# Patient Record
Sex: Male | Born: 1950 | Race: White | Hispanic: No | Marital: Married | Smoking: Former smoker
Health system: Southern US, Community
[De-identification: ages and names within clinical notes are randomized; demographics above are authoritative.]

## PROBLEM LIST (undated history)

## (undated) DIAGNOSIS — S12090A Other displaced fracture of first cervical vertebra, initial encounter for closed fracture: Secondary | ICD-10-CM

## (undated) DIAGNOSIS — K567 Ileus, unspecified: Secondary | ICD-10-CM

## (undated) DIAGNOSIS — G250 Essential tremor: Secondary | ICD-10-CM

## (undated) DIAGNOSIS — Z96649 Presence of unspecified artificial hip joint: Secondary | ICD-10-CM

## (undated) HISTORY — PX: JOINT REPLACEMENT: SHX530

## (undated) HISTORY — PX: MOUTH SURGERY: SHX715

## (undated) HISTORY — PX: COLONOSCOPY W/ POLYPECTOMY: SHX1380

## (undated) HISTORY — PX: APPENDECTOMY: SHX54

---

## 2007-06-21 ENCOUNTER — Inpatient Hospital Stay: Payer: Self-pay | Admitting: Surgery

## 2010-12-17 ENCOUNTER — Ambulatory Visit: Payer: Self-pay | Admitting: Internal Medicine

## 2011-02-04 ENCOUNTER — Ambulatory Visit: Payer: Self-pay | Admitting: Gastroenterology

## 2014-01-11 ENCOUNTER — Other Ambulatory Visit: Payer: Self-pay | Admitting: Neurosurgery

## 2014-01-11 DIAGNOSIS — S32009A Unspecified fracture of unspecified lumbar vertebra, initial encounter for closed fracture: Secondary | ICD-10-CM

## 2014-01-14 ENCOUNTER — Ambulatory Visit
Admission: RE | Admit: 2014-01-14 | Discharge: 2014-01-14 | Disposition: A | Discharge status: Home / Self Care | Payer: 59 | Source: Ambulatory Visit | Attending: Neurosurgery | Admitting: Neurosurgery

## 2014-01-14 DIAGNOSIS — S32009A Unspecified fracture of unspecified lumbar vertebra, initial encounter for closed fracture: Secondary | ICD-10-CM

## 2017-02-13 ENCOUNTER — Encounter (INDEPENDENT_AMBULATORY_CARE_PROVIDER_SITE_OTHER): Payer: Medicare Other | Admitting: Ophthalmology

## 2017-02-13 DIAGNOSIS — H2513 Age-related nuclear cataract, bilateral: Secondary | ICD-10-CM | POA: Diagnosis not present

## 2017-02-13 DIAGNOSIS — H35341 Macular cyst, hole, or pseudohole, right eye: Secondary | ICD-10-CM | POA: Diagnosis not present

## 2017-02-13 DIAGNOSIS — H33303 Unspecified retinal break, bilateral: Secondary | ICD-10-CM | POA: Diagnosis not present

## 2017-02-13 DIAGNOSIS — H43813 Vitreous degeneration, bilateral: Secondary | ICD-10-CM

## 2017-02-13 DIAGNOSIS — H35411 Lattice degeneration of retina, right eye: Secondary | ICD-10-CM | POA: Diagnosis not present

## 2017-03-03 ENCOUNTER — Encounter (INDEPENDENT_AMBULATORY_CARE_PROVIDER_SITE_OTHER): Payer: Medicare Other | Admitting: Ophthalmology

## 2017-03-03 DIAGNOSIS — H33301 Unspecified retinal break, right eye: Secondary | ICD-10-CM

## 2017-03-03 NOTE — H&P (Addendum)
Nicolas GallusRichard C Solis is an 66 y.o. male.   Chief Complaint: Loss of vision right eye  HPI:  Loss of vision 6 weeks ago in right eye  Retinal break left eye  No past medical history on file.  No past surgical history on file.  No family history on file. Social History:  has no tobacco, alcohol, and drug history on file.  Allergies: Allergies not on file  No prescriptions prior to admission.    Review of systems otherwise negative  There were no vitals taken for this visit.  Physical exam: Mental status: oriented x3. Eyes: See eye exam associated with this date of surgery in media tab.  Scanned in by scanning center Ears, Nose, Throat: within normal limits Neck: Within Normal limits General: within normal limits Chest: Within normal limits Breast: deferred Heart: Within normal limits Abdomen: Within normal limits GU: deferred Extremities: within normal limits Skin: within normal limits  Assessment/Plan Diagnosis:  Macular hole right eye.  Retinal break left eye  [facility]  Laser for retinal break left eye.  Pars plana vitrectomy, laser, membrane peel, serum patch, gas injection right eye Nicolas GeorgeMATTHEWS, Nicolas Solis 03/03/2017, 4:57 PM

## 2017-03-17 ENCOUNTER — Encounter (HOSPITAL_COMMUNITY): Payer: Self-pay | Admitting: *Deleted

## 2017-03-17 MED ORDER — CEFAZOLIN SODIUM-DEXTROSE 2-4 GM/100ML-% IV SOLN
2.0000 g | INTRAVENOUS | Status: AC
  Administered 2017-03-18: 2 g via INTRAVENOUS
  Filled 2017-03-17 (×2): qty 100

## 2017-03-18 ENCOUNTER — Ambulatory Visit (HOSPITAL_COMMUNITY): Payer: Medicare Other | Admitting: Certified Registered"

## 2017-03-18 ENCOUNTER — Ambulatory Visit (HOSPITAL_COMMUNITY)
Admission: RE | Admit: 2017-03-18 | Discharge: 2017-03-19 | Disposition: A | Discharge status: Home / Self Care | Payer: Medicare Other | Source: Ambulatory Visit | Attending: Ophthalmology | Admitting: Ophthalmology

## 2017-03-18 ENCOUNTER — Encounter (HOSPITAL_COMMUNITY)
Admission: RE | Disposition: A | Discharge status: Home / Self Care | Payer: Self-pay | Source: Ambulatory Visit | Attending: Ophthalmology

## 2017-03-18 ENCOUNTER — Encounter (HOSPITAL_COMMUNITY): Payer: Self-pay | Admitting: *Deleted

## 2017-03-18 DIAGNOSIS — H35341 Macular cyst, hole, or pseudohole, right eye: Secondary | ICD-10-CM | POA: Diagnosis present

## 2017-03-18 DIAGNOSIS — H33303 Unspecified retinal break, bilateral: Secondary | ICD-10-CM | POA: Diagnosis not present

## 2017-03-18 HISTORY — PX: GAS/FLUID EXCHANGE: SHX5334

## 2017-03-18 HISTORY — PX: GAS INSERTION: SHX5336

## 2017-03-18 HISTORY — DX: Other displaced fracture of first cervical vertebra, initial encounter for closed fracture: S12.090A

## 2017-03-18 HISTORY — PX: 25 GAUGE PARS PLANA VITRECTOMY WITH 20 GAUGE MVR PORT: SHX6041

## 2017-03-18 HISTORY — PX: PHOTOCOAGULATION: SHX5303

## 2017-03-18 HISTORY — DX: Essential tremor: G25.0

## 2017-03-18 HISTORY — PX: MEMBRANE PEEL: SHX5967

## 2017-03-18 HISTORY — DX: Ileus, unspecified: K56.7

## 2017-03-18 HISTORY — PX: 25 GAUGE PARS PLANA VITRECTOMY WITH 20 GAUGE MVR PORT FOR MACULAR HOLE: SHX6096

## 2017-03-18 HISTORY — PX: SERUM PATCH: SHX6091

## 2017-03-18 LAB — CBC
HEMATOCRIT: 43.1 % (ref 39.0–52.0)
HEMOGLOBIN: 14.2 g/dL (ref 13.0–17.0)
MCH: 33.9 pg (ref 26.0–34.0)
MCHC: 32.9 g/dL (ref 30.0–36.0)
MCV: 102.9 fL — ABNORMAL HIGH (ref 78.0–100.0)
Platelets: 138 10*3/uL — ABNORMAL LOW (ref 150–400)
RBC: 4.19 MIL/uL — AB (ref 4.22–5.81)
RDW: 13.2 % (ref 11.5–15.5)
WBC: 4.6 10*3/uL (ref 4.0–10.5)

## 2017-03-18 LAB — BASIC METABOLIC PANEL
ANION GAP: 6 (ref 5–15)
BUN: 10 mg/dL (ref 6–20)
CHLORIDE: 105 mmol/L (ref 101–111)
CO2: 26 mmol/L (ref 22–32)
CREATININE: 0.68 mg/dL (ref 0.61–1.24)
Calcium: 9.2 mg/dL (ref 8.9–10.3)
GFR calc non Af Amer: >60 mL/min (ref >60)
Glucose, Bld: 114 mg/dL — ABNORMAL HIGH (ref 65–99)
POTASSIUM: 4.1 mmol/L (ref 3.5–5.1)
Sodium: 137 mmol/L (ref 135–145)

## 2017-03-18 LAB — AUTOLOGOUS SERUM PATCH PREP

## 2017-03-18 SURGERY — 25 GAUGE PARS PLANA VITRECTOMY WITH 20 GAUGE MVR PORT FOR MACULAR HOLE
Anesthesia: General | Site: Eye | Laterality: Right

## 2017-03-18 MED ORDER — MIDAZOLAM HCL 5 MG/5ML IJ SOLN
INTRAMUSCULAR | Status: DC | PRN
  Administered 2017-03-18: 2 mg via INTRAVENOUS

## 2017-03-18 MED ORDER — HYDROMORPHONE HCL 1 MG/ML IJ SOLN: 0.2500 mg | INTRAMUSCULAR | Status: DC | PRN

## 2017-03-18 MED ORDER — SUGAMMADEX SODIUM 200 MG/2ML IV SOLN
INTRAVENOUS | Status: AC
  Filled 2017-03-18: qty 2

## 2017-03-18 MED ORDER — MAGNESIUM HYDROXIDE 400 MG/5ML PO SUSP: 15.0000 mL | Freq: Four times a day (QID) | ORAL | Status: DC | PRN

## 2017-03-18 MED ORDER — BUPIVACAINE HCL (PF) 0.75 % IJ SOLN
INTRAMUSCULAR | Status: DC | PRN
  Administered 2017-03-18: 10 mL

## 2017-03-18 MED ORDER — ONDANSETRON HCL 4 MG/2ML IJ SOLN: 4.0000 mg | Freq: Four times a day (QID) | INTRAMUSCULAR | Status: DC | PRN

## 2017-03-18 MED ORDER — BACITRACIN-POLYMYXIN B 500-10000 UNIT/GM OP OINT
TOPICAL_OINTMENT | OPHTHALMIC | Status: AC
  Filled 2017-03-18: qty 3.5

## 2017-03-18 MED ORDER — 0.9 % SODIUM CHLORIDE (POUR BTL) OPTIME
TOPICAL | Status: DC | PRN
  Administered 2017-03-18: 200 mL

## 2017-03-18 MED ORDER — DEXAMETHASONE SODIUM PHOSPHATE 10 MG/ML IJ SOLN
INTRAMUSCULAR | Status: DC | PRN
  Administered 2017-03-18: 10 mg

## 2017-03-18 MED ORDER — TROPICAMIDE 1 % OP SOLN: 1.0000 [drp] | OPHTHALMIC | Status: DC | PRN

## 2017-03-18 MED ORDER — ONDANSETRON HCL 4 MG/2ML IJ SOLN
INTRAMUSCULAR | Status: AC
  Filled 2017-03-18: qty 2

## 2017-03-18 MED ORDER — ACETAZOLAMIDE SODIUM 500 MG IJ SOLR
500.0000 mg | Freq: Once | INTRAMUSCULAR | Status: AC
  Administered 2017-03-19: 500 mg via INTRAVENOUS
  Filled 2017-03-18: qty 500

## 2017-03-18 MED ORDER — MORPHINE SULFATE (PF) 4 MG/ML IV SOLN
1.0000 mg | INTRAVENOUS | Status: DC | PRN
Start: 2017-03-18 — End: 2017-03-19

## 2017-03-18 MED ORDER — TEMAZEPAM 15 MG PO CAPS
15.0000 mg | ORAL_CAPSULE | Freq: Every evening | ORAL | Status: DC | PRN
  Administered 2017-03-18: 15 mg via ORAL
  Filled 2017-03-18: qty 1

## 2017-03-18 MED ORDER — HEMOSTATIC AGENTS (NO CHARGE) OPTIME
TOPICAL | Status: DC | PRN
  Administered 2017-03-18: 1 via TOPICAL

## 2017-03-18 MED ORDER — BUPIVACAINE HCL (PF) 0.75 % IJ SOLN
INTRAMUSCULAR | Status: AC
  Filled 2017-03-18: qty 10

## 2017-03-18 MED ORDER — STERILE WATER FOR INJECTION IJ SOLN
INTRAMUSCULAR | Status: AC
  Filled 2017-03-18: qty 20

## 2017-03-18 MED ORDER — PHENYLEPHRINE HCL 2.5 % OP SOLN
1.0000 [drp] | OPHTHALMIC | Status: AC
  Administered 2017-03-18 (×3): 1 [drp] via OPHTHALMIC
  Filled 2017-03-18: qty 2

## 2017-03-18 MED ORDER — BSS IO SOLN
INTRAOCULAR | Status: AC
  Filled 2017-03-18: qty 15

## 2017-03-18 MED ORDER — EPHEDRINE SULFATE-NACL 50-0.9 MG/10ML-% IV SOSY
PREFILLED_SYRINGE | INTRAVENOUS | Status: DC | PRN
  Administered 2017-03-18 (×2): 10 mg via INTRAVENOUS

## 2017-03-18 MED ORDER — EPINEPHRINE PF 1 MG/ML IJ SOLN
INTRAMUSCULAR | Status: AC
  Filled 2017-03-18: qty 1

## 2017-03-18 MED ORDER — MIDAZOLAM HCL 2 MG/2ML IJ SOLN
INTRAMUSCULAR | Status: AC
  Filled 2017-03-18: qty 2

## 2017-03-18 MED ORDER — TRIAMCINOLONE ACETONIDE 40 MG/ML IJ SUSP
INTRAMUSCULAR | Status: AC
  Filled 2017-03-18: qty 5

## 2017-03-18 MED ORDER — SODIUM CHLORIDE 0.9 % IJ SOLN
INTRAMUSCULAR | Status: AC
  Filled 2017-03-18: qty 10

## 2017-03-18 MED ORDER — ATROPINE SULFATE 1 % OP SOLN
OPHTHALMIC | Status: DC | PRN
  Administered 2017-03-18: 1 [drp] via OPHTHALMIC

## 2017-03-18 MED ORDER — EPINEPHRINE PF 1 MG/ML IJ SOLN
INTRAOCULAR | Status: DC | PRN
  Administered 2017-03-18: 11:00:00

## 2017-03-18 MED ORDER — LIDOCAINE 2% (20 MG/ML) 5 ML SYRINGE
INTRAMUSCULAR | Status: DC | PRN
  Administered 2017-03-18: 80 mg via INTRAVENOUS

## 2017-03-18 MED ORDER — SODIUM CHLORIDE 0.45 % IV SOLN
INTRAVENOUS | Status: DC
  Administered 2017-03-18: 1 mL via INTRAVENOUS

## 2017-03-18 MED ORDER — ROCURONIUM BROMIDE 10 MG/ML (PF) SYRINGE
PREFILLED_SYRINGE | INTRAVENOUS | Status: DC | PRN
  Administered 2017-03-18: 40 mg via INTRAVENOUS

## 2017-03-18 MED ORDER — CEFTAZIDIME 1 G IJ SOLR
INTRAMUSCULAR | Status: AC
  Filled 2017-03-18: qty 1

## 2017-03-18 MED ORDER — TETRACAINE HCL 0.5 % OP SOLN
2.0000 [drp] | Freq: Once | OPHTHALMIC | Status: DC
  Filled 2017-03-18: qty 4

## 2017-03-18 MED ORDER — CYCLOPENTOLATE HCL 1 % OP SOLN
1.0000 [drp] | OPHTHALMIC | Status: AC
  Administered 2017-03-18 (×3): 1 [drp] via OPHTHALMIC
  Filled 2017-03-18: qty 2

## 2017-03-18 MED ORDER — ATROPINE SULFATE 1 % OP SOLN
OPHTHALMIC | Status: AC
  Filled 2017-03-18: qty 5

## 2017-03-18 MED ORDER — STERILE WATER FOR IRRIGATION IR SOLN
Status: DC | PRN
  Administered 2017-03-18: 200 mL

## 2017-03-18 MED ORDER — FENTANYL CITRATE (PF) 250 MCG/5ML IJ SOLN
INTRAMUSCULAR | Status: DC | PRN
  Administered 2017-03-18: 100 ug via INTRAVENOUS

## 2017-03-18 MED ORDER — PHENYLEPHRINE 40 MCG/ML (10ML) SYRINGE FOR IV PUSH (FOR BLOOD PRESSURE SUPPORT)
PREFILLED_SYRINGE | INTRAVENOUS | Status: DC | PRN
  Administered 2017-03-18: 80 ug via INTRAVENOUS
  Administered 2017-03-18: 120 ug via INTRAVENOUS
  Administered 2017-03-18 (×3): 80 ug via INTRAVENOUS
  Administered 2017-03-18: 120 ug via INTRAVENOUS
  Administered 2017-03-18: 80 ug via INTRAVENOUS

## 2017-03-18 MED ORDER — PREDNISOLONE ACETATE 1 % OP SUSP
1.0000 [drp] | Freq: Four times a day (QID) | OPHTHALMIC | Status: DC
  Filled 2017-03-18: qty 5

## 2017-03-18 MED ORDER — DEXAMETHASONE SODIUM PHOSPHATE 10 MG/ML IJ SOLN
INTRAMUSCULAR | Status: AC
  Filled 2017-03-18: qty 1

## 2017-03-18 MED ORDER — ROCURONIUM BROMIDE 10 MG/ML (PF) SYRINGE
PREFILLED_SYRINGE | INTRAVENOUS | Status: AC
  Filled 2017-03-18: qty 5

## 2017-03-18 MED ORDER — BRIMONIDINE TARTRATE 0.2 % OP SOLN
1.0000 [drp] | Freq: Two times a day (BID) | OPHTHALMIC | Status: DC
  Filled 2017-03-18: qty 5

## 2017-03-18 MED ORDER — PROPOFOL 10 MG/ML IV BOLUS
INTRAVENOUS | Status: AC
  Filled 2017-03-18: qty 20

## 2017-03-18 MED ORDER — PROPOFOL 10 MG/ML IV BOLUS
INTRAVENOUS | Status: DC | PRN
  Administered 2017-03-18: 150 mg via INTRAVENOUS

## 2017-03-18 MED ORDER — GATIFLOXACIN 0.5 % OP SOLN
1.0000 [drp] | OPHTHALMIC | Status: AC
  Administered 2017-03-18 (×3): 1 [drp] via OPHTHALMIC
  Filled 2017-03-18: qty 2.5

## 2017-03-18 MED ORDER — BSS PLUS IO SOLN
INTRAOCULAR | Status: AC
  Filled 2017-03-18: qty 500

## 2017-03-18 MED ORDER — FENTANYL CITRATE (PF) 250 MCG/5ML IJ SOLN
INTRAMUSCULAR | Status: AC
  Filled 2017-03-18: qty 5

## 2017-03-18 MED ORDER — POLYMYXIN B SULFATE 500000 UNITS IJ SOLR
INTRAMUSCULAR | Status: AC
  Filled 2017-03-18: qty 500000

## 2017-03-18 MED ORDER — LATANOPROST 0.005 % OP SOLN
1.0000 [drp] | Freq: Every day | OPHTHALMIC | Status: DC
  Administered 2017-03-18: 1 [drp] via OPHTHALMIC
  Filled 2017-03-18: qty 2.5

## 2017-03-18 MED ORDER — SODIUM HYALURONATE 10 MG/ML IO SOLN
INTRAOCULAR | Status: AC
  Filled 2017-03-18: qty 0.85

## 2017-03-18 MED ORDER — GATIFLOXACIN 0.5 % OP SOLN: 1.0000 [drp] | OPHTHALMIC | Status: DC | PRN

## 2017-03-18 MED ORDER — BSS PLUS IO SOLN: INTRAOCULAR | Status: DC | PRN

## 2017-03-18 MED ORDER — CYCLOPENTOLATE HCL 1 % OP SOLN: 1.0000 [drp] | OPHTHALMIC | Status: DC | PRN

## 2017-03-18 MED ORDER — IBUPROFEN 600 MG PO TABS: 600.0000 mg | ORAL_TABLET | Freq: Every day | ORAL | Status: DC | PRN

## 2017-03-18 MED ORDER — LIDOCAINE 2% (20 MG/ML) 5 ML SYRINGE
INTRAMUSCULAR | Status: AC
  Filled 2017-03-18: qty 5

## 2017-03-18 MED ORDER — SODIUM HYALURONATE 10 MG/ML IO SOLN
INTRAOCULAR | Status: DC | PRN
  Administered 2017-03-18: 0.85 mL via INTRAOCULAR

## 2017-03-18 MED ORDER — ONDANSETRON HCL 4 MG/2ML IJ SOLN
INTRAMUSCULAR | Status: DC | PRN
  Administered 2017-03-18: 4 mg via INTRAVENOUS

## 2017-03-18 MED ORDER — PHENYLEPHRINE 40 MCG/ML (10ML) SYRINGE FOR IV PUSH (FOR BLOOD PRESSURE SUPPORT)
PREFILLED_SYRINGE | INTRAVENOUS | Status: AC
Start: 2017-03-18 — End: ?
  Filled 2017-03-18: qty 10

## 2017-03-18 MED ORDER — BACITRACIN-POLYMYXIN B 500-10000 UNIT/GM OP OINT
1.0000 "application " | TOPICAL_OINTMENT | Freq: Three times a day (TID) | OPHTHALMIC | Status: DC
  Filled 2017-03-18: qty 3.5

## 2017-03-18 MED ORDER — DORZOLAMIDE HCL 2 % OP SOLN
1.0000 [drp] | Freq: Three times a day (TID) | OPHTHALMIC | Status: DC
  Administered 2017-03-19: 1 [drp] via OPHTHALMIC
  Filled 2017-03-18: qty 10

## 2017-03-18 MED ORDER — BACITRACIN-POLYMYXIN B 500-10000 UNIT/GM OP OINT
TOPICAL_OINTMENT | OPHTHALMIC | Status: DC | PRN
  Administered 2017-03-18: 1 via OPHTHALMIC

## 2017-03-18 MED ORDER — STERILE WATER FOR INJECTION IJ SOLN
INTRAMUSCULAR | Status: DC | PRN
  Administered 2017-03-18: 20 mL

## 2017-03-18 MED ORDER — HYDROCODONE-ACETAMINOPHEN 5-325 MG PO TABS: 1.0000 | ORAL_TABLET | ORAL | Status: DC | PRN

## 2017-03-18 MED ORDER — GATIFLOXACIN 0.5 % OP SOLN
1.0000 [drp] | Freq: Four times a day (QID) | OPHTHALMIC | Status: DC
  Filled 2017-03-18: qty 2.5

## 2017-03-18 MED ORDER — ACETAMINOPHEN 325 MG PO TABS: 325.0000 mg | ORAL_TABLET | ORAL | Status: DC | PRN

## 2017-03-18 MED ORDER — PRIMIDONE 50 MG PO TABS
50.0000 mg | ORAL_TABLET | Freq: Every evening | ORAL | Status: DC
  Administered 2017-03-18: 50 mg via ORAL
  Filled 2017-03-18: qty 1

## 2017-03-18 MED ORDER — LATANOPROST 0.005 % OP SOLN
1.0000 [drp] | Freq: Every day | OPHTHALMIC | Status: DC
  Administered 2017-03-19: 1 [drp] via OPHTHALMIC
  Filled 2017-03-18: qty 2.5

## 2017-03-18 MED ORDER — SODIUM CHLORIDE 0.9 % IV SOLN
INTRAVENOUS | Status: DC
  Administered 2017-03-18: 10:00:00 via INTRAVENOUS

## 2017-03-18 MED ORDER — TROPICAMIDE 1 % OP SOLN
1.0000 [drp] | OPHTHALMIC | Status: AC
  Administered 2017-03-18 – 2017-03-19 (×4): 1 [drp] via OPHTHALMIC
  Filled 2017-03-18: qty 15

## 2017-03-18 MED ORDER — PHENYLEPHRINE HCL 2.5 % OP SOLN: 1.0000 [drp] | OPHTHALMIC | Status: DC | PRN

## 2017-03-18 MED ORDER — SUGAMMADEX SODIUM 200 MG/2ML IV SOLN
INTRAVENOUS | Status: DC | PRN
  Administered 2017-03-18: 200 mg via INTRAVENOUS

## 2017-03-18 SURGICAL SUPPLY — 63 items
BIPOLAR FORCEP ×4 IMPLANT
BLADE EYE CATARACT 19 1.4 BEAV (BLADE) IMPLANT
BLADE MVR KNIFE 19G (BLADE) IMPLANT
BLADE MVR KNIFE 20G (BLADE) ×4 IMPLANT
CANNULA VLV SOFT TIP 25GA (OPHTHALMIC) ×4 IMPLANT
CORDS BIPOLAR (ELECTRODE) ×4 IMPLANT
COTTONBALL LRG STERILE PKG (GAUZE/BANDAGES/DRESSINGS) ×12 IMPLANT
COVER MAYO STAND STRL (DRAPES) IMPLANT
DRAPE INCISE 51X51 W/FILM STRL (DRAPES) IMPLANT
DRAPE OPHTHALMIC 77X100 STRL (CUSTOM PROCEDURE TRAY) ×4 IMPLANT
ERASER HMR WETFIELD 23G BP (MISCELLANEOUS) ×4 IMPLANT
FILTER BLUE MILLIPORE (MISCELLANEOUS) IMPLANT
FILTER STRAW FLUID ASPIR (MISCELLANEOUS) ×4 IMPLANT
FORCEPS GRIESHABER ILM 25G A (INSTRUMENTS) IMPLANT
GAS AUTO FILL CONSTEL (OPHTHALMIC) ×4
GAS AUTO FILL CONSTELLATION (OPHTHALMIC) ×2 IMPLANT
GAS OPHTHALMIC (MISCELLANEOUS) ×4 IMPLANT
GLOVE SS BIOGEL STRL SZ 6.5 (GLOVE) ×2 IMPLANT
GLOVE SS BIOGEL STRL SZ 7 (GLOVE) ×2 IMPLANT
GLOVE SUPERSENSE BIOGEL SZ 6.5 (GLOVE) ×2
GLOVE SUPERSENSE BIOGEL SZ 7 (GLOVE) ×2
GLOVE SURG 8.5 LATEX PF (GLOVE) ×4 IMPLANT
GLOVE SURG SS PI 6.0 STRL IVOR (GLOVE) ×8 IMPLANT
GOWN STRL REUS W/ TWL LRG LVL3 (GOWN DISPOSABLE) ×6 IMPLANT
GOWN STRL REUS W/TWL LRG LVL3 (GOWN DISPOSABLE) ×6
HANDLE PNEUMATIC FOR CONSTEL (OPHTHALMIC) IMPLANT
KIT BASIN OR (CUSTOM PROCEDURE TRAY) ×4 IMPLANT
KNIFE GRIESHABER SHARP 2.5MM (MISCELLANEOUS) IMPLANT
MICROPICK 25G (MISCELLANEOUS)
NEEDLE 18GX1X1/2 (RX/OR ONLY) (NEEDLE) ×4 IMPLANT
NEEDLE 25GX 5/8IN NON SAFETY (NEEDLE) ×4 IMPLANT
NEEDLE FILTER BLUNT 18X 1/2SAF (NEEDLE) ×2
NEEDLE FILTER BLUNT 18X1 1/2 (NEEDLE) ×2 IMPLANT
NEEDLE HYPO 30X.5 LL (NEEDLE) IMPLANT
NS IRRIG 1000ML POUR BTL (IV SOLUTION) ×4 IMPLANT
PACK FRAGMATOME (OPHTHALMIC) IMPLANT
PACK VITRECTOMY CUSTOM (CUSTOM PROCEDURE TRAY) ×4 IMPLANT
PAD ARMBOARD 7.5X6 YLW CONV (MISCELLANEOUS) ×8 IMPLANT
PAK PIK VITRECTOMY CVS 25GA (OPHTHALMIC) ×4 IMPLANT
PIC ILLUMINATED 25G (OPHTHALMIC) ×4
PICK MICROPICK 25G (MISCELLANEOUS) IMPLANT
PIK ILLUMINATED 25G (OPHTHALMIC) ×2 IMPLANT
PROBE LASER ILLUM FLEX CVD 25G (OPHTHALMIC) IMPLANT
REPL STRA BRUSH NEEDLE (NEEDLE) ×4 IMPLANT
RESERVOIR BACK FLUSH (MISCELLANEOUS) ×4 IMPLANT
ROLLS DENTAL (MISCELLANEOUS) ×8 IMPLANT
SCRAPER DIAMOND 25GA (OPHTHALMIC RELATED) ×4 IMPLANT
SCRAPER DIAMOND DUST MEMBRANE (MISCELLANEOUS) ×4 IMPLANT
SPONGE SURGIFOAM ABS GEL 12-7 (HEMOSTASIS) ×4 IMPLANT
STOPCOCK 4 WAY LG BORE MALE ST (IV SETS) IMPLANT
SUT CHROMIC 7 0 TG140 8 (SUTURE) IMPLANT
SUT ETHILON 9 0 TG140 8 (SUTURE) ×4 IMPLANT
SUT POLY NON ABSORB 10-0 8 STR (SUTURE) IMPLANT
SUT SILK 4 0 RB 1 (SUTURE) IMPLANT
SUT VICRYL 7 0 TG140 8 (SUTURE) ×4 IMPLANT
SYR 10ML LL (SYRINGE) ×4 IMPLANT
SYR 20CC LL (SYRINGE) IMPLANT
SYR 5ML LL (SYRINGE) IMPLANT
SYR BULB 3OZ (MISCELLANEOUS) ×4 IMPLANT
SYR TB 1ML LUER SLIP (SYRINGE) ×4 IMPLANT
TUBING HIGH PRESS EXTEN 6IN (TUBING) IMPLANT
WATER STERILE IRR 1000ML POUR (IV SOLUTION) ×4 IMPLANT
WIPE INSTRUMENT VISIWIPE 73X73 (MISCELLANEOUS) IMPLANT

## 2017-03-18 NOTE — Anesthesia Preprocedure Evaluation (Addendum)
Anesthesia Evaluation  Patient identified by MRN, date of birth, ID band Patient awake    Reviewed: Allergy & Precautions, NPO status , Patient's Chart, lab work & pertinent test results  Airway Mallampati: II  TM Distance: >3 FB     Dental   Pulmonary former smoker,    breath sounds clear to auscultation       Cardiovascular negative cardio ROS   Rhythm:Regular Rate:Normal     Neuro/Psych    GI/Hepatic negative GI ROS, Neg liver ROS,   Endo/Other  negative endocrine ROS  Renal/GU negative Renal ROS  negative genitourinary   Musculoskeletal   Abdominal   Peds  Hematology   Anesthesia Other Findings   Reproductive/Obstetrics                             Anesthesia Physical Anesthesia Plan  ASA: III  Anesthesia Plan: General   Post-op Pain Management:    Induction: Intravenous  PONV Risk Score and Plan: 2 and Ondansetron, Dexamethasone, Propofol infusion, Midazolam and Treatment may vary due to age or medical condition  Airway Management Planned: Simple Face Mask  Additional Equipment:   Intra-op Plan:   Post-operative Plan: Extubation in OR  Informed Consent: I have reviewed the patients History and Physical, chart, labs and discussed the procedure including the risks, benefits and alternatives for the proposed anesthesia with the patient or authorized representative who has indicated his/her understanding and acceptance.   Dental advisory given  Plan Discussed with: CRNA and Anesthesiologist  Anesthesia Plan Comments:         Anesthesia Quick Evaluation

## 2017-03-18 NOTE — Brief Op Note (Signed)
Brief Operative note   Preoperative diagnosis:  macular hole right eye retinal break left eye Postoperative diagnosis  Same  Procedures: Laser for retinal breaks OU.  Pars plana vitrectomy, membrane peel, laser, gas injection, serum patch  Right eye  Surgeon:  Sherrie George, MD...  Assistant:  Rosalie Doctor SA    Anesthesia: General  Specimen: none  Estimated blood loss:  1cc  Complications: none  Patient sent to PACU in good condition  Composed by Sherrie George MD  Dictation number: [id number]

## 2017-03-18 NOTE — H&P (Signed)
I examined the patient today and there is no change in the medical status 

## 2017-03-18 NOTE — Op Note (Signed)
NAME:  Nicolas Solis, Nicolas Solis                 ACCOUNT NO.:  MEDICAL RECORD NO.:  [id number]  LOCATION:                                 FACILITY:  PHYSICIAN:  Jarett Dralle D. Ashley Royalty, M.D.      DATE OF BIRTH:  DATE OF PROCEDURE:  03/18/2017 DATE OF DISCHARGE:                              OPERATIVE REPORT   ADMISSION DIAGNOSES:  Retinal break with subretinal fluid, left eye. Retinal break with subretinal fluid, right eye.  Macular hole, right eye.  PROCEDURES:  Laser for retinal break, left eye; laser for retinal break, right eye; pars plana vitrectomy, membrane peel, gas-fluid exchange, serum patch, retinal photocoagulation, all in the right eye.  SURGEON:  Beulah Gandy. Ashley Royalty, M.D.  ASSISTANT:  Rosalie Doctor, SA.  ANESTHESIA:  General.  DETAILS:  After proper endotracheal anesthesia, attention was carried to the left eye.  The 962 burns were placed around the retinal periphery around areas of retinal breaks with some subretinal fluid.  These areas were primarily at 12 o'clock and at 1 o'clock and at 2 o'clock.  The power was between 500 and 780 mW, 1000 microns each and 0.1 seconds each.  The eye was treated with ointment and closed.  It was covered. Attention was carried to the right eye.  Indirect ophthalmoscope laser was moved into place.  The 957 burns were placed around the retinal periphery.  An area of shallow detachment was seen superiorly and 957 burns were placed around the retinal breaks and in 2 rows of laser for 360 degrees.  Attention was carried to the pars plana where the pars plana vitrectomy was begun.  A 25-gauge trocars at 8 o'clock and 10 o'clock.  A 3-layered scleral incision was made with a 20-gauge MVR at 2 o'clock.  Contact lens ring anchored into place at 6 and 12 o'clock. Provisc was placed on the corneal surface and the flat contact.  The 25- gauge trocars were placed at 8 and 10 o'clock, infusion at 8 o'clock. The pars plana vitrectomy was begun just behind the  crystalline lens. Vitreous membranes were encountered.  These were carefully removed under low suction and rapid cutting.  The vitrectomy was carried posteriorly in a core fashion down to the macular surface.  The vitrectomy was carried into the mid periphery with the 30 degree prismatic lens for viewing.  The flat lens was repositioned on the eye.  The silicone tip suction line was brought into the vitreous cavity down to the macular surface.  The fish strike sign occurred.  The edge of the macular hole elevated and the membrane was stripped from its attachment to the edge of the macular hole.  The additional vitrectomy was carried into the mid periphery and then into the far periphery with the 30-degree prismatic contact lens viewing system.  Once this was accomplished, the Provisc was repositioned on the eye and the magnifying contact lens was placed. The diamond-dusted membrane scraper, 20-gauge was inserted into the eye and drawn down to the macular surface.  The internal limiting membrane was removed with the diamond-dusted membrane scraper.  The edges of the macular hole were freed and mobile.  Once all membranes were  removed, vitreous cutter was repositioned in the eye and all remnants of vitreous were removed.  A total gas-fluid exchange was carried out.  Sufficient time was allowed to pass, so that fluid would track down the walls of the eye collecting the posterior segment.  During this time, a serum patch was prepared and C3F8 was prepared to a 14% concentration.  The additional fluid was removed with the [place] Ophthalmic brush until the posterior segment was totally dry.  Serum patch was delivered. Additional serum was removed with a [place] Ophthalmic brush.  C3F8 14% was exchanged for intravitreal gas.  The instruments were removed from the eye.  A 9-0 nylon was used to close the sclerotomy site at 2 o'clock.  Conjunctiva was closed with wet-field cautery.  The  25-gauge trocars were removed.  The wounds were tested and found to be secure. Polymyxin and ceftazidime were rinsed around the globe for antibiotic coverage.  Decadron 10 mg was injected into the lower subconjunctival space.  Atropine solution was applied.  Marcaine was injected around the globe for postop pain.  Closing pressure was 15 with a [place] tonometer.  Polysporin ophthalmic ointment, a patch and shield were placed.  The patient was awakened, taken to recovery in satisfactory condition.  DURATION:  Two hours.     Beulah Gandy. Ashley Royalty, M.D.     JDM/MEDQ  D:  03/18/2017  T:  03/18/2017  Job:  [id number]

## 2017-03-18 NOTE — Progress Notes (Signed)
Pt was instructed to lay down with his face down provided the right pillow "u shape" but the pt refused, he said his wife will bring his own pillow from home, assisted pt going to the bathroom for safety.

## 2017-03-18 NOTE — Anesthesia Procedure Notes (Signed)
Procedure Name: Intubation Date/Time: 03/18/2017 11:42 AM Performed by: Myna Bright Pre-anesthesia Checklist: Patient identified, Emergency Drugs available, Suction available and Patient being monitored Patient Re-evaluated:Patient Re-evaluated prior to induction Oxygen Delivery Method: Circle system utilized Preoxygenation: Pre-oxygenation with 100% oxygen Induction Type: IV induction Ventilation: Mask ventilation without difficulty Laryngoscope Size: Mac and 4 Grade View: Grade I Tube type: Oral Tube size: 7.5 mm Number of attempts: 1 Airway Equipment and Method: Stylet Placement Confirmation: ETT inserted through vocal cords under direct vision,  positive ETCO2 and breath sounds checked- equal and bilateral Secured at: 22 cm Tube secured with: Tape Dental Injury: Teeth and Oropharynx as per pre-operative assessment

## 2017-03-18 NOTE — Anesthesia Postprocedure Evaluation (Signed)
Anesthesia Post Note  Patient: Nicolas Solis  Procedure(s) Performed: Procedure(s) (LRB): 25 GAUGE PARS PLANA VITRECTOMY WITH 20 GAUGE MVR PORT FOR MACULAR HOLE; PHOTOCOAGULATION RIGHT EYE (Right) PHOTOCOAGULATION LEFT EYE (Left) SERUM PATCH RIGHT EYE (Right) MEMBRANE PEEL RIGHT EYE (Right) GAS/FLUID EXCHANGE RIGHT EYE (Right) INSERTION OF GAS RIGHT (C3F8) (Right)     Patient location during evaluation: PACU Anesthesia Type: General Level of consciousness: awake Pain management: pain level controlled Vital Signs Assessment: post-procedure vital signs reviewed and stable Respiratory status: spontaneous breathing Cardiovascular status: stable Postop Assessment: no signs of nausea or vomiting Anesthetic complications: no    Last Vitals:  Vitals:   03/18/17 1349 03/18/17 1404  BP:  132/79  Pulse: 75 71  Resp: 18 18  Temp: (!) 36.1 C (!) 36.3 C  SpO2: 99% 96%    Last Pain:  Vitals:   03/18/17 1404  TempSrc: Oral                 Jarrick Fjeld

## 2017-03-18 NOTE — Progress Notes (Signed)
Pt new admit from PACU s/p Pars plana Vitrectomy Rt eye with eye shield, alert and oriented with Lt hand peripheral IV line, voided, right eye dressing dry and intact, wife at the bedside.

## 2017-03-18 NOTE — Progress Notes (Signed)
Pt sit at the edge of the bed  leaning the side table face down because he said he is not comfortable lying down in the bed, wife at the bedside discussed the importance of the right position as per MD instruction.

## 2017-03-18 NOTE — Transfer of Care (Signed)
Immediate Anesthesia Transfer of Care Note  Patient: Nicolas Solis  Procedure(s) Performed: Procedure(s): 25 GAUGE PARS PLANA VITRECTOMY WITH 20 GAUGE MVR PORT FOR MACULAR HOLE (Right) PHOTOCOAGULATION (Left)  Patient Location: PACU  Anesthesia Type:General  Level of Consciousness: awake, alert , oriented and patient cooperative  Airway & Oxygen Therapy: Patient Spontanous Breathing and Patient connected to nasal cannula oxygen  Post-op Assessment: Report given to RN and Post -op Vital signs reviewed and stable  Post vital signs: Reviewed and stable  Last Vitals:  Vitals:   03/18/17 0900 03/18/17 1311  BP: 122/70   Pulse: 73   Resp: 18   Temp: 36.7 C (!) 36.2 C  SpO2: 100%     Last Pain:  Vitals:   03/18/17 0900  TempSrc: Oral         Complications: No apparent anesthesia complications

## 2017-03-19 ENCOUNTER — Encounter (HOSPITAL_COMMUNITY): Payer: Self-pay | Admitting: Ophthalmology

## 2017-03-19 DIAGNOSIS — H35341 Macular cyst, hole, or pseudohole, right eye: Secondary | ICD-10-CM | POA: Diagnosis not present

## 2017-03-19 MED ORDER — GATIFLOXACIN 0.5 % OP SOLN: 1.0000 [drp] | Freq: Four times a day (QID) | OPHTHALMIC | Status: DC

## 2017-03-19 MED ORDER — BACITRACIN-POLYMYXIN B 500-10000 UNIT/GM OP OINT: 1.0000 "application " | TOPICAL_OINTMENT | Freq: Three times a day (TID) | OPHTHALMIC | 0 refills | Status: DC

## 2017-03-19 MED ORDER — PREDNISOLONE ACETATE 1 % OP SUSP: 1.0000 [drp] | Freq: Four times a day (QID) | OPHTHALMIC | 0 refills | Status: DC

## 2017-03-19 MED ORDER — BRIMONIDINE TARTRATE 0.2 % OP SOLN: 1.0000 [drp] | Freq: Two times a day (BID) | OPHTHALMIC | 12 refills | Status: DC

## 2017-03-19 MED ORDER — HYDROCODONE-ACETAMINOPHEN 5-325 MG PO TABS: 1.0000 | ORAL_TABLET | ORAL | 0 refills | Status: DC | PRN

## 2017-03-19 MED ORDER — DIPHENHYDRAMINE HCL 25 MG PO CAPS
25.0000 mg | ORAL_CAPSULE | Freq: Once | ORAL | Status: AC
  Administered 2017-03-19: 25 mg via ORAL
  Filled 2017-03-19: qty 1

## 2017-03-19 NOTE — Progress Notes (Signed)
03/19/2017, 6:44 AM  Mental Status:  Awake, Alert, Oriented  Anterior segment: Cornea  Clear    Anterior Chamber Clear    Lens:   Clear, IOL, Cataract  Intra Ocular Pressure 45 mmHg with Tonopen  (treated, see below)  Vitreous: Clear 99%gas bubble  Retina:  Attached Good laser reaction   Impression: Excellent result Retina attached  Final Diagnosis: Active Problems:   Macular hole, right eye   Plan:  Increased IOP at bedside.  Sterile tap of vitreous with betadine, moxiflox, tetracaine.  Used insulin needle and syringe.  IOP reduced to .  Dorzolomide, Xalatan, Alphagan given OD.  start post operative eye drops.  Add Alphagan.   Discharge to home.  Give post operative instructions  Sherrie George 03/19/2017, 6:44 AM

## 2017-03-19 NOTE — Discharge Summary (Signed)
Discharge summary not needed on [facility] patients per medical records. 

## 2017-03-19 NOTE — Progress Notes (Signed)
Discharge teaching complete. Meds, diet, follow up appointments and eye care reviewed and all questions answered. Wife at bedside. Copy of instructions given to patient along with prescription.

## 2017-03-25 ENCOUNTER — Encounter (INDEPENDENT_AMBULATORY_CARE_PROVIDER_SITE_OTHER): Payer: Medicare Other | Admitting: Ophthalmology

## 2017-03-25 DIAGNOSIS — H35341 Macular cyst, hole, or pseudohole, right eye: Secondary | ICD-10-CM

## 2017-03-25 DIAGNOSIS — H33302 Unspecified retinal break, left eye: Secondary | ICD-10-CM

## 2017-04-02 ENCOUNTER — Encounter (INDEPENDENT_AMBULATORY_CARE_PROVIDER_SITE_OTHER): Payer: Medicare Other | Admitting: Ophthalmology

## 2017-04-02 DIAGNOSIS — H35341 Macular cyst, hole, or pseudohole, right eye: Secondary | ICD-10-CM

## 2017-04-17 ENCOUNTER — Encounter (INDEPENDENT_AMBULATORY_CARE_PROVIDER_SITE_OTHER): Payer: Medicare Other | Admitting: Ophthalmology

## 2017-04-17 DIAGNOSIS — H35341 Macular cyst, hole, or pseudohole, right eye: Secondary | ICD-10-CM

## 2017-06-26 ENCOUNTER — Encounter (INDEPENDENT_AMBULATORY_CARE_PROVIDER_SITE_OTHER): Payer: Medicare Other | Admitting: Ophthalmology

## 2017-06-26 DIAGNOSIS — H35372 Puckering of macula, left eye: Secondary | ICD-10-CM

## 2017-06-26 DIAGNOSIS — H43812 Vitreous degeneration, left eye: Secondary | ICD-10-CM | POA: Diagnosis not present

## 2017-06-26 DIAGNOSIS — H33303 Unspecified retinal break, bilateral: Secondary | ICD-10-CM

## 2017-06-26 DIAGNOSIS — H35341 Macular cyst, hole, or pseudohole, right eye: Secondary | ICD-10-CM

## 2017-09-25 ENCOUNTER — Encounter (INDEPENDENT_AMBULATORY_CARE_PROVIDER_SITE_OTHER): Payer: Medicare Other | Admitting: Ophthalmology

## 2017-09-25 DIAGNOSIS — H33303 Unspecified retinal break, bilateral: Secondary | ICD-10-CM

## 2017-09-25 DIAGNOSIS — H43812 Vitreous degeneration, left eye: Secondary | ICD-10-CM

## 2017-09-25 DIAGNOSIS — H35372 Puckering of macula, left eye: Secondary | ICD-10-CM

## 2017-09-25 DIAGNOSIS — H35341 Macular cyst, hole, or pseudohole, right eye: Secondary | ICD-10-CM

## 2017-09-25 DIAGNOSIS — H2513 Age-related nuclear cataract, bilateral: Secondary | ICD-10-CM | POA: Diagnosis not present

## 2018-09-25 ENCOUNTER — Encounter (INDEPENDENT_AMBULATORY_CARE_PROVIDER_SITE_OTHER): Payer: Medicare Other | Admitting: Ophthalmology

## 2019-09-18 ENCOUNTER — Ambulatory Visit: Payer: Medicare Other | Attending: Internal Medicine

## 2019-09-18 DIAGNOSIS — Z23 Encounter for immunization: Secondary | ICD-10-CM | POA: Insufficient documentation

## 2019-09-18 NOTE — Progress Notes (Signed)
   Covid-19 Vaccination Clinic  Name:  Nicolas Solis    MRN: [id number] DOB: [birth year]  09/18/2019  Mr. Nicolas Solis was observed post Covid-19 immunization for 15 minutes without incidence. He was provided with Vaccine Information Sheet and instruction to access the V-Safe system.   Mr. Nicolas Solis was instructed to call 911 with any severe reactions post vaccine: Marland Kitchen Difficulty breathing  . Swelling of your face and throat  . A fast heartbeat  . A bad rash all over your body  . Dizziness and weakness    Immunizations Administered    Name Date Dose VIS Date Route   Pfizer COVID-19 Vaccine 09/18/2019 12:31 PM 0.3 mL 07/09/2019 Intramuscular   Manufacturer: [employer], [employer]   Lot: [id number]   NDC: [address]-8

## 2019-10-12 ENCOUNTER — Ambulatory Visit: Payer: Medicare Other | Attending: Internal Medicine

## 2019-10-12 DIAGNOSIS — Z23 Encounter for immunization: Secondary | ICD-10-CM

## 2019-10-12 NOTE — Progress Notes (Signed)
   Covid-19 Vaccination Clinic  Name:  CASSIDY TABET    MRN: [id number] DOB: 05/22/1951  10/12/2019  Mr. Boley was observed post Covid-19 immunization for 15 minutes without incident. He was provided with Vaccine Information Sheet and instruction to access the V-Safe system.   Mr. Pugh was instructed to call 911 with any severe reactions post vaccine: Marland Kitchen Difficulty breathing  . Swelling of face and throat  . A fast heartbeat  . A bad rash all over body  . Dizziness and weakness   Immunizations Administered    Name Date Dose VIS Date Route   Pfizer COVID-19 Vaccine 10/12/2019 11:28 AM 0.3 mL 07/09/2019 Intramuscular   Manufacturer: [employer], [employer]   Lot: [id number]   NDC: [address]-9

## 2020-06-02 ENCOUNTER — Other Ambulatory Visit: Payer: Self-pay | Admitting: Neurology

## 2020-06-02 ENCOUNTER — Other Ambulatory Visit (HOSPITAL_COMMUNITY): Payer: Self-pay | Admitting: Neurology

## 2020-06-02 DIAGNOSIS — G20 Parkinson's disease: Secondary | ICD-10-CM

## 2020-06-08 ENCOUNTER — Ambulatory Visit (HOSPITAL_COMMUNITY)
Admission: RE | Admit: 2020-06-08 | Discharge: 2020-06-08 | Disposition: A | Discharge status: Home / Self Care | Payer: Medicare Other | Source: Ambulatory Visit | Attending: Neurology | Admitting: Neurology

## 2020-06-08 ENCOUNTER — Other Ambulatory Visit: Payer: Self-pay

## 2020-06-08 ENCOUNTER — Other Ambulatory Visit (HOSPITAL_COMMUNITY): Payer: Self-pay | Admitting: Neurology

## 2020-06-08 DIAGNOSIS — G20 Parkinson's disease: Secondary | ICD-10-CM

## 2020-09-25 ENCOUNTER — Other Ambulatory Visit: Payer: Self-pay | Admitting: Internal Medicine

## 2020-09-25 DIAGNOSIS — Z136 Encounter for screening for cardiovascular disorders: Secondary | ICD-10-CM

## 2020-09-25 DIAGNOSIS — I700 Atherosclerosis of aorta: Secondary | ICD-10-CM

## 2020-09-29 ENCOUNTER — Ambulatory Visit: Admission: RE | Admit: 2020-09-29 | Payer: Medicare Other | Source: Ambulatory Visit

## 2020-10-11 ENCOUNTER — Ambulatory Visit
Admission: RE | Admit: 2020-10-11 | Discharge: 2020-10-11 | Disposition: A | Discharge status: Home / Self Care | Payer: Medicare Other | Source: Ambulatory Visit | Attending: Internal Medicine | Admitting: Internal Medicine

## 2020-10-11 ENCOUNTER — Other Ambulatory Visit: Payer: Self-pay

## 2020-10-11 DIAGNOSIS — I700 Atherosclerosis of aorta: Secondary | ICD-10-CM | POA: Diagnosis present

## 2020-10-11 DIAGNOSIS — Z136 Encounter for screening for cardiovascular disorders: Secondary | ICD-10-CM | POA: Diagnosis present

## 2020-11-27 ENCOUNTER — Ambulatory Visit: Payer: Medicare Other

## 2020-11-27 ENCOUNTER — Other Ambulatory Visit: Payer: Self-pay

## 2020-11-27 ENCOUNTER — Ambulatory Visit: Payer: Medicare Other | Attending: Neurology | Admitting: Speech Pathology

## 2020-11-27 DIAGNOSIS — R279 Unspecified lack of coordination: Secondary | ICD-10-CM | POA: Insufficient documentation

## 2020-11-27 DIAGNOSIS — G20 Parkinson's disease: Secondary | ICD-10-CM | POA: Diagnosis present

## 2020-11-27 DIAGNOSIS — R2689 Other abnormalities of gait and mobility: Secondary | ICD-10-CM | POA: Insufficient documentation

## 2020-11-27 DIAGNOSIS — R2681 Unsteadiness on feet: Secondary | ICD-10-CM

## 2020-11-27 DIAGNOSIS — R471 Dysarthria and anarthria: Secondary | ICD-10-CM | POA: Insufficient documentation

## 2020-11-27 NOTE — Therapy (Signed)
[facility] MAIN [facility] SERVICES [address] [place], [place], [address] Phone: [phone]   Fax:  [phone]  Physical Therapy Evaluation  Patient Details  Name: Nicolas Solis MRN: [id number] Date of Birth: 03/01/1951 Referring Provider (PT): Cristopher Peru   Encounter Date: 11/27/2020   PT End of Session - 11/27/20 1325    Visit Number 1    Number of Visits 17    Date for PT Re-Evaluation 01/01/21    PT Start Time 1100    PT Stop Time 1159    PT Time Calculation (min) 59 min    Equipment Utilized During Treatment Gait belt    Activity Tolerance Patient tolerated treatment well    Behavior During Therapy WFL for tasks assessed/performed           Past Medical History:  Diagnosis Date  . Compression fracture of first cervical vertebra (HCC)   . Essential tremor   . Ileus [facility])     Past Surgical History:  Procedure Laterality Date  . 25 GAUGE PARS PLANA VITRECTOMY WITH 20 GAUGE MVR PORT Right 03/18/2017  . 25 GAUGE PARS PLANA VITRECTOMY WITH 20 GAUGE MVR PORT FOR MACULAR HOLE Right 03/18/2017   Procedure: 25 GAUGE PARS PLANA VITRECTOMY WITH 20 GAUGE MVR PORT FOR MACULAR HOLE; PHOTOCOAGULATION RIGHT EYE;  Surgeon: Sherrie George, MD;  Location: [facility] OR;  Service: Ophthalmology;  Laterality: Right;  . APPENDECTOMY    . COLONOSCOPY W/ POLYPECTOMY    . GAS INSERTION Right 03/18/2017   Procedure: INSERTION OF GAS RIGHT (C3F8);  Surgeon: Sherrie George, MD;  Location: [facility] OR;  Service: Ophthalmology;  Laterality: Right;  . GAS/FLUID EXCHANGE Right 03/18/2017   Procedure: GAS/FLUID EXCHANGE RIGHT EYE;  Surgeon: Sherrie George, MD;  Location: [facility] OR;  Service: Ophthalmology;  Laterality: Right;  . JOINT REPLACEMENT Right    Hip  . MEMBRANE PEEL Right 03/18/2017   Procedure: MEMBRANE PEEL RIGHT EYE;  Surgeon: Sherrie George, MD;  Location: [facility] OR;  Service: Ophthalmology;  Laterality: Right;  . MOUTH SURGERY    . PHOTOCOAGULATION Left  03/18/2017   Procedure: PHOTOCOAGULATION LEFT EYE;  Surgeon: Sherrie George, MD;  Location: [facility] OR;  Service: Ophthalmology;  Laterality: Left;  . SERUM PATCH Right 03/18/2017   Procedure: SERUM PATCH RIGHT EYE;  Surgeon: Sherrie George, MD;  Location: [facility] OR;  Service: Ophthalmology;  Laterality: Right;    There were no vitals filed for this visit.    Subjective Assessment - 11/27/20 1139    Subjective Patient is a pleasant 70 year old male who presents for LSVT eval.    Patient is accompained by: Family member    Pertinent History Patient is a 70 year old male who presents for LSVT BIG and LOUD evaluation. Patient recently diagnosed with Parkinson's disease (05/2020) and is Sinemet. He has increased urinary frequency and nocturia as well as low back pain with ambulation. Cogwheeling with his R more than L is noted per neurologist. Was doing [employer] in [place].  Has been using a cane since 2017 and now is on a walker. PMH includes L1 vertebral compression fx, AAA, anemia, essential tremor.    Limitations Lifting;Standing;Walking;House hold activities;Other (comment)    How long can you sit comfortably? R hip pain    How long can you stand comfortably? haven't tried: needs to hold on 2 minutes    How long can you walk comfortably? 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    Patient Stated Goals to be able to walk better    Currently in Pain? Yes    Pain Score 2     Pain Location Hip    Pain Orientation Right    Pain Descriptors / Indicators Aching    Pain Type Chronic pain    Pain Onset More than a month ago    Pain Frequency Intermittent    Aggravating Factors  walking,    Pain Relieving Factors resting              [facility] PT Assessment - 11/27/20 0001      Assessment   Medical Diagnosis Parkinsons    Referring Provider (PT) Sherryll Burger,  [place]    Onset Date/Surgical Date --   05/2020   Hand Dominance Right    Prior Therapy no      Precautions   Precautions Fall      Restrictions   Weight Bearing Restrictions No      Balance Screen   Has the patient fallen in the past 6 months Yes    How many times? 1    Has the patient had a decrease in activity level because of a fear of falling?  Yes    Is the patient reluctant to leave their home because of a fear of falling?  Yes      Home Environment   Living Environment Private residence    Living Arrangements Spouse/significant other    Available Help at Discharge Family    Type of Home House    Home Access Stairs to enter    Entrance Stairs-Number of Steps 5    Entrance Stairs-Rails Right;Left    Home Layout Two level    Home Equipment [place] - single point;Walker - 2 wheels;Walker - 4 wheels;Shower seat      Prior Function   Level of Independence Independent    Vocation Retired   Futures trader   Leisure watch tv movies, words with friends, puzzles      Observation/Other Assessments   Focus on Therapeutic Outcomes (FOTO)  40      Sensation   Light Touch Appears Intact      Coordination   Gross Motor Movements are Fluid and Coordinated No    Fine Motor Movements are Fluid and Coordinated No    Coordination and Movement Description cogwheel and dyskinetic    Finger Nose Finger Test past pointing dyskinetic      Posture/Postural Control   Posture/Postural Control Postural limitations    Postural Limitations Rounded Shoulders;Forward head;Flexed trunk      Ambulation/Gait   Gait Pattern Decreased arm swing - right;Decreased arm swing - left;Decreased step length - right;Decreased step length - left;Decreased stride length;Shuffle;Trunk flexed;Poor foot clearance - left;Poor foot clearance - right      Standardized Balance Assessment   Standardized Balance Assessment Berg Balance Test      Berg Balance Test   Sit to Stand Able to stand using hands after  several tries    Standing Unsupported Able to stand 2 minutes with supervision    Sitting with Back Unsupported but Feet Supported on Floor or Stool Able to sit safely and securely 2 minutes    Stand to Sit Controls descent by using hands    Transfers Able to transfer safely, definite need of hands    Standing Unsupported with Eyes  Closed Able to stand 3 seconds    Standing Unsupported with Feet Together Able to place feet together independently but unable to hold for 30 seconds    From Standing, Reach Forward with Outstretched Arm Can reach forward >5 cm safely (2")    From Standing Position, Pick up Object from Floor Able to pick up shoe, needs supervision    From Standing Position, Turn to Look Behind Over each Shoulder Needs supervision when turning    Turn 360 Degrees Able to turn 360 degrees safely but slowly    Standing Unsupported, Alternately Place Feet on Step/Stool Needs assistance to keep from falling or unable to try    Standing Unsupported, One Foot in Front Able to take small step independently and hold 30 seconds    Standing on One Leg Unable to try or needs assist to prevent fall    Total Score 29              Neurological and Other Medical Information: What were your initial symptoms of Parkinson disease? Tremors in bilateral hands  Do you have tremors?  Yes x  No____  If yes, please describe: bilateral hands with L more impaired than right  Do you have any other medical problem? Yes ___x__ No ____ If yes, please describe AAA, L 2 vertebral compression, anemia  Do you have any pain? Yes x  No _____ If yes, please describe: R hip and low back with movement  Pain rating (on scale of 1-10 with 10 being most severe): 4/10  Medical Information:  Medication for Parkinson disease: Sinemet In what ways are your medication(s) for Parkinson's helpful? Yes ; initially prescribed a lower dose: increased it and has helped.  Does your Parkinson medication affect your movement? Yes  x No____ If yes, please describe: decreases tremors, improves walking Do you experience on/off symptoms? Yes ____ No x  If yes, please describe: Do you experience any dyskinesias? Yes ___x__ No _____ If yes, please describe:  Motor Symptoms:  When did you first start to notice changes in your movement you associate with Parkinson disease? Had tremors for years prior to diagnosis  What are your current symptoms? Tremors, mobility: harder to get around; decreased step length, hard to get up from chairs.  What is your most significant problem related to movement today? Not able to walk normally.  What do you do when you want to move the best you possibly can? Focus really hard  Has Parkinson disease caused you to move less or be less active? Yes __x__ No____ If yes, how much less? Harder to move, takes longer to move.  Why has Parkinson disease caused you to move less? Body doesn't respond the same way:  Have you noticed if your movement is slower than it used to be? For example, walking, getting dressed, doing household chores, bathing, etc.  Yes __x_ No____ If yes please describe:everything is slower Have you or others noticed any changes in your posture? Yes _x___ No____ If yes, please describe: forward lean with walking  How many (if any) falls have you had in the last year? -The last 3 months? 1 fall coming off the deck -The last month? none -The last week? None  -What factors contributed to those falls?  If you have not had any falls in the last year, do you sometimes experience "near falls" where you are able to "catch" yourself and self-recover? Yes ____ No _x____ How often do you have these "near falls"? N/a  Have you noticed changes in your stamina? Yes _x__ No_____  If yes, please describe: fatigues quicker  Does your body feel fatigued at the end of the day? Yes _x___ No _____ If yes, please describe: Have you noticed any freezing with your movement? Yes __x__ No____ If yes, when do you  notice freezing? When first trying to get out of a chair Are there some activities you now need help with because of your Parkinson disease? For example, getting socks or shoes on, buttoning, getting up from low chairs, walking on uneven ground, etc.  Have you noticed any changes in the functioning of your hands? Yes __x__ No_____ ; If yes please describe: hard to button  Has Parkinson disease caused you to use your more affected hand less? Yes __x__ No____ If you have problems with your hand function today, what is/are the most significant problem (s)? Buttoning, putting on socks, etc  Have you noticed any changes in your ability to: -Button: yes -Dial the phone: a little -Open containers:yes -Manipulate money:no  -Tie shoes:can't do  -Write: depends on day  -Type or use a computer: sometimes  -Other:  Have you noticed if your hand movements are smaller than they used to be? Yes ____ No_x___ If yes, please describe:  Have you noticed if your hand movements are slower than they used to be? Yes __x___ No_____ If yes please describe: takes longer to do movements  Have you noticed if your hands feel any weaker than they used to? Yes __x___ No_____ If yes, please describe: Movement Situations:  If you had one situation in which you wanted to move well, what would it be? Hike with wife on vacation  Describe your day in terms of mobility or movement activity/situations (who is present, how do you move, when does it occur, with what devices). Wife has to help with movement  When do you find it most difficult to move? Hard to get moving in the morning  Why is it difficult to move in these situations/times that you mentioned? What would you like to improve about your ability to move? Move quicker and larger What aspect of your Parkinson disease bothers you most? My walking  Are there things you stopped doing because of Parkinson disease?hiking, walking without an AD  Why have you given up these  activities? Because of problems with Moving, Speaking, Motivation?  Assessment:  Sit to stand assessment:  5x STS: 15.35 seconds with heavy BUE support  Comments:  Right Left  Hip Extension 2+ 2+  Hip flexion 3+ 4-  Hip Abduction 3 4-  Hip Adduction 3 4-  Knee Extension  3+ 4-  Knee Flexion 3+ 4-  DF 4- 4-  PF 4- 4-   FOTO:40: predicted discharge score 53%   Gait assessments:  TUG; score: 14.21  Comments: with walker TUG cognitive: 17.2 seconds Comments: with walker 10 MWT: 13.2 seconds with walker  Comments:  Balance Assessments: Moderate-severe balance: BERG: 29/56  Comments: Functional Component movements: to be created with wife next session  1. Sit to stand 2.  3.  4.  5.  Hierarchies: 1. Get dressed without assistance  2. Increase capacity for functional activities with decreased need for rest breaks.  3. Be able to walk safely in public areas to go to [place] and hike on stable and unstable surfaces.         Objective measurements completed on examination: See above findings.       Patient is a pleasant  70 year old male who presents to PT with wife for LSVT treatment. He is able to ambulate with an [facility] with support from wife and/or close CGA. Testing performed with walker for patient safety at this time. His bilateral hand tremors limit his ADL performance and he has started noticing shuffle stepping and cogwheeling with mobility. Excessive trunk flexion noted during session due to pain in R hip, he is able to self correct with cues however quickly returns to flexed position.Patient is a good candidate for the standing modified LSVT program to address his ambulation goals and end plan of hiking with wife.  Patient will benefit from LSVT program to increase functional mobility, improve ADL performance, and improve quality of life.         PT Education - 11/27/20 1325    Education Details LSVT program, goals, POC    Person(s) Educated Patient;Spouse     Methods Explanation;Demonstration;Tactile cues;Verbal cues    Comprehension Verbalized understanding;Other (comment);Returned demonstration;Verbal cues required;Tactile cues required            PT Short Term Goals - 11/27/20 1331      PT SHORT TERM GOAL #1   Title Patient will be independent in home exercise program to improve strength/mobility for better functional independence with ADLs.    Time 2    Period Weeks    Status New    Target Date 12/11/20      PT SHORT TERM GOAL #2   Title Patient will be able to stand 5 x from standard height chair without UE support to increase functional mobility    Baseline 5/2: requires use of BUE support    Time 2    Period Weeks    Status New    Target Date 12/11/20             PT Long Term Goals - 11/27/20 1332      PT LONG TERM GOAL #1   Title Patient will increase FOTO score to equal to or greater than  53%   to demonstrate statistically significant improvement in mobility and quality of life.    Baseline 5/2: 40%    Time 5    Period Weeks    Status New    Target Date 01/01/21      PT LONG TERM GOAL #2   Title Patient (> 41 years old) will complete five times sit to stand test in < 15 seconds without UE support indicating an increased LE strength and improved balance.    Baseline 5/2: unable to perform w/o UE support 15.35 seconds with heavy BUE support    Time 5    Period Weeks    Status New    Target Date 01/01/21      PT LONG TERM GOAL #3   Title Patient will increase Berg Balance score by > 6 points (35/56)  to demonstrate decreased fall risk during functional activities.    Baseline 5/2: 29/56    Time 4    Period Weeks    Status New    Target Date 12/25/20      PT LONG TERM GOAL #4   Title Patient will reduce timed up and go to <11 seconds to reduce fall risk and demonstrate improved transfer/gait ability.    Baseline 5/2: 14.21    Time 5    Period Weeks    Status New    Target Date 01/01/21      PT LONG  TERM GOAL #5  Title Patient will be modified independent in walking on even/uneven surface with least restrictive assistive device, for 20+ minutes without rest break, reporting some difficulty or less to improve walking tolerance with community ambulation including grocery shopping, going to church,etc.    Baseline 5/2: at home can do 10 minutes with breaks    Time 5    Period Weeks    Status New    Target Date 01/01/21                  Plan - 11/27/20 1329    Clinical Impression Statement Patient is a pleasant 70 year old male who presents to PT with wife for LSVT treatment. He is able to ambulate with an [facility] with support from wife and/or close CGA. Testing performed with walker for patient safety at this time. His bilateral hand tremors limit his ADL performance and he has started noticing shuffle stepping and cogwheeling with mobility. Excessive trunk flexion noted during session due to pain in R hip, he is able to self correct with cues however quickly returns to flexed position.Patient is a good candidate for the standing modified LSVT program to address his ambulation goals and end plan of hiking with wife.  Patient will benefit from LSVT program to increase functional mobility, improve ADL performance, and improve quality of life.    Personal Factors and Comorbidities Age;Comorbidity 3+;Past/Current Experience;Fitness;Time since onset of injury/illness/exacerbation    Comorbidities L1 vertebral compression fx, AAA, anemia, essential tremor.    Examination-Activity Limitations Bathing;Bend;Caring for Others;Dressing;Hygiene/Grooming;Locomotion Level;Reach Overhead;Sit;Transfers;Stand;Stairs;Squat    Examination-Participation Restrictions Church;Cleaning;Community Activity;Driving;Interpersonal Relationship;Personal Finances;Meal Prep;Medication Management;Laundry;Shop;Volunteer;Yard Work    Conservation officer, historic buildings Evolving/Moderate complexity    Clinical Decision Making  Moderate    Rehab Potential Fair    PT Frequency 4x / week    PT Duration Other (comment)   5 weeks   PT Treatment/Interventions ADLs/Self Care Home Management;Cryotherapy;Electrical Stimulation;Iontophoresis 4mg /ml Dexamethasone;Moist Heat;Traction;DME Instruction;Gait training;Stair training;Functional mobility training;Therapeutic activities;Therapeutic exercise;Balance training;Neuromuscular re-education;Manual techniques;Patient/family education;Passive range of motion;Dry needling;Vestibular;Canalith Repostioning;Taping;Visual/perceptual remediation/compensation;Energy conservation    PT Next Visit Plan LSVT program    PT Home Exercise Plan next session    Consulted and Agree with Plan of Care Patient;Family member/caregiver    Family Member Consulted wife           Patient will benefit from skilled therapeutic intervention in order to improve the following deficits and impairments:  Abnormal gait,Cardiopulmonary status limiting activity,Decreased activity tolerance,Decreased balance,Decreased knowledge of precautions,Decreased endurance,Decreased coordination,Decreased knowledge of use of DME,Decreased mobility,Decreased safety awareness,Difficulty walking,Decreased strength,Impaired flexibility,Impaired perceived functional ability,Impaired tone,Impaired UE functional use,Postural dysfunction,Improper body mechanics,Pain  Visit Diagnosis: Other abnormalities of gait and mobility  Unsteadiness on feet  Unspecified lack of coordination     Problem List Patient Active Problem List   Diagnosis Date Noted  . Macular hole, right eye 03/18/2017   03/20/2017, PT, DPT   11/27/2020, 1:39 PM  [facility] MAIN [facility] SERVICES [address] [place], [place], [place] Phone: [phone]   Fax:  [phone]  Name: Nicolas Solis MRN: [id number] [id number] Date of Birth: 1951/07/14

## 2020-11-28 NOTE — Therapy (Signed)
[facility] MAIN [facility] SERVICES [address] [place], [place], [address] Phone: [phone]   Fax:  [phone]  Speech Language Pathology Evaluation  Patient Details  Name: Nicolas Solis MRN: [id number] Date of Birth: [birth year] Referring Provider (SLP): Dr. Sherryll Burger   Encounter Date: 11/27/2020   End of Session - 11/28/20 1441    Visit Number 1    Number of Visits 17    Date for SLP Re-Evaluation 02/25/21    Authorization Type MCR    Authorization - Visit Number 1    Progress Note Due on Visit 10    SLP Start Time 1000    SLP Stop Time  1100    SLP Time Calculation (min) 60 min    Activity Tolerance Patient tolerated treatment well           Past Medical History:  Diagnosis Date  . Compression fracture of first cervical vertebra (HCC)   . Essential tremor   . Ileus [facility])     Past Surgical History:  Procedure Laterality Date  . 25 GAUGE PARS PLANA VITRECTOMY WITH 20 GAUGE MVR PORT Right 03/18/2017  . 25 GAUGE PARS PLANA VITRECTOMY WITH 20 GAUGE MVR PORT FOR MACULAR HOLE Right 03/18/2017   Procedure: 25 GAUGE PARS PLANA VITRECTOMY WITH 20 GAUGE MVR PORT FOR MACULAR HOLE; PHOTOCOAGULATION RIGHT EYE;  Surgeon: Sherrie George, MD;  Location: [facility] OR;  Service: Ophthalmology;  Laterality: Right;  . APPENDECTOMY    . COLONOSCOPY W/ POLYPECTOMY    . GAS INSERTION Right 03/18/2017   Procedure: INSERTION OF GAS RIGHT (C3F8);  Surgeon: Sherrie George, MD;  Location: [facility] OR;  Service: Ophthalmology;  Laterality: Right;  . GAS/FLUID EXCHANGE Right 03/18/2017   Procedure: GAS/FLUID EXCHANGE RIGHT EYE;  Surgeon: Sherrie George, MD;  Location: [facility] OR;  Service: Ophthalmology;  Laterality: Right;  . JOINT REPLACEMENT Right    Hip  . MEMBRANE PEEL Right 03/18/2017   Procedure: MEMBRANE PEEL RIGHT EYE;  Surgeon: Sherrie George, MD;  Location: [facility] OR;  Service: Ophthalmology;  Laterality: Right;  . MOUTH SURGERY    . PHOTOCOAGULATION Left  03/18/2017   Procedure: PHOTOCOAGULATION LEFT EYE;  Surgeon: Sherrie George, MD;  Location: [facility] OR;  Service: Ophthalmology;  Laterality: Left;  . SERUM PATCH Right 03/18/2017   Procedure: SERUM PATCH RIGHT EYE;  Surgeon: Sherrie George, MD;  Location[facility] OR;  Service: Ophthalmology;  Laterality: Right;    There were no vitals filed for this visit.   Subjective Assessment - 11/27/20 1007    Subjective "Tremors for a couple years."    Patient is accompained by: Family member   Sheryl   Currently in Pain? No/denies              SLP Evaluation [facility] - 11/28/20 1436      SLP Visit Information   SLP Received On 11/27/20    Referring Provider (SLP) Dr. Sherryll Burger    Onset Date 10/03/2020   referral date; PD dx 05/2020   Medical Diagnosis Parkinson's disease      Subjective   Subjective alert, cooperative, pleasant mood      General Information   HPI Nicolas Solis is a 70 y.o. male referred by Dr. Sherryll Burger for LSVT-LOUD therapy due to decreased volume of speech secondary to Parkinson's disease, diagnosed November 2021.    Behavioral/Cognition alert, cooperative    Mobility Status uses a cane and needs assistance for longer distances  Balance Screen   Has the patient fallen in the past 6 months --   Has PT eval     Prior Functional Status   Cognitive/Linguistic Baseline Within functional limits      Cognition   Overall Cognitive Status Within Functional Limits for tasks assessed      Auditory Comprehension   Overall Auditory Comprehension Appears within functional limits for tasks assessed      Visual Recognition/Discrimination   Discrimination Not tested      Reading Comprehension   Reading Status Within funtional limits      Expression   Primary Mode of Expression Verbal      Verbal Expression   Overall Verbal Expression Appears within functional limits for tasks assessed      Written Expression   Dominant Hand Right    Written Expression Not tested      Oral  Motor/Sensory Function   Overall Oral Motor/Sensory Function Impaired    Labial ROM Reduced right;Reduced left    Labial Symmetry Within Functional Limits    Labial Strength Within Functional Limits    Facial ROM Reduced right;Reduced left    Facial Symmetry Within Functional Limits    Overall Oral Motor/Sensory Function mildly reduced movements      Motor Speech   Overall Motor Speech Impaired    Respiration Impaired    Level of Impairment Sentence    Phonation Hoarse;Low vocal intensity    Resonance Within functional limits    Articulation Impaired    Level of Impairment Conversation    Intelligibility Intelligibility reduced   SLP needed repeats x2   Conversation 75-100% accurate    Motor Planning Witnin functional limits    Phonation Impaired    Volume Soft    Pitch Appropriate      Standardized Assessments   Standardized Assessments  Other Assessment            LSVT-LOUD Voice Evaluation Maximum phonation time for sustained "ah": 15.4 seconds (poor quality)  Mean intensity during sustained "ah": 77 dB  Mean fundamental frequency during sustained "ah": 146 Hz (WNL for age and gender) Mean intensity sustained during conversational speech: 72 dB Habitual pitch: 157 Hz Highest dynamic pitch when altering pitch from a low note to a high note: 227 Hz Lowest dynamic pitch when altering from a high note to a low note: 125 Hz Highest dynamic pitch during conversational speech: 180 Hz Lowest dynamic pitch during conversational speech: 80 Hz   Speech is characterized by hypoarticulation, hypophonia and hoarse vocal quality. For trained listener in quiet environment with context, intelligibility was 95%.   Patient able to improve all parameters with model ("Loud like me") Stimulability: Improved vocal quality with loud voice (83 dB) for sustained vowel, maximum high and low phonation improved to range 128-264 Hz, and functional phrases averaged 86dB.  Improved pitch range with  model.     The Communication Effectiveness Survey is a patient-reported outcome measure in which the patient rates their own effectiveness in different communication situations. A higher score indicates greater effectiveness. Pt's self-rating was 22/32. Patient reported he had difficulty identifying voice changes; wife often reported lower effectiveness than pt's self-report.       SLP Education - 11/28/20 1441    Education Details rationale for LSVT-LOUD    Person(s) Educated Patient;Spouse    Methods Explanation    Comprehension Verbalized understanding            SLP Short Term Goals - 11/28/20 1443  SLP SHORT TERM GOAL #1   Title The patient will complete Daily Tasks (Maximum duration "ah", High/Lows, and Functional Phrases) at average loudness >/= 80 dB and with loud, good quality voice with min cues.    Time 10    Period --   sessions   Status New      SLP SHORT TERM GOAL #2   Title The patient will complete Hierarchal Speech Loudness reading drills (words/phrases, sentences) at average >/= 75 dB and with loud, good quality voice with min cues.    Time 10    Period --   sessions   Status New      SLP SHORT TERM GOAL #3   Title The patient will participate in 5-8 minutes conversation, maintaining average loudness of 75 dB and loud, good quality voice with min cues.    Time 10    Period --   sessions   Status New            SLP Long Term Goals - 11/28/20 1444      SLP LONG TERM GOAL #1   Title The patient will complete Daily Tasks (Maximum duration "ah", High/Lows, and Functional Phrases) at average loudness >/= 80 dB and with loud, good quality voice.    Time 4   or 17 sessions, for all LTGs   Period Weeks    Status New    Target Date 01/05/21      SLP LONG TERM GOAL #2   Title The patient will complete Hierarchal Speech Loudness reading drills (words/phrases, sentences, and paragraph) at average >/= 75 dB and with loud, good quality voice.    Time 4     Period Weeks    Status New    Target Date 01/05/21      SLP LONG TERM GOAL #3   Title The patient will participate in 15-20 minutes conversation, maintaining average loudness of 75 dB and loud, good quality voice.    Time 4    Period Weeks    Status New    Target Date 01/05/21      SLP LONG TERM GOAL #4   Title Patient will report improved communication effectiveness as measured by Communicative Effectiveness Survey.    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 Speech is characterized by reduced vocal intensity, hypoarticulation, and hoarse, gravelly vocal quality. Patient's wife reports pt needing to repeat himself often. Patient denies feeling any change in his voice, but was suprised to hear how he sounded on a recording, describing it as "weak" and that he sounded "sick." He was responsive to intensity-based cues which improved speech across parameters including intensity, vocal quality, pitch and articulation. He would benefit from skilled ST for LSVT-LOUD treatment in order to improve intelligibility and vocal quality for conversations with family and friends.    Speech Therapy Frequency 4x / week    Duration 4 weeks    Treatment/Interventions Aspiration precaution training;SLP instruction and feedback;Cueing hierarchy;Functional tasks;Patient/family education   LSVT-LOUD   Potential to Achieve Goals Good    SLP Home Exercise Plan to be provided next session    Consulted and Agree with Plan of Care Patient;Family member/caregiver  Patient will benefit from skilled therapeutic intervention in order to improve the following deficits and impairments:   Dysarthria and anarthria  Parkinson's disease [facility])    Problem List Patient  Active Problem List   Diagnosis Date Noted  . Macular hole, right eye 03/18/2017   Rondel Baton, MS, CCC-SLP Speech-Language Pathologist  Arlana Lindau 11/28/2020, 3:17 PM   [facility] MAIN [facility] SERVICES [address] [place], [place], [address] Phone: [phone]   Fax:  [phone]  Name: UNDRAY ALLMAN MRN: [id number] Date of Birth: 1950-08-11

## 2020-12-04 ENCOUNTER — Other Ambulatory Visit: Payer: Self-pay

## 2020-12-04 ENCOUNTER — Ambulatory Visit: Payer: Medicare Other

## 2020-12-04 ENCOUNTER — Ambulatory Visit: Payer: Medicare Other | Admitting: Speech Pathology

## 2020-12-04 DIAGNOSIS — R471 Dysarthria and anarthria: Secondary | ICD-10-CM | POA: Diagnosis not present

## 2020-12-04 DIAGNOSIS — R2689 Other abnormalities of gait and mobility: Secondary | ICD-10-CM

## 2020-12-04 DIAGNOSIS — R279 Unspecified lack of coordination: Secondary | ICD-10-CM

## 2020-12-04 DIAGNOSIS — R2681 Unsteadiness on feet: Secondary | ICD-10-CM

## 2020-12-04 NOTE — Therapy (Signed)
[name] MAIN [facility] SERVICES [address] [place], [place], [address] Phone: [phone]   Fax:  [phone]  Physical Therapy Treatment  Patient Details  Name: Nicolas GallusRichard C Solis MRN: [id number] Date of Birth: 07/04/1951 Referring Provider (PT): [name], Hemang   Encounter Date: 12/04/2020   PT End of Session - 12/04/20 1230    Visit Number 2    Number of Visits 17    Date for PT Re-Evaluation 01/01/21    PT Start Time 1100    PT Stop Time 1159    PT Time Calculation (min) 59 min    Equipment Utilized During Treatment Gait belt    Activity Tolerance Patient tolerated treatment well    Behavior During Therapy WFL for tasks assessed/performed           Past Medical History:  Diagnosis Date  . Compression fracture of first cervical vertebra (HCC)   . Essential tremor   . Ileus [facility](HCC)     Past Surgical History:  Procedure Laterality Date  . 25 GAUGE PARS PLANA VITRECTOMY WITH 20 GAUGE MVR PORT Right 03/18/2017  . 25 GAUGE PARS PLANA VITRECTOMY WITH 20 GAUGE MVR PORT FOR MACULAR HOLE Right 03/18/2017   Procedure: 25 GAUGE PARS PLANA VITRECTOMY WITH 20 GAUGE MVR PORT FOR MACULAR HOLE; PHOTOCOAGULATION RIGHT EYE;  Surgeon: [name], [name], MD;  [facility];  Service: Ophthalmology;  Laterality: Right;  . APPENDECTOMY    . COLONOSCOPY W/ POLYPECTOMY    . GAS INSERTION Right 03/18/2017   Procedure: INSERTION OF GAS RIGHT (C3F8);  Surgeon: [name], [name], MD;  [facility];  Service: Ophthalmology;  Laterality: Right;  . GAS/FLUID EXCHANGE Right 03/18/2017   Procedure: GAS/FLUID EXCHANGE RIGHT EYE;  Surgeon: [name], [name], MD;  [facility];  Service: Ophthalmology;  Laterality: Right;  . JOINT REPLACEMENT Right    Hip  . MEMBRANE PEEL Right 03/18/2017   Procedure: MEMBRANE PEEL RIGHT EYE;  Surgeon: [name], [name], MD;  [facility];  Service: Ophthalmology;  Laterality: Right;  . MOUTH SURGERY    . PHOTOCOAGULATION Left  03/18/2017   Procedure: PHOTOCOAGULATION LEFT EYE;  Surgeon: [name], [name], MD;  [facility];  Service: Ophthalmology;  Laterality: Left;  . SERUM PATCH Right 03/18/2017   Procedure: SERUM PATCH RIGHT EYE;  Surgeon: [name], [name], MD;  [facility];  Service: Ophthalmology;  Laterality: Right;    There were no vitals filed for this visit.   Subjective Assessment - 12/04/20 1229    Subjective Patient presents with his wife for first treatment session. Had a birthday since his last session.    Patient is accompained by: Family member    Pertinent History Patient is a 70 year old male who presents for LSVT BIG and LOUD evaluation. Patient recently diagnosed with Parkinson's disease (05/2020) and is Sinemet. He has increased urinary frequency and nocturia as well as low back pain with ambulation. Cogwheeling with his R more than L is noted per neurologist. [place] [employer] [place]  Has been using a cane since 2017 and now is on a walker. PMH includes L1 vertebral compression fx, AAA, anemia, essential tremor.    Limitations Lifting;Standing;Walking;House hold activities;Other (comment)    How long can you sit comfortably? R hip pain    How long can you stand comfortably? haven't tried: needs to hold on 2 minutes    How long can you walk comfortably? 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    Patient Stated Goals to be able to walk better    Currently in Pain? No/denies                Treatment:   Patient seen for LSVT Daily Session Adapted Daily Exercises for facilitation/coordination of movement Sustained movements are designed to rescale the amplitude of movement output for generalization to daily functional activities .Performed as follows for 1 set of 10 repetitions each multidirectional sustained movements  1) Floor to ceiling  , cues to hold for 10 seconds, needs modeling for correct positions , VC to reach out further and to reach up to the ceiling 2) Side to side multidirectional Repetitive movements performed in sitting and are designed to provide retraining effort needed for sustained muscle activation in tasks , cues to lean fwd and hold position x 10 counts: Patient does not move forward to side sitting to forward smoothly and is not able to transition or stretch out leg very far 3) Step and reach forward , cues for good knee flex, cues to move UE and LE together, cues to rotate  arms up to pronate forearms while holding onto chair 4) Step and reach sideways, cues to turn  head sideways, large step sideways and turn head back to neutral, and to rotate arms and pronate forearms while holding onto chair with opposite hand.  5) Step and reach backwards, cues for SUE back, getting toe up and bending back knee while holding onto chair and then swinging arm forward and stomping  6) Rock and reach forward/backward, cues to reach far fwd with UE, patient not comfortable with feet apart , not rocking very far to get a good weight shift, not able to raise  heel or raise  toes much with SUE on chair.  7) Rock and reach sideways, cues for twist and look behind , only rotates sideways, unable to twist around or turn to look behind with one hand on chair   functional component task with supervision 5 reps and simulated activities for: 1. Sit to stand x 5   needs cues for nose over toes and explosive movement.  2. signing name 3.put pant leg on/off 4.tall posture walking 5. Large steps walking:   Specific task training: sign name 5x on paper with standard size pen, pull leg onto knee 5x each side to increase flexibility bilaterally   Long ambulation for 160 ft with walking sticks, cueing for large steps and upright posture   Pt educated throughout session about proper posture and technique with exercises. Improved exercise  technique, movement at target joints, use of target muscles after min to mod verbal, visual, tactile cues.    Patient needs cueing for BIG swing and BIG amplitude.  Patient needs cueing for correct BIG arm swing. Patient improves with practice. Patient needs cuing to consistently swing his arms and also swing reciprocally. Patient needs cuing to swing UE's reciprocally with weight shift exercise and with backward stepping and correct UE positioning.  Wife and patient educated on LSVT treatment with both verbalizing and demonstrating understanding. Homework of five large handshakes given. Patient unsteady throughout multiple exercises and requires education on widening BOS.  Patient needs cueing for BIG swing and BIG amplitude.  Patient needs cueing for correct BIG arm swing. Patient improves with practice. Patient needs cuing to consistently swing his arms and also swing reciprocally.  Excessive forward trunk lean noted throughout session due to back pain and hip immobility. Patient will benefit from LSVT program to increase functional mobility, improve ADL performance, and improve quality of life.                    PT Education - 12/04/20 1230    Education Details exercise technique, body mechanics    Person(s) Educated Patient    Methods Explanation;Demonstration;Tactile cues;Verbal cues    Comprehension Verbalized understanding;Returned demonstration;Verbal cues required;Tactile cues required            PT Short Term Goals - 11/27/20 1331      PT SHORT TERM GOAL #1   Title Patient will be independent in home exercise program to improve strength/mobility for better functional independence with ADLs.    Time 2    Period Weeks    Status New    Target Date 12/11/20      PT SHORT TERM GOAL #2   Title Patient will be able to stand 5 x from standard height chair without UE support to increase functional mobility    Baseline 5/2: requires use of BUE support    Time 2     Period Weeks    Status New    Target Date 12/11/20             PT Long Term Goals - 11/27/20 1332      PT LONG TERM GOAL #1   Title Patient will increase FOTO score to equal to or greater than  53%   to demonstrate statistically significant improvement in mobility and quality of life.    Baseline 5/2: 40%    Time 5    Period Weeks    Status New    Target Date 01/01/21      PT LONG TERM GOAL #2   Title Patient (> 29 years old) will complete five times sit to stand test in < 15 seconds without UE support indicating an increased LE strength and improved balance.    Baseline 5/2: unable to perform w/o UE support 15.35 seconds with heavy BUE support    Time 5    Period Weeks    Status New    Target Date 01/01/21      PT LONG TERM GOAL #3   Title Patient will increase Berg Balance score by > 6 points (35/56)  to demonstrate decreased fall risk during functional activities.    Baseline 5/2: 29/56    Time 4    Period Weeks    Status New    Target Date 12/25/20      PT LONG TERM GOAL #4   Title Patient will reduce timed up and go to <11 seconds to reduce fall risk and demonstrate improved transfer/gait ability.    Baseline 5/2: 14.21    Time 5    Period Weeks    Status New    Target Date 01/01/21      PT LONG TERM GOAL #5   Title Patient will be modified independent in walking on even/uneven surface with least restrictive assistive device, for 20+ minutes without rest break, reporting some difficulty or less to improve walking tolerance with community ambulation including grocery shopping, going to church,etc.    Baseline 5/2: at home can do 10 minutes with breaks    Time 5    Period Weeks    Status New    Target Date 01/01/21  Plan - 12/04/20 1237    Clinical Impression Statement Wife and patient educated on LSVT treatment with both verbalizing and demonstrating understanding. Homework of five large handshakes given. Patient unsteady throughout  multiple exercises and requires education on widening BOS.  Patient needs cueing for BIG swing and BIG amplitude.  Patient needs cueing for correct BIG arm swing. Patient improves with practice. Patient needs cuing to consistently swing his arms and also swing reciprocally. Excessive forward trunk lean noted throughout session due to back pain and hip immobility. Patient will benefit from LSVT program to increase functional mobility, improve ADL performance, and improve quality of life.    Personal Factors and Comorbidities Age;Comorbidity 3+;Past/Current Experience;Fitness;Time since onset of injury/illness/exacerbation    Comorbidities L1 vertebral compression fx, AAA, anemia, essential tremor.    Examination-Activity Limitations Bathing;Bend;Caring for Others;Dressing;Hygiene/Grooming;Locomotion Level;Reach Overhead;Sit;Transfers;Stand;Stairs;Squat    Examination-Participation Restrictions Church;Cleaning;Community Activity;Driving;Interpersonal Relationship;Personal Finances;Meal Prep;Medication Management;Laundry;Shop;Volunteer;Yard Work    Conservation officer, historic buildings Evolving/Moderate complexity    Rehab Potential Fair    PT Frequency 4x / week    PT Duration Other (comment)   5 weeks   PT Treatment/Interventions ADLs/Self Care Home Management;Cryotherapy;Electrical Stimulation;Iontophoresis 4mg /ml Dexamethasone;Moist Heat;Traction;DME Instruction;Gait training;Stair training;Functional mobility training;Therapeutic activities;Therapeutic exercise;Balance training;Neuromuscular re-education;Manual techniques;Patient/family education;Passive range of motion;Dry needling;Vestibular;Canalith Repostioning;Taping;Visual/perceptual remediation/compensation;Energy conservation    PT Next Visit Plan LSVT program    PT Home Exercise Plan next session    Consulted and Agree with Plan of Care Patient;Family member/caregiver    Family Member Consulted wife           Patient will benefit from  skilled therapeutic intervention in order to improve the following deficits and impairments:  Abnormal gait,Cardiopulmonary status limiting activity,Decreased activity tolerance,Decreased balance,Decreased knowledge of precautions,Decreased endurance,Decreased coordination,Decreased knowledge of use of DME,Decreased mobility,Decreased safety awareness,Difficulty walking,Decreased strength,Impaired flexibility,Impaired perceived functional ability,Impaired tone,Impaired UE functional use,Postural dysfunction,Improper body mechanics,Pain  Visit Diagnosis: Other abnormalities of gait and mobility  Unsteadiness on feet  Unspecified lack of coordination     Problem List Patient Active Problem List   Diagnosis Date Noted  . Macular hole, right eye 03/18/2017    03/20/2017, PT, DPT   12/04/2020, 12:39 PM  [place] [facility] MAIN [facility] SERVICES [address] [place], [place], [place] Phone: [phone]   Fax:  [phone]  Name: ASHELY JOSHUA MRN: Nicolas Solis Date of Birth: April 03, 1951

## 2020-12-05 ENCOUNTER — Ambulatory Visit: Payer: Medicare Other | Admitting: Speech Pathology

## 2020-12-05 ENCOUNTER — Other Ambulatory Visit: Payer: Self-pay

## 2020-12-05 ENCOUNTER — Ambulatory Visit: Payer: Medicare Other

## 2020-12-05 DIAGNOSIS — R471 Dysarthria and anarthria: Secondary | ICD-10-CM | POA: Diagnosis not present

## 2020-12-05 DIAGNOSIS — R279 Unspecified lack of coordination: Secondary | ICD-10-CM

## 2020-12-05 DIAGNOSIS — R2689 Other abnormalities of gait and mobility: Secondary | ICD-10-CM

## 2020-12-05 DIAGNOSIS — R2681 Unsteadiness on feet: Secondary | ICD-10-CM

## 2020-12-05 NOTE — Therapy (Signed)
[place] [facility] MAIN [name], [place], [address] Phone: [phone]   Fax:  [phone]  Physical Therapy Treatment  Patient Details  Name: Nicolas GallusRichard C Latin MRN: [id number] Date of Birth: 01/09/1951 Referring Provider (PT): [name], Hemang   Encounter Date: 12/05/2020   PT End of Session - 12/05/20 1253    Visit Number 3    Number of Visits 17    Date for PT Re-Evaluation 01/01/21    PT Start Time 1100    PT Stop Time 1200    PT Time Calculation (min) 60 min    Equipment Utilized During Treatment Gait belt    Activity Tolerance Patient tolerated treatment well    Behavior During Therapy WFL for tasks assessed/performed           Past Medical History:  Diagnosis Date  . Compression fracture of first cervical vertebra (HCC)   . Essential tremor   . Ileus [facility](HCC)     Past Surgical History:  Procedure Laterality Date  . 25 GAUGE PARS PLANA VITRECTOMY WITH 20 GAUGE MVR PORT Right 03/18/2017  . 25 GAUGE PARS PLANA VITRECTOMY WITH 20 GAUGE MVR PORT FOR MACULAR HOLE Right 03/18/2017   Procedure: 25 GAUGE PARS PLANA VITRECTOMY WITH 20 GAUGE MVR PORT FOR MACULAR HOLE; PHOTOCOAGULATION RIGHT EYE;  Surgeon: [name], [name], MD;  [facility];  Service: Ophthalmology;  Laterality: Right;  . APPENDECTOMY    . COLONOSCOPY W/ POLYPECTOMY    . GAS INSERTION Right 03/18/2017   Procedure: INSERTION OF GAS RIGHT (C3F8);  Surgeon: [name], [name], MD;  [facility];  Service: Ophthalmology;  Laterality: Right;  . GAS/FLUID EXCHANGE Right 03/18/2017   Procedure: GAS/FLUID EXCHANGE RIGHT EYE;  Surgeon: [name], [name], MD;  [facility];  Service: Ophthalmology;  Laterality: Right;  . JOINT REPLACEMENT Right    Hip  . MEMBRANE PEEL Right 03/18/2017   Procedure: MEMBRANE PEEL RIGHT EYE;  Surgeon: [name], [name], MD;  [facility];  Service: Ophthalmology;  Laterality: Right;  . MOUTH SURGERY    . PHOTOCOAGULATION Left  03/18/2017   Procedure: PHOTOCOAGULATION LEFT EYE;  Surgeon: [name], [name], MD;  [facility];  Service: Ophthalmology;  Laterality: Left;  . SERUM PATCH Right 03/18/2017   Procedure: SERUM PATCH RIGHT EYE;  Surgeon: [name], [name], MD;  [facility];  Service: Ophthalmology;  Laterality: Right;    There were no vitals filed for this visit.   Subjective Assessment - 12/05/20 1252    Subjective Patient reports he was compliant with home program. Had rock and box yesterday. No falls or LOB since last session.    Patient is accompained by: Family member    Pertinent History Patient is a 70 year old male who presents for LSVT BIG and LOUD evaluation. Patient recently diagnosed with Parkinson's disease (05/2020) and is Sinemet. He has increased urinary frequency and nocturia as well as low back pain with ambulation. Cogwheeling with his R more than L is noted per neurologist. [place] [employer] [place]  Has been using a cane since 2017 and now is on a walker. PMH includes L1 vertebral compression fx, AAA, anemia, essential tremor.    Limitations Lifting;Standing;Walking;House hold activities;Other (comment)    How long can you sit comfortably? R hip pain    How long can you stand comfortably? haven't tried: needs to hold on 2 minutes    How long can you walk  comfortably? 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    Patient Stated Goals to be able to walk better    Currently in Pain? No/denies                 Treatment:   Patient seen for LSVT Daily Session Adapted Daily Exercises for facilitation/coordination of movement Sustained movements are designed to rescale the amplitude of movement output for generalization to daily functional activities .Performed as follows for 1 set of 10 repetitions each multidirectional sustained movements  1)  Floor to ceiling , cues to hold for 10 seconds, needs modeling for correct positions , VC to reach out further and to reach up to the ceiling 2) Side to side multidirectional Repetitive movements performed in sitting and are designed to provide retraining effort needed for sustained muscle activation in tasks , cues to lean fwd and hold position x 10 counts: Patient does not move forward to side sitting to forward smoothly and is not able to transition or stretch out leg very far 3) Step and reach forward , cues for good knee flex, cues to move UE and LE together, cues to rotate  arms up to pronate forearms while holding onto chair 4) Step and reach sideways, cues to turn  head sideways, large step sideways and turn head back to neutral, and to rotate arms and pronate forearms while holding onto chair with opposite hand.  5) Step and reach backwards, cues for SUE back, getting toe up and bending back knee while holding onto chair and then swinging arm forward and stomping  6) Rock and reach forward/backward, cues to reach far fwd with UE, patient not comfortable with feet apart , not rocking very far to get a good weight shift, not able to raise  heel or raise  toes much with SUE on chair.  7) Rock and reach sideways, cues for twist and look behind , only rotates sideways, unable to twist around or turn to look behind with one hand on chair   functional component task with supervision 5 reps and simulated activities for: 1. Sit to stand x 5   needs cues for nose over toes and explosive movement.  2. signing name 3.put pant leg on/off 4.tall posture walking 5. Large steps walking:    Specific task training: sign name 5x on paper with standard size pen, pull leg onto knee 5x each side to increase flexibility bilaterally , pull pants to knee 5x each leg bilaterally ; utilizing different color clips seated reach to put on basketball hoop x 19 clips, L hand to bring down;    Long ambulation for 460 ft  with walking sticks, cueing for large steps and upright posture; one seated rest break.     Pt educated throughout session about proper posture and technique with exercises. Improved exercise technique, movement at target joints, use of target muscles after min to mod verbal, visual, tactile cues.   Patient needs cueing for BIG swing and BIG amplitude.  Patient needs cueing for correct BIG arm swing. Patient improves with practice. Patient needs cuing to consistently swing his arms and also swing reciprocally. Patient needs cuing to swing UE's reciprocally with weight shift exercise and with backward stepping and correct UE positioning.   Patient tolerates progressive ambulation and mobility with need for seated rest break and verbal cueing for "BIG" movements. Fine motor  skills increased with repetition. Patient needs cueing for correct BIG arm swing. Patient improves with practice. Patient needs cuing to consistently swing his arms and also swing reciprocally. Excessive forward trunk lean noted throughout session due to back pain and hip immobility; especially with prolonged ambulation. Patient will benefit from LSVT program to increase functional mobility, improve ADL performance, and improve quality of life.                        PT Education - 12/05/20 1253    Education Details exercise technique, body mechanics    Person(s) Educated Patient    Methods Explanation;Demonstration;Tactile cues;Verbal cues    Comprehension Verbalized understanding;Returned demonstration;Verbal cues required;Tactile cues required            PT Short Term Goals - 11/27/20 1331      PT SHORT TERM GOAL #1   Title Patient will be independent in home exercise program to improve strength/mobility for better functional independence with ADLs.    Time 2    Period Weeks    Status New    Target Date 12/11/20      PT SHORT TERM GOAL #2   Title Patient will be able to stand 5 x from standard  height chair without UE support to increase functional mobility    Baseline 5/2: requires use of BUE support    Time 2    Period Weeks    Status New    Target Date 12/11/20             PT Long Term Goals - 11/27/20 1332      PT LONG TERM GOAL #1   Title Patient will increase FOTO score to equal to or greater than  53%   to demonstrate statistically significant improvement in mobility and quality of life.    Baseline 5/2: 40%    Time 5    Period Weeks    Status New    Target Date 01/01/21      PT LONG TERM GOAL #2   Title Patient (> 62 years old) will complete five times sit to stand test in < 15 seconds without UE support indicating an increased LE strength and improved balance.    Baseline 5/2: unable to perform w/o UE support 15.35 seconds with heavy BUE support    Time 5    Period Weeks    Status New    Target Date 01/01/21      PT LONG TERM GOAL #3   Title Patient will increase Berg Balance score by > 6 points (35/56)  to demonstrate decreased fall risk during functional activities.    Baseline 5/2: 29/56    Time 4    Period Weeks    Status New    Target Date 12/25/20      PT LONG TERM GOAL #4   Title Patient will reduce timed up and go to <11 seconds to reduce fall risk and demonstrate improved transfer/gait ability.    Baseline 5/2: 14.21    Time 5    Period Weeks    Status New    Target Date 01/01/21      PT LONG TERM GOAL #5   Title Patient will be modified independent in walking on even/uneven surface with least restrictive assistive device, for 20+ minutes without rest break, reporting some difficulty or less to improve walking tolerance with community ambulation including grocery shopping, going to church,etc.    Baseline 5/2: at home  can do 10 minutes with breaks    Time 5    Period Weeks    Status New    Target Date 01/01/21                 Plan - 12/05/20 1254    Clinical Impression Statement Patient tolerates progressive ambulation and  mobility with need for seated rest break and verbal cueing for "BIG" movements. Fine motor skills increased with repetition. Patient needs cueing for correct BIG arm swing. Patient improves with practice. Patient needs cuing to consistently swing his arms and also swing reciprocally. Excessive forward trunk lean noted throughout session due to back pain and hip immobility; especially with prolonged ambulation. Patient will benefit from LSVT program to increase functional mobility, improve ADL performance, and improve quality of life.    Personal Factors and Comorbidities Age;Comorbidity 3+;Past/Current Experience;Fitness;Time since onset of injury/illness/exacerbation    Comorbidities L1 vertebral compression fx, AAA, anemia, essential tremor.    Examination-Activity Limitations Bathing;Bend;Caring for Others;Dressing;Hygiene/Grooming;Locomotion Level;Reach Overhead;Sit;Transfers;Stand;Stairs;Squat    Examination-Participation Restrictions Church;Cleaning;Community Activity;Driving;Interpersonal Relationship;Personal Finances;Meal Prep;Medication Management;Laundry;Shop;Volunteer;Yard Work    Conservation officer, historic buildings Evolving/Moderate complexity    Rehab Potential Fair    PT Frequency 4x / week    PT Duration Other (comment)   5 weeks   PT Treatment/Interventions ADLs/Self Care Home Management;Cryotherapy;Electrical Stimulation;Iontophoresis 4mg /ml Dexamethasone;Moist Heat;Traction;DME Instruction;Gait training;Stair training;Functional mobility training;Therapeutic activities;Therapeutic exercise;Balance training;Neuromuscular re-education;Manual techniques;Patient/family education;Passive range of motion;Dry needling;Vestibular;Canalith Repostioning;Taping;Visual/perceptual remediation/compensation;Energy conservation    PT Next Visit Plan LSVT program    PT Home Exercise Plan next session    Consulted and Agree with Plan of Care Patient;Family member/caregiver    Family Member Consulted  wife           Patient will benefit from skilled therapeutic intervention in order to improve the following deficits and impairments:  Abnormal gait,Cardiopulmonary status limiting activity,Decreased activity tolerance,Decreased balance,Decreased knowledge of precautions,Decreased endurance,Decreased coordination,Decreased knowledge of use of DME,Decreased mobility,Decreased safety awareness,Difficulty walking,Decreased strength,Impaired flexibility,Impaired perceived functional ability,Impaired tone,Impaired UE functional use,Postural dysfunction,Improper body mechanics,Pain  Visit Diagnosis: Other abnormalities of gait and mobility  Unsteadiness on feet  Unspecified lack of coordination     Problem List Patient Active Problem List   Diagnosis Date Noted  . Macular hole, right eye 03/18/2017   03/20/2017, PT, DPT   12/05/2020, 12:55 PM  [facility] MAIN [facility] SERVICES [address] [place], [place], [place] Phone: [phone]   Fax:  [phone]  Name: TEVION LAFORGE MRN: Nicolas Solis Date of Birth: [birth year]

## 2020-12-06 ENCOUNTER — Ambulatory Visit: Payer: Medicare Other

## 2020-12-06 ENCOUNTER — Ambulatory Visit: Payer: Medicare Other | Admitting: Speech Pathology

## 2020-12-06 DIAGNOSIS — R2681 Unsteadiness on feet: Secondary | ICD-10-CM

## 2020-12-06 DIAGNOSIS — R471 Dysarthria and anarthria: Secondary | ICD-10-CM | POA: Diagnosis not present

## 2020-12-06 DIAGNOSIS — G20 Parkinson's disease: Secondary | ICD-10-CM

## 2020-12-06 DIAGNOSIS — R279 Unspecified lack of coordination: Secondary | ICD-10-CM

## 2020-12-06 DIAGNOSIS — R2689 Other abnormalities of gait and mobility: Secondary | ICD-10-CM

## 2020-12-06 NOTE — Patient Instructions (Signed)
LSVT LOUD Homework thru worksheet with functional words/phrases

## 2020-12-06 NOTE — Therapy (Addendum)
[place] [facility] MAIN [facility] SERVICES [address] [place], [place], [address] Phone: [phone]   Fax:  [phone]  Speech Language Pathology Treatment  Patient Details  Name: Nicolas Solis MRN: [id number] Date of Birth: 1950-10-20 Referring Provider (SLP): Dr. Sherryll Burger   Encounter Date: 12/06/2020   End of Session - 12/06/20 1513    Visit Number 2    Number of Visits 17    Date for SLP Re-Evaluation 02/25/21    Authorization Type MCR    Authorization - Visit Number 2   Progress Note Due on Visit 10    SLP Start Time 1000    SLP Stop Time  1100    SLP Time Calculation (min) 60 min    Activity Tolerance Patient tolerated treatment well           Past Medical History:  Diagnosis Date  . Compression fracture of first cervical vertebra (HCC)   . Essential tremor   . Ileus [facility])     Past Surgical History:  Procedure Laterality Date  . 25 GAUGE PARS PLANA VITRECTOMY WITH 20 GAUGE MVR PORT Right 03/18/2017  . 25 GAUGE PARS PLANA VITRECTOMY WITH 20 GAUGE MVR PORT FOR MACULAR HOLE Right 03/18/2017   Procedure: 25 GAUGE PARS PLANA VITRECTOMY WITH 20 GAUGE MVR PORT FOR MACULAR HOLE; PHOTOCOAGULATION RIGHT EYE;  Surgeon: Sherrie George, MD;  [facility] OR;  Service: Ophthalmology;  Laterality: Right;  . APPENDECTOMY    . COLONOSCOPY W/ POLYPECTOMY    . GAS INSERTION Right 03/18/2017   Procedure: INSERTION OF GAS RIGHT (C3F8);  Surgeon: Sherrie George, MD;  Location: [facility] OR;  Service: Ophthalmology;  Laterality: Right;  . GAS/FLUID EXCHANGE Right 03/18/2017   Procedure: GAS/FLUID EXCHANGE RIGHT EYE;  Surgeon: Sherrie George, MD;  [facility] OR;  Service: Ophthalmology;  Laterality: Right;  . JOINT REPLACEMENT Right    Hip  . MEMBRANE PEEL Right 03/18/2017   Procedure: MEMBRANE PEEL RIGHT EYE;  Surgeon: Sherrie George, MD;  Location: [facility] OR;  Service: Ophthalmology;  Laterality: Right;  . MOUTH SURGERY    . PHOTOCOAGULATION Left 03/18/2017    Procedure: PHOTOCOAGULATION LEFT EYE;  Surgeon: Sherrie George, MD;  [facility] OR;  Service: Ophthalmology;  Laterality: Left;  . SERUM PATCH Right 03/18/2017   Procedure: SERUM PATCH RIGHT EYE;  Surgeon: Sherrie George, MD;  Location: [facility] OR;  Service: Ophthalmology;  Laterality: Right;    There were no vitals filed for this visit.   Subjective Assessment - 12/06/20 1510    Subjective "why do I have to come everyday - 4 days a week for 4 weeks?"    Patient is accompained by: Family member    Currently in Pain? No/denies                 ADULT SLP TREATMENT - 12/06/20 0001      Cognitive-Linquistic Treatment   Skilled Treatment LSVT Daily tasks with modified independence (initial model). Loud /a/ averaged 84 dB, duration 7.9 seconds, pitch avg 170 Hz.  Max F0 for pitch glides was 301 Hz (84 dB avg), min F0 for low glides was 161 Hz (83 dB avg). Functional phrases averaged 87 dB with modified independence; words/phrases 236 Hz (88 dB avg.)  Cues required for carryover of good quality loud voicing.            SLP Education - 12/06/20 1513    Education Details rationale for LSVT-LOUD  Person(s) Educated Patient;Spouse    Methods Explanation;Demonstration;Verbal cues;Handout    Comprehension Verbalized understanding;Need further instruction            SLP Short Term Goals - 11/28/20 1443      SLP SHORT TERM GOAL #1   Title The patient will complete Daily Tasks (Maximum duration "ah", High/Lows, and Functional Phrases) at average loudness >/= 80 dB and with loud, good quality voice with min cues.    Time 10    Period --   sessions   Status New      SLP SHORT TERM GOAL #2   Title The patient will complete Hierarchal Speech Loudness reading drills (words/phrases, sentences) at average >/= 75 dB and with loud, good quality voice with min cues.    Time 10    Period --   sessions   Status New      SLP SHORT TERM GOAL #3   Title The patient will participate in 5-8  minutes conversation, maintaining average loudness of 75 dB and loud, good quality voice with min cues.    Time 10    Period --   sessions   Status New            SLP Long Term Goals - 11/28/20 1444      SLP LONG TERM GOAL #1   Title The patient will complete Daily Tasks (Maximum duration "ah", High/Lows, and Functional Phrases) at average loudness >/= 80 dB and with loud, good quality voice.    Time 4   or 17 sessions, for all LTGs   Period Weeks    Status New    Target Date 01/05/21      SLP LONG TERM GOAL #2   Title The patient will complete Hierarchal Speech Loudness reading drills (words/phrases, sentences, and paragraph) at average >/= 75 dB and with loud, good quality voice.    Time 4    Period Weeks    Status New    Target Date 01/05/21      SLP LONG TERM GOAL #3   Title The patient will participate in 15-20 minutes conversation, maintaining average loudness of 75 dB and loud, good quality voice.    Time 4    Period Weeks    Status New    Target Date 01/05/21      SLP LONG TERM GOAL #4   Title Patient will report improved communication effectiveness as measured by Communicative Effectiveness Survey.    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 Pt required minimal assistance to condition to LSVT activities. He reports exerting a 7 out of 10 effort level to achieve targets within hierarchical tasks. In addition, he was able to use loud voice when immediately speaking to his PT outside of ST session. Pt's wife reports that pt seldom talks at home and he shows little interest in any of the activities that she participates in. Carryover Assignment in Homework  targeted social interactions. Overall, pt lacks insight into current diagnosis and hypokinetic dysarthria. Continue skilled ST to maximize carryover of loudness and vocal quality, improve functional communication and intelligibility for conversations with family, friends  and in the community.   Speech Therapy Frequency 4x / week    Duration 4 weeks    Treatment/Interventions Cueing hierarchy;SLP instruction and feedback;Functional tasks;Patient/family education    Potential to Achieve Goals Good    SLP Home Exercise Plan provided, see instruction section    Consulted and Agree with Plan of Care Patient;Family member/caregiver    Family Member Consulted pt's wife           Patient will benefit from skilled therapeutic intervention in order to improve the following deficits and impairments:   Dysarthria and anarthria  Parkinson's disease [facility])    Problem List Patient Active Problem List   Diagnosis Date Noted  . Macular hole, right eye 03/18/2017   Rodgerick Gilliand B. Dreama Saa M.S., CCC-SLP, [facility] Speech-Language Pathologist Rehabilitation Services Office [phone]  Reuel Derby 12/06/2020, 3:15 PM  [place] [facility] MAIN [facility] SERVICES [address] [place], [place], [address] Phone: [phone]   Fax:  [phone]   Name: Nicolas Solis MRN: [id number] Date of Birth: April 19, 1951

## 2020-12-06 NOTE — Therapy (Signed)
[place] [facility] MAIN [facility] SERVICES [address] [place], [place], [address] Phone: [phone]   Fax:  [phone]  Physical Therapy Treatment  Patient Details  Name: Nicolas Solis MRN: [id number] Date of Birth: 02/14/1951 Referring Provider (PT): Cristopher Peru   Encounter Date: 12/06/2020   PT End of Session - 12/06/20 1302    Visit Number 4    Number of Visits 17    Date for PT Re-Evaluation 01/01/21    PT Start Time 1100    PT Stop Time 1155    PT Time Calculation (min) 55 min    Equipment Utilized During Treatment Gait belt    Activity Tolerance Patient tolerated treatment well    Behavior During Therapy WFL for tasks assessed/performed           Past Medical History:  Diagnosis Date  . Compression fracture of first cervical vertebra (HCC)   . Essential tremor   . Ileus [facility])     Past Surgical History:  Procedure Laterality Date  . 25 GAUGE PARS PLANA VITRECTOMY WITH 20 GAUGE MVR PORT Right 03/18/2017  . 25 GAUGE PARS PLANA VITRECTOMY WITH 20 GAUGE MVR PORT FOR MACULAR HOLE Right 03/18/2017   Procedure: 25 GAUGE PARS PLANA VITRECTOMY WITH 20 GAUGE MVR PORT FOR MACULAR HOLE; PHOTOCOAGULATION RIGHT EYE;  Surgeon: Sherrie George, MD;  Location: [facility] OR;  Service: Ophthalmology;  Laterality: Right;  . APPENDECTOMY    . COLONOSCOPY W/ POLYPECTOMY    . GAS INSERTION Right 03/18/2017   Procedure: INSERTION OF GAS RIGHT (C3F8);  Surgeon: Sherrie George, MD;  Location: [facility] OR;  Service: Ophthalmology;  Laterality: Right;  . GAS/FLUID EXCHANGE Right 03/18/2017   Procedure: GAS/FLUID EXCHANGE RIGHT EYE;  Surgeon: Sherrie George, MD;  Location: [facility] OR;  Service: Ophthalmology;  Laterality: Right;  . JOINT REPLACEMENT Right    Hip  . MEMBRANE PEEL Right 03/18/2017   Procedure: MEMBRANE PEEL RIGHT EYE;  Surgeon: Sherrie George, MD;  Location: [facility] OR;  Service: Ophthalmology;  Laterality: Right;  . MOUTH SURGERY    . PHOTOCOAGULATION Left  03/18/2017   Procedure: PHOTOCOAGULATION LEFT EYE;  Surgeon: Sherrie George, MD;  Location: [facility] OR;  Service: Ophthalmology;  Laterality: Left;  . SERUM PATCH Right 03/18/2017   Procedure: SERUM PATCH RIGHT EYE;  Surgeon: Sherrie George, MD;  Location: [facility] OR;  Service: Ophthalmology;  Laterality: Right;    There were no vitals filed for this visit.   Subjective Assessment - 12/06/20 1301    Subjective Patient reports compliance with HEP/daily program. Has his rock boxing tonight. No falls or LOB since last session.    Patient is accompained by: Family member    Pertinent History Patient is a 70 year old male who presents for LSVT BIG and LOUD evaluation. Patient recently diagnosed with Parkinson's disease (05/2020) and is Sinemet. He has increased urinary frequency and nocturia as well as low back pain with ambulation. Cogwheeling with his R more than L is noted per neurologist. Was doing [employer] [employer] in [place].  Has been using a cane since 2017 and now is on a walker. PMH includes L1 vertebral compression fx, AAA, anemia, essential tremor.    Limitations Lifting;Standing;Walking;House hold activities;Other (comment)    How long can you sit comfortably? R hip pain    How long can you stand comfortably? haven't tried: needs to hold on 2 minutes    How long can you walk comfortably? 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    Patient Stated Goals to be able to walk better    Currently in Pain? No/denies                  Treatment:   Patient seen for LSVT Daily Session Adapted Daily Exercises for facilitation/coordination of movement Sustained movements are designed to rescale the amplitude of movement output for generalization to daily functional activities .Performed as follows for 1 set of 10 repetitions each multidirectional sustained movements   1) Floor to ceiling , cues to hold for 10 seconds, needs modeling for correct positions , VC to reach out further and to reach up to the ceiling 2) Side to side multidirectional Repetitive movements performed in sitting and are designed to provide retraining effort needed for sustained muscle activation in tasks , cues to lean fwd and hold position x 10 counts: Patient does not move forward to side sitting to forward smoothly and is not able to transition or stretch out leg very far 3) Step and reach forward , cues for good knee flex, cues to move UE and LE together, cues to rotate  arms up to pronate forearms while holding onto chair 4) Step and reach sideways, cues to turn  head sideways, large step sideways and turn head back to neutral, and to rotate arms and pronate forearms while holding onto chair with opposite hand.  5) Step and reach backwards, cues for SUE back, getting toe up and bending back knee while holding onto chair and then swinging arm forward and stomping  6) Rock and reach forward/backward, cues to reach far fwd with UE, patient not comfortable with feet apart , not rocking very far to get a good weight shift, not able to raise  heel or raise  toes much with SUE on chair.  7) Rock and reach sideways, cues for twist and look behind , only rotates sideways, unable to twist around or turn to look behind with one hand on chair   functional component task with supervision 5 reps and simulated activities for: 1. Sit to stand x 5   needs cues for nose over toes and explosive movement.  2. signing name 3.put pant leg on/off 4.tall posture walking 5. Large steps walking:    Specific task training:  sign name 5x on paper with standard size pen,  pull leg onto knee 5x each side to increase flexibility bilaterally , 9 hole peg test: R 41.55 seconds L 47.26 seconds Tie 5 knots in rope to simulate tying shoe Step up/down 6" step with karate knee up 10x each LE BUE support Stand up from  sitting with half full cup of water, ambulate 10 ft, turn around obstacle and return to chair. 1x each hand, no spilling.   Long ambulation for 460 ft with walking sticks, cueing for large steps and upright posture; one seated rest break.     Pt educated throughout session about proper posture and technique with exercises. Improved exercise technique, movement at target joints, use of target muscles after min to mod verbal, visual, tactile cues.    Patient needs cueing for BIG swing and BIG amplitude.  Patient needs cueing for correct BIG arm swing. Patient improves with practice. Patient needs cuing to consistently swing his arms and also swing reciprocally. Patient needs cuing to swing UE's reciprocally with weight shift exercise  and with backward stepping and correct UE positioning.   Patient is becoming quicker with daily exercises with decreased need for cueing allowing for more time to be allocated to daily tasks. Patient has significant tremors in bilateral UE's limiting fine motor skills and 9 hole peg test was performed and timed. Patient needs maximal cueing for big steps especially as he fatigues.  Patient will benefit from LSVT program to increase functional mobility, improve ADL performance, and improve quality of life.                       PT Education - 12/06/20 1302    Education Details exercise technique, body mechanics    Person(s) Educated Patient    Methods Explanation;Demonstration;Tactile cues;Verbal cues    Comprehension Verbalized understanding;Returned demonstration;Verbal cues required;Tactile cues required            PT Short Term Goals - 11/27/20 1331      PT SHORT TERM GOAL #1   Title Patient will be independent in home exercise program to improve strength/mobility for better functional independence with ADLs.    Time 2    Period Weeks    Status New    Target Date 12/11/20      PT SHORT TERM GOAL #2   Title Patient will be able to stand  5 x from standard height chair without UE support to increase functional mobility    Baseline 5/2: requires use of BUE support    Time 2    Period Weeks    Status New    Target Date 12/11/20             PT Long Term Goals - 11/27/20 1332      PT LONG TERM GOAL #1   Title Patient will increase FOTO score to equal to or greater than  53%   to demonstrate statistically significant improvement in mobility and quality of life.    Baseline 5/2: 40%    Time 5    Period Weeks    Status New    Target Date 01/01/21      PT LONG TERM GOAL #2   Title Patient (> 71 years old) will complete five times sit to stand test in < 15 seconds without UE support indicating an increased LE strength and improved balance.    Baseline 5/2: unable to perform w/o UE support 15.35 seconds with heavy BUE support    Time 5    Period Weeks    Status New    Target Date 01/01/21      PT LONG TERM GOAL #3   Title Patient will increase Berg Balance score by > 6 points (35/56)  to demonstrate decreased fall risk during functional activities.    Baseline 5/2: 29/56    Time 4    Period Weeks    Status New    Target Date 12/25/20      PT LONG TERM GOAL #4   Title Patient will reduce timed up and go to <11 seconds to reduce fall risk and demonstrate improved transfer/gait ability.    Baseline 5/2: 14.21    Time 5    Period Weeks    Status New    Target Date 01/01/21      PT LONG TERM GOAL #5   Title Patient will be modified independent in walking on even/uneven surface with least restrictive assistive device, for 20+ minutes without rest break, reporting some difficulty or less to improve walking  tolerance with community ambulation including grocery shopping, going to church,etc.    Baseline 5/2: at home can do 10 minutes with breaks    Time 5    Period Weeks    Status New    Target Date 01/01/21                 Plan - 12/06/20 1303    Clinical Impression Statement Patient is becoming quicker  with daily exercises with decreased need for cueing allowing for more time to be allocated to daily tasks. Patient has significant tremors in bilateral UE's limiting fine motor skills and 9 hole peg test was performed and timed. Patient needs maximal cueing for big steps especially as he fatigues.  Patient will benefit from LSVT program to increase functional mobility, improve ADL performance, and improve quality of life.    Personal Factors and Comorbidities Age;Comorbidity 3+;Past/Current Experience;Fitness;Time since onset of injury/illness/exacerbation    Comorbidities L1 vertebral compression fx, AAA, anemia, essential tremor.    Examination-Activity Limitations Bathing;Bend;Caring for Others;Dressing;Hygiene/Grooming;Locomotion Level;Reach Overhead;Sit;Transfers;Stand;Stairs;Squat    Examination-Participation Restrictions Church;Cleaning;Community Activity;Driving;Interpersonal Relationship;Personal Finances;Meal Prep;Medication Management;Laundry;Shop;Volunteer;Yard Work    Conservation officer, historic buildings Evolving/Moderate complexity    Rehab Potential Fair    PT Frequency 4x / week    PT Duration Other (comment)   5 weeks   PT Treatment/Interventions ADLs/Self Care Home Management;Cryotherapy;Electrical Stimulation;Iontophoresis 4mg /ml Dexamethasone;Moist Heat;Traction;DME Instruction;Gait training;Stair training;Functional mobility training;Therapeutic activities;Therapeutic exercise;Balance training;Neuromuscular re-education;Manual techniques;Patient/family education;Passive range of motion;Dry needling;Vestibular;Canalith Repostioning;Taping;Visual/perceptual remediation/compensation;Energy conservation    PT Next Visit Plan LSVT program    PT Home Exercise Plan next session    Consulted and Agree with Plan of Care Patient;Family member/caregiver    Family Member Consulted wife           Patient will benefit from skilled therapeutic intervention in order to improve the following  deficits and impairments:  Abnormal gait,Cardiopulmonary status limiting activity,Decreased activity tolerance,Decreased balance,Decreased knowledge of precautions,Decreased endurance,Decreased coordination,Decreased knowledge of use of DME,Decreased mobility,Decreased safety awareness,Difficulty walking,Decreased strength,Impaired flexibility,Impaired perceived functional ability,Impaired tone,Impaired UE functional use,Postural dysfunction,Improper body mechanics,Pain  Visit Diagnosis: Other abnormalities of gait and mobility  Unsteadiness on feet  Unspecified lack of coordination     Problem List Patient Active Problem List   Diagnosis Date Noted  . Macular hole, right eye 03/18/2017   03/20/2017, PT, DPT   12/06/2020, 1:04 PM  [place] [facility] MAIN [facility] SERVICES [address] [place], [place], [place] Phone: [phone]   Fax:  [phone]  Name: ATTILIO ZEITLER MRN: Audrie Gallus Date of Birth: 11-26-1950

## 2020-12-07 ENCOUNTER — Other Ambulatory Visit: Payer: Self-pay

## 2020-12-07 ENCOUNTER — Ambulatory Visit: Payer: Medicare Other

## 2020-12-07 ENCOUNTER — Ambulatory Visit: Payer: Medicare Other | Admitting: Speech Pathology

## 2020-12-07 DIAGNOSIS — R2681 Unsteadiness on feet: Secondary | ICD-10-CM

## 2020-12-07 DIAGNOSIS — R2689 Other abnormalities of gait and mobility: Secondary | ICD-10-CM

## 2020-12-07 DIAGNOSIS — R279 Unspecified lack of coordination: Secondary | ICD-10-CM

## 2020-12-07 DIAGNOSIS — R471 Dysarthria and anarthria: Secondary | ICD-10-CM

## 2020-12-07 DIAGNOSIS — G20 Parkinson's disease: Secondary | ICD-10-CM

## 2020-12-07 NOTE — Therapy (Signed)
[facility] MAIN [facility] SERVICES [address] [place], [place], [address] Phone: [phone]   Fax:  [phone]  Physical Therapy Treatment  Patient Details  Name: Nicolas Solis MRN: [id number] Date of Birth: [birth year] Referring Provider (PT): Cristopher Peru   Encounter Date: 12/07/2020   PT End of Session - 12/07/20 1151    Visit Number 5    Number of Visits 17    Date for PT Re-Evaluation 01/01/21    PT Start Time 1100    PT Stop Time 1157    PT Time Calculation (min) 57 min    Equipment Utilized During Treatment Gait belt    Activity Tolerance Patient tolerated treatment well    Behavior During Therapy WFL for tasks assessed/performed           Past Medical History:  Diagnosis Date  . Compression fracture of first cervical vertebra (HCC)   . Essential tremor   . Ileus [facility])     Past Surgical History:  Procedure Laterality Date  . 25 GAUGE PARS PLANA VITRECTOMY WITH 20 GAUGE MVR PORT Right 03/18/2017  . 25 GAUGE PARS PLANA VITRECTOMY WITH 20 GAUGE MVR PORT FOR MACULAR HOLE Right 03/18/2017   Procedure: 25 GAUGE PARS PLANA VITRECTOMY WITH 20 GAUGE MVR PORT FOR MACULAR HOLE; PHOTOCOAGULATION RIGHT EYE;  Surgeon: Sherrie George, MD;  Location: [facility] OR;  Service: Ophthalmology;  Laterality: Right;  . APPENDECTOMY    . COLONOSCOPY W/ POLYPECTOMY    . GAS INSERTION Right 03/18/2017   Procedure: INSERTION OF GAS RIGHT (C3F8);  Surgeon: Sherrie George, MD;  Location: [facility] OR;  Service: Ophthalmology;  Laterality: Right;  . GAS/FLUID EXCHANGE Right 03/18/2017   Procedure: GAS/FLUID EXCHANGE RIGHT EYE;  Surgeon: Sherrie George, MD;  Location: [facility] OR;  Service: Ophthalmology;  Laterality: Right;  . JOINT REPLACEMENT Right    Hip  . MEMBRANE PEEL Right 03/18/2017   Procedure: MEMBRANE PEEL RIGHT EYE;  Surgeon: Sherrie George, MD;  Location: [facility] OR;  Service: Ophthalmology;  Laterality: Right;  . MOUTH SURGERY    . PHOTOCOAGULATION Left  03/18/2017   Procedure: PHOTOCOAGULATION LEFT EYE;  Surgeon: Sherrie George, MD;  Location: [facility] OR;  Service: Ophthalmology;  Laterality: Left;  . SERUM PATCH Right 03/18/2017   Procedure: SERUM PATCH RIGHT EYE;  Surgeon: Sherrie George, MD;  Location: [facility] OR;  Service: Ophthalmology;  Laterality: Right;    There were no vitals filed for this visit.   Subjective Assessment - 12/07/20 1150    Subjective Patient did not doe his daily tasks yesterday because he was too tired after rock boxing.    Patient is accompained by: Family member    Pertinent History Patient is a 70 year old male who presents for LSVT BIG and LOUD evaluation. Patient recently diagnosed with Parkinson's disease (05/2020) and is Sinemet. He has increased urinary frequency and nocturia as well as low back pain with ambulation. Cogwheeling with his R more than L is noted per neurologist. Was doing [employer] in [place].  Has been using a cane since 2017 and now is on a walker. PMH includes L1 vertebral compression fx, AAA, anemia, essential tremor.    Limitations Lifting;Standing;Walking;House hold activities;Other (comment)    How long can you sit comfortably? R hip pain    How long can you stand comfortably? haven't tried: needs to hold on 2 minutes    How long can you walk comfortably? 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    Patient Stated Goals to be able to walk better    Currently in Pain? No/denies                   Treatment:   Patient seen for LSVT Daily Session Adapted Daily Exercises for facilitation/coordination of movement Sustained movements are designed to rescale the amplitude of movement output for generalization to daily functional activities .Performed as follows for 1 set of 10 repetitions each multidirectional sustained movements  1) Floor to ceiling ,  cues to hold for 10 seconds, needs modeling for correct positions , VC to reach out further and to reach up to the ceiling 2) Side to side multidirectional Repetitive movements performed in sitting and are designed to provide retraining effort needed for sustained muscle activation in tasks , cues to lean fwd and hold position x 10 counts: Patient does not move forward to side sitting to forward smoothly and is not able to transition or stretch out leg very far 3) Step and reach forward , cues for good knee flex, cues to move UE and LE together, cues to rotate  arms up to pronate forearms while holding onto chair 4) Step and reach sideways, cues to turn  head sideways, large step sideways and turn head back to neutral, and to rotate arms and pronate forearms while holding onto chair with opposite hand.  5) Step and reach backwards, cues for SUE back, getting toe up and bending back knee while holding onto chair and then swinging arm forward and stomping  6) Rock and reach forward/backward, cues to reach far fwd with UE, patient not comfortable with feet apart , not rocking very far to get a good weight shift, not able to raise  heel or raise  toes much with SUE on chair.  7) Rock and reach sideways, cues for twist and look behind , only rotates sideways, unable to twist around or turn to look behind with one hand on chair   functional component task with supervision 5 reps and simulated activities for: 1. Sit to stand x 5   needs cues for nose over toes and explosive movement.  2. signing name 3.put pant leg on/off 4.tall posture walking 5. Large steps walking:    Specific task training: sign name 5x on paper with standard size pen pull leg onto knee 5x each side to increase flexibility bilaterally Grooved peg board L and RUE for coordination and fine motor skills Sit to stand holding onto mug with half full of water, walk without AD to chair 5 ft away, sit down stand back up and return to first  chair.    Long ambulation for 460 ft with walking sticks, cueing for large steps and upright posture; one seated rest break.     Pt educated throughout session about proper posture and technique with exercises. Improved exercise technique, movement at target joints, use of target muscles after min to mod verbal, visual, tactile cues.    Patient needs cueing for BIG swing and BIG amplitude.  Patient needs cueing for correct BIG arm swing. Patient improves with practice. Patient needs cuing to consistently swing his arms and also swing reciprocally. Patient needs cuing to swing UE's reciprocally with weight shift exercise and with backward stepping and correct UE positioning.   Patient educated on importance of compliance with daily exercises in order to  maximize improvement and prognosis. Fine motor skills as well as generalized strengthening will be beneficial for patient. He fatigues quickly with prolonged ambulation and low back pain is main limiting factor requiring a seated rest break during long ambulation task. Max cueing required for big steps and upright posture.  Patient will benefit from LSVT program to increase functional mobility, improve ADL performance, and improve quality of life.                     PT Education - 12/07/20 1150    Education Details importance of daily exercises    Person(s) Educated Patient    Methods Explanation;Tactile cues;Demonstration;Verbal cues    Comprehension Verbalized understanding;Returned demonstration;Verbal cues required;Tactile cues required            PT Short Term Goals - 11/27/20 1331      PT SHORT TERM GOAL #1   Title Patient will be independent in home exercise program to improve strength/mobility for better functional independence with ADLs.    Time 2    Period Weeks    Status New    Target Date 12/11/20      PT SHORT TERM GOAL #2   Title Patient will be able to stand 5 x from standard height chair without UE  support to increase functional mobility    Baseline 5/2: requires use of BUE support    Time 2    Period Weeks    Status New    Target Date 12/11/20             PT Long Term Goals - 11/27/20 1332      PT LONG TERM GOAL #1   Title Patient will increase FOTO score to equal to or greater than  53%   to demonstrate statistically significant improvement in mobility and quality of life.    Baseline 5/2: 40%    Time 5    Period Weeks    Status New    Target Date 01/01/21      PT LONG TERM GOAL #2   Title Patient (> 14 years old) will complete five times sit to stand test in < 15 seconds without UE support indicating an increased LE strength and improved balance.    Baseline 5/2: unable to perform w/o UE support 15.35 seconds with heavy BUE support    Time 5    Period Weeks    Status New    Target Date 01/01/21      PT LONG TERM GOAL #3   Title Patient will increase Berg Balance score by > 6 points (35/56)  to demonstrate decreased fall risk during functional activities.    Baseline 5/2: 29/56    Time 4    Period Weeks    Status New    Target Date 12/25/20      PT LONG TERM GOAL #4   Title Patient will reduce timed up and go to <11 seconds to reduce fall risk and demonstrate improved transfer/gait ability.    Baseline 5/2: 14.21    Time 5    Period Weeks    Status New    Target Date 01/01/21      PT LONG TERM GOAL #5   Title Patient will be modified independent in walking on even/uneven surface with least restrictive assistive device, for 20+ minutes without rest break, reporting some difficulty or less to improve walking tolerance with community ambulation including grocery shopping, going to church,etc.    Baseline 5/2: at  home can do 10 minutes with breaks    Time 5    Period Weeks    Status New    Target Date 01/01/21                 Plan - 12/07/20 1210    Clinical Impression Statement Patient educated on importance of compliance with daily exercises in  order to maximize improvement and prognosis. Fine motor skills as well as generalized strengthening will be beneficial for patient. He fatigues quickly with prolonged ambulation and low back pain is main limiting factor requiring a seated rest break during long ambulation task. Max cueing required for big steps and upright posture.  Patient will benefit from LSVT program to increase functional mobility, improve ADL performance, and improve quality of life.    Personal Factors and Comorbidities Age;Comorbidity 3+;Past/Current Experience;Fitness;Time since onset of injury/illness/exacerbation    Comorbidities L1 vertebral compression fx, AAA, anemia, essential tremor.    Examination-Activity Limitations Bathing;Bend;Caring for Others;Dressing;Hygiene/Grooming;Locomotion Level;Reach Overhead;Sit;Transfers;Stand;Stairs;Squat    Examination-Participation Restrictions Church;Cleaning;Community Activity;Driving;Interpersonal Relationship;Personal Finances;Meal Prep;Medication Management;Laundry;Shop;Volunteer;Yard Work    Conservation officer, historic buildingstability/Clinical Decision Making Evolving/Moderate complexity    Rehab Potential Fair    PT Frequency 4x / week    PT Duration Other (comment)   5 weeks   PT Treatment/Interventions ADLs/Self Care Home Management;Cryotherapy;Electrical Stimulation;Iontophoresis 4mg /ml Dexamethasone;Moist Heat;Traction;DME Instruction;Gait training;Stair training;Functional mobility training;Therapeutic activities;Therapeutic exercise;Balance training;Neuromuscular re-education;Manual techniques;Patient/family education;Passive range of motion;Dry needling;Vestibular;Canalith Repostioning;Taping;Visual/perceptual remediation/compensation;Energy conservation    PT Next Visit Plan LSVT program    PT Home Exercise Plan next session    Consulted and Agree with Plan of Care Patient;Family member/caregiver    Family Member Consulted wife           Patient will benefit from skilled therapeutic intervention in  order to improve the following deficits and impairments:  Abnormal gait,Cardiopulmonary status limiting activity,Decreased activity tolerance,Decreased balance,Decreased knowledge of precautions,Decreased endurance,Decreased coordination,Decreased knowledge of use of DME,Decreased mobility,Decreased safety awareness,Difficulty walking,Decreased strength,Impaired flexibility,Impaired perceived functional ability,Impaired tone,Impaired UE functional use,Postural dysfunction,Improper body mechanics,Pain  Visit Diagnosis: Other abnormalities of gait and mobility  Unsteadiness on feet  Unspecified lack of coordination     Problem List Patient Active Problem List   Diagnosis Date Noted  . Macular hole, right eye 03/18/2017   Precious BardMarina Anzley Dibbern, PT, DPT   12/07/2020, 12:11 PM  [facility] [address] [place], [place], [address] Phone: [phone]   Fax:  [phone]  Name: Nicolas Solis MRN: [id number] Date of Birth: 07/17/1951

## 2020-12-08 ENCOUNTER — Ambulatory Visit: Payer: Medicare Other | Admitting: Speech Pathology

## 2020-12-08 DIAGNOSIS — G20 Parkinson's disease: Secondary | ICD-10-CM

## 2020-12-08 DIAGNOSIS — R471 Dysarthria and anarthria: Secondary | ICD-10-CM

## 2020-12-08 NOTE — Patient Instructions (Signed)
LSVT Homework - use 7 out of 10 effort level when completing Complete 1 set

## 2020-12-08 NOTE — Therapy (Signed)
[place] [facility] MAIN [facility] SERVICES [address] [place], [place], [address] Phone: [phone]   Fax:  [phone]  Speech Language Pathology Treatment  Patient Details  Name: Nicolas Solis MRN: [id number] Date of Birth: 1950-09-15 Referring Provider (SLP): Dr. Sherryll Burger   Encounter Date: 12/08/2020   End of Session - 12/08/20 1223    Visit Number 4    Number of Visits 17    Date for SLP Re-Evaluation 02/25/21    Authorization Type MCR    Authorization - Visit Number 4    Progress Note Due on Visit 10    SLP Start Time 1000    SLP Stop Time  1100    SLP Time Calculation (min) 60 min    Activity Tolerance Patient tolerated treatment well;Other (comment)   mild light-headed during sustained "ah" but decreased throughout session          Past Medical History:  Diagnosis Date  . Compression fracture of first cervical vertebra (HCC)   . Essential tremor   . Ileus [facility])     Past Surgical History:  Procedure Laterality Date  . 25 GAUGE PARS PLANA VITRECTOMY WITH 20 GAUGE MVR PORT Right 03/18/2017  . 25 GAUGE PARS PLANA VITRECTOMY WITH 20 GAUGE MVR PORT FOR MACULAR HOLE Right 03/18/2017   Procedure: 25 GAUGE PARS PLANA VITRECTOMY WITH 20 GAUGE MVR PORT FOR MACULAR HOLE; PHOTOCOAGULATION RIGHT EYE;  Surgeon: Sherrie George, MD;  Location: [facility] OR;  Service: Ophthalmology;  Laterality: Right;  . APPENDECTOMY    . COLONOSCOPY W/ POLYPECTOMY    . GAS INSERTION Right 03/18/2017   Procedure: INSERTION OF GAS RIGHT (C3F8);  Surgeon: Sherrie George, MD;  Location: [facility] OR;  Service: Ophthalmology;  Laterality: Right;  . GAS/FLUID EXCHANGE Right 03/18/2017   Procedure: GAS/FLUID EXCHANGE RIGHT EYE;  Surgeon: Sherrie George, MD;  Location: [facility] OR;  Service: Ophthalmology;  Laterality: Right;  . JOINT REPLACEMENT Right    Hip  . MEMBRANE PEEL Right 03/18/2017   Procedure: MEMBRANE PEEL RIGHT EYE;  Surgeon: Sherrie George, MD;  Location[facility] OR;  Service:  Ophthalmology;  Laterality: Right;  . MOUTH SURGERY    . PHOTOCOAGULATION Left 03/18/2017   Procedure: PHOTOCOAGULATION LEFT EYE;  Surgeon: Sherrie George, MD;  Location: [facility] OR;  Service: Ophthalmology;  Laterality: Left;  . SERUM PATCH Right 03/18/2017   Procedure: SERUM PATCH RIGHT EYE;  Surgeon: Sherrie George, MD;  Location: [facility] OR;  Service: Ophthalmology;  Laterality: Right;    There were no vitals filed for this visit.   Subjective Assessment - 12/08/20 1211    Subjective increased affect when arriving to ST session    Currently in Pain? No/denies                 ADULT SLP TREATMENT - 12/08/20 1221      Cognitive-Linquistic Treatment   Skilled Treatment LSVT Daily tasks with modified independence (initial model). Loud /a/ averaged 84 dB, duration 5.4 seconds, pitch avg  218 Hz, moderate cues for endurance.  Max F0 for pitch glides was 278 Hz (85 dB avg), min F0 for low glides was 156 Hz (84 dB avg). Functional phrases averaged 84 dB with minimal cues for loudness; words/phrases 216 Hz (88 dB avg.); sentences 190 Hz (87 dB); sentence length riddles 174 Hz (86 dB) with minimal cues for loudness.  Modified Independent for carryover of good quality loud voicing in off-the-cuff remarks at 81 dB (178 Hz).  SLP Education - 12/08/20 1222    Education Details LSVT-LOUD, effort level, good quality loud voicing    Person(s) Educated Patient    Methods Explanation;Demonstration;Verbal cues;Handout    Comprehension Verbalized understanding;Returned demonstration;Need further instruction            SLP Short Term Goals - 11/28/20 1443      SLP SHORT TERM GOAL #1   Title The patient will complete Daily Tasks (Maximum duration "ah", High/Lows, and Functional Phrases) at average loudness >/= 80 dB and with loud, good quality voice with min cues.    Time 10    Period --   sessions   Status New      SLP SHORT TERM GOAL #2   Title The patient will complete Hierarchal  Speech Loudness reading drills (words/phrases, sentences) at average >/= 75 dB and with loud, good quality voice with min cues.    Time 10    Period --   sessions   Status New      SLP SHORT TERM GOAL #3   Title The patient will participate in 5-8 minutes conversation, maintaining average loudness of 75 dB and loud, good quality voice with min cues.    Time 10    Period --   sessions   Status New            SLP Long Term Goals - 11/28/20 1444      SLP LONG TERM GOAL #1   Title The patient will complete Daily Tasks (Maximum duration "ah", High/Lows, and Functional Phrases) at average loudness >/= 80 dB and with loud, good quality voice.    Time 4   or 17 sessions, for all LTGs   Period Weeks    Status New    Target Date 01/05/21      SLP LONG TERM GOAL #2   Title The patient will complete Hierarchal Speech Loudness reading drills (words/phrases, sentences, and paragraph) at average >/= 75 dB and with loud, good quality voice.    Time 4    Period Weeks    Status New    Target Date 01/05/21      SLP LONG TERM GOAL #3   Title The patient will participate in 15-20 minutes conversation, maintaining average loudness of 75 dB and loud, good quality voice.    Time 4    Period Weeks    Status New    Target Date 01/05/21      SLP LONG TERM GOAL #4   Title Patient will report improved communication effectiveness as measured by Communicative Effectiveness Survey.    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 Pt with increased loud voicing during off-the-cuff comments today!  Carryover Assignment in Homework targeted social interactions and use of riddles for sentence level  reading. Overall, pt continues to lack overall insight into hypokinetic dysarthria. Continue skilled ST to maximize carryover of loudness and vocal quality, improve functional communication and intelligibility for conversations with family, friends and in the community.    Speech Therapy Frequency 4x / week    Duration 4 weeks    Treatment/Interventions Cueing hierarchy;SLP instruction and feedback;Functional  tasks;Patient/family education    Potential to Achieve Goals Good    SLP Home Exercise Plan provided, see instruction section    Consulted and Agree with Plan of Care Patient           Patient will benefit from skilled therapeutic intervention in order to improve the following deficits and impairments:   Dysarthria and anarthria  Parkinson's disease [facility])    Problem List Patient Active Problem List   Diagnosis Date Noted  . Macular hole, right eye 03/18/2017   Korrina Zern B. Dreama Saa M.S., CCC-SLP, [facility] Speech-Language Pathologist Rehabilitation Services Office [phone]  Reuel Derby 12/08/2020, 12:26 PM  [place] [facility] MAIN [facility] SERVICES [address] [place], [place], [address] Phone: [phone]   Fax:  [phone]   Name: Nicolas Solis MRN: [id number] Date of Birth: 1951/06/29

## 2020-12-08 NOTE — Therapy (Addendum)
[place] [facility] MAIN [facility] SERVICES [address] [place], [place], [address] Phone: [phone]   Fax:  [phone]  Speech Language Pathology Treatment  Patient Details  Name: Nicolas Solis MRN: [id number] Date of Birth: [birth year] Referring Provider (SLP): Dr. Sherryll Burger   Encounter Date: 12/07/2020   End of Session - 12/08/20 1154    Visit Number 3    Number of Visits 17    Date for SLP Re-Evaluation 02/25/21    Authorization Type MCR    Authorization - Visit Number 3    Progress Note Due on Visit 10    SLP Start Time 1200    SLP Stop Time  1300    SLP Time Calculation (min) 60 min    Activity Tolerance Patient tolerated treatment well;Other (comment)   reports being light-headed after sustaining "ah"          Past Medical History:  Diagnosis Date  . Compression fracture of first cervical vertebra (HCC)   . Essential tremor   . Ileus [facility])     Past Surgical History:  Procedure Laterality Date  . 25 GAUGE PARS PLANA VITRECTOMY WITH 20 GAUGE MVR PORT Right 03/18/2017  . 25 GAUGE PARS PLANA VITRECTOMY WITH 20 GAUGE MVR PORT FOR MACULAR HOLE Right 03/18/2017   Procedure: 25 GAUGE PARS PLANA VITRECTOMY WITH 20 GAUGE MVR PORT FOR MACULAR HOLE; PHOTOCOAGULATION RIGHT EYE;  Surgeon: Sherrie George, MD;  Location: [facility] OR;  Service: Ophthalmology;  Laterality: Right;  . APPENDECTOMY    . COLONOSCOPY W/ POLYPECTOMY    . GAS INSERTION Right 03/18/2017   Procedure: INSERTION OF GAS RIGHT (C3F8);  Surgeon: Sherrie George, MD;  Location: [facility] OR;  Service: Ophthalmology;  Laterality: Right;  . GAS/FLUID EXCHANGE Right 03/18/2017   Procedure: GAS/FLUID EXCHANGE RIGHT EYE;  Surgeon: Sherrie George, MD;  Location: [facility] OR;  Service: Ophthalmology;  Laterality: Right;  . JOINT REPLACEMENT Right    Hip  . MEMBRANE PEEL Right 03/18/2017   Procedure: MEMBRANE PEEL RIGHT EYE;  Surgeon: Sherrie George, MD;  Location: [facility] OR;  Service: Ophthalmology;   Laterality: Right;  . MOUTH SURGERY    . PHOTOCOAGULATION Left 03/18/2017   Procedure: PHOTOCOAGULATION LEFT EYE;  Surgeon: Sherrie George, MD;  Location: [facility] OR;  Service: Ophthalmology;  Laterality: Left;  . SERUM PATCH Right 03/18/2017   Procedure: SERUM PATCH RIGHT EYE;  Surgeon: Sherrie George, MD;  Location: [facility] OR;  Service: Ophthalmology;  Laterality: Right;    There were no vitals filed for this visit.   Subjective Assessment - 12/08/20 1152    Subjective Flat affect, "I was too lazy to do the homework, I had Rock Steady yesterday afternoon"    Currently in Pain? No/denies                 ADULT SLP TREATMENT - 12/08/20 0001      Cognitive-Linquistic Treatment   Skilled Treatment LSVT Daily tasks with modified independence (initial model). Loud /a/ averaged 84 dB, duration 6.3 seconds, pitch avg  221 Hz.  Max F0 for pitch glides was 268 Hz (87 dB avg), min F0 for low glides was 143 Hz (85 dB avg). Functional phrases averaged 84 dB with minimal cues; words/phrases 232 Hz (88 dB avg.)  Moderate cues required for carryover of good quality loud voicing d/t off-the-cuff remarks at 77dB.            SLP Education - 12/08/20 1154  Education Details LSVT-LOUD, effort level, good quality loud voicing    Person(s) Educated Patient    Methods Explanation;Demonstration;Verbal cues;Handout    Comprehension Verbalized understanding;Returned demonstration;Need further instruction            SLP Short Term Goals - 11/28/20 1443      SLP SHORT TERM GOAL #1   Title The patient will complete Daily Tasks (Maximum duration "ah", High/Lows, and Functional Phrases) at average loudness >/= 80 dB and with loud, good quality voice with min cues.    Time 10    Period --   sessions   Status New      SLP SHORT TERM GOAL #2   Title The patient will complete Hierarchal Speech Loudness reading drills (words/phrases, sentences) at average >/= 75 dB and with loud, good quality voice with  min cues.    Time 10    Period --   sessions   Status New      SLP SHORT TERM GOAL #3   Title The patient will participate in 5-8 minutes conversation, maintaining average loudness of 75 dB and loud, good quality voice with min cues.    Time 10    Period --   sessions   Status New            SLP Long Term Goals - 11/28/20 1444      SLP LONG TERM GOAL #1   Title The patient will complete Daily Tasks (Maximum duration "ah", High/Lows, and Functional Phrases) at average loudness >/= 80 dB and with loud, good quality voice.    Time 4   or 17 sessions, for all LTGs   Period Weeks    Status New    Target Date 01/05/21      SLP LONG TERM GOAL #2   Title The patient will complete Hierarchal Speech Loudness reading drills (words/phrases, sentences, and paragraph) at average >/= 75 dB and with loud, good quality voice.    Time 4    Period Weeks    Status New    Target Date 01/05/21      SLP LONG TERM GOAL #3   Title The patient will participate in 15-20 minutes conversation, maintaining average loudness of 75 dB and loud, good quality voice.    Time 4    Period Weeks    Status New    Target Date 01/05/21      SLP LONG TERM GOAL #4   Title Patient will report improved communication effectiveness as measured by Communicative Effectiveness Survey.    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 Pt continues to require minimal assistance to condition to LSVT activities. He reports exerting a 7 out of 10 effort level to achieve targets within hierarchical tasks. In addition, he was able to use loud voice when immediately speaking to receptionist. Carryover Assignment in Homework  targeted social interactions. Overall, pt lacks insight into current diagnosis and hypokinetic dysarthria. Continue skilled ST to maximize carryover of loudness and vocal quality, improve functional communication and intelligibility for conversations with family, friends and in the community.    Speech Therapy Frequency 4x / week  Duration 4 weeks    Treatment/Interventions Cueing hierarchy;SLP instruction and feedback;Functional tasks;Patient/family education    Potential to Achieve Goals Good    SLP Home Exercise Plan provided, see instruction section    Consulted and Agree with Plan of Care Patient           Patient will benefit from skilled therapeutic intervention in order to improve the following deficits and impairments:   Dysarthria and anarthria  Parkinson's disease [facility])    Problem List Patient Active Problem List   Diagnosis Date Noted  . Macular hole, right eye 03/18/2017   Mikael Debell B. Dreama Saa M.S., CCC-SLP, [facility] Speech-Language Pathologist Rehabilitation Services Office [phone]  Reuel Derby 12/08/2020, 12:08 PM  [place] [facility] MAIN [facility] SERVICES [address] [place], [place], [address] Phone: [phone]   Fax:  [phone]   Name: REGNALD BOWENS MRN: [id number] Date of Birth: 1950/08/08

## 2020-12-08 NOTE — Patient Instructions (Addendum)
Complete LSVT homework thru riddles

## 2020-12-11 ENCOUNTER — Other Ambulatory Visit: Payer: Self-pay

## 2020-12-11 ENCOUNTER — Ambulatory Visit: Payer: Medicare Other | Admitting: Speech Pathology

## 2020-12-11 ENCOUNTER — Ambulatory Visit: Payer: Medicare Other

## 2020-12-11 DIAGNOSIS — R2681 Unsteadiness on feet: Secondary | ICD-10-CM

## 2020-12-11 DIAGNOSIS — G20 Parkinson's disease: Secondary | ICD-10-CM

## 2020-12-11 DIAGNOSIS — R2689 Other abnormalities of gait and mobility: Secondary | ICD-10-CM

## 2020-12-11 DIAGNOSIS — R471 Dysarthria and anarthria: Secondary | ICD-10-CM

## 2020-12-11 DIAGNOSIS — R279 Unspecified lack of coordination: Secondary | ICD-10-CM

## 2020-12-11 NOTE — Therapy (Signed)
[place] [facility] MAIN [facility] SERVICES [address] [place], [place], [address] Phone: [phone]   Fax:  [phone]  Physical Therapy Treatment  Patient Details  Name: Nicolas GallusRichard C Solis MRN: [id number] Date of Birth: 12/20/1950 Referring Provider (PT): [name], Hemang   Encounter Date: 12/11/2020   PT End of Session - 12/11/20 1142    Visit Number 6    Number of Visits 17    Date for PT Re-Evaluation 01/01/21    PT Start Time 1101    PT Stop Time 1158    PT Time Calculation (min) 57 min    Equipment Utilized During Treatment Gait belt    Activity Tolerance Patient tolerated treatment well    Behavior During Therapy WFL for tasks assessed/performed           Past Medical History:  Diagnosis Date  . Compression fracture of first cervical vertebra (HCC)   . Essential tremor   . Ileus [facility](HCC)     Past Surgical History:  Procedure Laterality Date  . 25 GAUGE PARS PLANA VITRECTOMY WITH 20 GAUGE MVR PORT Right 03/18/2017  . 25 GAUGE PARS PLANA VITRECTOMY WITH 20 GAUGE MVR PORT FOR MACULAR HOLE Right 03/18/2017   Procedure: 25 GAUGE PARS PLANA VITRECTOMY WITH 20 GAUGE MVR PORT FOR MACULAR HOLE; PHOTOCOAGULATION RIGHT EYE;  Surgeon: [name], [name], MD;  [facility];  Service: Ophthalmology;  Laterality: Right;  . APPENDECTOMY    . COLONOSCOPY W/ POLYPECTOMY    . GAS INSERTION Right 03/18/2017   Procedure: INSERTION OF GAS RIGHT (C3F8);  Surgeon: [name], [name], MD;  [facility];  Service: Ophthalmology;  Laterality: Right;  . GAS/FLUID EXCHANGE Right 03/18/2017   Procedure: GAS/FLUID EXCHANGE RIGHT EYE;  Surgeon: [name], [name], MD;  [facility];  Service: Ophthalmology;  Laterality: Right;  . JOINT REPLACEMENT Right    Hip  . MEMBRANE PEEL Right 03/18/2017   Procedure: MEMBRANE PEEL RIGHT EYE;  Surgeon: [name], [name], MD;  [facility];  Service: Ophthalmology;  Laterality: Right;  . MOUTH SURGERY    . PHOTOCOAGULATION Left  03/18/2017   Procedure: PHOTOCOAGULATION LEFT EYE;  Surgeon: [name], [name], MD;  [facility];  Service: Ophthalmology;  Laterality: Left;  . SERUM PATCH Right 03/18/2017   Procedure: SERUM PATCH RIGHT EYE;  Surgeon: [name], [name], MD;  [facility];  Service: Ophthalmology;  Laterality: Right;    There were no vitals filed for this visit.   Subjective Assessment - 12/11/20 1137    Subjective Patient reports he did his exercises but didn't do all 10 reps of each exercise. Has rock steady tonight    Patient is accompained by: Family member    Pertinent History Patient is a 70 year old male who presents for LSVT BIG and LOUD evaluation. Patient recently diagnosed with Parkinson's disease (05/2020) and is Sinemet. He has increased urinary frequency and nocturia as well as low back pain with ambulation. Cogwheeling with his R more than L is noted per neurologist. [place] [employer] [place]  Has been using a cane since 2017 and now is on a walker. PMH includes L1 vertebral compression fx, AAA, anemia, essential tremor.    Limitations Lifting;Standing;Walking;House hold activities;Other (comment)    How long can you sit comfortably? R hip pain    How long can you stand comfortably? haven't tried: needs to hold on 2 minutes    How long can you walk comfortably?  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    Patient Stated Goals to be able to walk better    Currently in Pain? No/denies                  Treatment:   Patient seen for LSVT Daily Session Adapted Daily Exercises for facilitation/coordination of movement Sustained movements are designed to rescale the amplitude of movement output for generalization to daily functional activities .Performed as follows for 1 set of 10 repetitions each multidirectional sustained movements  1) Floor to  ceiling , cues to hold for 10 seconds, needs modeling for correct positions , VC to reach out further and to reach up to the ceiling 2) Side to side multidirectional Repetitive movements performed in sitting and are designed to provide retraining effort needed for sustained muscle activation in tasks , cues to lean fwd and hold position x 10 counts: Patient does not move forward to side sitting to forward smoothly and is not able to transition or stretch out leg very far 3) Step and reach forward , cues for good knee flex, cues to move UE and LE together, cues to rotate  arms up to pronate forearms while holding onto chair 4) Step and reach sideways, cues to turn  head sideways, large step sideways and turn head back to neutral, and to rotate arms and pronate forearms while holding onto chair with opposite hand.  5) Step and reach backwards, cues for SUE back, getting toe up and bending back knee while holding onto chair and then swinging arm forward and stomping  6) Rock and reach forward/backward, cues to reach far fwd with UE, patient not comfortable with feet apart , not rocking very far to get a good weight shift, not able to raise  heel or raise  toes much with SUE on chair.  7) Rock and reach sideways, cues for twist and look behind , only rotates sideways, unable to twist around or turn to look behind with one hand on chair   functional component task with supervision 5 reps and simulated activities for: 1. Sit to stand x 5   needs cues for nose over toes and explosive movement.  2. signing name 3.put pant leg on/off 4.tall posture walking 5. Large steps walking:    Specific task training: sign name 5x on paper with standard size pen pull leg onto knee 5x each side to increase flexibility bilaterally tweezer dexterity  peg board L and RUE for coordination and fine motor skills Write alphabet with focus on equal letter height and size Tie shoelace 5x on hiking boot off in patient lap Pull  pants to knee 5x each LE    Long ambulation for 460 ft with walking sticks, cueing for large steps and upright posture; one seated rest break. ambulate across stable and unstable surface outside. Negotiating changing surfaces from grass to sidewalk, across brick with turns and obstacles in pathway without LOB.     Pt educated throughout session about proper posture and technique with exercises. Improved exercise technique, movement at target joints, use of target muscles after min to mod verbal, visual, tactile cues.    Patient needs cueing for BIG swing and BIG amplitude.  Patient needs cueing for correct BIG arm swing. Patient improves with practice. Patient needs cuing to consistently swing his arms and also swing reciprocally. Patient needs cuing to  swing UE's reciprocally with weight shift exercise and with backward stepping and correct UE positioning.   Patient ambulated outside across stable and unstable surfaces with walking sticks. Back pain limits full length of ambulation this session. Education on need for compliance with HEP and daily tasks. Large steps were shortened in busy cafeteria region with multiple people nearby but able to correct with verbal cueing as well as tactile cueing for upright posture. Patient will benefit from LSVT program to increase functional mobility, improve ADL performance, and improve quality of life.                      PT Education - 12/11/20 1139    Education Details exercise technique, compliance with daily exercises    Person(s) Educated Patient    Methods Explanation;Demonstration;Tactile cues;Verbal cues    Comprehension Verbalized understanding;Returned demonstration;Verbal cues required;Tactile cues required            PT Short Term Goals - 11/27/20 1331      PT SHORT TERM GOAL #1   Title Patient will be independent in home exercise program to improve strength/mobility for better functional independence with ADLs.    Time 2     Period Weeks    Status New    Target Date 12/11/20      PT SHORT TERM GOAL #2   Title Patient will be able to stand 5 x from standard height chair without UE support to increase functional mobility    Baseline 5/2: requires use of BUE support    Time 2    Period Weeks    Status New    Target Date 12/11/20             PT Long Term Goals - 11/27/20 1332      PT LONG TERM GOAL #1   Title Patient will increase FOTO score to equal to or greater than  53%   to demonstrate statistically significant improvement in mobility and quality of life.    Baseline 5/2: 40%    Time 5    Period Weeks    Status New    Target Date 01/01/21      PT LONG TERM GOAL #2   Title Patient (> 35 years old) will complete five times sit to stand test in < 15 seconds without UE support indicating an increased LE strength and improved balance.    Baseline 5/2: unable to perform w/o UE support 15.35 seconds with heavy BUE support    Time 5    Period Weeks    Status New    Target Date 01/01/21      PT LONG TERM GOAL #3   Title Patient will increase Berg Balance score by > 6 points (35/56)  to demonstrate decreased fall risk during functional activities.    Baseline 5/2: 29/56    Time 4    Period Weeks    Status New    Target Date 12/25/20      PT LONG TERM GOAL #4   Title Patient will reduce timed up and go to <11 seconds to reduce fall risk and demonstrate improved transfer/gait ability.    Baseline 5/2: 14.21    Time 5    Period Weeks    Status New    Target Date 01/01/21      PT LONG TERM GOAL #5   Title Patient will be modified independent in walking on even/uneven surface with least restrictive assistive device, for 20+ minutes  without rest break, reporting some difficulty or less to improve walking tolerance with community ambulation including grocery shopping, going to church,etc.    Baseline 5/2: at home can do 10 minutes with breaks    Time 5    Period Weeks    Status New    Target  Date 01/01/21                 Plan - 12/11/20 1153    Clinical Impression Statement Patient ambulated outside across stable and unstable surfaces with walking sticks. Back pain limits full length of ambulation this session. Education on need for compliance with HEP and daily tasks. Large steps were shortened in busy cafeteria region with multiple people nearby but able to correct with verbal cueing as well as tactile cueing for upright posture. Patient will benefit from LSVT program to increase functional mobility, improve ADL performance, and improve quality of life.    Personal Factors and Comorbidities Age;Comorbidity 3+;Past/Current Experience;Fitness;Time since onset of injury/illness/exacerbation    Comorbidities L1 vertebral compression fx, AAA, anemia, essential tremor.    Examination-Activity Limitations Bathing;Bend;Caring for Others;Dressing;Hygiene/Grooming;Locomotion Level;Reach Overhead;Sit;Transfers;Stand;Stairs;Squat    Examination-Participation Restrictions Church;Cleaning;Community Activity;Driving;Interpersonal Relationship;Personal Finances;Meal Prep;Medication Management;Laundry;Shop;Volunteer;Yard Work    Conservation officer, historic buildings Evolving/Moderate complexity    Rehab Potential Fair    PT Frequency 4x / week    PT Duration Other (comment)   5 weeks   PT Treatment/Interventions ADLs/Self Care Home Management;Cryotherapy;Electrical Stimulation;Iontophoresis 4mg /ml Dexamethasone;Moist Heat;Traction;DME Instruction;Gait training;Stair training;Functional mobility training;Therapeutic activities;Therapeutic exercise;Balance training;Neuromuscular re-education;Manual techniques;Patient/family education;Passive range of motion;Dry needling;Vestibular;Canalith Repostioning;Taping;Visual/perceptual remediation/compensation;Energy conservation    PT Next Visit Plan LSVT program    PT Home Exercise Plan next session    Consulted and Agree with Plan of Care Patient;Family  member/caregiver    Family Member Consulted wife           Patient will benefit from skilled therapeutic intervention in order to improve the following deficits and impairments:  Abnormal gait,Cardiopulmonary status limiting activity,Decreased activity tolerance,Decreased balance,Decreased knowledge of precautions,Decreased endurance,Decreased coordination,Decreased knowledge of use of DME,Decreased mobility,Decreased safety awareness,Difficulty walking,Decreased strength,Impaired flexibility,Impaired perceived functional ability,Impaired tone,Impaired UE functional use,Postural dysfunction,Improper body mechanics,Pain  Visit Diagnosis: Other abnormalities of gait and mobility  Unsteadiness on feet  Unspecified lack of coordination     Problem List Patient Active Problem List   Diagnosis Date Noted  . Macular hole, right eye 03/18/2017   03/20/2017, PT, DPT   12/11/2020, 12:47 PM  [place] [facility] MAIN [facility] SERVICES [address] [place], [place], [place] Phone: [phone]   Fax:  [phone]  Name: KITO CUFFE MRN: Nicolas Solis Date of Birth: 05/21/1951

## 2020-12-11 NOTE — Therapy (Signed)
[place] [facility] MAIN [facility] SERVICES [address] [place], [place], [address] Phone: [phone]   Fax:  [phone]  Speech Language Pathology Treatment  Patient Details  Name: Nicolas Solis MRN: [id number] Date of Birth: 03-31-1951 Referring Provider (SLP): Dr. Sherryll Burger   Encounter Date: 12/11/2020   End of Session - 12/11/20 1341    Visit Number 5    Number of Visits 17    Date for SLP Re-Evaluation 02/25/21    Authorization Type MCR    Authorization - Visit Number 5    Progress Note Due on Visit 10    SLP Start Time 1002    SLP Stop Time  1101    SLP Time Calculation (min) 59 min    Activity Tolerance Patient tolerated treatment well           Past Medical History:  Diagnosis Date  . Compression fracture of first cervical vertebra (HCC)   . Essential tremor   . Ileus [facility])     Past Surgical History:  Procedure Laterality Date  . 25 GAUGE PARS PLANA VITRECTOMY WITH 20 GAUGE MVR PORT Right 03/18/2017  . 25 GAUGE PARS PLANA VITRECTOMY WITH 20 GAUGE MVR PORT FOR MACULAR HOLE Right 03/18/2017   Procedure: 25 GAUGE PARS PLANA VITRECTOMY WITH 20 GAUGE MVR PORT FOR MACULAR HOLE; PHOTOCOAGULATION RIGHT EYE;  Surgeon: Sherrie George, MD;  Location: [facility] OR;  Service: Ophthalmology;  Laterality: Right;  . APPENDECTOMY    . COLONOSCOPY W/ POLYPECTOMY    . GAS INSERTION Right 03/18/2017   Procedure: INSERTION OF GAS RIGHT (C3F8);  Surgeon: Sherrie George, MD;  Location: [facility] OR;  Service: Ophthalmology;  Laterality: Right;  . GAS/FLUID EXCHANGE Right 03/18/2017   Procedure: GAS/FLUID EXCHANGE RIGHT EYE;  Surgeon: Sherrie George, MD;  Location: [facility] OR;  Service: Ophthalmology;  Laterality: Right;  . JOINT REPLACEMENT Right    Hip  . MEMBRANE PEEL Right 03/18/2017   Procedure: MEMBRANE PEEL RIGHT EYE;  Surgeon: Sherrie George, MD;  Location: [facility] OR;  Service: Ophthalmology;  Laterality: Right;  . MOUTH SURGERY    . PHOTOCOAGULATION Left  03/18/2017   Procedure: PHOTOCOAGULATION LEFT EYE;  Surgeon: Sherrie George, MD;  Location: [facility] OR;  Service: Ophthalmology;  Laterality: Left;  . SERUM PATCH Right 03/18/2017   Procedure: SERUM PATCH RIGHT EYE;  Surgeon: Sherrie George, MD;  Location: [facility] OR;  Service: Ophthalmology;  Laterality: Right;    There were no vitals filed for this visit.   Subjective Assessment - 12/11/20 1338    Subjective "I drove."    Currently in Pain? No/denies                 ADULT SLP TREATMENT - 12/11/20 1339      General Information   Behavior/Cognition Alert;Cooperative;Other (comment)   affect flat initially   HPI Nicolas Solis is a 70 y.o. male referred by Dr. Sherryll Burger for LSVT-LOUD therapy due to decreased volume of speech secondary to Parkinson's disease, diagnosed November 2021.      Treatment Provided   Treatment provided Cognitive-Linquistic      Pain Assessment   Pain Assessment No/denies pain      Cognitive-Linquistic Treatment   Treatment focused on Dysarthria;Patient/family/caregiver education    Skilled Treatment LSVT Daily tasks with moderate cues for pitch, loudness, and mouth opening. Loud /a/ averaged 84 dB, duration 5.0 seconds, pitch avg 200 Hz.  Max F0 for pitch glides was 263 Hz (87 dB  avg), min F0 for low glides was 128 Hz (85 dB avg). Pt required cues for /a/ vs /u/. Functional phrases averaged 87 dB with min cues. Phrase and sentence level responses with cognitive load averaged 82 dB with occasional mod cues. Spontaneous responses between tasks averaged 77 dB with moderate cues for loudness.      Assessment / Recommendations / Plan   Plan Continue with current plan of care      Progression Toward Goals   Progression toward goals Progressing toward goals            SLP Education - 12/11/20 1340    Education Details effort level needed to be loud enough in conversation    Person(s) Educated Patient    Methods Explanation;Demonstration;Verbal cues     Comprehension Verbalized understanding;Verbal cues required;Need further instruction            SLP Short Term Goals - 11/28/20 1443      SLP SHORT TERM GOAL #1   Title The patient will complete Daily Tasks (Maximum duration "ah", High/Lows, and Functional Phrases) at average loudness >/= 80 dB and with loud, good quality voice with min cues.    Time 10    Period --   sessions   Status New      SLP SHORT TERM GOAL #2   Title The patient will complete Hierarchal Speech Loudness reading drills (words/phrases, sentences) at average >/= 75 dB and with loud, good quality voice with min cues.    Time 10    Period --   sessions   Status New      SLP SHORT TERM GOAL #3   Title The patient will participate in 5-8 minutes conversation, maintaining average loudness of 75 dB and loud, good quality voice with min cues.    Time 10    Period --   sessions   Status New            SLP Long Term Goals - 11/28/20 1444      SLP LONG TERM GOAL #1   Title The patient will complete Daily Tasks (Maximum duration "ah", High/Lows, and Functional Phrases) at average loudness >/= 80 dB and with loud, good quality voice.    Time 4   or 17 sessions, for all LTGs   Period Weeks    Status New    Target Date 01/05/21      SLP LONG TERM GOAL #2   Title The patient will complete Hierarchal Speech Loudness reading drills (words/phrases, sentences, and paragraph) at average >/= 75 dB and with loud, good quality voice.    Time 4    Period Weeks    Status New    Target Date 01/05/21      SLP LONG TERM GOAL #3   Title The patient will participate in 15-20 minutes conversation, maintaining average loudness of 75 dB and loud, good quality voice.    Time 4    Period Weeks    Status New    Target Date 01/05/21      SLP LONG TERM GOAL #4   Title Patient will report improved communication effectiveness as measured by Communicative Effectiveness Survey.    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 Patient continues to improve carryover of loudness in spontaneous responses after hierarchical loudness drills. Overall, pt continues to lack overall insight into hypokinetic dysarthria. Continue skilled ST to maximize carryover of loudness and vocal quality, improve functional communication and intelligibility for conversations with family, friends and in the community.    Speech Therapy Frequency 4x / week    Duration 4 weeks    Treatment/Interventions Cueing hierarchy;SLP instruction and feedback;Functional tasks;Patient/family education    Potential to Achieve Goals Good    SLP Home Exercise Plan provided, see instruction section    Consulted and Agree with Plan of Care Patient           Patient will benefit from skilled therapeutic intervention in order to improve the following deficits and impairments:   Dysarthria and anarthria  Parkinson's disease [facility])    Problem List Patient Active Problem List   Diagnosis Date Noted  . Macular hole, right eye 03/18/2017   Rondel Baton, MS, CCC-SLP Speech-Language Pathologist  Arlana Lindau 12/11/2020, 1:43 PM  [place] [facility] MAIN [facility] SERVICES [address] [place], [place], [address] Phone: [phone]   Fax:  [phone]   Name: PHU RECORD MRN: [id number] Date of Birth: [birth year]

## 2020-12-12 ENCOUNTER — Ambulatory Visit: Payer: Medicare Other | Admitting: Speech Pathology

## 2020-12-12 ENCOUNTER — Ambulatory Visit: Payer: Medicare Other

## 2020-12-12 DIAGNOSIS — R471 Dysarthria and anarthria: Secondary | ICD-10-CM

## 2020-12-12 DIAGNOSIS — R279 Unspecified lack of coordination: Secondary | ICD-10-CM

## 2020-12-12 DIAGNOSIS — R2681 Unsteadiness on feet: Secondary | ICD-10-CM

## 2020-12-12 DIAGNOSIS — R2689 Other abnormalities of gait and mobility: Secondary | ICD-10-CM

## 2020-12-12 DIAGNOSIS — G20 Parkinson's disease: Secondary | ICD-10-CM

## 2020-12-12 NOTE — Therapy (Signed)
[place] [facility] MAIN [facility] SERVICES [address] [place], [place], [address] Phone: [phone]   Fax:  [phone]  Speech Language Pathology Treatment  Patient Details  Name: Nicolas Solis MRN: [id number] Date of Birth: Aug 31, 1950 Referring Provider (SLP): Dr. Sherryll Burger   Encounter Date: 12/12/2020   End of Session - 12/12/20 1557    Visit Number 6    Number of Visits 17    Date for SLP Re-Evaluation 02/25/21    Authorization Type MCR    Authorization - Visit Number 6    Progress Note Due on Visit 10    SLP Start Time 1000    SLP Stop Time  1100    SLP Time Calculation (min) 60 min    Activity Tolerance Patient tolerated treatment well           Past Medical History:  Diagnosis Date  . Compression fracture of first cervical vertebra (HCC)   . Essential tremor   . Ileus [facility])     Past Surgical History:  Procedure Laterality Date  . 25 GAUGE PARS PLANA VITRECTOMY WITH 20 GAUGE MVR PORT Right 03/18/2017  . 25 GAUGE PARS PLANA VITRECTOMY WITH 20 GAUGE MVR PORT FOR MACULAR HOLE Right 03/18/2017   Procedure: 25 GAUGE PARS PLANA VITRECTOMY WITH 20 GAUGE MVR PORT FOR MACULAR HOLE; PHOTOCOAGULATION RIGHT EYE;  Surgeon: Sherrie George, MD;  Location: [facility] OR;  Service: Ophthalmology;  Laterality: Right;  . APPENDECTOMY    . COLONOSCOPY W/ POLYPECTOMY    . GAS INSERTION Right 03/18/2017   Procedure: INSERTION OF GAS RIGHT (C3F8);  Surgeon: Sherrie George, MD;  Location: [facility] OR;  Service: Ophthalmology;  Laterality: Right;  . GAS/FLUID EXCHANGE Right 03/18/2017   Procedure: GAS/FLUID EXCHANGE RIGHT EYE;  Surgeon: Sherrie George, MD;  Location: [facility] OR;  Service: Ophthalmology;  Laterality: Right;  . JOINT REPLACEMENT Right    Hip  . MEMBRANE PEEL Right 03/18/2017   Procedure: MEMBRANE PEEL RIGHT EYE;  Surgeon: Sherrie George, MD;  Location: [facility] OR;  Service: Ophthalmology;  Laterality: Right;  . MOUTH SURGERY    . PHOTOCOAGULATION Left  03/18/2017   Procedure: PHOTOCOAGULATION LEFT EYE;  Surgeon: Sherrie George, MD;  Location: [facility] OR;  Service: Ophthalmology;  Laterality: Left;  . SERUM PATCH Right 03/18/2017   Procedure: SERUM PATCH RIGHT EYE;  Surgeon: Sherrie George, MD;  Location: [facility] OR;  Service: Ophthalmology;  Laterality: Right;    There were no vitals filed for this visit.   Subjective Assessment - 12/12/20 1547    Subjective Greeted SLP in low voice.    Currently in Pain? No/denies                 ADULT SLP TREATMENT - 12/12/20 1548      General Information   Behavior/Cognition Alert;Cooperative    HPI Nicolas Solis is a 70 y.o. male referred by Dr. Sherryll Burger for LSVT-LOUD therapy due to decreased volume of speech secondary to Parkinson's disease, diagnosed November 2021.      Treatment Provided   Treatment provided Cognitive-Linquistic      Cognitive-Linquistic Treatment   Treatment focused on Dysarthria;Patient/family/caregiver education    Skilled Treatment LSVT Daily tasks with min-mod cues for pitch, loudness, and mouth opening. Loud /a/ averaged 85 dB, duration 5.5 seconds, pitch avg 193 Hz.  Max F0 for pitch glides was 270 Hz (88 dB avg), min F0 for low glides was 144 Hz (88 dB avg). Functional phrases averaged  88 dB with modified independence. Progressed to paragraph level reading tasks, min cues for loudness, average 85 dB. Spontaneous responses between tasks ranged 76-79 dB, improving to 83-85 dB with min cues.      Assessment / Recommendations / Plan   Plan Continue with current plan of care      Progression Toward Goals   Progression toward goals Progressing toward goals            SLP Education - 12/12/20 1557    Education Details use loud voice all the time    Person(s) Educated Patient    Methods Explanation    Comprehension Verbalized understanding;Need further instruction;Verbal cues required            SLP Short Term Goals - 11/28/20 1443      SLP SHORT TERM GOAL #1    Title The patient will complete Daily Tasks (Maximum duration "ah", High/Lows, and Functional Phrases) at average loudness >/= 80 dB and with loud, good quality voice with min cues.    Time 10    Period --   sessions   Status New      SLP SHORT TERM GOAL #2   Title The patient will complete Hierarchal Speech Loudness reading drills (words/phrases, sentences) at average >/= 75 dB and with loud, good quality voice with min cues.    Time 10    Period --   sessions   Status New      SLP SHORT TERM GOAL #3   Title The patient will participate in 5-8 minutes conversation, maintaining average loudness of 75 dB and loud, good quality voice with min cues.    Time 10    Period --   sessions   Status New            SLP Long Term Goals - 11/28/20 1444      SLP LONG TERM GOAL #1   Title The patient will complete Daily Tasks (Maximum duration "ah", High/Lows, and Functional Phrases) at average loudness >/= 80 dB and with loud, good quality voice.    Time 4   or 17 sessions, for all LTGs   Period Weeks    Status New    Target Date 01/05/21      SLP LONG TERM GOAL #2   Title The patient will complete Hierarchal Speech Loudness reading drills (words/phrases, sentences, and paragraph) at average >/= 75 dB and with loud, good quality voice.    Time 4    Period Weeks    Status New    Target Date 01/05/21      SLP LONG TERM GOAL #3   Title The patient will participate in 15-20 minutes conversation, maintaining average loudness of 75 dB and loud, good quality voice.    Time 4    Period Weeks    Status New    Target Date 01/05/21      SLP LONG TERM GOAL #4   Title Patient will report improved communication effectiveness as measured by Communicative Effectiveness Survey.    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 Patient continues to improve carryover of loudness  in spontaneous responses after hierarchical loudness drills; he was able to maintain for short responses but required cues for longer responses. Continue skilled ST to maximize carryover of loudness and vocal quality, improve functional communication and intelligibility for conversations with family, friends and in the community.    Speech Therapy Frequency 4x / week    Duration 4 weeks    Treatment/Interventions Cueing hierarchy;SLP instruction and feedback;Functional tasks;Patient/family education    Potential to Achieve Goals Good    SLP Home Exercise Plan provided, see instruction section    Consulted and Agree with Plan of Care Patient           Patient will benefit from skilled therapeutic intervention in order to improve the following deficits and impairments:   Dysarthria and anarthria  Parkinson's disease [facility])    Problem List Patient Active Problem List   Diagnosis Date Noted  . Macular hole, right eye 03/18/2017   Rondel Baton, MS, CCC-SLP Speech-Language Pathologist  Arlana Lindau 12/12/2020, 4:00 PM  [place] [facility] MAIN [facility] SERVICES [address] [place], [place], [address] Phone: [phone]   Fax:  [phone]   Name: Nicolas Solis MRN: [id number] Date of Birth: 08/27/1950

## 2020-12-12 NOTE — Therapy (Signed)
[facility] MAIN [facility] SERVICES [address] [place], [place], [address] Phone: [phone]   Fax:  [phone]  Physical Therapy Treatment  Patient Details  Name: Nicolas Solis MRN: [id number] Date of Birth: 06-16-1951 Referring Provider (PT): Cristopher Peru   Encounter Date: 12/12/2020   PT End of Session - 12/12/20 1002    Visit Number 7    Number of Visits 17    Date for PT Re-Evaluation 01/01/21    PT Start Time 1100    PT Stop Time 1159    PT Time Calculation (min) 59 min    Equipment Utilized During Treatment Gait belt    Activity Tolerance Patient tolerated treatment well    Behavior During Therapy WFL for tasks assessed/performed           Past Medical History:  Diagnosis Date  . Compression fracture of first cervical vertebra (HCC)   . Essential tremor   . [facility])     Past Surgical History:  Procedure Laterality Date  . 25 GAUGE PARS PLANA VITRECTOMY WITH 20 GAUGE MVR PORT Right 03/18/2017  . 25 GAUGE PARS PLANA VITRECTOMY WITH 20 GAUGE MVR PORT FOR MACULAR HOLE Right 03/18/2017   Procedure: 25 GAUGE PARS PLANA VITRECTOMY WITH 20 GAUGE MVR PORT FOR MACULAR HOLE; PHOTOCOAGULATION RIGHT EYE;  Surgeon: Sherrie George, MD;  Location: [facility] OR;  Service: Ophthalmology;  Laterality: Right;  . APPENDECTOMY    . COLONOSCOPY W/ POLYPECTOMY    . GAS INSERTION Right 03/18/2017   Procedure: INSERTION OF GAS RIGHT (C3F8);  Surgeon: Sherrie George, MD;  Location: [facility] OR;  Service: Ophthalmology;  Laterality: Right;  . GAS/FLUID EXCHANGE Right 03/18/2017   Procedure: GAS/FLUID EXCHANGE RIGHT EYE;  Surgeon: Sherrie George, MD;  Location: [facility] OR;  Service: Ophthalmology;  Laterality: Right;  . JOINT REPLACEMENT Right    Hip  . MEMBRANE PEEL Right 03/18/2017   Procedure: MEMBRANE PEEL RIGHT EYE;  Surgeon: Sherrie George, MD;  Location: [facility] OR;  Service: Ophthalmology;  Laterality: Right;  . MOUTH SURGERY    . PHOTOCOAGULATION Left  03/18/2017   Procedure: PHOTOCOAGULATION LEFT EYE;  Surgeon: Sherrie George, MD;  Location: [facility] OR;  Service: Ophthalmology;  Laterality: Left;  . SERUM PATCH Right 03/18/2017   Procedure: SERUM PATCH RIGHT EYE;  Surgeon: Sherrie George, MD;  Location: [facility] OR;  Service: Ophthalmology;  Laterality: Right;    There were no vitals filed for this visit.   Subjective Assessment - 12/12/20 1105    Subjective Patient did not do his daily exercises last night. Went to rock steady and mowed Psychologist, occupational.    Patient is accompained by: Family member    Pertinent History Patient is a 70 year old male who presents for LSVT BIG and LOUD evaluation. Patient recently diagnosed with Parkinson's disease (05/2020) and is Sinemet. He has increased urinary frequency and nocturia as well as low back pain with ambulation. Cogwheeling with his R more than L is noted per neurologist. Was doing [facility] in [place].  Has been using a cane since 2017 and now is on a walker. PMH includes L1 vertebral compression fx, AAA, anemia, essential tremor.    Limitations Lifting;Standing;Walking;House hold activities;Other (comment)    How long can you sit comfortably? R hip pain    How long can you stand comfortably? haven't tried: needs to hold on 2 minutes    How long can you walk comfortably? 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    Patient Stated Goals to be able to walk better    Currently in Pain? No/denies                     Treatment:   Patient seen for LSVT Daily Session Adapted Daily Exercises for facilitation/coordination of movement Sustained movements are designed to rescale the amplitude of movement output for generalization to daily functional activities .Performed as follows for 1 set of 10 repetitions each multidirectional sustained movements  1) Floor to ceiling  , cues to hold for 10 seconds, needs modeling for correct positions , VC to reach out further and to reach up to the ceiling 2) Side to side multidirectional Repetitive movements performed in sitting and are designed to provide retraining effort needed for sustained muscle activation in tasks , cues to lean fwd and hold position x 10 counts: Patient does not move forward to side sitting to forward smoothly and is not able to transition or stretch out leg very far 3) Step and reach forward , cues for good knee flex, cues to move UE and LE together, cues to rotate  arms up to pronate forearms while holding onto chair 4) Step and reach sideways, cues to turn  head sideways, large step sideways and turn head back to neutral, and to rotate arms and pronate forearms while holding onto chair with opposite hand.  5) Step and reach backwards, cues for SUE back, getting toe up and bending back knee while holding onto chair and then swinging arm forward and stomping  6) Rock and reach forward/backward, cues to reach far fwd with UE, patient not comfortable with feet apart , not rocking very far to get a good weight shift, not able to raise  heel or raise  toes much with SUE on chair.  7) Rock and reach sideways, cues for twist and look behind , only rotates sideways, unable to twist around or turn to look behind with one hand on chair   functional component task with supervision 5 reps and simulated activities for: 1. Sit to stand x 5   needs cues for nose over toes and explosive movement.  2. signing name 3.put pant leg on/off 4.tall posture walking 5. Large steps walking:    Specific task training: sign name 5x on paper with standard size pen pull leg onto knee 5x each side to increase flexibility bilaterally tweezer dexterity  peg board L and RUE for coordination and fine motor skills Tie shoelace 5x on hiking boot off in patient lap screwdrive nuts on/off technique for coordination and spatial awareness  with fine motor tasks.   GTB row 10x, trunk extensions 10x for posture    Long ambulation for 460 ft with walking sticks, cueing for large steps and upright posture; one seated rest break. ambulate across stable and unstable surface outside. Negotiating changing surfaces from grass to sidewalk, across brick with turns and obstacles in pathway without LOB.      Pt educated throughout session about proper posture and technique with exercises. Improved exercise technique, movement at target joints, use of target muscles after min to mod verbal, visual, tactile cues.    Patient needs cueing for BIG swing and BIG amplitude.  Patient needs cueing for correct BIG arm swing. Patient improves with practice. Patient needs cuing to consistently swing his arms  and also swing reciprocally. Patient needs cuing to swing UE's reciprocally with weight shift exercise and with backward stepping and correct UE positioning.   Patient's prognosis is guarded due to limited compliance with daily program at home. He is challenged with prolonged ambulation by increased pain in hip rather than fatigue this session. Fine motor skills are improving with familiar task such as bolts and screws but challenged with unfamiliar tasks that require additional concentration.  . Patient will benefit from LSVT program to increase functional mobility, improve ADL performance, and improve quality of life.                  PT Education - 12/12/20 1001    Education Details exercise technique, daily exercises    Person(s) Educated Patient    Methods Explanation;Demonstration;Tactile cues;Verbal cues    Comprehension Verbalized understanding;Returned demonstration;Verbal cues required;Tactile cues required            PT Short Term Goals - 11/27/20 1331      PT SHORT TERM GOAL #1   Title Patient will be independent in home exercise program to improve strength/mobility for better functional independence with ADLs.    Time  2    Period Weeks    Status New    Target Date 12/11/20      PT SHORT TERM GOAL #2   Title Patient will be able to stand 5 x from standard height chair without UE support to increase functional mobility    Baseline 5/2: requires use of BUE support    Time 2    Period Weeks    Status New    Target Date 12/11/20             PT Long Term Goals - 11/27/20 1332      PT LONG TERM GOAL #1   Title Patient will increase FOTO score to equal to or greater than  53%   to demonstrate statistically significant improvement in mobility and quality of life.    Baseline 5/2: 40%    Time 5    Period Weeks    Status New    Target Date 01/01/21      PT LONG TERM GOAL #2   Title Patient (> 48 years old) will complete five times sit to stand test in < 15 seconds without UE support indicating an increased LE strength and improved balance.    Baseline 5/2: unable to perform w/o UE support 15.35 seconds with heavy BUE support    Time 5    Period Weeks    Status New    Target Date 01/01/21      PT LONG TERM GOAL #3   Title Patient will increase Berg Balance score by > 6 points (35/56)  to demonstrate decreased fall risk during functional activities.    Baseline 5/2: 29/56    Time 4    Period Weeks    Status New    Target Date 12/25/20      PT LONG TERM GOAL #4   Title Patient will reduce timed up and go to <11 seconds to reduce fall risk and demonstrate improved transfer/gait ability.    Baseline 5/2: 14.21    Time 5    Period Weeks    Status New    Target Date 01/01/21      PT LONG TERM GOAL #5   Title Patient will be modified independent in walking on even/uneven surface with least restrictive assistive device, for 20+ minutes without rest  break, reporting some difficulty or less to improve walking tolerance with community ambulation including grocery shopping, going to church,etc.    Baseline 5/2: at home can do 10 minutes with breaks    Time 5    Period Weeks    Status New     Target Date 01/01/21                 Plan - 12/12/20 1144    Clinical Impression Statement Patient's prognosis is guarded due to limited compliance with daily program at home. He is challenged with prolonged ambulation by increased pain in hip rather than fatigue this session. Fine motor skills are improving with familiar task such as bolts and screws but challenged with unfamiliar tasks that require additional concentration.  . Patient will benefit from LSVT program to increase functional mobility, improve ADL performance, and improve quality of life.    Personal Factors and Comorbidities Age;Comorbidity 3+;Past/Current Experience;Fitness;Time since onset of injury/illness/exacerbation    Comorbidities L1 vertebral compression fx, AAA, anemia, essential tremor.    Examination-Activity Limitations Bathing;Bend;Caring for Others;Dressing;Hygiene/Grooming;Locomotion Level;Reach Overhead;Sit;Transfers;Stand;Stairs;Squat    Examination-Participation Restrictions Church;Cleaning;Community Activity;Driving;Interpersonal Relationship;Personal Finances;Meal Prep;Medication Management;Laundry;Shop;Volunteer;Yard Work    Conservation officer, historic buildings Evolving/Moderate complexity    Rehab Potential Fair    PT Frequency 4x / week    PT Duration Other (comment)   5 weeks   PT Treatment/Interventions ADLs/Self Care Home Management;Cryotherapy;Electrical Stimulation;Iontophoresis 4mg /ml Dexamethasone;Moist Heat;Traction;DME Instruction;Gait training;Stair training;Functional mobility training;Therapeutic activities;Therapeutic exercise;Balance training;Neuromuscular re-education;Manual techniques;Patient/family education;Passive range of motion;Dry needling;Vestibular;Canalith Repostioning;Taping;Visual/perceptual remediation/compensation;Energy conservation    PT Next Visit Plan LSVT program    PT Home Exercise Plan next session    Consulted and Agree with Plan of Care Patient;Family member/caregiver     Family Member Consulted wife           Patient will benefit from skilled therapeutic intervention in order to improve the following deficits and impairments:  Abnormal gait,Cardiopulmonary status limiting activity,Decreased activity tolerance,Decreased balance,Decreased knowledge of precautions,Decreased endurance,Decreased coordination,Decreased knowledge of use of DME,Decreased mobility,Decreased safety awareness,Difficulty walking,Decreased strength,Impaired flexibility,Impaired perceived functional ability,Impaired tone,Impaired UE functional use,Postural dysfunction,Improper body mechanics,Pain  Visit Diagnosis: Other abnormalities of gait and mobility  Unsteadiness on feet  Unspecified lack of coordination     Problem List Patient Active Problem List   Diagnosis Date Noted  . Macular hole, right eye 03/18/2017   03/20/2017, PT, DPT   12/12/2020, 12:44 PM  [facility] MAIN [facility] SERVICES [address] [place], [place], [place] Phone: [phone]   Fax:  [phone]  Name: Nicolas Solis MRN: [id number] Date of Birth: October 22, 1950

## 2020-12-13 ENCOUNTER — Ambulatory Visit: Payer: Medicare Other | Admitting: Speech Pathology

## 2020-12-13 ENCOUNTER — Ambulatory Visit: Payer: Medicare Other

## 2020-12-13 ENCOUNTER — Other Ambulatory Visit: Payer: Self-pay

## 2020-12-13 DIAGNOSIS — R471 Dysarthria and anarthria: Secondary | ICD-10-CM | POA: Diagnosis not present

## 2020-12-13 DIAGNOSIS — R2689 Other abnormalities of gait and mobility: Secondary | ICD-10-CM

## 2020-12-13 DIAGNOSIS — R2681 Unsteadiness on feet: Secondary | ICD-10-CM

## 2020-12-13 DIAGNOSIS — G20 Parkinson's disease: Secondary | ICD-10-CM

## 2020-12-13 NOTE — Therapy (Signed)
[place] [facility] MAIN [facility] [address] [place], [place], [address] Phone: [phone]   Fax:  [phone]  Physical Therapy Treatment  Patient Details  Name: Nicolas GallusRichard C Solis MRN: [id number] Date of Birth: 06/01/1951 Referring Provider (PT): [name], Hemang   Encounter Date: 12/13/2020   PT End of Session - 12/13/20 1131    Visit Number 8    Number of Visits 17    Date for PT Re-Evaluation 01/01/21    PT Start Time 1101    PT Stop Time 1159    PT Time Calculation (min) 58 min    Equipment Utilized During Treatment Gait belt    Activity Tolerance Patient tolerated treatment well    Behavior During Therapy WFL for tasks assessed/performed           Past Medical History:  Diagnosis Date  . Compression fracture of first cervical vertebra (HCC)   . Essential tremor   . Ileus [facility](HCC)     Past Surgical History:  Procedure Laterality Date  . 25 GAUGE PARS PLANA VITRECTOMY WITH 20 GAUGE MVR PORT Right 03/18/2017  . 25 GAUGE PARS PLANA VITRECTOMY WITH 20 GAUGE MVR PORT FOR MACULAR HOLE Right 03/18/2017   Procedure: 25 GAUGE PARS PLANA VITRECTOMY WITH 20 GAUGE MVR PORT FOR MACULAR HOLE; PHOTOCOAGULATION RIGHT EYE;  Surgeon: [name], [name], MD;  [facility];  Service: Ophthalmology;  Laterality: Right;  . APPENDECTOMY    . COLONOSCOPY W/ POLYPECTOMY    . GAS INSERTION Right 03/18/2017   Procedure: INSERTION OF GAS RIGHT (C3F8);  Surgeon: [name], [name], MD;  [facility];  Service: Ophthalmology;  Laterality: Right;  . GAS/FLUID EXCHANGE Right 03/18/2017   Procedure: GAS/FLUID EXCHANGE RIGHT EYE;  Surgeon: [name], [name], MD;  [facility];  Service: Ophthalmology;  Laterality: Right;  . JOINT REPLACEMENT Right    Hip  . MEMBRANE PEEL Right 03/18/2017   Procedure: MEMBRANE PEEL RIGHT EYE;  Surgeon: [name], [name], MD;  [facility];  Service: Ophthalmology;  Laterality: Right;  . MOUTH SURGERY    . PHOTOCOAGULATION Left  03/18/2017   Procedure: PHOTOCOAGULATION LEFT EYE;  Surgeon: [name], [name], MD;  [facility];  Service: Ophthalmology;  Laterality: Left;  . SERUM PATCH Right 03/18/2017   Procedure: SERUM PATCH RIGHT EYE;  Surgeon: [name], [name], MD;  [facility];  Service: Ophthalmology;  Laterality: Right;    There were no vitals filed for this visit.   Subjective Assessment - 12/13/20 1130    Subjective Patient reports compliance with daily exercises. No falls or LOB since last session    Patient is accompained by: Family member    Pertinent History Patient is a 70 year old male who presents for LSVT BIG and LOUD evaluation. Patient recently diagnosed with Parkinson's disease (05/2020) and is Sinemet. He has increased urinary frequency and nocturia as well as low back pain with ambulation. Cogwheeling with his R more than L is noted per neurologist. [place] [employer] [place]  Has been using a cane since 2017 and now is on a walker. PMH includes L1 vertebral compression fx, AAA, anemia, essential tremor.    Limitations Lifting;Standing;Walking;House hold activities;Other (comment)    How long can you sit comfortably? R hip pain    How long can you stand comfortably? haven't tried: needs to hold on 2 minutes    How long can you walk comfortably? 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    Patient Stated Goals to be able to walk better    Currently in Pain? No/denies                   Treatment:   Patient seen for LSVT Daily Session Adapted Daily Exercises for facilitation/coordination of movement Sustained movements are designed to rescale the amplitude of movement output for generalization to daily functional activities .Performed as follows for 1 set of 10 repetitions each multidirectional sustained movements  1) Floor to ceiling , cues to  hold for 10 seconds, needs modeling for correct positions , VC to reach out further and to reach up to the ceiling 2) Side to side multidirectional Repetitive movements performed in sitting and are designed to provide retraining effort needed for sustained muscle activation in tasks , cues to lean fwd and hold position x 10 counts: Patient does not move forward to side sitting to forward smoothly and is not able to transition or stretch out leg very far 3) Step and reach forward , cues for good knee flex, cues to move UE and LE together, cues to rotate  arms up to pronate forearms while holding onto chair 4) Step and reach sideways, cues to turn  head sideways, large step sideways and turn head back to neutral, and to rotate arms and pronate forearms while holding onto chair with opposite hand.  5) Step and reach backwards, cues for SUE back, getting toe up and bending back knee while holding onto chair and then swinging arm forward and stomping  6) Rock and reach forward/backward, cues to reach far fwd with UE, patient not comfortable with feet apart , not rocking very far to get a good weight shift, not able to raise  heel or raise  toes much with SUE on chair.  7) Rock and reach sideways, cues for twist and look behind , only rotates sideways, unable to twist around or turn to look behind with one hand on chair   functional component task with supervision 5 reps and simulated activities for: 1. Sit to stand x 5   needs cues for nose over toes and explosive movement.  2. signing name 3.put pant leg on/off 4.tall posture walking 5. Large steps walking:    Specific task training: sign name 5x on paper with standard size pen pull leg onto knee 5x each side to increase flexibility bilaterally tweezer dexterity  peg board L and RUE for coordination and fine motor skills Write alphabet with focus on equal letter height and size Tie shoelace 5x on hiking boot off in patient lap Large step up/down 10x  on 18" step BUE support Roller to R IT band 2 minutes     Long ambulation for 460 ft with walking sticks, cueing for large steps and upright posture; one seated rest break. Ambulate negotiating turns, people, with one seated rest break.      Pt educated throughout session about proper posture and technique with exercises. Improved exercise technique, movement at target joints, use of target muscles after min to mod verbal, visual, tactile cues.    Patient needs cueing for BIG swing and BIG amplitude.  Patient needs cueing for correct BIG arm swing. Patient improves with practice. Patient needs cuing to consistently swing his arms and also swing reciprocally. Patient needs cuing to swing UE's reciprocally with weight shift exercise and with  backward stepping and correct UE positioning.   Patient's hip pain limits his ambulatory capacity. He was compliant with daily exercises yesterday and is encouraged to remain compliant to enhance his prognosis. Rolling to IT band reduced pain in hip per patient report. Step ups initiated for carryover to riding horse . Patient will benefit from LSVT program to increase functional mobility, improve ADL performance, and improve quality of life.                    PT Education - 12/13/20 1131    Education Details exercise technique, daily exercises fine and gross motor coordination    Person(s) Educated Patient    Methods Explanation;Demonstration;Tactile cues;Verbal cues    Comprehension Verbalized understanding;Returned demonstration;Verbal cues required;Tactile cues required            PT Short Term Goals - 11/27/20 1331      PT SHORT TERM GOAL #1   Title Patient will be independent in home exercise program to improve strength/mobility for better functional independence with ADLs.    Time 2    Period Weeks    Status New    Target Date 12/11/20      PT SHORT TERM GOAL #2   Title Patient will be able to stand 5 x from standard height  chair without UE support to increase functional mobility    Baseline 5/2: requires use of BUE support    Time 2    Period Weeks    Status New    Target Date 12/11/20             PT Long Term Goals - 11/27/20 1332      PT LONG TERM GOAL #1   Title Patient will increase FOTO score to equal to or greater than  53%   to demonstrate statistically significant improvement in mobility and quality of life.    Baseline 5/2: 40%    Time 5    Period Weeks    Status New    Target Date 01/01/21      PT LONG TERM GOAL #2   Title Patient (> 48 years old) will complete five times sit to stand test in < 15 seconds without UE support indicating an increased LE strength and improved balance.    Baseline 5/2: unable to perform w/o UE support 15.35 seconds with heavy BUE support    Time 5    Period Weeks    Status New    Target Date 01/01/21      PT LONG TERM GOAL #3   Title Patient will increase Berg Balance score by > 6 points (35/56)  to demonstrate decreased fall risk during functional activities.    Baseline 5/2: 29/56    Time 4    Period Weeks    Status New    Target Date 12/25/20      PT LONG TERM GOAL #4   Title Patient will reduce timed up and go to <11 seconds to reduce fall risk and demonstrate improved transfer/gait ability.    Baseline 5/2: 14.21    Time 5    Period Weeks    Status New    Target Date 01/01/21      PT LONG TERM GOAL #5   Title Patient will be modified independent in walking on even/uneven surface with least restrictive assistive device, for 20+ minutes without rest break, reporting some difficulty or less to improve walking tolerance with community ambulation including grocery shopping, going to church,etc.  Baseline 5/2: at home can do 10 minutes with breaks    Time 5    Period Weeks    Status New    Target Date 01/01/21                 Plan - 12/13/20 1435    Clinical Impression Statement Patient's hip pain limits his ambulatory capacity. He  was compliant with daily exercises yesterday and is encouraged to remain compliant to enhance his prognosis. Rolling to IT band reduced pain in hip per patient report. Step ups initiated for carryover to riding horse . Patient will benefit from LSVT program to increase functional mobility, improve ADL performance, and improve quality of life.    Personal Factors and Comorbidities Age;Comorbidity 3+;Past/Current Experience;Fitness;Time since onset of injury/illness/exacerbation    Comorbidities L1 vertebral compression fx, AAA, anemia, essential tremor.    Examination-Activity Limitations Bathing;Bend;Caring for Others;Dressing;Hygiene/Grooming;Locomotion Level;Reach Overhead;Sit;Transfers;Stand;Stairs;Squat    Examination-Participation Restrictions Church;Cleaning;Community Activity;Driving;Interpersonal Relationship;Personal Finances;Meal Prep;Medication Management;Laundry;Shop;Volunteer;Yard Work    Conservation officer, historic buildings Evolving/Moderate complexity    Rehab Potential Fair    PT Frequency 4x / week    PT Duration Other (comment)   5 weeks   PT Treatment/Interventions ADLs/Self Care Home Management;Cryotherapy;Electrical Stimulation;Iontophoresis 4mg /ml Dexamethasone;Moist Heat;Traction;DME Instruction;Gait training;Stair training;Functional mobility training;Therapeutic activities;Therapeutic exercise;Balance training;Neuromuscular re-education;Manual techniques;Patient/family education;Passive range of motion;Dry needling;Vestibular;Canalith Repostioning;Taping;Visual/perceptual remediation/compensation;Energy conservation    PT Next Visit Plan LSVT program    PT Home Exercise Plan next session    Consulted and Agree with Plan of Care Patient;Family member/caregiver    Family Member Consulted wife           Patient will benefit from skilled therapeutic intervention in order to improve the following deficits and impairments:  Abnormal gait,Cardiopulmonary status limiting  activity,Decreased activity tolerance,Decreased balance,Decreased knowledge of precautions,Decreased endurance,Decreased coordination,Decreased knowledge of use of DME,Decreased mobility,Decreased safety awareness,Difficulty walking,Decreased strength,Impaired flexibility,Impaired perceived functional ability,Impaired tone,Impaired UE functional use,Postural dysfunction,Improper body mechanics,Pain  Visit Diagnosis: Parkinson's disease (HCC)  Other abnormalities of gait and mobility  Unsteadiness on feet     Problem List Patient Active Problem List   Diagnosis Date Noted  . Macular hole, right eye 03/18/2017   03/20/2017, PT, DPT   12/13/2020, 2:37 PM  [place] [facility] MAIN [facility] SERVICES [address] [place], [place], [place] Phone: [phone]   Fax:  [phone]  Name: JAMIER URBAS MRN: Nicolas Solis Date of Birth: [birth year]

## 2020-12-13 NOTE — Therapy (Signed)
[place] [facility] MAIN [facility] SERVICES [address] [place], [place], [address] Phone: [phone]   Fax:  [phone]  Speech Language Pathology Treatment  Patient Details  Name: Nicolas Solis MRN: [id number] Date of Birth: Oct 11, 1950 Referring Provider (SLP): Dr. Sherryll Burger   Encounter Date: 12/13/2020   End of Session - 12/13/20 1221    Visit Number 7    Number of Visits 17    Date for SLP Re-Evaluation 02/25/21    Authorization Type MCR    Authorization - Visit Number 7    Progress Note Due on Visit 10    SLP Start Time 1000    SLP Stop Time  1100    SLP Time Calculation (min) 60 min    Activity Tolerance Patient tolerated treatment well           Past Medical History:  Diagnosis Date  . Compression fracture of first cervical vertebra (HCC)   . Essential tremor   . Ileus [facility])     Past Surgical History:  Procedure Laterality Date  . 25 GAUGE PARS PLANA VITRECTOMY WITH 20 GAUGE MVR PORT Right 03/18/2017  . 25 GAUGE PARS PLANA VITRECTOMY WITH 20 GAUGE MVR PORT FOR MACULAR HOLE Right 03/18/2017   Procedure: 25 GAUGE PARS PLANA VITRECTOMY WITH 20 GAUGE MVR PORT FOR MACULAR HOLE; PHOTOCOAGULATION RIGHT EYE;  Surgeon: Sherrie George, MD;  Location: [facility] OR;  Service: Ophthalmology;  Laterality: Right;  . APPENDECTOMY    . COLONOSCOPY W/ POLYPECTOMY    . GAS INSERTION Right 03/18/2017   Procedure: INSERTION OF GAS RIGHT (C3F8);  Surgeon: Sherrie George, MD;  Location: [facility] OR;  Service: Ophthalmology;  Laterality: Right;  . GAS/FLUID EXCHANGE Right 03/18/2017   Procedure: GAS/FLUID EXCHANGE RIGHT EYE;  Surgeon: Sherrie George, MD;  Location: [facility] OR;  Service: Ophthalmology;  Laterality: Right;  . JOINT REPLACEMENT Right    Hip  . MEMBRANE PEEL Right 03/18/2017   Procedure: MEMBRANE PEEL RIGHT EYE;  Surgeon: Sherrie George, MD;  Location[facility] OR;  Service: Ophthalmology;  Laterality: Right;  . MOUTH SURGERY    . PHOTOCOAGULATION Left  03/18/2017   Procedure: PHOTOCOAGULATION LEFT EYE;  Surgeon: Sherrie George, MD;  Location: [facility] OR;  Service: Ophthalmology;  Laterality: Left;  . SERUM PATCH Right 03/18/2017   Procedure: SERUM PATCH RIGHT EYE;  Surgeon: Sherrie George, MD;  Location: [facility] OR;  Service: Ophthalmology;  Laterality: Right;    There were no vitals filed for this visit.   Subjective Assessment - 12/13/20 1217    Subjective "She said 'Good'" (pt's wife re: his voice)    Currently in Pain? No/denies                 ADULT SLP TREATMENT - 12/13/20 1218      General Information   Behavior/Cognition Alert;Cooperative    HPI Nicolas Solis is a 70 y.o. male referred by Dr. Sherryll Burger for LSVT-LOUD therapy due to decreased volume of speech secondary to Parkinson's disease, diagnosed November 2021.      Treatment Provided   Treatment provided Cognitive-Linquistic      Pain Assessment   Pain Assessment No/denies pain      Cognitive-Linquistic Treatment   Treatment focused on Dysarthria;Patient/family/caregiver education    Skilled Treatment LSVT Daily tasks with min cues for pitch, loudness, duration, and mouth opening. Loud /a/ averaged 85 dB, duration 6.3 seconds, pitch avg 200 Hz.  Max F0 for pitch glides was 262 Hz (  88 dB avg), min F0 for low glides was 139 Hz (86 dB avg). Functional phrases averaged 88 dB with modified independence. Patient used effort level 7/10 for paragraph level reading tasks, min cues for loudness, average 85 dB. Carryover to conversation between reading tasks ranging 3-4 minutes average 81 dB with occasional min cues.      Assessment / Recommendations / Plan   Plan Continue with current plan of care      Progression Toward Goals   Progression toward goals Progressing toward goals            SLP Education - 12/13/20 1221    Education Details use loud voice when greeting friend at boxing today    Person(s) Educated Patient    Methods Explanation    Comprehension Verbalized  understanding            SLP Short Term Goals - 11/28/20 1443      SLP SHORT TERM GOAL #1   Title The patient will complete Daily Tasks (Maximum duration "ah", High/Lows, and Functional Phrases) at average loudness >/= 80 dB and with loud, good quality voice with min cues.    Time 10    Period --   sessions   Status New      SLP SHORT TERM GOAL #2   Title The patient will complete Hierarchal Speech Loudness reading drills (words/phrases, sentences) at average >/= 75 dB and with loud, good quality voice with min cues.    Time 10    Period --   sessions   Status New      SLP SHORT TERM GOAL #3   Title The patient will participate in 5-8 minutes conversation, maintaining average loudness of 75 dB and loud, good quality voice with min cues.    Time 10    Period --   sessions   Status New            SLP Long Term Goals - 11/28/20 1444      SLP LONG TERM GOAL #1   Title The patient will complete Daily Tasks (Maximum duration "ah", High/Lows, and Functional Phrases) at average loudness >/= 80 dB and with loud, good quality voice.    Time 4   or 17 sessions, for all LTGs   Period Weeks    Status New    Target Date 01/05/21      SLP LONG TERM GOAL #2   Title The patient will complete Hierarchal Speech Loudness reading drills (words/phrases, sentences, and paragraph) at average >/= 75 dB and with loud, good quality voice.    Time 4    Period Weeks    Status New    Target Date 01/05/21      SLP LONG TERM GOAL #3   Title The patient will participate in 15-20 minutes conversation, maintaining average loudness of 75 dB and loud, good quality voice.    Time 4    Period Weeks    Status New    Target Date 01/05/21      SLP LONG TERM GOAL #4   Title Patient will report improved communication effectiveness as measured by Communicative Effectiveness Survey.    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 Patient continues to improve carryover of loudness in spontaneous responses after hierarchical loudness drills; he was able to maintain for periods of 3-4 minutes of conversation today with min cues. Continue skilled ST to maximize carryover of loudness and vocal quality, improve functional communication and intelligibility for conversations with family, friends and in the community.    Speech Therapy Frequency 4x / week    Duration 4 weeks    Treatment/Interventions Cueing hierarchy;SLP instruction and feedback;Functional tasks;Patient/family education    Potential to Achieve Goals Good    SLP Home Exercise Plan provided, see instruction section    Consulted and Agree with Plan of Care Patient           Patient will benefit from skilled therapeutic intervention in order to improve the following deficits and impairments:   Dysarthria and anarthria  Parkinson's disease [facility])    Problem List Patient Active Problem List   Diagnosis Date Noted  . Macular hole, right eye 03/18/2017   Rondel Baton, MS, CCC-SLP Speech-Language Pathologist  Arlana Lindau 12/13/2020, 12:22 PM  [place] [facility] MAIN [facility] SERVICES [address] [place], [place], [address] Phone: [phone]   Fax:  [phone]   Name: ELDOR CONAWAY MRN: [id number] Date of Birth: December 28, 1950

## 2020-12-14 ENCOUNTER — Ambulatory Visit: Payer: Medicare Other

## 2020-12-14 ENCOUNTER — Ambulatory Visit: Payer: Medicare Other | Admitting: Speech Pathology

## 2020-12-14 DIAGNOSIS — R2681 Unsteadiness on feet: Secondary | ICD-10-CM

## 2020-12-14 DIAGNOSIS — G20 Parkinson's disease: Secondary | ICD-10-CM

## 2020-12-14 DIAGNOSIS — R471 Dysarthria and anarthria: Secondary | ICD-10-CM | POA: Diagnosis not present

## 2020-12-14 DIAGNOSIS — R2689 Other abnormalities of gait and mobility: Secondary | ICD-10-CM

## 2020-12-14 NOTE — Therapy (Signed)
[facility] MAIN [facility] SERVICES [address] [place], [place], [address] Phone: [phone]   Fax:  [phone]  Physical Therapy Treatment  Patient Details  Name: Nicolas Solis MRN: [id number] Date of Birth: 08/26/1950 Referring Provider (PT): Cristopher Peru   Encounter Date: 12/14/2020   PT End of Session - 12/14/20 1101    Visit Number 9    Number of Visits 17    Date for PT Re-Evaluation 01/01/21    PT Start Time 1102    PT Stop Time 1159    PT Time Calculation (min) 57 min    Equipment Utilized During Treatment Gait belt    Activity Tolerance Patient tolerated treatment well    Behavior During Therapy WFL for tasks assessed/performed           Past Medical History:  Diagnosis Date  . Compression fracture of first cervical vertebra (HCC)   . Essential tremor   . Ileus [facility])     Past Surgical History:  Procedure Laterality Date  . 25 GAUGE PARS PLANA VITRECTOMY WITH 20 GAUGE MVR PORT Right 03/18/2017  . 25 GAUGE PARS PLANA VITRECTOMY WITH 20 GAUGE MVR PORT FOR MACULAR HOLE Right 03/18/2017   Procedure: 25 GAUGE PARS PLANA VITRECTOMY WITH 20 GAUGE MVR PORT FOR MACULAR HOLE; PHOTOCOAGULATION RIGHT EYE;  Surgeon: Sherrie George, MD;  Location: [facility] OR;  Service: Ophthalmology;  Laterality: Right;  . APPENDECTOMY    . COLONOSCOPY W/ POLYPECTOMY    . GAS INSERTION Right 03/18/2017   Procedure: INSERTION OF GAS RIGHT (C3F8);  Surgeon: Sherrie George, MD;  Location: [facility] OR;  Service: Ophthalmology;  Laterality: Right;  . GAS/FLUID EXCHANGE Right 03/18/2017   Procedure: GAS/FLUID EXCHANGE RIGHT EYE;  Surgeon: Sherrie George, MD;  Location: [facility] OR;  Service: Ophthalmology;  Laterality: Right;  . JOINT REPLACEMENT Right    Hip  . MEMBRANE PEEL Right 03/18/2017   Procedure: MEMBRANE PEEL RIGHT EYE;  Surgeon: Sherrie George, MD;  Location: [facility] OR;  Service: Ophthalmology;  Laterality: Right;  . MOUTH SURGERY    . PHOTOCOAGULATION Left  03/18/2017   Procedure: PHOTOCOAGULATION LEFT EYE;  Surgeon: Sherrie George, MD;  Location: [facility] OR;  Service: Ophthalmology;  Laterality: Left;  . SERUM PATCH Right 03/18/2017   Procedure: SERUM PATCH RIGHT EYE;  Surgeon: Sherrie George, MD;  Location: [facility] OR;  Service: Ophthalmology;  Laterality: Right;    There were no vitals filed for this visit.   Subjective Assessment - 12/14/20 1118    Subjective Patient reports he did Rock Steady last night but he remembered to do his HEP. No falls or LOB since yesterday.    Patient is accompained by: Family member    Pertinent History Patient is a 70 year old male who presents for LSVT BIG and LOUD evaluation. Patient recently diagnosed with Parkinson's disease (05/2020) and is Sinemet. He has increased urinary frequency and nocturia as well as low back pain with ambulation. Cogwheeling with his R more than L is noted per neurologist. Was doing [employer] in [place].  Has been using a cane since 2017 and now is on a walker. PMH includes L1 vertebral compression fx, AAA, anemia, essential tremor.    Limitations Lifting;Standing;Walking;House hold activities;Other (comment)    How long can you sit comfortably? R hip pain    How long can you stand comfortably? haven't tried: needs to hold on 2 minutes    How long can you  walk comfortably? 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    Patient Stated Goals to be able to walk better    Currently in Pain? Yes    Pain Score 3     Pain Location Hip    Pain Orientation Right    Pain Descriptors / Indicators Aching                    Treatment:   Patient seen for LSVT Daily Session Adapted Daily Exercises for facilitation/coordination of movement Sustained movements are designed to rescale the amplitude of movement output for generalization to daily functional  activities .Performed as follows for 1 set of 10 repetitions each multidirectional sustained movements  1) Floor to ceiling , cues to hold for 10 seconds, needs modeling for correct positions , VC to reach out further and to reach up to the ceiling 2) Side to side multidirectional Repetitive movements performed in sitting and are designed to provide retraining effort needed for sustained muscle activation in tasks , cues to lean fwd and hold position x 10 counts: Patient does not move forward to side sitting to forward smoothly and is not able to transition or stretch out leg very far 3) Step and reach forward , cues for good knee flex, cues to move UE and LE together, cues to rotate  arms up to pronate forearms while holding onto chair 4) Step and reach sideways, cues to turn  head sideways, large step sideways and turn head back to neutral, and to rotate arms and pronate forearms while holding onto chair with opposite hand.  5) Step and reach backwards, cues for SUE back, getting toe up and bending back knee while holding onto chair and then swinging arm forward and stomping  6) Rock and reach forward/backward, cues to reach far fwd with UE, patient not comfortable with feet apart , not rocking very far to get a good weight shift, not able to raise  heel or raise  toes much with SUE on chair.  7) Rock and reach sideways, cues for twist and look behind , only rotates sideways, unable to twist around or turn to look behind with one hand on chair   functional component task with supervision 5 reps and simulated activities for: 1. Sit to stand x 5   needs cues for nose over toes and explosive movement.  2. signing name 3.put pant leg on/off 4.tall posture walking 5. Large steps walking:    Specific task training: sign name 5x on paper with standard size pen pull leg onto knee 5x each side to increase flexibility bilaterally Unlocking medicine cap, opening up and getting three pills and putting back x  5 trials Putting pills back into bottle 20 "pills" (buttons/beads) Write alphabet with focus on equal letter height and size Roller to R IT band 2 minutes     Long ambulation for 460 ft with walking sticks, cueing for large steps and upright posture; one seated rest break. Ambulate negotiating turns, people, with one seated rest break.      Pt educated throughout session about proper posture and technique with exercises. Improved exercise technique, movement at target joints, use of target muscles after min to mod verbal, visual, tactile cues.    Patient needs cueing for BIG swing and BIG amplitude.  Patient needs cueing for correct BIG arm swing. Patient improves with  practice. Patient needs cuing to consistently swing his arms and also swing reciprocally. Patient needs cuing to swing UE's reciprocally with weight shift exercise and with backward stepping and correct UE positioning.    Patient's hip pain continues to be limiting factor however use of the roller reduces pain to a more manageable level. Cues for big steps and upright posture require with patient still relying on external cueing primarily rather than internal cues. Hand coordination with functional movement of opening and getting "pills" aka beads out of container is challenging initially but improves with repetition. Education on spoons and other utensils for Parkinson's provided. Patient will benefit from LSVT program to increase functional mobility, improve ADL performance, and improve quality of life                     PT Education - 12/14/20 1100    Education Details exercise technique, body mechanics    Person(s) Educated Patient    Methods Explanation;Demonstration;Tactile cues;Verbal cues    Comprehension Verbalized understanding;Returned demonstration;Verbal cues required;Tactile cues required            PT Short Term Goals - 11/27/20 1331      PT SHORT TERM GOAL #1   Title Patient will be  independent in home exercise program to improve strength/mobility for better functional independence with ADLs.    Time 2    Period Weeks    Status New    Target Date 12/11/20      PT SHORT TERM GOAL #2   Title Patient will be able to stand 5 x from standard height chair without UE support to increase functional mobility    Baseline 5/2: requires use of BUE support    Time 2    Period Weeks    Status New    Target Date 12/11/20             PT Long Term Goals - 11/27/20 1332      PT LONG TERM GOAL #1   Title Patient will increase FOTO score to equal to or greater than  53%   to demonstrate statistically significant improvement in mobility and quality of life.    Baseline 5/2: 40%    Time 5    Period Weeks    Status New    Target Date 01/01/21      PT LONG TERM GOAL #2   Title Patient (> 71 years old) will complete five times sit to stand test in < 15 seconds without UE support indicating an increased LE strength and improved balance.    Baseline 5/2: unable to perform w/o UE support 15.35 seconds with heavy BUE support    Time 5    Period Weeks    Status New    Target Date 01/01/21      PT LONG TERM GOAL #3   Title Patient will increase Berg Balance score by > 6 points (35/56)  to demonstrate decreased fall risk during functional activities.    Baseline 5/2: 29/56    Time 4    Period Weeks    Status New    Target Date 12/25/20      PT LONG TERM GOAL #4   Title Patient will reduce timed up and go to <11 seconds to reduce fall risk and demonstrate improved transfer/gait ability.    Baseline 5/2: 14.21    Time 5    Period Weeks    Status New    Target Date 01/01/21  PT LONG TERM GOAL #5   Title Patient will be modified independent in walking on even/uneven surface with least restrictive assistive device, for 20+ minutes without rest break, reporting some difficulty or less to improve walking tolerance with community ambulation including grocery shopping, going to  church,etc.    Baseline 5/2: at home can do 10 minutes with breaks    Time 5    Period Weeks    Status New    Target Date 01/01/21                 Plan - 12/14/20 1247    Clinical Impression Statement Patient's hip pain continues to be limiting factor however use of the roller reduces pain to a more manageable level. Cues for big steps and upright posture require with patient still relying on external cueing primarily rather than internal cues. Hand coordination with functional movement of opening and getting "pills" aka beads out of container is challenging initially but improves with repetition. Education on spoons and other utensils for Parkinson's provided. Patient will benefit from LSVT program to increase functional mobility, improve ADL performance, and improve quality of life    Personal Factors and Comorbidities Age;Comorbidity 3+;Past/Current Experience;Fitness;Time since onset of injury/illness/exacerbation    Comorbidities L1 vertebral compression fx, AAA, anemia, essential tremor.    Examination-Activity Limitations Bathing;Bend;Caring for Others;Dressing;Hygiene/Grooming;Locomotion Level;Reach Overhead;Sit;Transfers;Stand;Stairs;Squat    Examination-Participation Restrictions Church;Cleaning;Community Activity;Driving;Interpersonal Relationship;Personal Finances;Meal Prep;Medication Management;Laundry;Shop;Volunteer;Yard Work    Conservation officer, historic buildings Evolving/Moderate complexity    Rehab Potential Fair    PT Frequency 4x / week    PT Duration Other (comment)   5 weeks   PT Treatment/Interventions ADLs/Self Care Home Management;Cryotherapy;Electrical Stimulation;Iontophoresis 4mg /ml Dexamethasone;Moist Heat;Traction;DME Instruction;Gait training;Stair training;Functional mobility training;Therapeutic activities;Therapeutic exercise;Balance training;Neuromuscular re-education;Manual techniques;Patient/family education;Passive range of motion;Dry  needling;Vestibular;Canalith Repostioning;Taping;Visual/perceptual remediation/compensation;Energy conservation    PT Next Visit Plan LSVT program    PT Home Exercise Plan next session    Consulted and Agree with Plan of Care Patient;Family member/caregiver    Family Member Consulted wife           Patient will benefit from skilled therapeutic intervention in order to improve the following deficits and impairments:  Abnormal gait,Cardiopulmonary status limiting activity,Decreased activity tolerance,Decreased balance,Decreased knowledge of precautions,Decreased endurance,Decreased coordination,Decreased knowledge of use of DME,Decreased mobility,Decreased safety awareness,Difficulty walking,Decreased strength,Impaired flexibility,Impaired perceived functional ability,Impaired tone,Impaired UE functional use,Postural dysfunction,Improper body mechanics,Pain  Visit Diagnosis: Parkinson's disease (HCC)  Other abnormalities of gait and mobility  Unsteadiness on feet     Problem List Patient Active Problem List   Diagnosis Date Noted  . Macular hole, right eye 03/18/2017   03/20/2017, PT, DPT   12/14/2020, 12:48 PM  [facility] MAIN [facility] SERVICES [address] [place], [place], [place] Phone: [phone]   Fax:  [phone]  Name: Nicolas Solis MRN: Audrie Gallus Date of Birth: 1951/03/15

## 2020-12-14 NOTE — Therapy (Signed)
[facility] MAIN [facility] SERVICES [address] [place], [place], [address] Phone: [phone]   Fax:  [phone]  Speech Language Pathology Treatment  Patient Details  Name: Nicolas Solis MRN: [id number] Date of Birth: 07-Sep-1950 Referring Provider (SLP): Dr. Sherryll Burger   Encounter Date: 12/14/2020   End of Session - 12/14/20 1251    Visit Number 8    Number of Visits 17    Date for SLP Re-Evaluation 02/25/21    Authorization Type MCR    Authorization - Visit Number 8    Progress Note Due on Visit 10    SLP Start Time 1000    SLP Stop Time  1100    SLP Time Calculation (min) 60 min    Activity Tolerance Patient tolerated treatment well           Past Medical History:  Diagnosis Date  . Compression fracture of first cervical vertebra (HCC)   . Essential tremor   . Ileus [facility])     Past Surgical History:  Procedure Laterality Date  . 25 GAUGE PARS PLANA VITRECTOMY WITH 20 GAUGE MVR PORT Right 03/18/2017  . 25 GAUGE PARS PLANA VITRECTOMY WITH 20 GAUGE MVR PORT FOR MACULAR HOLE Right 03/18/2017   Procedure: 25 GAUGE PARS PLANA VITRECTOMY WITH 20 GAUGE MVR PORT FOR MACULAR HOLE; PHOTOCOAGULATION RIGHT EYE;  Surgeon: Sherrie George, MD;  Location: [facility] OR;  Service: Ophthalmology;  Laterality: Right;  . APPENDECTOMY    . COLONOSCOPY W/ POLYPECTOMY    . GAS INSERTION Right 03/18/2017   Procedure: INSERTION OF GAS RIGHT (C3F8);  Surgeon: Sherrie George, MD;  Location: [facility] OR;  Service: Ophthalmology;  Laterality: Right;  . GAS/FLUID EXCHANGE Right 03/18/2017   Procedure: GAS/FLUID EXCHANGE RIGHT EYE;  Surgeon: Sherrie George, MD;  Location: [facility] OR;  Service: Ophthalmology;  Laterality: Right;  . JOINT REPLACEMENT Right    Hip  . MEMBRANE PEEL Right 03/18/2017   Procedure: MEMBRANE PEEL RIGHT EYE;  Surgeon: Sherrie George, MD;  Location: [facility] OR;  Service: Ophthalmology;  Laterality: Right;  . MOUTH SURGERY    . PHOTOCOAGULATION Left  03/18/2017   Procedure: PHOTOCOAGULATION LEFT EYE;  Surgeon: Sherrie George, MD;  Location: [facility] OR;  Service: Ophthalmology;  Laterality: Left;  . SERUM PATCH Right 03/18/2017   Procedure: SERUM PATCH RIGHT EYE;  Surgeon: Sherrie George, MD;  Location: [facility] OR;  Service: Ophthalmology;  Laterality: Right;    There were no vitals filed for this visit.   Subjective Assessment - 12/14/20 1246    Subjective Practiced yesterday, reported he used loud voice at boxing.    Currently in Pain? Yes    Pain Score 3     Pain Location Hip                 ADULT SLP TREATMENT - 12/14/20 1247      General Information   Behavior/Cognition Alert;Cooperative    HPI Nicolas Solis is a 70 y.o. male referred by Dr. Sherryll Burger for LSVT-LOUD therapy due to decreased volume of speech secondary to Parkinson's disease, diagnosed November 2021.      Treatment Provided   Treatment provided Cognitive-Linquistic      Cognitive-Linquistic Treatment   Treatment focused on Dysarthria;Patient/family/caregiver education    Skilled Treatment LSVT Daily tasks with min cues for pitch, loudness, duration, and mouth opening. Loud /a/ averaged 85 dB, duration 6.4 seconds. Max F0 for pitch glides was 257 Hz (87 dB avg), min  F0 for low glides was 132 Hz (85 dB avg). Functional phrases averaged 88 dB with modified independence. Patient used effort level 7/10 for multi- paragraph level reading tasks, average 84 dB, with increased need for cuing after 20 min due to fatigue . Carryover to conversation between reading tasks ranging 3-4 minutes average 81 dB with occasional min cues.      Assessment / Recommendations / Plan   Plan Continue with current plan of care      Progression Toward Goals   Progression toward goals Progressing toward goals            SLP Education - 12/14/20 1250    Education Details loud conversation with wife every day    Person(s) Educated Patient    Methods Explanation    Comprehension  Verbalized understanding            SLP Short Term Goals - 11/28/20 1443      SLP SHORT TERM GOAL #1   Title The patient will complete Daily Tasks (Maximum duration "ah", High/Lows, and Functional Phrases) at average loudness >/= 80 dB and with loud, good quality voice with min cues.    Time 10    Period --   sessions   Status New      SLP SHORT TERM GOAL #2   Title The patient will complete Hierarchal Speech Loudness reading drills (words/phrases, sentences) at average >/= 75 dB and with loud, good quality voice with min cues.    Time 10    Period --   sessions   Status New      SLP SHORT TERM GOAL #3   Title The patient will participate in 5-8 minutes conversation, maintaining average loudness of 75 dB and loud, good quality voice with min cues.    Time 10    Period --   sessions   Status New            SLP Long Term Goals - 11/28/20 1444      SLP LONG TERM GOAL #1   Title The patient will complete Daily Tasks (Maximum duration "ah", High/Lows, and Functional Phrases) at average loudness >/= 80 dB and with loud, good quality voice.    Time 4   or 17 sessions, for all LTGs   Period Weeks    Status New    Target Date 01/05/21      SLP LONG TERM GOAL #2   Title The patient will complete Hierarchal Speech Loudness reading drills (words/phrases, sentences, and paragraph) at average >/= 75 dB and with loud, good quality voice.    Time 4    Period Weeks    Status New    Target Date 01/05/21      SLP LONG TERM GOAL #3   Title The patient will participate in 15-20 minutes conversation, maintaining average loudness of 75 dB and loud, good quality voice.    Time 4    Period Weeks    Status New    Target Date 01/05/21      SLP LONG TERM GOAL #4   Title Patient will report improved communication effectiveness as measured by Communicative Effectiveness Survey.    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 Patient continues to improve carryover of loudness in spontaneous responses after hierarchical loudness drills; he was able to maintain for periods of 3-4 minutes of conversation today with min cues. Continue skilled ST to maximize carryover of loudness and vocal quality, improve functional communication and intelligibility for conversations with family, friends and in the community.    Speech Therapy Frequency 4x / week    Duration 4 weeks    Treatment/Interventions Cueing hierarchy;SLP instruction and feedback;Functional tasks;Patient/family education    Potential to Achieve Goals Good    SLP Home Exercise Plan provided, see instruction section    Consulted and Agree with Plan of Care Patient           Patient will benefit from skilled therapeutic intervention in order to improve the following deficits and impairments:   Dysarthria and anarthria  Parkinson's disease [facility])    Problem List Patient Active Problem List   Diagnosis Date Noted  . Macular hole, right eye 03/18/2017   Rondel Baton, MS, CCC-SLP Speech-Language Pathologist  Arlana Lindau 12/14/2020, 12:52 PM  [facility] MAIN [facility] SERVICES [address] [place], [place], [address] Phone: [phone]   Fax:  [phone]   Name: BLANCA THORNTON MRN: [id number] Date of Birth: 01/15/1951

## 2020-12-18 ENCOUNTER — Other Ambulatory Visit: Payer: Self-pay

## 2020-12-18 ENCOUNTER — Ambulatory Visit: Payer: Medicare Other

## 2020-12-18 ENCOUNTER — Ambulatory Visit: Payer: Medicare Other | Admitting: Speech Pathology

## 2020-12-18 DIAGNOSIS — R471 Dysarthria and anarthria: Secondary | ICD-10-CM | POA: Diagnosis not present

## 2020-12-18 DIAGNOSIS — G20 Parkinson's disease: Secondary | ICD-10-CM

## 2020-12-18 DIAGNOSIS — R2689 Other abnormalities of gait and mobility: Secondary | ICD-10-CM

## 2020-12-18 DIAGNOSIS — R2681 Unsteadiness on feet: Secondary | ICD-10-CM

## 2020-12-18 NOTE — Therapy (Signed)
[facility] MAIN [facility] SERVICES [address] [place], [place], [address] Phone: [phone]   Fax:  [phone]  Speech Language Pathology Treatment  Patient Details  Name: Nicolas Solis MRN: [id number] Date of Birth: 1950/10/01 Referring Provider (SLP): Dr. Sherryll Burger   Encounter Date: 12/18/2020   End of Session - 12/18/20 1609    Visit Number 9    Number of Visits 17    Date for SLP Re-Evaluation 02/25/21    Authorization Type MCR    Authorization - Visit Number 9    Progress Note Due on Visit 10    SLP Start Time 1000    SLP Stop Time  1100    SLP Time Calculation (min) 60 min    Activity Tolerance Patient tolerated treatment well           Past Medical History:  Diagnosis Date  . Compression fracture of first cervical vertebra (HCC)   . Essential tremor   . Ileus [facility])     Past Surgical History:  Procedure Laterality Date  . 25 GAUGE PARS PLANA VITRECTOMY WITH 20 GAUGE MVR PORT Right 03/18/2017  . 25 GAUGE PARS PLANA VITRECTOMY WITH 20 GAUGE MVR PORT FOR MACULAR HOLE Right 03/18/2017   Procedure: 25 GAUGE PARS PLANA VITRECTOMY WITH 20 GAUGE MVR PORT FOR MACULAR HOLE; PHOTOCOAGULATION RIGHT EYE;  Surgeon: Sherrie George, MD;  Location: [facility] OR;  Service: Ophthalmology;  Laterality: Right;  . APPENDECTOMY    . COLONOSCOPY W/ POLYPECTOMY    . GAS INSERTION Right 03/18/2017   Procedure: INSERTION OF GAS RIGHT (C3F8);  Surgeon: Sherrie George, MD;  Location: [facility] OR;  Service: Ophthalmology;  Laterality: Right;  . GAS/FLUID EXCHANGE Right 03/18/2017   Procedure: GAS/FLUID EXCHANGE RIGHT EYE;  Surgeon: Sherrie George, MD;  Location: [facility] OR;  Service: Ophthalmology;  Laterality: Right;  . JOINT REPLACEMENT Right    Hip  . MEMBRANE PEEL Right 03/18/2017   Procedure: MEMBRANE PEEL RIGHT EYE;  Surgeon: Sherrie George, MD;  Location: [facility] OR;  Service: Ophthalmology;  Laterality: Right;  . MOUTH SURGERY    . PHOTOCOAGULATION Left  03/18/2017   Procedure: PHOTOCOAGULATION LEFT EYE;  Surgeon: Sherrie George, MD;  Location[facility] OR;  Service: Ophthalmology;  Laterality: Left;  . SERUM PATCH Right 03/18/2017   Procedure: SERUM PATCH RIGHT EYE;  Surgeon: Sherrie George, MD;  Location: [facility] OR;  Service: Ophthalmology;  Laterality: Right;    There were no vitals filed for this visit.   Subjective Assessment - 12/18/20 1615    Subjective Patient reports he practiced over the weekend.    Currently in Pain? Yes    Pain Score 3     Pain Location Hip                 ADULT SLP TREATMENT - 12/18/20 1314      General Information   Behavior/Cognition Alert;Cooperative    HPI Nicolas Solis is a 70 y.o. male referred by Dr. Sherryll Burger for LSVT-LOUD therapy due to decreased volume of speech secondary to Parkinson's disease, diagnosed November 2021.      Treatment Provided   Treatment provided Cognitive-Linquistic      Cognitive-Linquistic Treatment   Treatment focused on Dysarthria;Patient/family/caregiver education    Skilled Treatment LSVT Daily tasks with minimal to moderate cues for loudness and duration. Minimal cues for pitch. Loud /a/ averaged 85 dB, duration 6.6 seconds. Max F0 for pitch glides was 291 Hz (87 dB avg),  min F0 for low glides was 137 Hz (84 dB avg). Functional phrases averaged 88 dB with modified independence. Patient used effort level 7/10 for functional phrases and multi-paragraph reading tasks, average 84 dB, with minimal cues. Carryover to conversation between reading tasks ranging 15-20 minutes average 79 dB with occasional min cues due to fatigue.      Assessment / Recommendations / Plan   Plan Continue with current plan of care      Progression Toward Goals   Progression toward goals Progressing toward goals              SLP Short Term Goals - 11/28/20 1443      SLP SHORT TERM GOAL #1   Title The patient will complete Daily Tasks (Maximum duration "ah", High/Lows, and Functional  Phrases) at average loudness >/= 80 dB and with loud, good quality voice with min cues.    Time 10    Period --   sessions   Status New      SLP SHORT TERM GOAL #2   Title The patient will complete Hierarchal Speech Loudness reading drills (words/phrases, sentences) at average >/= 75 dB and with loud, good quality voice with min cues.    Time 10    Period --   sessions   Status New      SLP SHORT TERM GOAL #3   Title The patient will participate in 5-8 minutes conversation, maintaining average loudness of 75 dB and loud, good quality voice with min cues.    Time 10    Period --   sessions   Status New            SLP Long Term Goals - 11/28/20 1444      SLP LONG TERM GOAL #1   Title The patient will complete Daily Tasks (Maximum duration "ah", High/Lows, and Functional Phrases) at average loudness >/= 80 dB and with loud, good quality voice.    Time 4   or 17 sessions, for all LTGs   Period Weeks    Status New    Target Date 01/05/21      SLP LONG TERM GOAL #2   Title The patient will complete Hierarchal Speech Loudness reading drills (words/phrases, sentences, and paragraph) at average >/= 75 dB and with loud, good quality voice.    Time 4    Period Weeks    Status New    Target Date 01/05/21      SLP LONG TERM GOAL #3   Title The patient will participate in 15-20 minutes conversation, maintaining average loudness of 75 dB and loud, good quality voice.    Time 4    Period Weeks    Status New    Target Date 01/05/21      SLP LONG TERM GOAL #4   Title Patient will report improved communication effectiveness as measured by Communicative Effectiveness Survey.    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 Patient continues to improve carryover of loudness in spontaneous responses after hierarchical loudness drills; he was able to maintain for periods of 15-20 minutes of conversation today with min  cues. Continue skilled ST to maximize carryover of loudness and vocal quality, improve functional communication and intelligibility for conversations with family, friends and in the community.    Speech Therapy Frequency 4x / week    Duration 4 weeks    Treatment/Interventions Cueing hierarchy;SLP instruction and feedback;Functional tasks;Patient/family education    Potential to Achieve Goals Good    SLP Home Exercise Plan provided, see instruction section    Consulted and Agree with Plan of Care Patient           Patient will benefit from skilled therapeutic intervention in order to improve the following deficits and impairments:   Dysarthria and anarthria  Parkinson's disease [facility])    Problem List Patient Active Problem List   Diagnosis Date Noted  . Macular hole, right eye 03/18/2017   Rondel Baton, MS, CCC-SLP Speech-Language Pathologist  Arlana Lindau 12/18/2020, 4:17 PM  [facility] MAIN [facility] SERVICES [address] [place], [place], [address] Phone: [phone]   Fax:  [phone]   Name: KIETH HARTIS MRN: [id number] Date of Birth: [birth year]

## 2020-12-18 NOTE — Therapy (Signed)
[facility] [address] [place], [place], [address] Phone: [phone]   Fax:  [phone]  Physical Therapy Treatment Physical Therapy Progress Note   Dates of reporting period  11/27/20   to   12/18/20  Patient Details  Name: Nicolas Solis MRN: [id number] Date of Birth: [birth year] Referring Provider (PT): Jennings Books   Encounter Date: 12/18/2020   PT End of Session - 12/18/20 1151    Visit Number 10    Number of Visits 17    Date for PT Re-Evaluation 01/01/21    Authorization Type next session 1/10 PN 5/23    PT Start Time 1100    PT Stop Time 1159    PT Time Calculation (min) 59 min    Equipment Utilized During Treatment Gait belt    Activity Tolerance Patient tolerated treatment well    Behavior During Therapy WFL for tasks assessed/performed           Past Medical History:  Diagnosis Date  . Compression fracture of first cervical vertebra (HCC)   . Essential tremor   . Ileus [facility])     Past Surgical History:  Procedure Laterality Date  . [place] VITRECTOMY WITH 20 GAUGE MVR PORT Right 03/18/2017  . 25 GAUGE PARS PLANA VITRECTOMY WITH 20 GAUGE MVR PORT FOR MACULAR HOLE Right 03/18/2017   Procedure: 25 GAUGE PARS PLANA VITRECTOMY WITH 20 GAUGE MVR PORT FOR MACULAR HOLE; PHOTOCOAGULATION RIGHT EYE;  Surgeon: Hayden Pedro, MD;  Location: [place];  Service: Ophthalmology;  Laterality: Right;  . APPENDECTOMY    . COLONOSCOPY W/ POLYPECTOMY    . GAS INSERTION Right 03/18/2017   Procedure: INSERTION OF GAS RIGHT (C3F8);  Surgeon: Hayden Pedro, MD;  Location: [place];  Service: Ophthalmology;  Laterality: Right;  . GAS/FLUID EXCHANGE Right 03/18/2017   Procedure: GAS/FLUID EXCHANGE RIGHT EYE;  Surgeon: Hayden Pedro, MD;  Location: [place];  Service: Ophthalmology;  Laterality: Right;  . JOINT REPLACEMENT Right    Hip  . MEMBRANE PEEL Right 03/18/2017   Procedure: MEMBRANE PEEL RIGHT EYE;   Surgeon: Hayden Pedro, MD;  Location: [place];  Service: Ophthalmology;  Laterality: Right;  . MOUTH SURGERY    . PHOTOCOAGULATION Left 03/18/2017   Procedure: PHOTOCOAGULATION LEFT EYE;  Surgeon: Hayden Pedro, MD;  Location: [place];  Service: Ophthalmology;  Laterality: Left;  . SERUM PATCH Right 03/18/2017   Procedure: SERUM PATCH RIGHT EYE;  Surgeon: Hayden Pedro, MD;  Location: [place];  Service: Ophthalmology;  Laterality: Right;    There were no vitals filed for this visit.   Subjective Assessment - 12/18/20 1113    Subjective Patient has been compliant with HEP and daily exercises over the weekend. No falls or LOB, has rock steady tonight    Patient is accompained by: Family member    Pertinent History Patient is a 70 year old male who presents for LSVT BIG and LOUD evaluation. Patient recently diagnosed with Parkinson's disease (05/2020) and is Sinemet. He has increased urinary frequency and nocturia as well as low back pain with ambulation. Cogwheeling with his R more than L is noted per neurologist. Was doing [employer] in [place].  Has been using a cane since 2017 and now is on a walker. PMH includes L1 vertebral compression fx, AAA, anemia, essential tremor.    Limitations Lifting;Standing;Walking;House hold activities;Other (comment)    How long can you sit comfortably?  R hip pain    How long can you stand comfortably? haven't tried: needs to hold on 2 minutes    How long can you walk comfortably? 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    Patient Stated Goals to be able to walk better    Currently in Pain? Yes    Pain Score 3     Pain Location Hip    Pain Orientation Right    Pain Descriptors / Indicators Aching    Pain Type Chronic pain    Pain Onset More than a month ago    Pain Frequency Intermittent              [facility] PT  Assessment - 12/18/20 0001      Standardized Balance Assessment   Standardized Balance Assessment Berg Balance Test      Berg Balance Test   Sit to Stand Able to stand without using hands and stabilize independently    Standing Unsupported Able to stand safely 2 minutes    Sitting with Back Unsupported but Feet Supported on Floor or Stool Able to sit safely and securely 2 minutes    Stand to Sit Sits safely with minimal use of hands    Transfers Able to transfer safely, minor use of hands    Standing Unsupported with Eyes Closed Able to stand 10 seconds with supervision    Standing Unsupported with Feet Together Able to place feet together independently and stand for 1 minute with supervision    From Standing, Reach Forward with Outstretched Arm Can reach forward >5 cm safely (2")    From Standing Position, Pick up Object from [place] to pick up shoe, needs supervision    From Standing Position, Turn to Look Behind Over each Shoulder Turn sideways only but maintains balance    Turn 360 Degrees Able to turn 360 degrees safely but slowly    Standing Unsupported, Alternately Place Feet on Step/Stool Able to complete >2 steps/needs minimal assist    Standing Unsupported, One Foot in Front Able to take small step independently and hold 30 seconds    Standing on One Leg Tries to lift leg/unable to hold 3 seconds but remains standing independently    Total Score 39               Progress note Goals:  5x STS from standard height chair : 11.44 seconds no UE support FOTO: 50%  BERG: 39/ 56  TUG: 14.01 seconds no AD  Walk 20 minutes: limited by pain:     Treatment:   Patient seen for LSVT Daily Session Adapted Daily Exercises for facilitation/coordination of movement Sustained movements are designed to rescale the amplitude of movement output for generalization to daily functional activities .Performed as follows for 1 set of 10 repetitions each multidirectional sustained movements  1)  Floor to ceiling , cues to hold for 10 seconds, needs modeling for correct positions , VC to reach out further and to reach up to the ceiling 2) Side to side multidirectional Repetitive movements performed in sitting and are designed to provide retraining effort needed for sustained muscle activation in tasks , cues to lean fwd and hold position x 10 counts: Patient does not move forward to side sitting to forward smoothly and is not able to transition or stretch out leg very far 3) Step and reach forward ,  cues for good knee flex, cues to move UE and LE together, cues to rotate  arms up to pronate forearms while holding onto chair 4) Step and reach sideways, cues to turn  head sideways, large step sideways and turn head back to neutral, and to rotate arms and pronate forearms while holding onto chair with opposite hand.  5) Step and reach backwards, cues for SUE back, getting toe up and bending back knee while holding onto chair and then swinging arm forward and stomping  6) Rock and reach forward/backward, cues to reach far fwd with UE, patient not comfortable with feet apart , not rocking very far to get a good weight shift, not able to raise  heel or raise  toes much with SUE on chair.  7) Rock and reach sideways, cues for twist and look behind , only rotates sideways, unable to twist around or turn to look behind with one hand on chair   functional component task with supervision 5 reps and simulated activities for: 1. Sit to stand x 5   needs cues for nose over toes and explosive movement.  2. signing name 3.put pant leg on/off 4.tall posture walking 5. Large steps walking:    Specific task training:  sign name 5x on paper with standard size pen pull leg onto knee 5x each side to increase flexibility bilaterally Grooved peg board x each UE Roller to R IT band 2 minutes     Long ambulation for 460 ft with walking sticks, cueing for large steps and upright posture; one seated rest break.  Ambulate negotiating turns, people, with one seated rest break.      Pt educated throughout session about proper posture and technique with exercises. Improved exercise technique, movement at target joints, use of target muscles after min to mod verbal, visual, tactile cues.     Patient's condition has the potential to improve in response to therapy. Maximum improvement is yet to be obtained. The anticipated improvement is attainable and reasonable in a generally predictable time.  Patient reports that he is not yet where he wants to be.      Patient is making excellent progression towards goals at this time with 10 point increase in BERG, ability to perform STS w/o UE support and ambulate without an AD for short distances. Long duration ambulation is limited by pain in R hip rather than by fatigue. Patient's condition has the potential to improve in response to therapy. Maximum improvement is yet to be obtained. The anticipated improvement is attainable and reasonable in a generally predictable time.  Parkinson's provided. Patient will benefit from LSVT program to increase functional mobility, improve ADL performance, and improve quality of life             PT Education - 12/18/20 1150    Education Details goals, progress note, daily tasks    Person(s) Educated Patient    Methods Explanation;Demonstration;Tactile cues;Verbal cues    Comprehension Verbalized understanding;Returned demonstration;Verbal cues required;Tactile cues required            PT Short Term Goals - 12/18/20 1152      PT SHORT TERM GOAL #1   Title Patient will be independent in home exercise program to improve strength/mobility for better functional independence with ADLs.    Baseline 5/23: intmittent compliance    Time 2    Period Weeks    Status Partially Met    Target Date 12/11/20      PT SHORT TERM GOAL #  2   Title Patient will be able to stand 5 x from standard height chair without UE support to  increase functional mobility    Baseline 5/2: requires use of BUE support 5/23: able to perform    Time 2    Period Weeks    Status Achieved    Target Date 12/11/20             PT Long Term Goals - 12/18/20 1152      PT LONG TERM GOAL #1   Title Patient will increase FOTO score to equal to or greater than  53%   to demonstrate statistically significant improvement in mobility and quality of life.    Baseline 5/2: 40% 5/23: 50%    Time 5    Period Weeks    Status Partially Met    Target Date 01/01/21      PT LONG TERM GOAL #2   Title Patient (> 31 years old) will complete five times sit to stand test in < 15 seconds without UE support indicating an increased LE strength and improved balance.    Baseline 5/2: unable to perform w/o UE support 15.35 seconds with heavy BUE support 5/23: 11.44 seconds w/o UE support    Time 5    Period Weeks    Status Partially Met    Target Date 01/01/21      PT LONG TERM GOAL #3   Title Patient will increase Berg Balance score by > 6 points (35/56)  to demonstrate decreased fall risk during functional activities.    Baseline 5/2: 29/56 5/23: 39/56    Time 4    Period Weeks    Status Achieved      PT LONG TERM GOAL #4   Title Patient will reduce timed up and go to <11 seconds to reduce fall risk and demonstrate improved transfer/gait ability.    Baseline 5/2: 14.21 w AD 5/23: 14.01 seconds w/o AD    Time 5    Period Weeks    Status Partially Met    Target Date 01/01/21      PT LONG TERM GOAL #5   Title Patient will be modified independent in walking on even/uneven surface with least restrictive assistive device, for 20+ minutes without rest break, reporting some difficulty or less to improve walking tolerance with community ambulation including grocery shopping, going to church,etc.    Baseline 5/2: at home can do 10 minutes with breaks 5/23: limited by pain in hip    Time 5    Period Weeks    Status On-going    Target Date 01/01/21                  Plan - 12/18/20 1156    Clinical Impression Statement Patient is making excellent progression towards goals at this time with 10 point increase in BERG, ability to perform STS w/o UE support and ambulate without an AD for short distances. Long duration ambulation is limited by pain in R hip rather than by fatigue. Patient's condition has the potential to improve in response to therapy. Maximum improvement is yet to be obtained. The anticipated improvement is attainable and reasonable in a generally predictable time.  Parkinson's provided. Patient will benefit from LSVT program to increase functional mobility, improve ADL performance, and improve quality of life    Personal Factors and Comorbidities Age;Comorbidity 3+;Past/Current Experience;Fitness;Time since onset of injury/illness/exacerbation    Comorbidities L1 vertebral compression fx, AAA, anemia, essential tremor.  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 Evolving/Moderate complexity    Rehab Potential Fair    PT Frequency 4x / week    PT Duration Other (comment)   5 weeks   PT Treatment/Interventions ADLs/Self Care Home Management;Cryotherapy;Electrical Stimulation;Iontophoresis 37m/ml Dexamethasone;Moist Heat;Traction;DME Instruction;Gait training;Stair training;Functional mobility training;Therapeutic activities;Therapeutic exercise;Balance training;Neuromuscular re-education;Manual techniques;Patient/family education;Passive range of motion;Dry needling;Vestibular;Canalith Repostioning;Taping;Visual/perceptual remediation/compensation;Energy conservation    PT Next Visit Plan  LSVT program    PT Home Exercise Plan next session    Consulted and Agree with Plan of Care Patient;Family member/caregiver    Family Member Consulted wife           Patient will benefit from skilled therapeutic intervention in order to improve the following deficits and impairments:  Abnormal gait,Cardiopulmonary status limiting activity,Decreased activity tolerance,Decreased balance,Decreased knowledge of precautions,Decreased endurance,Decreased coordination,Decreased knowledge of use of DME,Decreased mobility,Decreased safety awareness,Difficulty walking,Decreased strength,Impaired flexibility,Impaired perceived functional ability,Impaired tone,Impaired UE functional use,Postural dysfunction,Improper body mechanics,Pain  Visit Diagnosis: Parkinson's disease ([place]  Other abnormalities of gait and mobility  Unsteadiness on feet     Problem List Patient Active Problem List   Diagnosis Date Noted  . Macular hole, right eye 03/18/2017   MJanna Arch PT, DPT   12/18/2020, 12:03 PM  [facility] [address] [address]Phone: [phone]  Fax:  [phone] Name: RKAIO KUHLMANMRN: [id number] of Birth: [birth year]

## 2020-12-19 ENCOUNTER — Ambulatory Visit: Payer: Medicare Other

## 2020-12-19 ENCOUNTER — Ambulatory Visit: Payer: Medicare Other | Admitting: Speech Pathology

## 2020-12-19 DIAGNOSIS — R471 Dysarthria and anarthria: Secondary | ICD-10-CM | POA: Diagnosis not present

## 2020-12-19 DIAGNOSIS — G20 Parkinson's disease: Secondary | ICD-10-CM

## 2020-12-19 DIAGNOSIS — R2681 Unsteadiness on feet: Secondary | ICD-10-CM

## 2020-12-19 DIAGNOSIS — R2689 Other abnormalities of gait and mobility: Secondary | ICD-10-CM

## 2020-12-19 NOTE — Therapy (Signed)
[place] MAIN [facility] SERVICES [address] [place], [place], [address] Phone: [phone]   Fax:  [phone]  Physical Therapy Treatment  Patient Details  Name: Nicolas Solis MRN: [id number] Date of Birth: 1951-03-29 Referring Provider (PT): Jennings Books   Encounter Date: 12/19/2020   PT End of Session - 12/19/20 1226    Visit Number 11    Number of Visits 17    Date for PT Re-Evaluation 01/01/21    Authorization Type 1/10 PN 5/23    PT Start Time 1101    PT Stop Time 1159    PT Time Calculation (min) 58 min    Equipment Utilized During Treatment Gait belt    Activity Tolerance Patient tolerated treatment well    Behavior During Therapy WFL for tasks assessed/performed           Past Medical History:  Diagnosis Date  . Compression fracture of first cervical vertebra (HCC)   . Essential tremor   . Ileus [facility])     Past Surgical History:  Procedure Laterality Date  . [place] VITRECTOMY WITH 20 GAUGE MVR PORT Right 03/18/2017  . 25 GAUGE PARS [place] VITRECTOMY WITH 20 GAUGE MVR PORT FOR MACULAR HOLE Right 03/18/2017   Procedure: 25 GAUGE PARS PLANA VITRECTOMY WITH 20 GAUGE MVR PORT FOR MACULAR HOLE; PHOTOCOAGULATION RIGHT EYE;  Surgeon: Hayden Pedro, MD;  Location: [place];  Service: Ophthalmology;  Laterality: Right;  . APPENDECTOMY    . COLONOSCOPY W/ POLYPECTOMY    . GAS INSERTION Right 03/18/2017   Procedure: INSERTION OF GAS RIGHT (C3F8);  Surgeon: Hayden Pedro, MD;  Location: [place];  Service: Ophthalmology;  Laterality: Right;  . GAS/FLUID EXCHANGE Right 03/18/2017   Procedure: GAS/FLUID EXCHANGE RIGHT EYE;  Surgeon: Hayden Pedro, MD;  Location: [place];  Service: Ophthalmology;  Laterality: Right;  . JOINT REPLACEMENT Right    Hip  . MEMBRANE PEEL Right 03/18/2017   Procedure: MEMBRANE PEEL RIGHT EYE;  Surgeon: Hayden Pedro, MD;  Location: [place];  Service: Ophthalmology;  Laterality: Right;  . MOUTH  SURGERY    . PHOTOCOAGULATION Left 03/18/2017   Procedure: PHOTOCOAGULATION LEFT EYE;  Surgeon: Hayden Pedro, MD;  Location: [place];  Service: Ophthalmology;  Laterality: Left;  . SERUM PATCH Right 03/18/2017   Procedure: SERUM PATCH RIGHT EYE;  Surgeon: Hayden Pedro, MD;  Location: [place];  Service: Ophthalmology;  Laterality: Right;    There were no vitals filed for this visit.   Subjective Assessment - 12/19/20 1224    Subjective Patient reports he didn't do his daily exercises yesterday because he went to [facility].    Patient is accompained by: Family member    Pertinent History Patient is a 70 year old male who presents for LSVT BIG and LOUD evaluation. Patient recently diagnosed with Parkinson's disease (05/2020) and is Sinemet. He has increased urinary frequency and nocturia as well as low back pain with ambulation. Cogwheeling with his R more than L is noted per neurologist. Was doing [employer] in [place].  Has been using a cane since 2017 and now is on a walker. PMH includes L1 vertebral compression fx, AAA, anemia, essential tremor.    Limitations Lifting;Standing;Walking;House hold activities;Other (comment)    How long can you sit comfortably? R hip pain    How long can you stand comfortably? haven't tried: needs to hold on 2 minutes    How long  can you walk comfortably? 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    Patient Stated Goals to be able to walk better    Currently in Pain? Yes    Pain Score 3     Pain Location Hip    Pain Orientation Right    Pain Descriptors / Indicators Aching    Pain Type Chronic pain    Pain Onset More than a month ago    Pain Frequency Intermittent                    Treatment: Patient seen for LSVT Daily Session Adapted Daily Exercises for facilitation/coordination of movement  Sustained movements are designed to rescale the amplitude of movement output for generalization to daily functional activities .Performed as follows for 1 set of 10 repetitions each multidirectional sustained movements  1) Floor to ceiling , cues to hold for 10 seconds, needs modeling for correct positions, VC to reach out furtherand to reach up to the ceiling 2) Side to side multidirectional Repetitive movements performed in sittingand are designed to provide retraining effort needed for sustained muscle activation in tasks, cues to lean fwd and hold position x 10 counts: Patient does not move forward to side sitting to forward smoothly and is not able to transition or stretch out leg very far 3) Step and reach forward , cues for good knee flex, cues to move UE and LE together, cues to rotate arms up to pronate forearms while holding onto chair 4) Step and reach sideways, cues to turn head sideways, large step sideways and turn head back to neutral, and to rotate arms and pronate forearms while holding onto chair with opposite hand.  5) Step and reach backwards, cues for SUE back, getting toe up and bending back knee while holding onto chair and then swinging arm forward and stomping  6) Rock and reach forward/backward, cues to reach far fwd with UE, patient not comfortable with feet apart, not rocking very far to get a good weight shift, not able to raise heel or raise toes much with SUE on chair.  7) Rock and reach sideways, cues for twist and look behind, only rotates sideways, unable to twist around or turn to look behind with one hand on chair  functional component task with supervision 5 reps and simulated activities for: 1. Sit to standx 5needs cues for nose over toes and explosive movement.  2.signing name 3.put pant leg on/off 4.tall posture walking 5.Large steps walking:   Specific task training: sign name 5x on paper with standard size pen pull leg onto knee 5x each  side to increase flexibility bilaterally Card fine motor negotiation, shuffling deck, dealing deck, holding onto multiple cards in hand x 6 minutes  Roller to R IT band 2 minutes  Long ambulation for>833f with walking sticks, cueing for large steps and upright posture; one seated rest break.Ambulate negotiating turns, people, with one seated rest break. Negotiation of elevator, hallway, and gift shop with occasional standing rest breaks.    Pt educated throughout session about proper posture and technique with exercises. Improved exercise technique, movement at target joints, use of target muscles after min to mod verbal, visual, tactile cues.  Patient needs cueing for BIG swing and BIG amplitude. Patient needs cueing for correct BIG arm swing. Patient improves with practice. Patient needs cuing to consistently swing hisarms and  also swing reciprocally. Patient needs cuing to swing UE's reciprocally with weight shift exercise and with backward stepping and correct UE positioning.   Patient able to tolerate increased ambulatory duration as well as negotiation of elevator and crowded room while maintaining large step length. He continues to require education and reminder of importance of doing daily exercises as he yet again did not do his daily exercises yesterday; which in turn makes his prognosis guarded at this time. Patient performed card manipulation in hand without dropping card allowing for fine motor skill performance and tolerance. Patient will benefit from LSVT program to increase functional mobility, improve ADL performance, and improve quality of life                PT Education - 12/19/20 1225    Education Details exercise technique, body mechanics, need for compliance with daily program.    Person(s) Educated Patient    Methods Explanation;Demonstration;Tactile cues;Verbal cues    Comprehension Verbalized understanding;Returned demonstration;Verbal cues  required;Tactile cues required            PT Short Term Goals - 12/18/20 1152      PT SHORT TERM GOAL #1   Title Patient will be independent in home exercise program to improve strength/mobility for better functional independence with ADLs.    Baseline 5/23: intmittent compliance    Time 2    Period Weeks    Status Partially Met    Target Date 12/11/20      PT SHORT TERM GOAL #2   Title Patient will be able to stand 5 x from standard height chair without UE support to increase functional mobility    Baseline 5/2: requires use of BUE support 5/23: able to perform    Time 2    Period Weeks    Status Achieved    Target Date 12/11/20             PT Long Term Goals - 12/18/20 1152      PT LONG TERM GOAL #1   Title Patient will increase FOTO score to equal to or greater than  53%   to demonstrate statistically significant improvement in mobility and quality of life.    Baseline 5/2: 40% 5/23: 50%    Time 5    Period Weeks    Status Partially Met    Target Date 01/01/21      PT LONG TERM GOAL #2   Title Patient (> 65 years old) will complete five times sit to stand test in < 15 seconds without UE support indicating an increased LE strength and improved balance.    Baseline 5/2: unable to perform w/o UE support 15.35 seconds with heavy BUE support 5/23: 11.44 seconds w/o UE support    Time 5    Period Weeks    Status Partially Met    Target Date 01/01/21      PT LONG TERM GOAL #3   Title Patient will increase Berg Balance score by > 6 points (35/56)  to demonstrate decreased fall risk during functional activities.    Baseline 5/2: 29/56 5/23: 39/56    Time 4    Period Weeks    Status Achieved      PT LONG TERM GOAL #4   Title Patient will reduce timed up and go to <11 seconds to reduce fall risk and demonstrate improved transfer/gait ability.    Baseline 5/2: 14.21 w AD 5/23: 14.01 seconds w/o AD    Time 5  Period Weeks    Status Partially Met    Target Date  01/01/21      PT LONG TERM GOAL #5   Title Patient will be modified independent in walking on even/uneven surface with least restrictive assistive device, for 20+ minutes without rest break, reporting some difficulty or less to improve walking tolerance with community ambulation including grocery shopping, going to church,etc.    Baseline 5/2: at home can do 10 minutes with breaks 5/23: limited by pain in hip    Time 5    Period Weeks    Status On-going    Target Date 01/01/21                 Plan - 12/19/20 1227    Clinical Impression Statement Patient able to tolerate increased ambulatory duration as well as negotiation of elevator and crowded room while maintaining large step length. He continues to require education and reminder of importance of doing daily exercises as he yet again did not do his daily exercises yesterday; which in turn makes his prognosis guarded at this time. Patient performed card manipulation in hand without dropping card allowing for fine motor skill performance and tolerance. Patient will benefit from LSVT program to increase functional mobility, improve ADL performance, and improve quality of life    Personal Factors and Comorbidities Age;Comorbidity 3+;Past/Current Experience;Fitness;Time since onset of injury/illness/exacerbation    Comorbidities L1 vertebral compression fx, AAA, anemia, essential tremor.    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[employer] Evolving/Moderate complexity    Rehab Potential Fair    PT Frequency 4x / week    PT Duration Other (comment)   5 weeks   PT Treatment/Interventions ADLs/Self Care Home  Management;Cryotherapy;Electrical Stimulation;Iontophoresis 66m/ml Dexamethasone;Moist Heat;Traction;DME Instruction;Gait training;Stair training;Functional mobility training;Therapeutic activities;Therapeutic exercise;Balance training;Neuromuscular re-education;Manual techniques;Patient/family education;Passive range of motion;Dry needling;Vestibular;Canalith Repostioning;Taping;Visual/perceptual remediation/compensation;Energy conservation    PT Next Visit Plan LSVT program    PT Home Exercise Plan next session    Consulted and Agree with Plan of Care Patient;Family member/caregiver    Family Member Consulted wife           Patient will benefit from skilled therapeutic intervention in order to improve the following deficits and impairments:  Abnormal gait,Cardiopulmonary status limiting activity,Decreased activity tolerance,Decreased balance,Decreased knowledge of precautions,Decreased endurance,Decreased coordination,Decreased knowledge of use of DME,Decreased mobility,Decreased safety awareness,Difficulty walking,Decreased strength,Impaired flexibility,Impaired perceived functional ability,Impaired tone,Impaired UE functional use,Postural dysfunction,Improper body mechanics,Pain  Visit Diagnosis: [place]'s disease [place]  Other abnormalities of gait and mobility  Unsteadiness on feet     Problem List Patient Active Problem List   Diagnosis Date Noted  . Macular hole, right eye 03/18/2017   MJanna Arch PT, DPT   12/19/2020, 12:31 PM  [place] [facility] [address] [place] [address]Phone: [phone]  Fax:  [phone] Name: RMAKENA MCGRADYMRN: [id number] of Birth: [birth year]

## 2020-12-19 NOTE — Therapy (Signed)
[place] MAIN [facility] SERVICES [place], [place], [address] Phone: [phone]   Fax:  [phone]  Speech Language Pathology Treatment and Progress Note  Patient Details  Name: Nicolas Solis MRN: [id number] Date of Birth: Apr 09, 1951 Referring Provider (SLP): Dr. Manuella Ghazi   Encounter Date: 12/19/2020   Speech Therapy Progress Note  Dates of Reporting Period: 11/27/20 to 12/19/20  Objective: Patient has been seen for 10 speech therapy sessions this reporting period targeting dysarthria. He is making progress toward LTGs and met 3/3 STGs this reporting period. See skilled intervention, clinical impressions, and goals below for details.     End of Session - 12/19/20 1136    Visit Number 10    Number of Visits 17    Date for SLP Re-Evaluation 02/25/21    Authorization Type MCR    Authorization - Visit Number 10    Progress Note Due on Visit 10    SLP Start Time 1000    SLP Stop Time  1100    SLP Time Calculation (min) 60 min    Activity Tolerance Patient tolerated treatment well           Past Medical History:  Diagnosis Date  . Compression fracture of first cervical vertebra (HCC)   . Essential tremor   . Ileus [facility])     Past Surgical History:  Procedure Laterality Date  . [place] VITRECTOMY WITH 20 GAUGE MVR PORT Right 03/18/2017  . 25 GAUGE PARS PLANA VITRECTOMY WITH 20 GAUGE MVR PORT FOR MACULAR HOLE Right 03/18/2017   Procedure: 25 GAUGE PARS PLANA VITRECTOMY WITH 20 GAUGE MVR PORT FOR MACULAR HOLE; PHOTOCOAGULATION RIGHT EYE;  Surgeon: Hayden Pedro, MD;  Location: [place];  Service: Ophthalmology;  Laterality: Right;  . APPENDECTOMY    . COLONOSCOPY W/ POLYPECTOMY    . GAS INSERTION Right 03/18/2017   Procedure: INSERTION OF GAS RIGHT (C3F8);  Surgeon: Hayden Pedro, MD;  Location: [place];  Service: Ophthalmology;  Laterality: Right;  . GAS/FLUID EXCHANGE Right 03/18/2017   Procedure: GAS/FLUID  EXCHANGE RIGHT EYE;  Surgeon: Hayden Pedro, MD;  Location: [place];  Service: Ophthalmology;  Laterality: Right;  . JOINT REPLACEMENT Right    Hip  . MEMBRANE PEEL Right 03/18/2017   Procedure: MEMBRANE PEEL RIGHT EYE;  Surgeon: Hayden Pedro, MD;  Location: [place];  Service: Ophthalmology;  Laterality: Right;  . MOUTH SURGERY    . PHOTOCOAGULATION Left 03/18/2017   Procedure: PHOTOCOAGULATION LEFT EYE;  Surgeon: Hayden Pedro, MD;  Location: [place];  Service: Ophthalmology;  Laterality: Left;  . SERUM PATCH Right 03/18/2017   Procedure: SERUM PATCH RIGHT EYE;  Surgeon: Hayden Pedro, MD;  Location: [place];  Service: Ophthalmology;  Laterality: Right;    There were no vitals filed for this visit.   Subjective Assessment - 12/19/20 1128    Subjective "I forgot my gum"    Currently in Pain? No/denies                 ADULT SLP TREATMENT - 12/19/20 1129      General Information   Behavior/Cognition Alert;Cooperative    HPI Nicolas Solis is a 70 y.o. male referred by Dr. Manuella Ghazi for LSVT-LOUD therapy due to decreased volume of speech secondary to Parkinson's disease, diagnosed November 2021.      Cognitive-Linquistic Treatment   Treatment focused on Dysarthria;Patient/family/caregiver education    Skilled Treatment LSVT Daily tasks with minimal  to moderate cues for loudness and duration. Loud /a/ averaged 85 dB, duration 5.6 seconds. Patient used effort level 8/10 for loud /a/ task. Max F0 for high pitch glides was 290 Hz (86 dB avg), with moderate cues for pitch and vocal quality. Min F0 for low glides was 137 Hz (85 dB avg). Functional phrases averaged 88 dB with modified independence. Patient completed multi-paragraph reading task with an average of 82 dB, with minimal to moderate cues for loudness. Patient demonstrated a decrease in voice intensity/loudness when responding to spontaneous questions and during short conversation lasting 10 minutes in duration. Spontaneous  responses to "off the cuff" questions averaged 82 dB, with minimal to moderate cues for loudness due to fatigue.      Assessment / Recommendations / Plan   Plan Continue with current plan of care      Progression Toward Goals   Progression toward goals Progressing toward goals            SLP Education - 12/19/20 1154    Education Details need to use loud voice all the time    Person(s) Educated Patient    Methods Explanation    Comprehension Verbalized understanding;Verbal cues required;Need further instruction            SLP Short Term Goals - 12/19/20 1156      SLP SHORT TERM GOAL #1   Title The patient will complete Daily Tasks (Maximum duration "ah", High/Lows, and Functional Phrases) at average loudness >/= 80 dB and with loud, good quality voice with min cues.    Time 10    Period --   sessions   Status Achieved      SLP SHORT TERM GOAL #2   Title The patient will complete Hierarchal Speech Loudness reading drills (words/phrases, sentences) at average >/= 75 dB and with loud, good quality voice with min cues.    Time 10    Period --   sessions   Status Achieved      SLP SHORT TERM GOAL #3   Title The patient will participate in 5-8 minutes conversation, maintaining average loudness of 75 dB and loud, good quality voice with min cues.    Time 10    Period --   sessions   Status Achieved            SLP Long Term Goals - 12/19/20 1157      SLP LONG TERM GOAL #1   Title The patient will complete Daily Tasks (Maximum duration "ah", High/Lows, and Functional Phrases) at average loudness >/= 80 dB and with loud, good quality voice.    Time 4   or 17 sessions, for all LTGs   Period Weeks    Status On-going      SLP LONG TERM GOAL #2   Title The patient will complete Hierarchal Speech Loudness reading drills (words/phrases, sentences, and paragraph) at average >/= 75 dB and with loud, good quality voice.    Time 4    Period Weeks    Status On-going      SLP  LONG TERM GOAL #3   Title The patient will participate in 15-20 minutes conversation, maintaining average loudness of 75 dB and loud, good quality voice.    Time 4    Period Weeks    Status On-going      SLP LONG TERM GOAL #4   Title Patient will report improved communication effectiveness as measured by Communicative Effectiveness Survey.    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 Patient is responding well to intensity-based cues, and is improving ability to maintain vocal intensity and quality in structured tasks, with intermittent carryover to conversation, however cues remain necessary for consistency. Patient is making good progress toward goals and met 3/3 STG this reporting period. Continue skilled ST to maximize carryover of loudness and vocal quality, improve functional communication and intelligibility for conversations with family, friends and in the community.    Speech Therapy Frequency 4x / week    Duration 4 weeks    Treatment/Interventions Cueing hierarchy;SLP instruction and feedback;Functional tasks;Patient/family education    Potential to Achieve Goals Good    SLP Home Exercise Plan provided, see instruction section    Consulted and Agree with Plan of Care Patient           Patient will benefit from skilled therapeutic intervention in order to improve the following deficits and impairments:   Dysarthria and anarthria  Parkinson's disease [facility])    Problem List Patient Active Problem List   Diagnosis Date Noted  . Macular hole, right eye 03/18/2017   Brooke Dare, SLP Graduate Student   Aliene Altes 12/19/2020, 12:05 PM  [place] MAIN [facility] SERVICES [address] [place], [place], [address] Phone: [phone]   Fax:  [phone]   Name: Nicolas Solis MRN: [id number] Date of Birth: 12-08-1950

## 2020-12-20 ENCOUNTER — Ambulatory Visit: Payer: Medicare Other | Admitting: Speech Pathology

## 2020-12-20 ENCOUNTER — Other Ambulatory Visit: Payer: Self-pay

## 2020-12-20 ENCOUNTER — Ambulatory Visit: Payer: Medicare Other

## 2020-12-20 DIAGNOSIS — R471 Dysarthria and anarthria: Secondary | ICD-10-CM

## 2020-12-20 DIAGNOSIS — G20 Parkinson's disease: Secondary | ICD-10-CM

## 2020-12-20 DIAGNOSIS — R2689 Other abnormalities of gait and mobility: Secondary | ICD-10-CM

## 2020-12-20 DIAGNOSIS — R2681 Unsteadiness on feet: Secondary | ICD-10-CM

## 2020-12-20 NOTE — Therapy (Signed)
[place] MAIN [facility] SERVICES [address] [place], [place], [address] Phone: [phone]   Fax:  [phone]  Physical Therapy Treatment  Patient Details  Name: Nicolas Solis MRN: [id number] Date of Birth: May 06, 1951 Referring Provider (PT): Jennings Books   Encounter Date: 12/20/2020   PT End of Session - 12/20/20 1309    Visit Number 12    Number of Visits 17    Date for PT Re-Evaluation 01/01/21    Authorization Type 2/10 PN 5/23    PT Start Time 1102    PT Stop Time 1159    PT Time Calculation (min) 57 min    Equipment Utilized During Treatment Gait belt    Activity Tolerance Patient tolerated treatment well    Behavior During Therapy WFL for tasks assessed/performed           Past Medical History:  Diagnosis Date  . Compression fracture of first cervical vertebra (HCC)   . Essential tremor   . Ileus [facility])     Past Surgical History:  Procedure Laterality Date  . [place] VITRECTOMY WITH 20 GAUGE MVR PORT Right 03/18/2017  . 25 GAUGE PARS PLANA VITRECTOMY WITH 20 GAUGE MVR PORT FOR MACULAR HOLE Right 03/18/2017   Procedure: 25 GAUGE PARS PLANA VITRECTOMY WITH 20 GAUGE MVR PORT FOR MACULAR HOLE; PHOTOCOAGULATION RIGHT EYE;  Surgeon: Hayden Pedro, MD;  Location: [place];  Service: Ophthalmology;  Laterality: Right;  . APPENDECTOMY    . COLONOSCOPY W/ POLYPECTOMY    . GAS INSERTION Right 03/18/2017   Procedure: INSERTION OF GAS RIGHT (C3F8);  Surgeon: Hayden Pedro, MD;  Location: [place];  Service: Ophthalmology;  Laterality: Right;  . GAS/FLUID EXCHANGE Right 03/18/2017   Procedure: GAS/FLUID EXCHANGE RIGHT EYE;  Surgeon: Hayden Pedro, MD;  Location: [place];  Service: Ophthalmology;  Laterality: Right;  . JOINT REPLACEMENT Right    Hip  . MEMBRANE PEEL Right 03/18/2017   Procedure: MEMBRANE PEEL RIGHT EYE;  Surgeon: Hayden Pedro, MD;  Location: [place];  Service: Ophthalmology;  Laterality: Right;  . MOUTH  SURGERY    . PHOTOCOAGULATION Left 03/18/2017   Procedure: PHOTOCOAGULATION LEFT EYE;  Surgeon: Hayden Pedro, MD;  Location: [place];  Service: Ophthalmology;  Laterality: Left;  . SERUM PATCH Right 03/18/2017   Procedure: SERUM PATCH RIGHT EYE;  Surgeon: Hayden Pedro, MD;  Location: [place];  Service: Ophthalmology;  Laterality: Right;    There were no vitals filed for this visit.   Subjective Assessment - 12/20/20 1103    Subjective Patient reports he did his HEP yesterday. Patient reports he is tired today.    Patient is accompained by: Family member    Pertinent History Patient is a 70 year old male who presents for LSVT BIG and LOUD evaluation. Patient recently diagnosed with Parkinson's disease (05/2020) and is Sinemet. He has increased urinary frequency and nocturia as well as low back pain with ambulation. Cogwheeling with his R more than L is noted per neurologist. Was doing [employer] in [place].  Has been using a cane since 2017 and now is on a walker. PMH includes L1 vertebral compression fx, AAA, anemia, essential tremor.    Limitations Lifting;Standing;Walking;House hold activities;Other (comment)    How long can you sit comfortably? R hip pain    How long can you stand comfortably? haven't tried: needs to hold on 2 minutes    How long can you  walk comfortably? 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    Patient Stated Goals to be able to walk better    Currently in Pain? No/denies             Treatment: Patient seen for LSVT Daily Session Adapted Daily Exercises for facilitation/coordination of movement Sustained movements are designed to rescale the amplitude of movement output for generalization to daily functional activities .Performed as follows for 1 set of 10 repetitions each multidirectional sustained movements  1) Floor to  ceiling , cues to hold for 10 seconds, needs modeling for correct positions, VC to reach out furtherand to reach up to the ceiling 2) Side to side multidirectional Repetitive movements performed in sittingand are designed to provide retraining effort needed for sustained muscle activation in tasks, cues to lean fwd and hold position x 10 counts: Patient does not move forward to side sitting to forward smoothly and is not able to transition or stretch out leg very far 3) Step and reach forward , cues for good knee flex, cues to move UE and LE together, cues to rotate arms up to pronate forearms while holding onto chair 4) Step and reach sideways, cues to turn head sideways, large step sideways and turn head back to neutral, and to rotate arms and pronate forearms while holding onto chair with opposite hand.  5) Step and reach backwards, cues for SUE back, getting toe up and bending back knee while holding onto chair and then swinging arm forward and stomping  6) Rock and reach forward/backward, cues to reach far fwd with UE, patient not comfortable with feet apart, not rocking very far to get a good weight shift, not able to raise heel or raise toes much with SUE on chair.  7) Rock and reach sideways, cues for twist and look behind, only rotates sideways, unable to twist around or turn to look behind with one hand on chair  functional component task with supervision 5 reps and simulated activities for: 1. Sit to standx 5needs cues for nose over toes and explosive movement.  2.signing name 3.put pant leg on/off 4.tall posture walking 5.Large steps walking:   Specific task training:  sign name 5x on paper with standard size pen pull leg onto knee 5x each side to increase flexibility bilaterally Unlocking medicine cap, opening up and getting three pills and putting back x 5 trials Putting pills back into bottle 20 "pills" (buttons/beads) Slide shoe on/off 5x each LE Roller to R IT  band 2 minutes Supine IT band stretch 30 second hold Supine to sit sit to supine  KoreBalance penguin with focus on weight shift, target negotiation and spatial awareness x 3 minutes Walk to table 5 ft away put water bottle on table and then return stepping backwards x 5 trials  RTB row 15x  Tie/untie shoe 5x   Long ambulation for462ft with walking sticks, cueing for large steps and upright posture; one seated rest break.Ambulate negotiating turns, people, with one seated rest break.   Pt educated throughout session about proper posture and technique with exercises. Improved exercise technique, movement at target joints, use of target muscles after min to mod verbal, visual, tactile cues.  Patient needs cueing for BIG swing and BIG amplitude. Patient needs cueing for correct BIG arm swing. Patient improves with practice. Patient needs cuing to consistently swing hisarms and also swing reciprocally.  Patient needs cuing to swing UE's reciprocally with weight shift exercise and with backward stepping and correct UE positioning.   Patient's is able to perform daily exercises with a quicker pace allowing for increased interventions to be tolerated throughout session. Patient is challenged with prolonged standing due to pain in hip that is relieved by rolling and stretching. Patient has purchased a rolling stick for home and is now utilizing it for pain relief. He requires frequent cueing for big motions and upright posture however posture is limited by back pain as well. Patient will benefit from LSVT program to increase functional mobility, improve ADL performance, and improve quality of life                         PT Education - 12/20/20 1308    Education Details posture, daily exercises, LSVT BIG    Person(s) Educated Patient    Methods Explanation;Demonstration;Tactile cues;Verbal cues    Comprehension Verbalized understanding;Returned demonstration;Verbal  cues required;Tactile cues required            PT Short Term Goals - 12/18/20 1152      PT SHORT TERM GOAL #1   Title Patient will be independent in home exercise program to improve strength/mobility for better functional independence with ADLs.    Baseline 5/23: intmittent compliance    Time 2    Period Weeks    Status Partially Met    Target Date 12/11/20      PT SHORT TERM GOAL #2   Title Patient will be able to stand 5 x from standard height chair without UE support to increase functional mobility    Baseline 5/2: requires use of BUE support 5/23: able to perform    Time 2    Period Weeks    Status Achieved    Target Date 12/11/20             PT Long Term Goals - 12/18/20 1152      PT LONG TERM GOAL #1   Title Patient will increase FOTO score to equal to or greater than  53%   to demonstrate statistically significant improvement in mobility and quality of life.    Baseline 5/2: 40% 5/23: 50%    Time 5    Period Weeks    Status Partially Met    Target Date 01/01/21      PT LONG TERM GOAL #2   Title Patient (> 58 years old) will complete five times sit to stand test in < 15 seconds without UE support indicating an increased LE strength and improved balance.    Baseline 5/2: unable to perform w/o UE support 15.35 seconds with heavy BUE support 5/23: 11.44 seconds w/o UE support    Time 5    Period Weeks    Status Partially Met    Target Date 01/01/21      PT LONG TERM GOAL #3   Title Patient will increase Berg Balance score by > 6 points (35/56)  to demonstrate decreased fall risk during functional activities.    Baseline 5/2: 29/56 5/23: 39/56    Time 4    Period Weeks    Status Achieved      PT LONG TERM GOAL #4   Title Patient will reduce timed up and go to <11 seconds to reduce fall risk and demonstrate improved transfer/gait ability.    Baseline 5/2: 14.21 w AD 5/23: 14.01 seconds w/o AD    Time 5  Period Weeks    Status Partially Met    Target Date  01/01/21      PT LONG TERM GOAL #5   Title Patient will be modified independent in walking on even/uneven surface with least restrictive assistive device, for 20+ minutes without rest break, reporting some difficulty or less to improve walking tolerance with community ambulation including grocery shopping, going to church,etc.    Baseline 5/2: at home can do 10 minutes with breaks 5/23: limited by pain in hip    Time 5    Period Weeks    Status On-going    Target Date 01/01/21                 Plan - 12/20/20 1309    Clinical Impression Statement Patient's is able to perform daily exercises with a quicker pace allowing for increased interventions to be tolerated throughout session. Patient is challenged with prolonged standing due to pain in hip that is relieved by rolling and stretching. Patient has purchased a rolling stick for home and is now utilizing it for pain relief. He requires frequent cueing for big motions and upright posture however posture is limited by back pain as well. Patient will benefit from LSVT program to increase functional mobility, improve ADL performance, and improve quality of life    Personal Factors and Comorbidities Age;Comorbidity 3+;Past/Current Experience;Fitness;Time since onset of injury/illness/exacerbation    Comorbidities L1 vertebral compression fx, AAA, anemia, essential tremor.    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[employer] officer Evolving/Moderate complexity    Rehab Potential Fair    PT Frequency 4x / week    PT Duration Other (comment)   5 weeks   PT Treatment/Interventions ADLs/Self Care Home  Management;Cryotherapy;Electrical Stimulation;Iontophoresis 41m/ml Dexamethasone;Moist Heat;Traction;DME Instruction;Gait training;Stair training;Functional mobility training;Therapeutic activities;Therapeutic exercise;Balance training;Neuromuscular re-education;Manual techniques;Patient/family education;Passive range of motion;Dry needling;Vestibular;Canalith Repostioning;Taping;Visual/perceptual remediation/compensation;Energy conservation    PT Next Visit Plan LSVT program    PT Home Exercise Plan next session    Consulted and Agree with Plan of Care Patient;Family member/caregiver    Family Member Consulted wife           Patient will benefit from skilled therapeutic intervention in order to improve the following deficits and impairments:  Abnormal gait,Cardiopulmonary status limiting activity,Decreased activity tolerance,Decreased balance,Decreased knowledge of precautions,Decreased endurance,Decreased coordination,Decreased knowledge of use of DME,Decreased mobility,Decreased safety awareness,Difficulty walking,Decreased strength,Impaired flexibility,Impaired perceived functional ability,Impaired tone,Impaired UE functional use,Postural dysfunction,Improper body mechanics,Pain  Visit Diagnosis: Dysarthria and anarthria  Other abnormalities of gait and mobility  Parkinson's disease ([place]  Unsteadiness on feet     Problem List Patient Active Problem List   Diagnosis Date Noted  . Macular hole, right eye 03/18/2017   MJanna Arch PT, DPT   12/20/2020, 1:10 PM  [place] [facility] [address] [place] [address]Phone: [phone]  Fax:  [phone] Name: Nicolas SINNINGMRN: [id number] of Birth: 502/16/1952

## 2020-12-20 NOTE — Therapy (Signed)
[place] [facility] [place] MAIN [facility] SERVICES [address] [place], [place], [address] Phone: [phone]   Fax:  [phone]  Speech Language Pathology Treatment  Patient Details  Name: Nicolas Solis MRN: [id number] Date of Birth: 03/28/1951 Referring Provider (SLP): Dr. Sherryll Burger   Encounter Date: 12/20/2020   End of Session - 12/20/20 1143    Visit Number 11    Number of Visits 17    Date for SLP Re-Evaluation 02/25/21    Authorization Type MCR    Authorization - Visit Number 10    Progress Note Due on Visit 10    SLP Start Time 1100    SLP Stop Time  1200    SLP Time Calculation (min) 60 min    Activity Tolerance Patient tolerated treatment well           Past Medical History:  Diagnosis Date  . Compression fracture of first cervical vertebra (HCC)   . Essential tremor   . Ileus [facility])     Past Surgical History:  Procedure Laterality Date  . 25 GAUGE PARS PLANA VITRECTOMY WITH 20 GAUGE MVR PORT Right 03/18/2017  . 25 GAUGE PARS PLANA VITRECTOMY WITH 20 GAUGE MVR PORT FOR MACULAR HOLE Right 03/18/2017   Procedure: 25 GAUGE PARS PLANA VITRECTOMY WITH 20 GAUGE MVR PORT FOR MACULAR HOLE; PHOTOCOAGULATION RIGHT EYE;  Surgeon: Sherrie George, MD;  Location: [facility] OR;  Service: Ophthalmology;  Laterality: Right;  . APPENDECTOMY    . COLONOSCOPY W/ POLYPECTOMY    . GAS INSERTION Right 03/18/2017   Procedure: INSERTION OF GAS RIGHT (C3F8);  Surgeon: Sherrie George, MD;  Location: [facility] OR;  Service: Ophthalmology;  Laterality: Right;  . GAS/FLUID EXCHANGE Right 03/18/2017   Procedure: GAS/FLUID EXCHANGE RIGHT EYE;  Surgeon: Sherrie George, MD;  Location: [facility] OR;  Service: Ophthalmology;  Laterality: Right;  . JOINT REPLACEMENT Right    Hip  . MEMBRANE PEEL Right 03/18/2017   Procedure: MEMBRANE PEEL RIGHT EYE;  Surgeon: Sherrie George, MD;  Location: [facility] OR;  Service: Ophthalmology;  Laterality: Right;  . MOUTH SURGERY    . PHOTOCOAGULATION Left  03/18/2017   Procedure: PHOTOCOAGULATION LEFT EYE;  Surgeon: Sherrie George, MD;  Location: [facility];  Service: Ophthalmology;  Laterality: Left;  . SERUM PATCH Right 03/18/2017   Procedure: SERUM PATCH RIGHT EYE;  Surgeon: Sherrie George, MD;  Location: [facility] OR;  Service: Ophthalmology;  Laterality: Right;    There were no vitals filed for this visit.   Subjective Assessment - 12/20/20 1112    Subjective "I brought my gum today."    Currently in Pain? No/denies                 ADULT SLP TREATMENT - 12/20/20 1115      General Information   Behavior/Cognition Alert;Cooperative    HPI Nicolas Solis is a 70 y.o. male referred by Dr. Sherryll Burger for LSVT-LOUD therapy due to decreased volume of speech secondary to Parkinson's disease, diagnosed November 2021.      Cognitive-Linquistic Treatment   Treatment focused on Dysarthria;Patient/family/caregiver education    Skilled Treatment LSVT Daily tasks with minimal cues for loudness. Loud /a/ averaged 84 dB, duration 4.8 seconds. Patient used effort level 8/10 for loud /a/ task. Max F0 for high pitch glides was 298 Hz (88 dB avg). Min F0 for low glides was 140 Hz (85 dB avg). Functional phrases averaged 88 dB with modified independence. Patient completed multi-paragraph reading task with  an average of 84 dB, with minimal cues for loudness. Patient was able to carryover loudness during short conversational periods lasting ~ 5-10 minutes in duration. Spontaneous responses to "off the cuff" questions averaged 80 dB, with minimal cues for loudness. SLP provided carryover task for patient to practice outside of treatment room.      Assessment / Recommendations / Plan   Plan Continue with current plan of care      Progression Toward Goals   Progression toward goals Progressing toward goals            SLP Education - 12/20/20 1139    Education Details Told patient to use loud voice when ordering at [employer]) Educated Patient     Methods Explanation;Verbal cues    Comprehension Returned demonstration;Need further instruction            SLP Short Term Goals - 12/19/20 1156      SLP SHORT TERM GOAL #1   Title The patient will complete Daily Tasks (Maximum duration "ah", High/Lows, and Functional Phrases) at average loudness >/= 80 dB and with loud, good quality voice with min cues.    Time 10    Period --   sessions   Status Achieved      SLP SHORT TERM GOAL #2   Title The patient will complete Hierarchal Speech Loudness reading drills (words/phrases, sentences) at average >/= 75 dB and with loud, good quality voice with min cues.    Time 10    Period --   sessions   Status Achieved      SLP SHORT TERM GOAL #3   Title The patient will participate in 5-8 minutes conversation, maintaining average loudness of 75 dB and loud, good quality voice with min cues.    Time 10    Period --   sessions   Status Achieved            SLP Long Term Goals - 12/19/20 1157      SLP LONG TERM GOAL #1   Title The patient will complete Daily Tasks (Maximum duration "ah", High/Lows, and Functional Phrases) at average loudness >/= 80 dB and with loud, good quality voice.    Time 4   or 17 sessions, for all LTGs   Period Weeks    Status On-going      SLP LONG TERM GOAL #2   Title The patient will complete Hierarchal Speech Loudness reading drills (words/phrases, sentences, and paragraph) at average >/= 75 dB and with loud, good quality voice.    Time 4    Period Weeks    Status On-going      SLP LONG TERM GOAL #3   Title The patient will participate in 15-20 minutes conversation, maintaining average loudness of 75 dB and loud, good quality voice.    Time 4    Period Weeks    Status On-going      SLP LONG TERM GOAL #4   Title Patient will report improved communication effectiveness as measured by Communicative Effectiveness Survey.    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 Patient is responding well to intensity-based cues,  and is improving ability to maintain vocal intensity and quality in structured tasks, with intermittent carryover to conversation, however cues remain necessary for consistency. Provided carryover task for patient to practice outside of speech room to improve loudnesss/vocal intensity. Continue skilled ST to maximize carryover of loudness and vocal quality, improve functional communication and intelligibility for conversations with family, friends and in the community.    Speech Therapy Frequency 4x / week    Duration 4 weeks    Treatment/Interventions Cueing hierarchy;SLP instruction and feedback;Functional tasks;Patient/family education    Potential to Achieve Goals Good    SLP Home Exercise Plan provided, see instruction section    Consulted and Agree with Plan of Care Patient           Patient will benefit from skilled therapeutic intervention in order to improve the following deficits and impairments:   Dysarthria and anarthria  Other abnormalities of gait and mobility    Problem List Patient Active Problem List   Diagnosis Date Noted  . Macular hole, right eye 03/18/2017   Essie Hart, SLP Graduate Student  Essie Hart 12/20/2020, 12:01 PM  [place] [facility] MAIN [facility] SERVICES [address] [place], [place], [address] Phone: [phone]   Fax:  [phone]   Name: Nicolas Solis MRN: [id number] Date of Birth: 05/05/1951

## 2020-12-21 ENCOUNTER — Ambulatory Visit: Payer: Medicare Other | Admitting: Speech Pathology

## 2020-12-21 ENCOUNTER — Ambulatory Visit: Payer: Medicare Other

## 2020-12-21 DIAGNOSIS — R471 Dysarthria and anarthria: Secondary | ICD-10-CM | POA: Diagnosis not present

## 2020-12-21 DIAGNOSIS — R2681 Unsteadiness on feet: Secondary | ICD-10-CM

## 2020-12-21 DIAGNOSIS — R2689 Other abnormalities of gait and mobility: Secondary | ICD-10-CM

## 2020-12-21 DIAGNOSIS — G20 Parkinson's disease: Secondary | ICD-10-CM

## 2020-12-21 NOTE — Therapy (Signed)
Nicolas Solis MAIN [facility] SERVICES [address] [place], [place], [address] Phone: [phone]   Fax:  [phone]  Speech Language Pathology Treatment  Patient Details  Name: Nicolas Solis MRN: [id number] Date of Birth: [birth year] Referring Provider (SLP): Dr. Sherryll Burger   Encounter Date: 12/21/2020   End of Session - 12/21/20 1356    Visit Number 12    Number of Visits 17    Date for SLP Re-Evaluation 02/25/21    Authorization Type MCR    Progress Note Due on Visit 10    SLP Start Time 1100    SLP Stop Time  1200    SLP Time Calculation (min) 60 min    Activity Tolerance Patient tolerated treatment well           Past Medical History:  Diagnosis Date  . Compression fracture of first cervical vertebra (HCC)   . Essential tremor   . Ileus [facility])     Past Surgical History:  Procedure Laterality Date  . 25 GAUGE PARS PLANA VITRECTOMY WITH 20 GAUGE MVR PORT Right 03/18/2017  . 25 GAUGE PARS PLANA VITRECTOMY WITH 20 GAUGE MVR PORT FOR MACULAR HOLE Right 03/18/2017   Procedure: 25 GAUGE PARS PLANA VITRECTOMY WITH 20 GAUGE MVR PORT FOR MACULAR HOLE; PHOTOCOAGULATION RIGHT EYE;  Surgeon: Sherrie George, MD;  Location: [facility] OR;  Service: Ophthalmology;  Laterality: Right;  . APPENDECTOMY    . COLONOSCOPY W/ POLYPECTOMY    . GAS INSERTION Right 03/18/2017   Procedure: INSERTION OF GAS RIGHT (C3F8);  Surgeon: Sherrie George, MD;  Location: [facility] OR;  Service: Ophthalmology;  Laterality: Right;  . GAS/FLUID EXCHANGE Right 03/18/2017   Procedure: GAS/FLUID EXCHANGE RIGHT EYE;  Surgeon: Sherrie George, MD;  Location: [facility] OR;  Service: Ophthalmology;  Laterality: Right;  . JOINT REPLACEMENT Right    Hip  . MEMBRANE PEEL Right 03/18/2017   Procedure: MEMBRANE PEEL RIGHT EYE;  Surgeon: Sherrie George, MD;  Location: [facility] OR;  Service: Ophthalmology;  Laterality: Right;  . MOUTH SURGERY    . PHOTOCOAGULATION Left 03/18/2017   Procedure: PHOTOCOAGULATION  LEFT EYE;  Surgeon: Sherrie George, MD;  Location: [facility] OR;  Service: Ophthalmology;  Laterality: Left;  . SERUM PATCH Right 03/18/2017   Procedure: SERUM PATCH RIGHT EYE;  Surgeon: Sherrie George, MD;  Location: [facility] OR;  Service: Ophthalmology;  Laterality: Right;    There were no vitals filed for this visit.   Subjective Assessment - 12/21/20 1245    Subjective "My wife said great!"    Currently in Pain? No/denies                 ADULT SLP TREATMENT - 12/21/20 1247      General Information   Behavior/Cognition Alert;Cooperative    HPI Nicolas Solis is a 70 y.o. male referred by Dr. Sherryll Burger for LSVT-LOUD therapy due to decreased volume of speech secondary to Parkinson's disease, diagnosed November 2021.      Cognitive-Linquistic Treatment   Treatment focused on Dysarthria;Patient/family/caregiver education    Skilled Treatment LSVT Daily tasks with minimal cues for loudness and duration. Loud /a/ averaged 84 dB, duration 4.7 seconds. Patient used effort level 8/10 for loud /a/ task. Max F0 for high pitch glides was 280 Hz (87 dB avg). Min F0 for low glides was 140 Hz (84 dB avg). Functional phrases averaged 87 dB with modified independence. Patient completed multi-paragraph reading task with an average of 83 dB, with minimal  cues for loudness. Patient was able to carryover loudness during a short conversational period lasting ~ 10 minutes in duration, with occasional dips in vocal intensity/loudness. Spontaneous responses to "off the cuff" questions averaged 80 dB, with minimal to moderate cues for loudness.      Assessment / Recommendations / Plan   Plan Continue with current plan of care      Progression Toward Goals   Progression toward goals Progressing toward goals            SLP Education - 12/21/20 1348    Education Details Educated patient to use loud voice all the time    Person(s) Educated Patient    Methods Verbal cues;Demonstration    Comprehension Need  further instruction;Verbalized understanding;Returned demonstration            SLP Short Term Goals - 12/19/20 1156      SLP SHORT TERM GOAL #1   Title The patient will complete Daily Tasks (Maximum duration "ah", High/Lows, and Functional Phrases) at average loudness >/= 80 dB and with loud, good quality voice with min cues.    Time 10    Period --   sessions   Status Achieved      SLP SHORT TERM GOAL #2   Title The patient will complete Hierarchal Speech Loudness reading drills (words/phrases, sentences) at average >/= 75 dB and with loud, good quality voice with min cues.    Time 10    Period --   sessions   Status Achieved      SLP SHORT TERM GOAL #3   Title The patient will participate in 5-8 minutes conversation, maintaining average loudness of 75 dB and loud, good quality voice with min cues.    Time 10    Period --   sessions   Status Achieved            SLP Long Term Goals - 12/19/20 1157      SLP LONG TERM GOAL #1   Title The patient will complete Daily Tasks (Maximum duration "ah", High/Lows, and Functional Phrases) at average loudness >/= 80 dB and with loud, good quality voice.    Time 4   or 17 sessions, for all LTGs   Period Weeks    Status On-going      SLP LONG TERM GOAL #2   Title The patient will complete Hierarchal Speech Loudness reading drills (words/phrases, sentences, and paragraph) at average >/= 75 dB and with loud, good quality voice.    Time 4    Period Weeks    Status On-going      SLP LONG TERM GOAL #3   Title The patient will participate in 15-20 minutes conversation, maintaining average loudness of 75 dB and loud, good quality voice.    Time 4    Period Weeks    Status On-going      SLP LONG TERM GOAL #4   Title Patient will report improved communication effectiveness as measured by Communicative Effectiveness Survey.    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 Patient is responding well to intensity-based cues, and is improving ability to maintain vocal intensity and quality  in structured tasks, with intermittent carryover to conversation, however patient appeared to be slighly more fatigued today, which resulted in more frequent dips in vocal intensity and loudness. Continue skilled ST to maximize carryover of loudness and vocal quality, improve functional communication and intelligibility for conversations with family, friends and in the community.    Speech Therapy Frequency 4x / week    Duration 4 weeks    Treatment/Interventions Cueing hierarchy;SLP instruction and feedback;Functional tasks;Patient/family education    Potential to Achieve Goals Good    SLP Home Exercise Plan provided, see instruction section    Consulted and Agree with Plan of Care Patient           Patient will benefit from skilled therapeutic intervention in order to improve the following deficits and impairments:   No diagnosis found.    Problem List Patient Active Problem List   Diagnosis Date Noted  . Macular hole, right eye 03/18/2017   Essie Hart, SLP Graduate Student  Essie Hart 12/21/2020, 1:57 PM  [place] [employer] MAIN [facility] SERVICES [address] [place], [place], [address] Phone: [phone]   Fax:  [phone]   Name: DAIN LASETER MRN: [id number] Date of Birth: Jun 17, 1951

## 2020-12-21 NOTE — Therapy (Signed)
[place] MAIN [facility] SERVICES [address] [place], [place], [address] Phone: [phone]   Fax:  [phone]  Physical Therapy Treatment  Patient Details  Name: Nicolas Solis MRN: [id number] Date of Birth: 1951-02-08 Referring Provider (PT): Jennings Books   Encounter Date: 12/21/2020   PT End of Session - 12/21/20 1344    Visit Number 13    Number of Visits 17    Date for PT Re-Evaluation 01/01/21    Authorization Type 3/10 PN 5/23    PT Start Time 1102    PT Stop Time 1159    PT Time Calculation (min) 57 min    Equipment Utilized During Treatment Gait belt    Activity Tolerance Patient tolerated treatment well    Behavior During Therapy WFL for tasks assessed/performed           Past Medical History:  Diagnosis Date  . Compression fracture of first cervical vertebra (HCC)   . Essential tremor   . Ileus [facility])     Past Surgical History:  Procedure Laterality Date  . [place] VITRECTOMY WITH 20 GAUGE MVR PORT Right 03/18/2017  . 25 GAUGE PARS PLANA VITRECTOMY WITH 20 GAUGE MVR PORT FOR MACULAR HOLE Right 03/18/2017   Procedure: 25 GAUGE PARS PLANA VITRECTOMY WITH 20 GAUGE MVR PORT FOR MACULAR HOLE; PHOTOCOAGULATION RIGHT EYE;  Surgeon: Hayden Pedro, MD;  Location: [place];  Service: Ophthalmology;  Laterality: Right;  . APPENDECTOMY    . COLONOSCOPY W/ POLYPECTOMY    . GAS INSERTION Right 03/18/2017   Procedure: INSERTION OF GAS RIGHT (C3F8);  Surgeon: Hayden Pedro, MD;  Location: [place];  Service: Ophthalmology;  Laterality: Right;  . GAS/FLUID EXCHANGE Right 03/18/2017   Procedure: GAS/FLUID EXCHANGE RIGHT EYE;  Surgeon: Hayden Pedro, MD;  Location: [place];  Service: Ophthalmology;  Laterality: Right;  . JOINT REPLACEMENT Right    Hip  . MEMBRANE PEEL Right 03/18/2017   Procedure: MEMBRANE PEEL RIGHT EYE;  Surgeon: Hayden Pedro, MD;  Location: [place];  Service: Ophthalmology;  Laterality: Right;  . MOUTH  SURGERY    . PHOTOCOAGULATION Left 03/18/2017   Procedure: PHOTOCOAGULATION LEFT EYE;  Surgeon: Hayden Pedro, MD;  Location: [place];  Service: Ophthalmology;  Laterality: Left;  . SERUM PATCH Right 03/18/2017   Procedure: SERUM PATCH RIGHT EYE;  Surgeon: Hayden Pedro, MD;  Location: [place];  Service: Ophthalmology;  Laterality: Right;    There were no vitals filed for this visit.   Subjective Assessment - 12/21/20 1343    Subjective Patient did his HEP yesterday. Is very tired from HEP +Rock steady but able to perform both.    Patient is accompained by: Family member    Pertinent History Patient is a 70 year old male who presents for LSVT BIG and LOUD evaluation. Patient recently diagnosed with Parkinson's disease (05/2020) and is Sinemet. He has increased urinary frequency and nocturia as well as low back pain with ambulation. Cogwheeling with his R more than L is noted per neurologist. Was doing [employer] in [place].  Has been using a cane since 2017 and now is on a walker. PMH includes L1 vertebral compression fx, AAA, anemia, essential tremor.    Limitations Lifting;Standing;Walking;House hold activities;Other (comment)    How long can you sit comfortably? R hip pain    How long can you stand comfortably? haven't tried: needs to hold on 2 minutes  How long can you walk comfortably? 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    Patient Stated Goals to be able to walk better    Currently in Pain? No/denies                     Treatment: Patient seen for LSVT Daily Session Adapted Daily Exercises for facilitation/coordination of movement Sustained movements are designed to rescale the amplitude of movement output for generalization to daily functional activities .Performed as follows for 1 set of 10 repetitions each multidirectional  sustained movements  1) Floor to ceiling , cues to hold for 10 seconds, needs modeling for correct positions, VC to reach out furtherand to reach up to the ceiling 2) Side to side multidirectional Repetitive movements performed in sittingand are designed to provide retraining effort needed for sustained muscle activation in tasks, cues to lean fwd and hold position x 10 counts: Patient does not move forward to side sitting to forward smoothly and is not able to transition or stretch out leg very far 3) Step and reach forward , cues for good knee flex, cues to move UE and LE together, cues to rotate arms up to pronate forearms while holding onto chair 4) Step and reach sideways, cues to turn head sideways, large step sideways and turn head back to neutral, and to rotate arms and pronate forearms while holding onto chair with opposite hand.  5) Step and reach backwards, cues for SUE back, getting toe up and bending back knee while holding onto chair and then swinging arm forward and stomping  6) Rock and reach forward/backward, cues to reach far fwd with UE, patient not comfortable with feet apart, not rocking very far to get a good weight shift, not able to raise heel or raise toes much with SUE on chair.  7) Rock and reach sideways, cues for twist and look behind, only rotates sideways, unable to twist around or turn to look behind with one hand on chair  functional component task with supervision 5 reps and simulated activities for: 1. Sit to standx 5needs cues for nose over toes and explosive movement.  2.signing name 3.put pant leg on/off 4.tall posture walking 5.Large steps walking:   Specific task training:  sign name 5x on paper with standard size pen pull leg onto knee 5x each side to increase flexibility bilaterally Slide shoe on/off 5x each LE Roller to R IT band 2 minutes Card game for coordination, dual task with cognition challenge, holding multiple items in hand,  upright posture with task x 7 minutes  Tie/untie shoe 5x  Standing reaching for ball outside BOS and throw into basketball hoop x 15 balls   Long ambulation for470f with walking sticks, cueing for large steps and upright posture; one seated rest break.Ambulate negotiating turns, people, with one seated rest break.   Pt educated throughout session about proper posture and technique with exercises. Improved exercise technique, movement at target joints, use of target muscles after min to mod verbal, visual, tactile cues.  Patient is able to perform interventions with minimal pain increase after rolling today. He is challenged with prolonged standing with increased trunk flexion as time progresses due to fatigue of postural muscles and pain in hip. Fine motor skills are improving with decreased tremors of hands with interventions.  Patient needs cuing to swing UE's reciprocally with weight shift  exercise and with backward stepping and correct UE positioning.                    PT Education - 12/21/20 1343    Education Details exercise technique, LSVT BIG    Person(s) Educated Patient    Methods Explanation;Demonstration;Tactile cues;Verbal cues    Comprehension Verbalized understanding;Returned demonstration;Verbal cues required;Tactile cues required            PT Short Term Goals - 12/18/20 1152      PT SHORT TERM GOAL #1   Title Patient will be independent in home exercise program to improve strength/mobility for better functional independence with ADLs.    Baseline 5/23: intmittent compliance    Time 2    Period Weeks    Status Partially Met    Target Date 12/11/20      PT SHORT TERM GOAL #2   Title Patient will be able to stand 5 x from standard height chair without UE support to increase functional mobility    Baseline 5/2: requires use of BUE support 5/23: able to perform    Time 2    Period Weeks    Status Achieved    Target Date 12/11/20              PT Long Term Goals - 12/18/20 1152      PT LONG TERM GOAL #1   Title Patient will increase FOTO score to equal to or greater than  53%   to demonstrate statistically significant improvement in mobility and quality of life.    Baseline 5/2: 40% 5/23: 50%    Time 5    Period Weeks    Status Partially Met    Target Date 01/01/21      PT LONG TERM GOAL #2   Title Patient (> 15 years old) will complete five times sit to stand test in < 15 seconds without UE support indicating an increased LE strength and improved balance.    Baseline 5/2: unable to perform w/o UE support 15.35 seconds with heavy BUE support 5/23: 11.44 seconds w/o UE support    Time 5    Period Weeks    Status Partially Met    Target Date 01/01/21      PT LONG TERM GOAL #3   Title Patient will increase Berg Balance score by > 6 points (35/56)  to demonstrate decreased fall risk during functional activities.    Baseline 5/2: 29/56 5/23: 39/56    Time 4    Period Weeks    Status Achieved      PT LONG TERM GOAL #4   Title Patient will reduce timed up and go to <11 seconds to reduce fall risk and demonstrate improved transfer/gait ability.    Baseline 5/2: 14.21 w AD 5/23: 14.01 seconds w/o AD    Time 5    Period Weeks    Status Partially Met    Target Date 01/01/21      PT LONG TERM GOAL #5   Title Patient will be modified independent in walking on even/uneven surface with least restrictive assistive device, for 20+ minutes without rest break, reporting some difficulty or less to improve walking tolerance with community ambulation including grocery shopping, going to church,etc.    Baseline 5/2: at home can do 10 minutes with breaks 5/23: limited by pain in hip    Time 5    Period Weeks    Status On-going    Target Date 01/01/21  Plan - 12/21/20 1346    Clinical Impression Statement Patient is able to perform interventions with minimal pain increase after rolling today. He is  challenged with prolonged standing with increased trunk flexion as time progresses due to fatigue of postural muscles and pain in hip. Fine motor skills are improving with decreased tremors of hands with interventions.  Patient needs cuing to swing UE's reciprocally with weight shift exercise and with backward stepping and correct UE positioning.    Personal Factors and Comorbidities Age;Comorbidity 3+;Past/Current Experience;Fitness;Time since onset of injury/illness/exacerbation    Comorbidities L1 vertebral compression fx, AAA, anemia, essential tremor.    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 Evolving/Moderate complexity    Rehab Potential Fair    PT Frequency 4x / week    PT Duration Other (comment)   5 weeks   PT Treatment/Interventions ADLs/Self Care Home Management;Cryotherapy;Electrical Stimulation;Iontophoresis 22m/ml Dexamethasone;Moist Heat;Traction;DME Instruction;Gait training;Stair training;Functional mobility training;Therapeutic activities;Therapeutic exercise;Balance training;Neuromuscular re-education;Manual techniques;Patient/family education;Passive range of motion;Dry needling;Vestibular;Canalith Repostioning;Taping;Visual/perceptual remediation/compensation;Energy conservation    PT Next Visit Plan LSVT program    PT Home Exercise Plan next session    Consulted and Agree with Plan of Care Patient;Family member/caregiver    Family Member Consulted wife           Patient will benefit from skilled therapeutic intervention in order to improve the following deficits and impairments:  Abnormal gait,Cardiopulmonary status limiting  activity,Decreased activity tolerance,Decreased balance,Decreased knowledge of precautions,Decreased endurance,Decreased coordination,Decreased knowledge of use of DME,Decreased mobility,Decreased safety awareness,Difficulty walking,Decreased strength,Impaired flexibility,Impaired perceived functional ability,Impaired tone,Impaired UE functional use,Postural dysfunction,Improper body mechanics,Pain  Visit Diagnosis: Other abnormalities of gait and mobility  Parkinson's disease ([place]  Unsteadiness on feet     Problem List Patient Active Problem List   Diagnosis Date Noted  . Macular hole, right eye 03/18/2017   MJanna Arch PT, DPT   12/21/2020, 1:47 PM  [place] [facility] [address] [place] [address]Phone: [phone]  Fax:  [phone] Name: Nicolas MABUSMRN: [id number] of Birth: 508/14/1952

## 2020-12-26 ENCOUNTER — Ambulatory Visit: Payer: Medicare Other

## 2020-12-26 ENCOUNTER — Other Ambulatory Visit: Payer: Self-pay

## 2020-12-26 ENCOUNTER — Ambulatory Visit: Payer: Medicare Other | Admitting: Speech Pathology

## 2020-12-26 DIAGNOSIS — R2689 Other abnormalities of gait and mobility: Secondary | ICD-10-CM

## 2020-12-26 DIAGNOSIS — G20 Parkinson's disease: Secondary | ICD-10-CM

## 2020-12-26 DIAGNOSIS — R471 Dysarthria and anarthria: Secondary | ICD-10-CM

## 2020-12-26 DIAGNOSIS — R2681 Unsteadiness on feet: Secondary | ICD-10-CM

## 2020-12-26 NOTE — Therapy (Signed)
[place] MAIN [facility] SERVICES [address] [place], [place], [address] Phone: [phone]   Fax:  [phone]  Physical Therapy Treatment  Patient Details  Name: Nicolas Solis MRN: [id number] Date of Birth: [birth year] Referring Provider (PT): Jennings Books   Encounter Date: 12/26/2020   PT End of Session - 12/26/20 1138    Visit Number 14    Number of Visits 17    Date for PT Re-Evaluation 01/01/21    Authorization Type 4/10 PN 5/23    PT Start Time 1101    PT Stop Time 1159    PT Time Calculation (min) 58 min    Equipment Utilized During Treatment Gait belt    Activity Tolerance Patient tolerated treatment well    Behavior During Therapy WFL for tasks assessed/performed           Past Medical History:  Diagnosis Date  . Compression fracture of first cervical vertebra (HCC)   . Essential tremor   . Ileus [facility])     Past Surgical History:  Procedure Laterality Date  . [place] VITRECTOMY WITH 20 GAUGE MVR PORT Right 03/18/2017  . 25 GAUGE PARS PLANA VITRECTOMY WITH 20 GAUGE MVR PORT FOR MACULAR HOLE Right 03/18/2017   Procedure: 25 GAUGE PARS PLANA VITRECTOMY WITH 20 GAUGE MVR PORT FOR MACULAR HOLE; PHOTOCOAGULATION RIGHT EYE;  Surgeon: Hayden Pedro, MD;  Location: [place];  Service: Ophthalmology;  Laterality: Right;  . APPENDECTOMY    . COLONOSCOPY W/ POLYPECTOMY    . GAS INSERTION Right 03/18/2017   Procedure: INSERTION OF GAS RIGHT (C3F8);  Surgeon: Hayden Pedro, MD;  Location: [place];  Service: Ophthalmology;  Laterality: Right;  . GAS/FLUID EXCHANGE Right 03/18/2017   Procedure: GAS/FLUID EXCHANGE RIGHT EYE;  Surgeon: Hayden Pedro, MD;  Location: [place];  Service: Ophthalmology;  Laterality: Right;  . JOINT REPLACEMENT Right    Hip  . MEMBRANE PEEL Right 03/18/2017   Procedure: MEMBRANE PEEL RIGHT EYE;  Surgeon: Hayden Pedro, MD;  Location: [place];  Service: Ophthalmology;  Laterality: Right;  . MOUTH  SURGERY    . PHOTOCOAGULATION Left 03/18/2017   Procedure: PHOTOCOAGULATION LEFT EYE;  Surgeon: Hayden Pedro, MD;  Location: [place];  Service: Ophthalmology;  Laterality: Left;  . SERUM PATCH Right 03/18/2017   Procedure: SERUM PATCH RIGHT EYE;  Surgeon: Hayden Pedro, MD;  Location: [place];  Service: Ophthalmology;  Laterality: Right;    There were no vitals filed for this visit.   Subjective Assessment - 12/26/20 1137    Subjective Patient reports compliance with HEP. No falls or LOB since last session. [place] steady last night. Is having continued R hip pain with walking but not at rest.    Patient is accompained by: Family member    Pertinent History Patient is a 70 year old male who presents for LSVT BIG and LOUD evaluation. Patient recently diagnosed with Parkinson's disease (05/2020) and is Sinemet. He has increased urinary frequency and nocturia as well as low back pain with ambulation. Cogwheeling with his R more than L is noted per neurologist. Was doing [employer] in [place].  Has been using a cane since 2017 and now is on a walker. PMH includes L1 vertebral compression fx, AAA, anemia, essential tremor.    Limitations Lifting;Standing;Walking;House hold activities;Other (comment)    How long can you sit comfortably? R hip pain    How long can you  stand comfortably? haven't tried: needs to hold on 2 minutes    How long can you walk comfortably? 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    Patient Stated Goals to be able to walk better    Currently in Pain? No/denies                    Treatment:   Patient seen for LSVT Daily Session Adapted Daily Exercises for facilitation/coordination of movement Sustained movements are designed to rescale the amplitude of movement output for generalization to daily functional activities  .Performed as follows for 1 set of 10 repetitions each multidirectional sustained movements  1) Floor to ceiling , cues to hold for 10 seconds, needs modeling for correct positions , VC to reach out further and to reach up to the ceiling 2) Side to side multidirectional Repetitive movements performed in sitting and are designed to provide retraining effort needed for sustained muscle activation in tasks , cues to lean fwd and hold position x 10 counts: Patient does not move forward to side sitting to forward smoothly and is not able to transition or stretch out leg very far 3) Step and reach forward , cues for good knee flex, cues to move UE and LE together, cues to rotate  arms up to pronate forearms while holding onto chair 4) Step and reach sideways, cues to turn  head sideways, large step sideways and turn head back to neutral, and to rotate arms and pronate forearms while holding onto chair with opposite hand.  5) Step and reach backwards, cues for SUE back, getting toe up and bending back knee while holding onto chair and then swinging arm forward and stomping  6) Rock and reach forward/backward, cues to reach far fwd with UE, patient not comfortable with feet apart , not rocking very far to get a good weight shift, not able to raise  heel or raise  toes much with SUE on chair.  7) Rock and reach sideways, cues for twist and look behind , only rotates sideways, unable to twist around or turn to look behind with one hand on chair   functional component task with supervision 5 reps and simulated activities for: 1. Sit to stand x 5   needs cues for nose over toes and explosive movement.  2. signing name 3.put pant leg on/off 4.tall posture walking 5. Large steps walking:    Specific task training:  sign name 5x on paper with standard size pen pull leg onto knee 5x each side to increase flexibility bilaterally Slide shoe on/off 5x each LE Roller to R IT band 2 minutes  Grooved peg board 1x L  hand 1x R hand, very challenging for patient.  Stand with water bottle, weave between 4 cones and sit, return back weaving through 4 cones. x3 trials       Long ambulation for 460 ft with walking sticks, cueing for large steps and upright posture; one seated rest break. Ambulate negotiating turns, people, with one seated rest break.      Pt educated throughout session about proper posture and technique with exercises. Improved exercise technique, movement at target joints, use of target muscles after min to mod verbal, visual, tactile cues.        Patient is able to perform daily interventions with minimal cueing this session. Hip pain continues to be a  limiting factor for prolonged ambulation rather than fatigue. Patient's fine motor skills are improving with decreased dropping of items. Increased hip movement is allowing for increased ease of crossing limbs for donning/doffing ADLs. Patient will benefit from LSVT program to increase functional mobility, improve ADL performance, and improve quality of life                  PT Education - 12/26/20 1138    Education Details exercise technique, body mechanics    Person(s) Educated Patient    Methods Explanation;Demonstration;Tactile cues;Verbal cues    Comprehension Verbalized understanding;Returned demonstration;Verbal cues required;Tactile cues required            PT Short Term Goals - 12/18/20 1152      PT SHORT TERM GOAL #1   Title Patient will be independent in home exercise program to improve strength/mobility for better functional independence with ADLs.    Baseline 5/23: intmittent compliance    Time 2    Period Weeks    Status Partially Met    Target Date 12/11/20      PT SHORT TERM GOAL #2   Title Patient will be able to stand 5 x from standard height chair without UE support to increase functional mobility    Baseline 5/2: requires use of BUE support 5/23: able to perform    Time 2    Period Weeks     Status Achieved    Target Date 12/11/20             PT Long Term Goals - 12/18/20 1152      PT LONG TERM GOAL #1   Title Patient will increase FOTO score to equal to or greater than  53%   to demonstrate statistically significant improvement in mobility and quality of life.    Baseline 5/2: 40% 5/23: 50%    Time 5    Period Weeks    Status Partially Met    Target Date 01/01/21      PT LONG TERM GOAL #2   Title Patient (> 12 years old) will complete five times sit to stand test in < 15 seconds without UE support indicating an increased LE strength and improved balance.    Baseline 5/2: unable to perform w/o UE support 15.35 seconds with heavy BUE support 5/23: 11.44 seconds w/o UE support    Time 5    Period Weeks    Status Partially Met    Target Date 01/01/21      PT LONG TERM GOAL #3   Title Patient will increase Berg Balance score by > 6 points (35/56)  to demonstrate decreased fall risk during functional activities.    Baseline 5/2: 29/56 5/23: 39/56    Time 4    Period Weeks    Status Achieved      PT LONG TERM GOAL #4   Title Patient will reduce timed up and go to <11 seconds to reduce fall risk and demonstrate improved transfer/gait ability.    Baseline 5/2: 14.21 w AD 5/23: 14.01 seconds w/o AD    Time 5    Period Weeks    Status Partially Met    Target Date 01/01/21      PT LONG TERM GOAL #5   Title Patient will be modified independent in walking on even/uneven surface with least restrictive assistive device, for 20+ minutes without rest break, reporting some difficulty or less to improve walking tolerance with community ambulation including grocery shopping, going to church,etc.  Baseline 5/2: at home can do 10 minutes with breaks 5/23: limited by pain in hip    Time 5    Period Weeks    Status On-going    Target Date 01/01/21                 Plan - 12/26/20 1230    Clinical Impression Statement Patient is able to perform daily interventions with  minimal cueing this session. Hip pain continues to be a limiting factor for prolonged ambulation rather than fatigue. Patient's fine motor skills are improving with decreased dropping of items. Increased hip movement is allowing for increased ease of crossing limbs for donning/doffing ADLs. Patient will benefit from LSVT program to increase functional mobility, improve ADL performance, and improve quality of life    Personal Factors and Comorbidities Age;Comorbidity 3+;Past/Current Experience;Fitness;Time since onset of injury/illness/exacerbation    Comorbidities L1 vertebral compression fx, AAA, anemia, essential tremor.    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 Evolving/Moderate complexity    Rehab Potential Fair    PT Frequency 4x / week    PT Duration Other (comment)   5 weeks   PT Treatment/Interventions ADLs/Self Care Home Management;Cryotherapy;Electrical Stimulation;Iontophoresis 85m/ml Dexamethasone;Moist Heat;Traction;DME Instruction;Gait training;Stair training;Functional mobility training;Therapeutic activities;Therapeutic exercise;Balance training;Neuromuscular re-education;Manual techniques;Patient/family education;Passive range of motion;Dry needling;Vestibular;Canalith Repostioning;Taping;Visual/perceptual remediation/compensation;Energy conservation    PT Next Visit Plan LSVT program    PT Home Exercise Plan next session    Consulted and Agree with Plan of Care Patient;Family member/caregiver    Family Member Consulted wife           Patient will benefit from skilled therapeutic intervention in order to improve the following deficits and  impairments:  Abnormal gait,Cardiopulmonary status limiting activity,Decreased activity tolerance,Decreased balance,Decreased knowledge of precautions,Decreased endurance,Decreased coordination,Decreased knowledge of use of DME,Decreased mobility,Decreased safety awareness,Difficulty walking,Decreased strength,Impaired flexibility,Impaired perceived functional ability,Impaired tone,Impaired UE functional use,Postural dysfunction,Improper body mechanics,Pain  Visit Diagnosis: Other abnormalities of gait and mobility  Parkinson's disease (HFortescue  Unsteadiness on feet     Problem List Patient Active Problem List   Diagnosis Date Noted  . Macular hole, right eye 03/18/2017   MJanna Arch PT, DPT   12/26/2020, 12:32 PM  [place] [facility] [place] [address] [address]Phone: [phone]  Fax:  [phone] Name: Nicolas BROSHMRN: [id number] of Birth: 508/11/1950

## 2020-12-26 NOTE — Therapy (Signed)
[facility] MAIN [facility] SERVICES [address] [place], [place], [address] Phone: [phone]   Fax:  [phone]  Speech Language Pathology Treatment  Patient Details  Name: Nicolas Solis MRN: [id number] Date of Birth: 06-17-1951 Referring Provider (SLP): Dr. Sherryll Burger   Encounter Date: 12/26/2020   End of Session - 12/26/20 1303    Visit Number 13    Number of Visits 17    Date for SLP Re-Evaluation 02/25/21    Authorization Type MCR    Progress Note Due on Visit 10    SLP Start Time 1100    SLP Stop Time  1200    SLP Time Calculation (min) 60 min    Activity Tolerance Patient tolerated treatment well           Past Medical History:  Diagnosis Date  . Compression fracture of first cervical vertebra (HCC)   . Essential tremor   . Ileus [facility])     Past Surgical History:  Procedure Laterality Date  . 25 GAUGE PARS PLANA VITRECTOMY WITH 20 GAUGE MVR PORT Right 03/18/2017  . 25 GAUGE PARS PLANA VITRECTOMY WITH 20 GAUGE MVR PORT FOR MACULAR HOLE Right 03/18/2017   Procedure: 25 GAUGE PARS PLANA VITRECTOMY WITH 20 GAUGE MVR PORT FOR MACULAR HOLE; PHOTOCOAGULATION RIGHT EYE;  Surgeon: Sherrie George, MD;  Location: [facility] OR;  Service: Ophthalmology;  Laterality: Right;  . APPENDECTOMY    . COLONOSCOPY W/ POLYPECTOMY    . GAS INSERTION Right 03/18/2017   Procedure: INSERTION OF GAS RIGHT (C3F8);  Surgeon: Sherrie George, MD;  Location: [facility] OR;  Service: Ophthalmology;  Laterality: Right;  . GAS/FLUID EXCHANGE Right 03/18/2017   Procedure: GAS/FLUID EXCHANGE RIGHT EYE;  Surgeon: Sherrie George, MD;  Location: [facility] OR;  Service: Ophthalmology;  Laterality: Right;  . JOINT REPLACEMENT Right    Hip  . MEMBRANE PEEL Right 03/18/2017   Procedure: MEMBRANE PEEL RIGHT EYE;  Surgeon: Sherrie George, MD;  [facility] OR;  Service: Ophthalmology;  Laterality: Right;  . MOUTH SURGERY    . PHOTOCOAGULATION Left 03/18/2017   Procedure: PHOTOCOAGULATION  LEFT EYE;  Surgeon: Sherrie George, MD;  Location: [facility] OR;  Service: Ophthalmology;  Laterality: Left;  . SERUM PATCH Right 03/18/2017   Procedure: SERUM PATCH RIGHT EYE;  Surgeon: Sherrie George, MD;  Location: [facility] [place] [facility] OR;  Service: Ophthalmology;  Laterality: Right;    There were no vitals filed for this visit.   Subjective Assessment - 12/26/20 1237    Subjective "I used my loud voice with my wife."    Currently in Pain? No/denies                 ADULT SLP TREATMENT - 12/26/20 0001      General Information   Behavior/Cognition Alert;Cooperative    HPI Nicolas Solis is a 70 y.o. male referred by Dr. Sherryll Burger for LSVT-LOUD therapy due to decreased volume of speech secondary to Parkinson's disease, diagnosed November 2021.      Cognitive-Linquistic Treatment   Treatment focused on Dysarthria;Patient/family/caregiver education    Skilled Treatment LSVT Daily tasks with minimal cues for duration. Loud /a/ averaged 85 dB, duration 4.6 seconds. Patient used effort level 8/10 for loud /a/ task. Max F0 for high pitch glides was 293 Hz (87 dB avg). Min F0 for low glides was 147 Hz (83 dB avg). Functional phrases averaged 86 dB with modified independence. Patient engaged in short conversational period ~10-12 minutes in duration, averaging  80 dB. Patient completed multi-paragraph reading task that was longer in length. Average dB of reading task was 83 dB, with minimal cues for loudness. Patient was able to carryover loudness during spontaneous responses to "off the cuff" questions averaging 80 dB, with minimal cues for loudness.      Assessment / Recommendations / Plan   Plan Continue with current plan of care      Progression Toward Goals   Progression toward goals Progressing toward goals            SLP Education - 12/26/20 1259    Education Details Educated patient to use loud voice when thinking out loud    Person(s) Educated Patient    Methods Demonstration;Verbal cues     Comprehension Returned demonstration;Need further instruction;Verbalized understanding            SLP Short Term Goals - 12/19/20 1156      SLP SHORT TERM GOAL #1   Title The patient will complete Daily Tasks (Maximum duration "ah", High/Lows, and Functional Phrases) at average loudness >/= 80 dB and with loud, good quality voice with min cues.    Time 10    Period --   sessions   Status Achieved      SLP SHORT TERM GOAL #2   Title The patient will complete Hierarchal Speech Loudness reading drills (words/phrases, sentences) at average >/= 75 dB and with loud, good quality voice with min cues.    Time 10    Period --   sessions   Status Achieved      SLP SHORT TERM GOAL #3   Title The patient will participate in 5-8 minutes conversation, maintaining average loudness of 75 dB and loud, good quality voice with min cues.    Time 10    Period --   sessions   Status Achieved            SLP Long Term Goals - 12/19/20 1157      SLP LONG TERM GOAL #1   Title The patient will complete Daily Tasks (Maximum duration "ah", High/Lows, and Functional Phrases) at average loudness >/= 80 dB and with loud, good quality voice.    Time 4   or 17 sessions, for all LTGs   Period Weeks    Status On-going      SLP LONG TERM GOAL #2   Title The patient will complete Hierarchal Speech Loudness reading drills (words/phrases, sentences, and paragraph) at average >/= 75 dB and with loud, good quality voice.    Time 4    Period Weeks    Status On-going      SLP LONG TERM GOAL #3   Title The patient will participate in 15-20 minutes conversation, maintaining average loudness of 75 dB and loud, good quality voice.    Time 4    Period Weeks    Status On-going      SLP LONG TERM GOAL #4   Title Patient will report improved communication effectiveness as measured by Communicative Effectiveness Survey.    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 Patient is responding well to intensity-based cues, and is improving ability to  maintain vocal intensity and quality in structured tasks, with intermittent carryover to conversation. Patient benefitted from demonstration/modeling of pitch glides to avoid straining. Continue skilled ST to maximize carryover of loudness and vocal quality, improve functional communication and intelligibility for conversations with family, friends and in the community.    Speech Therapy Frequency 4x / week    Duration 4 weeks    Treatment/Interventions Cueing hierarchy;SLP instruction and feedback;Functional tasks;Patient/family education    Potential to Achieve Goals Good    SLP Home Exercise Plan provided, see instruction section    Consulted and Agree with Plan of Care Patient           Patient will benefit from skilled therapeutic intervention in order to improve the following deficits and impairments:   Dysarthria and anarthria  Parkinson's disease [facility])    Problem List Patient Active Problem List   Diagnosis Date Noted  . Macular hole, right eye 03/18/2017   Essie Hart, SLP Graduate Student  Essie Hart 12/26/2020, 1:13 PM   [facility] MAIN [facility] SERVICES [address] [place], [place], [address] Phone: [phone]   Fax:  [phone]   Name: Nicolas Solis MRN: [id number] Date of Birth: December 31, 1950

## 2020-12-27 ENCOUNTER — Ambulatory Visit: Payer: Medicare Other | Attending: Neurology | Admitting: Speech Pathology

## 2020-12-27 ENCOUNTER — Ambulatory Visit: Payer: Medicare Other

## 2020-12-27 DIAGNOSIS — R2681 Unsteadiness on feet: Secondary | ICD-10-CM | POA: Insufficient documentation

## 2020-12-27 DIAGNOSIS — R2689 Other abnormalities of gait and mobility: Secondary | ICD-10-CM | POA: Insufficient documentation

## 2020-12-27 DIAGNOSIS — R471 Dysarthria and anarthria: Secondary | ICD-10-CM | POA: Insufficient documentation

## 2020-12-27 DIAGNOSIS — R279 Unspecified lack of coordination: Secondary | ICD-10-CM | POA: Insufficient documentation

## 2020-12-27 DIAGNOSIS — G20 Parkinson's disease: Secondary | ICD-10-CM | POA: Insufficient documentation

## 2020-12-27 DIAGNOSIS — G20A1 Parkinson's disease without dyskinesia, without mention of fluctuations: Secondary | ICD-10-CM

## 2020-12-27 NOTE — Therapy (Signed)
[place] MAIN [facility] SERVICES [address] [place], [place], [address] Phone: [phone]   Fax:  [phone]  Physical Therapy Treatment  Patient Details  Name: Nicolas Solis MRN: [id number] Date of Birth: May 07, 1951 Referring Provider (PT): Jennings Books   Encounter Date: 12/27/2020   PT End of Session - 12/27/20 1536    Visit Number 15    Number of Visits 17    Date for PT Re-Evaluation 01/01/21    Authorization Type 5/10 PN 5/23    PT Start Time 1100    PT Stop Time 1147    PT Time Calculation (min) 47 min    Equipment Utilized During Treatment Gait belt    Activity Tolerance Patient tolerated treatment well    Behavior During Therapy WFL for tasks assessed/performed           Past Medical History:  Diagnosis Date  . Compression fracture of first cervical vertebra (HCC)   . Essential tremor   . Ileus [facility])     Past Surgical History:  Procedure Laterality Date  . [place] VITRECTOMY WITH 20 GAUGE MVR PORT Right 03/18/2017  . 25 GAUGE PARS PLANA VITRECTOMY WITH 20 GAUGE MVR PORT FOR MACULAR HOLE Right 03/18/2017   Procedure: 25 GAUGE PARS PLANA VITRECTOMY WITH 20 GAUGE MVR PORT FOR MACULAR HOLE; PHOTOCOAGULATION RIGHT EYE;  Surgeon: Hayden Pedro, MD;  Location: [place];  Service: Ophthalmology;  Laterality: Right;  . APPENDECTOMY    . COLONOSCOPY W/ POLYPECTOMY    . GAS INSERTION Right 03/18/2017   Procedure: INSERTION OF GAS RIGHT (C3F8);  Surgeon: Hayden Pedro, MD;  Location: [place];  Service: Ophthalmology;  Laterality: Right;  . GAS/FLUID EXCHANGE Right 03/18/2017   Procedure: GAS/FLUID EXCHANGE RIGHT EYE;  Surgeon: Hayden Pedro, MD;  Location: [place];  Service: Ophthalmology;  Laterality: Right;  . JOINT REPLACEMENT Right    Hip  . MEMBRANE PEEL Right 03/18/2017   Procedure: MEMBRANE PEEL RIGHT EYE;  Surgeon: Hayden Pedro, MD;  Location: [place];  Service: Ophthalmology;  Laterality: Right;  . MOUTH  SURGERY    . PHOTOCOAGULATION Left 03/18/2017   Procedure: PHOTOCOAGULATION LEFT EYE;  Surgeon: Hayden Pedro, MD;  Location: [place];  Service: Ophthalmology;  Laterality: Left;  . SERUM PATCH Right 03/18/2017   Procedure: SERUM PATCH RIGHT EYE;  Surgeon: Hayden Pedro, MD;  Location: [place];  Service: Ophthalmology;  Laterality: Right;    There were no vitals filed for this visit.   Subjective Assessment - 12/27/20 1144    Subjective Patient was able to do his daily exercises without his sheet yesterday. No falls or LOB since last session. Has rock steady tonight.    Patient is accompained by: Family member    Pertinent History Patient is a 70 year old male who presents for LSVT BIG and LOUD evaluation. Patient recently diagnosed with Parkinson's disease (05/2020) and is Sinemet. He has increased urinary frequency and nocturia as well as low back pain with ambulation. Cogwheeling with his R more than L is noted per neurologist. Was doing [employer] in [place].  Has been using a cane since 2017 and now is on a walker. PMH includes L1 vertebral compression fx, AAA, anemia, essential tremor.    Limitations Lifting;Standing;Walking;House hold activities;Other (comment)    How long can you sit comfortably? R hip pain    How long can you stand comfortably? haven't tried: needs to hold  on 2 minutes    How long can you walk comfortably? 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    Patient Stated Goals to be able to walk better    Currently in Pain? No/denies                  Treatment:   Patient seen for LSVT Daily Session Adapted Daily Exercises for facilitation/coordination of movement Sustained movements are designed to rescale the amplitude of movement output for generalization to daily functional activities .Performed as follows for 1 set of 10  repetitions each multidirectional sustained movements  1) Floor to ceiling , cues to hold for 10 seconds, needs modeling for correct positions , VC to reach out further and to reach up to the ceiling 2) Side to side multidirectional Repetitive movements performed in sitting and are designed to provide retraining effort needed for sustained muscle activation in tasks , cues to lean fwd and hold position x 10 counts: Patient does not move forward to side sitting to forward smoothly and is not able to transition or stretch out leg very far 3) Step and reach forward , cues for good knee flex, cues to move UE and LE together, cues to rotate  arms up to pronate forearms while holding onto chair 4) Step and reach sideways, cues to turn  head sideways, large step sideways and turn head back to neutral, and to rotate arms and pronate forearms while holding onto chair with opposite hand.  5) Step and reach backwards, cues for SUE back, getting toe up and bending back knee while holding onto chair and then swinging arm forward and stomping  6) Rock and reach forward/backward, cues to reach far fwd with UE, patient not comfortable with feet apart , not rocking very far to get a good weight shift, not able to raise  heel or raise  toes much with SUE on chair.  7) Rock and reach sideways, cues for twist and look behind , only rotates sideways, unable to twist around or turn to look behind with one hand on chair   functional component task with supervision 5 reps and simulated activities for: 1. Sit to stand x 5   needs cues for nose over toes and explosive movement.  2. signing name 3.put pant leg on/off 4.tall posture walking 5. Large steps walking:    Specific task training:  sign name 5x on paper with standard size pen pull leg onto knee 5x each side to increase flexibility bilaterally Slide shoe on/off 5x each LE Roller to R IT band 2 minutes  Pull pant leg up/down 5x/ each LE Sign alphabet with equal  letter height         Long ambulation for 460 ft with walking sticks, cueing for large steps and upright posture; one seated rest break. Ambulate negotiating turns, people, with one seated rest break.      Pt educated throughout session about proper posture and technique with exercises. Improved exercise technique, movement at target joints, use of target muscles after min to mod verbal, visual, tactile cues.    Patient has to leave a little early due to appointment. He remains highly motivated throughout session and is encouraged to follow up with right hip pain to physician due to limitations with ambulation. Decreased cueing requiring for large stepping however trunk flexion continues to be area of progression.  Fine motor skills and handwriting are improving. Patient will benefit from LSVT program to increase functional mobility, improve ADL performance, and improve quality of life                  PT Education - 12/27/20 1145    Education Details exercise technique, body mechanics, LSVT    Person(s) Educated Patient    Methods Explanation;Demonstration;Tactile cues;Verbal cues    Comprehension Verbalized understanding;Returned demonstration;Verbal cues required;Tactile cues required            PT Short Term Goals - 12/18/20 1152      PT SHORT TERM GOAL #1   Title Patient will be independent in home exercise program to improve strength/mobility for better functional independence with ADLs.    Baseline 5/23: intmittent compliance    Time 2    Period Weeks    Status Partially Met    Target Date 12/11/20      PT SHORT TERM GOAL #2   Title Patient will be able to stand 5 x from standard height chair without UE support to increase functional mobility    Baseline 5/2: requires use of BUE support 5/23: able to perform    Time 2    Period Weeks    Status Achieved    Target Date 12/11/20             PT Long Term Goals - 12/18/20 1152      PT LONG TERM GOAL #1    Title Patient will increase FOTO score to equal to or greater than  53%   to demonstrate statistically significant improvement in mobility and quality of life.    Baseline 5/2: 40% 5/23: 50%    Time 5    Period Weeks    Status Partially Met    Target Date 01/01/21      PT LONG TERM GOAL #2   Title Patient (> 73 years old) will complete five times sit to stand test in < 15 seconds without UE support indicating an increased LE strength and improved balance.    Baseline 5/2: unable to perform w/o UE support 15.35 seconds with heavy BUE support 5/23: 11.44 seconds w/o UE support    Time 5    Period Weeks    Status Partially Met    Target Date 01/01/21      PT LONG TERM GOAL #3   Title Patient will increase Berg Balance score by > 6 points (35/56)  to demonstrate decreased fall risk during functional activities.    Baseline 5/2: 29/56 5/23: 39/56    Time 4    Period Weeks    Status Achieved      PT LONG TERM GOAL #4   Title Patient will reduce timed up and go to <11 seconds to reduce fall risk and demonstrate improved transfer/gait ability.    Baseline 5/2: 14.21 w AD 5/23: 14.01 seconds w/o AD    Time 5    Period Weeks    Status Partially Met    Target Date 01/01/21      PT LONG TERM GOAL #5   Title Patient will be modified independent in walking on even/uneven surface with least restrictive assistive device, for 20+ minutes without rest break, reporting some difficulty or less to improve walking tolerance with community ambulation including grocery shopping, going to church,etc.    Baseline 5/2: at home can do 10 minutes with breaks 5/23: limited by pain in hip    Time 5  Period Weeks    Status On-going    Target Date 01/01/21                 Plan - 12/27/20 1537    Clinical Impression Statement Patient has to leave a little early due to appointment. He remains highly motivated throughout session and is encouraged to follow up with right hip pain to physician due to  limitations with ambulation. Decreased cueing requiring for large stepping however trunk flexion continues to be area of progression. Fine motor skills and handwriting are improving. Patient will benefit from LSVT program to increase functional mobility, improve ADL performance, and improve quality of life    Personal Factors and Comorbidities Age;Comorbidity 3+;Past/Current Experience;Fitness;Time since onset of injury/illness/exacerbation    Comorbidities L1 vertebral compression fx, AAA, anemia, essential tremor.    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 Evolving/Moderate complexity    Rehab Potential Fair    PT Frequency 4x / week    PT Duration Other (comment)   5 weeks   PT Treatment/Interventions ADLs/Self Care Home Management;Cryotherapy;Electrical Stimulation;Iontophoresis 60m/ml Dexamethasone;Moist Heat;Traction;DME Instruction;Gait training;Stair training;Functional mobility training;Therapeutic activities;Therapeutic exercise;Balance training;Neuromuscular re-education;Manual techniques;Patient/family education;Passive range of motion;Dry needling;Vestibular;Canalith Repostioning;Taping;Visual/perceptual remediation/compensation;Energy conservation    PT Next Visit Plan LSVT program    PT Home Exercise Plan next session    Consulted and Agree with Plan of Care Patient;Family member/caregiver    Family Member Consulted wife           Patient will benefit from skilled therapeutic intervention in order to improve the following deficits and impairments:  Abnormal gait,Cardiopulmonary status limiting activity,Decreased activity tolerance,Decreased  balance,Decreased knowledge of precautions,Decreased endurance,Decreased coordination,Decreased knowledge of use of DME,Decreased mobility,Decreased safety awareness,Difficulty walking,Decreased strength,Impaired flexibility,Impaired perceived functional ability,Impaired tone,Impaired UE functional use,Postural dysfunction,Improper body mechanics,Pain  Visit Diagnosis: Other abnormalities of gait and mobility  Unsteadiness on feet  Unspecified lack of coordination  Parkinson's disease ([facility]     Problem List Patient Active Problem List   Diagnosis Date Noted  . Macular hole, right eye 03/18/2017   MJanna Arch PT, DPT   12/27/2020, 3:38 PM  [place] [facility] [address] [place] [address]Phone: [phone]  Fax:  [phone] Name: RAIZIK REHMRN: [id number] of Birth: [birth year]

## 2020-12-27 NOTE — Therapy (Signed)
[place] [facility] MAIN [facility] SERVICES [address] [place], [place], [address] Phone: [phone]   Fax:  [phone]  Speech Language Pathology Treatment  Patient Details  Name: Nicolas Solis MRN: [id number] Date of Birth: 25-Oct-1950 Referring Provider (SLP): Dr. Sherryll Burger   Encounter Date: 12/27/2020   End of Session - 12/27/20 1202    Visit Number 14    Number of Visits 17    Date for SLP Re-Evaluation 02/25/21    Authorization Type MCR    Progress Note Due on Visit 10    SLP Start Time 1100    SLP Stop Time  1200    SLP Time Calculation (min) 60 min    Activity Tolerance Patient tolerated treatment well           Past Medical History:  Diagnosis Date  . Compression fracture of first cervical vertebra (HCC)   . Essential tremor   . Ileus [facility])     Past Surgical History:  Procedure Laterality Date  . 25 GAUGE PARS PLANA VITRECTOMY WITH 20 GAUGE MVR PORT Right 03/18/2017  . 25 GAUGE PARS PLANA VITRECTOMY WITH 20 GAUGE MVR PORT FOR MACULAR HOLE Right 03/18/2017   Procedure: 25 GAUGE PARS PLANA VITRECTOMY WITH 20 GAUGE MVR PORT FOR MACULAR HOLE; PHOTOCOAGULATION RIGHT EYE;  Surgeon: Sherrie George, MD;  Location: [facility] OR;  Service: Ophthalmology;  Laterality: Right;  . APPENDECTOMY    . COLONOSCOPY W/ POLYPECTOMY    . GAS INSERTION Right 03/18/2017   Procedure: INSERTION OF GAS RIGHT (C3F8);  Surgeon: Sherrie George, MD;  Location: [facility] OR;  Service: Ophthalmology;  Laterality: Right;  . GAS/FLUID EXCHANGE Right 03/18/2017   Procedure: GAS/FLUID EXCHANGE RIGHT EYE;  Surgeon: Sherrie George, MD;  Location: [facility] OR;  Service: Ophthalmology;  Laterality: Right;  . JOINT REPLACEMENT Right    Hip  . MEMBRANE PEEL Right 03/18/2017   Procedure: MEMBRANE PEEL RIGHT EYE;  Surgeon: Sherrie George, MD;  Location: [facility] OR;  Service: Ophthalmology;  Laterality: Right;  . MOUTH SURGERY    . PHOTOCOAGULATION Left 03/18/2017   Procedure: PHOTOCOAGULATION  LEFT EYE;  Surgeon: Sherrie George, MD;  Location: [facility] OR;  Service: Ophthalmology;  Laterality: Left;  . SERUM PATCH Right 03/18/2017   Procedure: SERUM PATCH RIGHT EYE;  Surgeon: Sherrie George, MD;  Location: [facility] OR;  Service: Ophthalmology;  Laterality: Right;    There were no vitals filed for this visit.   Subjective Assessment - 12/27/20 1129    Subjective "I have a lunch date this afternoon."    Currently in Pain? Yes    Pain Score 3     Pain Location Hip    Pain Orientation Right    Pain Descriptors / Indicators Aching    Pain Type Chronic pain                 ADULT SLP TREATMENT - 12/27/20 0001      General Information   Behavior/Cognition Alert;Cooperative    HPI Nicolas Solis is a 70 y.o. male referred by Dr. Sherryll Burger for LSVT-LOUD therapy due to decreased volume of speech secondary to Parkinson's disease, diagnosed November 2021.      Cognitive-Linquistic Treatment   Treatment focused on Dysarthria;Patient/family/caregiver education    Skilled Treatment Patient entered treatment room with low voice intensity/loudness. LSVT Daily tasks with minimal cues for loudness and duration. Loud /a/ averaged 85 dB, duration 4.4 seconds. Patient used effort level 9/10 for loud /a/  task. Max F0 for high pitch glides was 261 Hz (87 dB avg). Min F0 for low glides was 137 Hz (84 dB avg). Functional phrases averaged 87 dB with modified independence. Patient engaged in short conversational periods lasting ~ 10 minutes in duration and was able to carryover loudness. Patient completed multi-paragraph reading task with an average of 81 dB, with minimal cues for loudness. Spontaneous responses to "off the cuff" questions averaged 81 dB, with minimal cues for loudness.      Assessment / Recommendations / Plan   Plan Continue with current plan of care      Progression Toward Goals   Progression toward goals Progressing toward goals            SLP Education - 12/27/20 1200     Education Details Told patient to use loud voice when ordering lunch this afternoon    Person(s) Educated Patient    Methods Explanation    Comprehension Verbalized understanding            SLP Short Term Goals - 12/19/20 1156      SLP SHORT TERM GOAL #1   Title The patient will complete Daily Tasks (Maximum duration "ah", High/Lows, and Functional Phrases) at average loudness >/= 80 dB and with loud, good quality voice with min cues.    Time 10    Period --   sessions   Status Achieved      SLP SHORT TERM GOAL #2   Title The patient will complete Hierarchal Speech Loudness reading drills (words/phrases, sentences) at average >/= 75 dB and with loud, good quality voice with min cues.    Time 10    Period --   sessions   Status Achieved      SLP SHORT TERM GOAL #3   Title The patient will participate in 5-8 minutes conversation, maintaining average loudness of 75 dB and loud, good quality voice with min cues.    Time 10    Period --   sessions   Status Achieved            SLP Long Term Goals - 12/19/20 1157      SLP LONG TERM GOAL #1   Title The patient will complete Daily Tasks (Maximum duration "ah", High/Lows, and Functional Phrases) at average loudness >/= 80 dB and with loud, good quality voice.    Time 4   or 17 sessions, for all LTGs   Period Weeks    Status On-going      SLP LONG TERM GOAL #2   Title The patient will complete Hierarchal Speech Loudness reading drills (words/phrases, sentences, and paragraph) at average >/= 75 dB and with loud, good quality voice.    Time 4    Period Weeks    Status On-going      SLP LONG TERM GOAL #3   Title The patient will participate in 15-20 minutes conversation, maintaining average loudness of 75 dB and loud, good quality voice.    Time 4    Period Weeks    Status On-going      SLP LONG TERM GOAL #4   Title Patient will report improved communication effectiveness as measured by Communicative Effectiveness Survey.     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 Patient is responding well to intensity-based cues, and is improving ability to maintain vocal intensity and quality in structured tasks, with intermittent carryover to conversation. SLP noted patient's low vocal intensity/loudness when patient entered treatment room. SLP emphasized to patient the importance of carryover. Continue skilled ST to maximize carryover of loudness and vocal quality, improve functional communication and intelligibility for conversations with family, friends and in the community.    Speech Therapy Frequency 4x / week    Duration 4 weeks    Treatment/Interventions Cueing hierarchy;SLP instruction and feedback;Functional tasks;Patient/family education    Potential to Achieve Goals Good    SLP Home Exercise Plan provided, see instruction section    Consulted and Agree with Plan of Care Patient           Patient will benefit from skilled therapeutic intervention in order to improve the following deficits and impairments:   Dysarthria and anarthria  Parkinson's disease [facility])    Problem List Patient Active Problem List   Diagnosis Date Noted  . Macular hole, right eye 03/18/2017   Essie Hart, SLP Graduate Clinician  Essie Hart 12/27/2020, 12:06 PM  [place] MAIN [facility] SERVICES [address] [place], [place], [address] Phone: [phone]   Fax:  [phone]   Name: Nicolas Solis MRN: [id number] Date of Birth: Jan 06, 1951

## 2020-12-28 ENCOUNTER — Other Ambulatory Visit: Payer: Self-pay

## 2020-12-28 ENCOUNTER — Ambulatory Visit: Payer: Medicare Other

## 2020-12-28 ENCOUNTER — Ambulatory Visit: Payer: Medicare Other | Admitting: Speech Pathology

## 2020-12-28 DIAGNOSIS — R279 Unspecified lack of coordination: Secondary | ICD-10-CM

## 2020-12-28 DIAGNOSIS — G20 Parkinson's disease: Secondary | ICD-10-CM

## 2020-12-28 DIAGNOSIS — R2689 Other abnormalities of gait and mobility: Secondary | ICD-10-CM

## 2020-12-28 DIAGNOSIS — R2681 Unsteadiness on feet: Secondary | ICD-10-CM

## 2020-12-28 DIAGNOSIS — R471 Dysarthria and anarthria: Secondary | ICD-10-CM

## 2020-12-28 NOTE — Therapy (Signed)
[facility] MAIN [facility] SERVICES [address] [place], [place], [address] Phone: [phone]   Fax:  [phone]  Speech Language Pathology Treatment  Patient Details  Name: Nicolas Solis MRN: [id number] Date of Birth: 12-23-1950 Referring Provider (SLP): Dr. Sherryll Burger   Encounter Date: 12/28/2020   End of Session - 12/28/20 1141    Visit Number 15    Number of Visits 17    Date for SLP Re-Evaluation 02/25/21    Authorization Type MCR    Progress Note Due on Visit 10    SLP Start Time 1100    SLP Stop Time  1200    SLP Time Calculation (min) 60 min    Activity Tolerance Patient tolerated treatment well           Past Medical History:  Diagnosis Date  . Compression fracture of first cervical vertebra (HCC)   . Essential tremor   . Ileus [facility])     Past Surgical History:  Procedure Laterality Date  . 25 GAUGE PARS PLANA VITRECTOMY WITH 20 GAUGE MVR PORT Right 03/18/2017  . 25 GAUGE PARS PLANA VITRECTOMY WITH 20 GAUGE MVR PORT FOR MACULAR HOLE Right 03/18/2017   Procedure: 25 GAUGE PARS PLANA VITRECTOMY WITH 20 GAUGE MVR PORT FOR MACULAR HOLE; PHOTOCOAGULATION RIGHT EYE;  Surgeon: Sherrie George, MD;  Location: [facility] OR;  Service: Ophthalmology;  Laterality: Right;  . APPENDECTOMY    . COLONOSCOPY W/ POLYPECTOMY    . GAS INSERTION Right 03/18/2017   Procedure: INSERTION OF GAS RIGHT (C3F8);  Surgeon: Sherrie George, MD;  Location: [facility] OR;  Service: Ophthalmology;  Laterality: Right;  . GAS/FLUID EXCHANGE Right 03/18/2017   Procedure: GAS/FLUID EXCHANGE RIGHT EYE;  Surgeon: Sherrie George, MD;  Location: [facility] OR;  Service: Ophthalmology;  Laterality: Right;  . JOINT REPLACEMENT Right    Hip  . MEMBRANE PEEL Right 03/18/2017   Procedure: MEMBRANE PEEL RIGHT EYE;  Surgeon: Sherrie George, MD;  Location: [facility] OR;  Service: Ophthalmology;  Laterality: Right;  . MOUTH SURGERY    . PHOTOCOAGULATION Left 03/18/2017   Procedure: PHOTOCOAGULATION  LEFT EYE;  Surgeon: Sherrie George, MD;  Location[facility] OR;  Service: Ophthalmology;  Laterality: Left;  . SERUM PATCH Right 03/18/2017   Procedure: SERUM PATCH RIGHT EYE;  Surgeon: Sherrie George, MD;  Location: [facility] OR;  Service: Ophthalmology;  Laterality: Right;    There were no vitals filed for this visit.   Subjective Assessment - 12/28/20 1117    Subjective "Who came up with these (speech) exercises?"    Currently in Pain? No/denies                 ADULT SLP TREATMENT - 12/28/20 0001      General Information   Behavior/Cognition Alert;Cooperative    HPI [name] is a 70 y.o. male referred by Dr. Sherryll Burger for LSVT-LOUD therapy due to decreased volume of speech secondary to Parkinson's disease, diagnosed November 2021.      Cognitive-Linquistic Treatment   Treatment focused on Dysarthria;Patient/family/caregiver education    Skilled Treatment LSVT Daily tasks with minimal cues for loudness and duration. Loud /a/ averaged 85 dB, duration 3.6 seconds. Patient used effort level 8/10 for loud /a/ task. Max F0 for high pitch glides was 291 Hz (85 dB avg). Min F0 for low glides was 139 Hz (84 dB avg). Functional phrases averaged 87 dB with modified independence. Patient engaged in short conversational periods lasting ~ 10 minutes  in duration averaging 80 dB, with minimal to moderate cues for loudness due to occasional dips in vocal intensity. Patient completed multi-paragraph reading task with an average of 81 dB, with minimal cues for loudness. Spontaneous responses to "off the cuff" questions averaged 80 dB, with minimal to moderate cues for loudness.      Assessment / Recommendations / Plan   Plan Continue with current plan of care      Progression Toward Goals   Progression toward goals Progressing toward goals            SLP Education - 12/28/20 1137    Education Details brief explanation on LSVT tasks creators/rationale    Person(s) Educated Patient    Methods  Explanation    Comprehension Verbalized understanding;Need further instruction            SLP Short Term Goals - 12/19/20 1156      SLP SHORT TERM GOAL #1   Title The patient will complete Daily Tasks (Maximum duration "ah", High/Lows, and Functional Phrases) at average loudness >/= 80 dB and with loud, good quality voice with min cues.    Time 10    Period --   sessions   Status Achieved      SLP SHORT TERM GOAL #2   Title The patient will complete Hierarchal Speech Loudness reading drills (words/phrases, sentences) at average >/= 75 dB and with loud, good quality voice with min cues.    Time 10    Period --   sessions   Status Achieved      SLP SHORT TERM GOAL #3   Title The patient will participate in 5-8 minutes conversation, maintaining average loudness of 75 dB and loud, good quality voice with min cues.    Time 10    Period --   sessions   Status Achieved            SLP Long Term Goals - 12/19/20 1157      SLP LONG TERM GOAL #1   Title The patient will complete Daily Tasks (Maximum duration "ah", High/Lows, and Functional Phrases) at average loudness >/= 80 dB and with loud, good quality voice.    Time 4   or 17 sessions, for all LTGs   Period Weeks    Status On-going      SLP LONG TERM GOAL #2   Title The patient will complete Hierarchal Speech Loudness reading drills (words/phrases, sentences, and paragraph) at average >/= 75 dB and with loud, good quality voice.    Time 4    Period Weeks    Status On-going      SLP LONG TERM GOAL #3   Title The patient will participate in 15-20 minutes conversation, maintaining average loudness of 75 dB and loud, good quality voice.    Time 4    Period Weeks    Status On-going      SLP LONG TERM GOAL #4   Title Patient will report improved communication effectiveness as measured by Communicative Effectiveness Survey.    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 SLP noted patient's increase in vocalintensity/loudness upon arival to treatment room. Patient is  responding well to intensity-based cues, and is improving ability to maintain vocal intensity and quality in structured tasks, with intermittent carryover to conversation. Continue skilled ST to maximize carryover of loudness and vocal quality, improve functional communication and intelligibility for conversations with family, friends and in the community.    Speech Therapy Frequency 4x / week    Duration 4 weeks    Treatment/Interventions Cueing hierarchy;SLP instruction and feedback;Functional tasks;Patient/family education    Potential to Achieve Goals Good    SLP Home Exercise Plan provided, see instruction section    Consulted and Agree with Plan of Care Patient           Patient will benefit from skilled therapeutic intervention in order to improve the following deficits and impairments:   Dysarthria and anarthria  Parkinson's disease [facility])    Problem List Patient Active Problem List   Diagnosis Date Noted  . Macular hole, right eye 03/18/2017   Essie Hart, SLP Graduate Clinician  Essie Hart 12/28/2020, 11:48 AM  [place] [facility] MAIN [facility] SERVICES [address] [place], [place], [address] Phone: [phone]   Fax:  [phone]   Name: TRUONG DELCASTILLO MRN: [id number] Date of Birth: [birth year]

## 2020-12-28 NOTE — Therapy (Signed)
[place] MAIN [facility] SERVICES [address] [place], [place], [address] Phone: [phone]   Fax:  [phone]  Physical Therapy Treatment  Patient Details  Name: Nicolas Solis MRN: [id number] Date of Birth: September 19, 1950 Referring Provider (PT): Jennings Books   Encounter Date: 12/28/2020   PT End of Session - 12/28/20 1225    Visit Number 16    Number of Visits 17    Date for PT Re-Evaluation 01/01/21    Authorization Type 6/10 PN 5/23    PT Start Time 1100    PT Stop Time 1157    PT Time Calculation (min) 57 min    Equipment Utilized During Treatment Gait belt    Activity Tolerance Patient tolerated treatment well    Behavior During Therapy WFL for tasks assessed/performed           Past Medical History:  Diagnosis Date  . Compression fracture of first cervical vertebra (HCC)   . Essential tremor   . Ileus [facility])     Past Surgical History:  Procedure Laterality Date  . [place] VITRECTOMY WITH 20 GAUGE MVR PORT Right 03/18/2017  . 25 GAUGE PARS PLANA VITRECTOMY WITH 20 GAUGE MVR PORT FOR MACULAR HOLE Right 03/18/2017   Procedure: 25 GAUGE PARS PLANA VITRECTOMY WITH 20 GAUGE MVR PORT FOR MACULAR HOLE; PHOTOCOAGULATION RIGHT EYE;  Surgeon: [place] Pedro, MD;  Location: [place];  Service: Ophthalmology;  Laterality: Right;  . APPENDECTOMY    . COLONOSCOPY W/ POLYPECTOMY    . GAS INSERTION Right 03/18/2017   Procedure: INSERTION OF GAS RIGHT (C3F8);  Surgeon: [place] Pedro, MD;  Location: [place];  Service: Ophthalmology;  Laterality: Right;  . GAS/FLUID EXCHANGE Right 03/18/2017   Procedure: GAS/FLUID EXCHANGE RIGHT EYE;  Surgeon: [place] Pedro, MD;  Location: [place];  Service: Ophthalmology;  Laterality: Right;  . JOINT REPLACEMENT Right    Hip  . MEMBRANE PEEL Right 03/18/2017   Procedure: MEMBRANE PEEL RIGHT EYE;  Surgeon: [place] Pedro, MD;  Location: [place];  Service: Ophthalmology;  Laterality: Right;  . MOUTH  SURGERY    . PHOTOCOAGULATION Left 03/18/2017   Procedure: PHOTOCOAGULATION LEFT EYE;  Surgeon: [place] Pedro, MD;  Location: [place];  Service: Ophthalmology;  Laterality: Left;  . SERUM PATCH Right 03/18/2017   Procedure: SERUM PATCH RIGHT EYE;  Surgeon: [place] Pedro, MD;  Location: [place];  Service: Ophthalmology;  Laterality: Right;    There were no vitals filed for this visit.   Subjective Assessment - 12/28/20 1222    Subjective Patient is aware that next session is his last session. Has been compliant with HEP.    Patient is accompained by: Family member    Pertinent History Patient is a 70 year old male who presents for LSVT BIG and LOUD evaluation. Patient recently diagnosed with Parkinson's disease (05/2020) and is Sinemet. He has increased urinary frequency and nocturia as well as low back pain with ambulation. Cogwheeling with his R more than L is noted per neurologist. Was doing [employer] in [place].  Has been using a cane since 2017 and now is on a walker. PMH includes L1 vertebral compression fx, AAA, anemia, essential tremor.    Limitations Lifting;Standing;Walking;House hold activities;Other (comment)    How long can you sit comfortably? R hip pain    How long can you stand comfortably? haven't tried: needs to hold on 2 minutes    How long  can you walk comfortably? 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    Patient Stated Goals to be able to walk better    Currently in Pain? No/denies                      Treatment:   Patient seen for LSVT Daily Session Adapted Daily Exercises for facilitation/coordination of movement Sustained movements are designed to rescale the amplitude of movement output for generalization to daily functional activities .Performed as follows for 1 set of 10 repetitions each multidirectional sustained  movements  1) Floor to ceiling , cues to hold for 10 seconds, needs modeling for correct positions , VC to reach out further and to reach up to the ceiling 2) Side to side multidirectional Repetitive movements performed in sitting and are designed to provide retraining effort needed for sustained muscle activation in tasks , cues to lean fwd and hold position x 10 counts: Patient does not move forward to side sitting to forward smoothly and is not able to transition or stretch out leg very far 3) Step and reach forward , cues for good knee flex, cues to move UE and LE together, cues to rotate  arms up to pronate forearms while holding onto chair 4) Step and reach sideways, cues to turn  head sideways, large step sideways and turn head back to neutral, and to rotate arms and pronate forearms while holding onto chair with opposite hand.  5) Step and reach backwards, cues for SUE back, getting toe up and bending back knee while holding onto chair and then swinging arm forward and stomping  6) Rock and reach forward/backward, cues to reach far fwd with UE, patient not comfortable with feet apart , not rocking very far to get a good weight shift, not able to raise  heel or raise  toes much with SUE on chair.  7) Rock and reach sideways, cues for twist and look behind , only rotates sideways, unable to twist around or turn to look behind with one hand on chair   functional component task with supervision 5 reps and simulated activities for: 1. Sit to stand x 5   needs cues for nose over toes and explosive movement.  2. signing name 3.put pant leg on/off 4.tall posture walking 5. Large steps walking:    Specific task training:  sign name 5x on paper with standard size pen pull leg onto knee 5x each side to increase flexibility bilaterally Slide shoe on/off 5x each LE Roller to R IT band 2 minutes  Sit to stand 15x grab ball and throw into hoop  Card game for coordination, dual task with cognition  challenge, holding multiple items in hand, upright posture with task x 7 minutes        Long ambulation for 480 ft with walking sticks, cueing for large steps and upright posture; one seated rest break. Ambulate negotiating turns, people, with one seated rest break.      Pt educated throughout session about proper posture and technique with exercises. Improved exercise technique, movement at target joints, use of target muscles after min to mod verbal, visual, tactile cues.        Patient is challenged with maintaining upright posture with prolonged ambulation.He is aware next session is last scheduled LSVT session. Education on need to follow up with physician about continued hip  pain due to implications with his mobility. Use of roller assists in pain for short duration. Fine motor skills are improving with patient's ability to handle cards.  Patient will benefit from LSVT program to increase functional mobility, improve ADL performance, and improve quality of life                  PT Education - 12/28/20 1224    Education Details exercise technique, LSVT    Person(s) Educated Patient    Methods Explanation;Demonstration;Tactile cues;Verbal cues    Comprehension Verbalized understanding;Returned demonstration;Verbal cues required;Tactile cues required            PT Short Term Goals - 12/18/20 1152      PT SHORT TERM GOAL #1   Title Patient will be independent in home exercise program to improve strength/mobility for better functional independence with ADLs.    Baseline 5/23: intmittent compliance    Time 2    Period Weeks    Status Partially Met    Target Date 12/11/20      PT SHORT TERM GOAL #2   Title Patient will be able to stand 5 x from standard height chair without UE support to increase functional mobility    Baseline 5/2: requires use of BUE support 5/23: able to perform    Time 2    Period Weeks    Status Achieved    Target Date 12/11/20              PT Long Term Goals - 12/18/20 1152      PT LONG TERM GOAL #1   Title Patient will increase FOTO score to equal to or greater than  53%   to demonstrate statistically significant improvement in mobility and quality of life.    Baseline 5/2: 40% 5/23: 50%    Time 5    Period Weeks    Status Partially Met    Target Date 01/01/21      PT LONG TERM GOAL #2   Title Patient (> 58 years old) will complete five times sit to stand test in < 15 seconds without UE support indicating an increased LE strength and improved balance.    Baseline 5/2: unable to perform w/o UE support 15.35 seconds with heavy BUE support 5/23: 11.44 seconds w/o UE support    Time 5    Period Weeks    Status Partially Met    Target Date 01/01/21      PT LONG TERM GOAL #3   Title Patient will increase Berg Balance score by > 6 points (35/56)  to demonstrate decreased fall risk during functional activities.    Baseline 5/2: 29/56 5/23: 39/56    Time 4    Period Weeks    Status Achieved      PT LONG TERM GOAL #4   Title Patient will reduce timed up and go to <11 seconds to reduce fall risk and demonstrate improved transfer/gait ability.    Baseline 5/2: 14.21 w AD 5/23: 14.01 seconds w/o AD    Time 5    Period Weeks    Status Partially Met    Target Date 01/01/21      PT LONG TERM GOAL #5   Title Patient will be modified independent in walking on even/uneven surface with least restrictive assistive device, for 20+ minutes without rest break, reporting some difficulty or less to improve walking tolerance with community ambulation including grocery shopping, going to church,etc.    Baseline 5/2: at  home can do 10 minutes with breaks 5/23: limited by pain in hip    Time 5    Period Weeks    Status On-going    Target Date 01/01/21                 Plan - 12/28/20 1227    Clinical Impression Statement Patient is challenged with maintaining upright posture with prolonged ambulation.He is aware next  session is last scheduled LSVT session. Education on need to follow up with physician about continued hip pain due to implications with his mobility. Use of roller assists in pain for short duration. Fine motor skills are improving with patient's ability to handle cards.  Patient will benefit from LSVT program to increase functional mobility, improve ADL performance, and improve quality of life    Personal Factors and Comorbidities Age;Comorbidity 3+;Past/Current Experience;Fitness;Time since onset of injury/illness/exacerbation    Comorbidities L1 vertebral compression fx, AAA, anemia, essential tremor.    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 Evolving/Moderate complexity    Rehab Potential Fair    PT Frequency 4x / week    PT Duration Other (comment)   5 weeks   PT Treatment/Interventions ADLs/Self Care Home Management;Cryotherapy;Electrical Stimulation;Iontophoresis 59m/ml Dexamethasone;Moist Heat;Traction;DME Instruction;Gait training;Stair training;Functional mobility training;Therapeutic activities;Therapeutic exercise;Balance training;Neuromuscular re-education;Manual techniques;Patient/family education;Passive range of motion;Dry needling;Vestibular;Canalith Repostioning;Taping;Visual/perceptual remediation/compensation;Energy conservation    PT Next Visit Plan LSVT program    PT Home Exercise Plan next session    Consulted and Agree with Plan of Care Patient;Family member/caregiver    Family Member Consulted wife           Patient will benefit from skilled therapeutic intervention in order to improve the following deficits and impairments:   Abnormal gait,Cardiopulmonary status limiting activity,Decreased activity tolerance,Decreased balance,Decreased knowledge of precautions,Decreased endurance,Decreased coordination,Decreased knowledge of use of DME,Decreased mobility,Decreased safety awareness,Difficulty walking,Decreased strength,Impaired flexibility,Impaired perceived functional ability,Impaired tone,Impaired UE functional use,Postural dysfunction,Improper body mechanics,Pain  Visit Diagnosis: Parkinson's disease [place]  Other abnormalities of gait and mobility  Unsteadiness on feet  Unspecified lack of coordination     Problem List Patient Active Problem List   Diagnosis Date Noted  . Macular hole, right eye 03/18/2017   MJanna Arch PT, DPT   12/28/2020, 12:30 PM  [place] [facility] [address] [address]Phone: [phone]  Fax:  [phone] Name: RDERRIC DEALMEIDAMRN: [id number] of Birth: 505/07/1950

## 2021-01-01 ENCOUNTER — Ambulatory Visit: Payer: Medicare Other | Admitting: Speech Pathology

## 2021-01-01 ENCOUNTER — Other Ambulatory Visit: Payer: Self-pay

## 2021-01-01 ENCOUNTER — Ambulatory Visit: Payer: Medicare Other

## 2021-01-01 DIAGNOSIS — R2681 Unsteadiness on feet: Secondary | ICD-10-CM

## 2021-01-01 DIAGNOSIS — G20 Parkinson's disease: Secondary | ICD-10-CM

## 2021-01-01 DIAGNOSIS — R2689 Other abnormalities of gait and mobility: Secondary | ICD-10-CM

## 2021-01-01 DIAGNOSIS — R471 Dysarthria and anarthria: Secondary | ICD-10-CM

## 2021-01-01 NOTE — Therapy (Signed)
[place] [facility] MAIN [facility] SERVICES [address] [place], [place], [address] Phone: [phone]   Fax:  [phone]  Speech Language Pathology Treatment  Patient Details  Name: Nicolas Solis MRN: [id number] Date of Birth: 1950/08/22 Referring Provider (SLP): Dr. Sherryll Burger   Encounter Date: 01/01/2021   End of Session - 01/01/21 1134    Visit Number 16    Number of Visits 17    Date for SLP Re-Evaluation 02/25/21    Authorization Type MCR    Progress Note Due on Visit 10    SLP Start Time 1000    SLP Stop Time  1100    SLP Time Calculation (min) 60 min    Activity Tolerance Patient tolerated treatment well           Past Medical History:  Diagnosis Date  . Compression fracture of first cervical vertebra (HCC)   . Essential tremor   . Ileus [facility])     Past Surgical History:  Procedure Laterality Date  . 25 GAUGE PARS PLANA VITRECTOMY WITH 20 GAUGE MVR PORT Right 03/18/2017  . 25 GAUGE PARS PLANA VITRECTOMY WITH 20 GAUGE MVR PORT FOR MACULAR HOLE Right 03/18/2017   Procedure: 25 GAUGE PARS PLANA VITRECTOMY WITH 20 GAUGE MVR PORT FOR MACULAR HOLE; PHOTOCOAGULATION RIGHT EYE;  Surgeon: Sherrie George, MD;  Location: [facility] OR;  Service: Ophthalmology;  Laterality: Right;  . APPENDECTOMY    . COLONOSCOPY W/ POLYPECTOMY    . GAS INSERTION Right 03/18/2017   Procedure: INSERTION OF GAS RIGHT (C3F8);  Surgeon: Sherrie George, MD;  Location: [facility] OR;  Service: Ophthalmology;  Laterality: Right;  . GAS/FLUID EXCHANGE Right 03/18/2017   Procedure: GAS/FLUID EXCHANGE RIGHT EYE;  Surgeon: Sherrie George, MD;  Location: [facility] OR;  Service: Ophthalmology;  Laterality: Right;  . JOINT REPLACEMENT Right    Hip  . MEMBRANE PEEL Right 03/18/2017   Procedure: MEMBRANE PEEL RIGHT EYE;  Surgeon: Sherrie George, MD;  Location: [facility] OR;  Service: Ophthalmology;  Laterality: Right;  . MOUTH SURGERY    . PHOTOCOAGULATION Left 03/18/2017   Procedure: PHOTOCOAGULATION  LEFT EYE;  Surgeon: Sherrie George, MD;  Location: [facility] OR;  Service: Ophthalmology;  Laterality: Left;  . SERUM PATCH Right 03/18/2017   Procedure: SERUM PATCH RIGHT EYE;  Surgeon: Sherrie George, MD;  Location: [facility] OR;  Service: Ophthalmology;  Laterality: Right;    There were no vitals filed for this visit.   Subjective Assessment - 01/01/21 1117    Subjective "I mowed the lawn this weekend."    Currently in Pain? No/denies                 ADULT SLP TREATMENT - 01/01/21 0001      General Information   Behavior/Cognition Alert;Cooperative    HPI [name] is a 70 y.o. male referred by Dr. Sherryll Burger for LSVT-LOUD therapy due to decreased volume of speech secondary to Parkinson's disease, diagnosed November 2021.      Cognitive-Linquistic Treatment   Treatment focused on Dysarthria;Patient/family/caregiver education    Skilled Treatment LSVT Daily tasks with minimal cues for loudness and duration. Loud /a/ averaged 86 dB, duration 3.3 seconds. Patient used effort level 8/10 for loud /a/ task. Max F0 for high pitch glides was 260 Hz (86 dB avg). Min F0 for low glides was 139 Hz (84 dB avg). Functional phrases averaged 87 dB with modified independence. Patient engaged in short conversational periods lasting ~ 10 minutes in  duration averaging 81 dB, with minimal cues for loudness due to occasional dips in vocal intensity. Patient completed multi-paragraph reading task with an average of 81 dB. Spontaneous responses to "off the cuff" questions averaged 81 dB, with minimal cues for loudness.      Assessment / Recommendations / Plan   Plan Continue with current plan of care      Progression Toward Goals   Progression toward goals Progressing toward goals            SLP Education - 01/01/21 1132    Education Details told patient to use loud voice in PT    Person(s) Educated Patient    Methods Explanation    Comprehension Verbalized understanding;Need further instruction             SLP Short Term Goals - 12/19/20 1156      SLP SHORT TERM GOAL #1   Title The patient will complete Daily Tasks (Maximum duration "ah", High/Lows, and Functional Phrases) at average loudness >/= 80 dB and with loud, good quality voice with min cues.    Time 10    Period --   sessions   Status Achieved      SLP SHORT TERM GOAL #2   Title The patient will complete Hierarchal Speech Loudness reading drills (words/phrases, sentences) at average >/= 75 dB and with loud, good quality voice with min cues.    Time 10    Period --   sessions   Status Achieved      SLP SHORT TERM GOAL #3   Title The patient will participate in 5-8 minutes conversation, maintaining average loudness of 75 dB and loud, good quality voice with min cues.    Time 10    Period --   sessions   Status Achieved            SLP Long Term Goals - 12/19/20 1157      SLP LONG TERM GOAL #1   Title The patient will complete Daily Tasks (Maximum duration "ah", High/Lows, and Functional Phrases) at average loudness >/= 80 dB and with loud, good quality voice.    Time 4   or 17 sessions, for all LTGs   Period Weeks    Status On-going      SLP LONG TERM GOAL #2   Title The patient will complete Hierarchal Speech Loudness reading drills (words/phrases, sentences, and paragraph) at average >/= 75 dB and with loud, good quality voice.    Time 4    Period Weeks    Status On-going      SLP LONG TERM GOAL #3   Title The patient will participate in 15-20 minutes conversation, maintaining average loudness of 75 dB and loud, good quality voice.    Time 4    Period Weeks    Status On-going      SLP LONG TERM GOAL #4   Title Patient will report improved communication effectiveness as measured by Communicative Effectiveness Survey.    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 Patient entered treatment room with an increased vocal intensity/loudness. Patient is responding well to intensity-based cues, and is improving ability to  maintain vocal intensity and quality in structured tasks, with more frequent carryover to conversation. Continue skilled ST to maximize carryover of loudness and vocal quality, improve functional communication and intelligibility for conversations with family, friends and in the community.    Speech Therapy Frequency 4x / week    Duration 4 weeks    Treatment/Interventions Cueing hierarchy;SLP instruction and feedback;Functional tasks;Patient/family education    Potential to Achieve Goals Good    SLP Home Exercise Plan provided, see instruction section    Consulted and Agree with Plan of Care Patient           Patient will benefit from skilled therapeutic intervention in order to improve the following deficits and impairments:   Dysarthria and anarthria  Parkinson's disease [facility])    Problem List Patient Active Problem List   Diagnosis Date Noted  . Macular hole, right eye 03/18/2017   Essie Hart, SLP Graduate Clinician  Essie Hart 01/01/2021, 11:46 AM  [place] [facility] MAIN [facility] SERVICES [address] [place], [place], [address] Phone: [phone]   Fax:  [phone]   Name: KEYANTE DURIO MRN: [id number] Date of Birth: Mar 03, 1951

## 2021-01-01 NOTE — Therapy (Signed)
[place] [address] [place], [place], [address] Phone: [phone]   Fax:  [phone]  Physical Therapy Treatment/DISCHARGE  Patient Details  Name: Nicolas Solis MRN: [id number] Date of Birth: 1951/03/09 Referring Provider (PT): Jennings Books   Encounter Date: 01/01/2021   PT End of Session - 01/01/21 1128    Visit Number 17    Number of Visits 17    Date for PT Re-Evaluation 01/01/21    Authorization Type 7/10 PN 5/23    PT Start Time 1101    PT Stop Time 1156    PT Time Calculation (min) 55 min    Equipment Utilized During Treatment Gait belt    Activity Tolerance Patient tolerated treatment well    Behavior During Therapy WFL for tasks assessed/performed           Past Medical History:  Diagnosis Date  . Compression fracture of first cervical vertebra (HCC)   . Essential tremor   . Ileus [facility])     Past Surgical History:  Procedure Laterality Date  . [place] VITRECTOMY WITH 20 GAUGE MVR PORT Right 03/18/2017  . 25 GAUGE PARS PLANA VITRECTOMY WITH 20 GAUGE MVR PORT FOR MACULAR HOLE Right 03/18/2017   Procedure: 25 GAUGE PARS PLANA VITRECTOMY WITH 20 GAUGE MVR PORT FOR MACULAR HOLE; PHOTOCOAGULATION RIGHT EYE;  Surgeon: Hayden Pedro, MD;  Location: [place];  Service: Ophthalmology;  Laterality: Right;  . APPENDECTOMY    . COLONOSCOPY W/ POLYPECTOMY    . GAS INSERTION Right 03/18/2017   Procedure: INSERTION OF GAS RIGHT (C3F8);  Surgeon: Hayden Pedro, MD;  Location: [place];  Service: Ophthalmology;  Laterality: Right;  . GAS/FLUID EXCHANGE Right 03/18/2017   Procedure: GAS/FLUID EXCHANGE RIGHT EYE;  Surgeon: Hayden Pedro, MD;  Location: [place];  Service: Ophthalmology;  Laterality: Right;  . JOINT REPLACEMENT Right    Hip  . MEMBRANE PEEL Right 03/18/2017   Procedure: MEMBRANE PEEL RIGHT EYE;  Surgeon: Hayden Pedro, MD;  Location: [place];  Service: Ophthalmology;  Laterality: Right;   . MOUTH SURGERY    . PHOTOCOAGULATION Left 03/18/2017   Procedure: PHOTOCOAGULATION LEFT EYE;  Surgeon: Hayden Pedro, MD;  Location: [place];  Service: Ophthalmology;  Laterality: Left;  . SERUM PATCH Right 03/18/2017   Procedure: SERUM PATCH RIGHT EYE;  Surgeon: Hayden Pedro, MD;  Location: [place];  Service: Ophthalmology;  Laterality: Right;    There were no vitals filed for this visit.   Subjective Assessment - 01/01/21 1127    Subjective Patient is aware today is last PT session for LSVT    Patient is accompained by: Family member    Pertinent History Patient is a 70 year old male who presents for LSVT BIG and LOUD evaluation. Patient recently diagnosed with Parkinson's disease (05/2020) and is Sinemet. He has increased urinary frequency and nocturia as well as low back pain with ambulation. Cogwheeling with his R more than L is noted per neurologist. Was doing [employer] in [place].  Has been using a cane since 2017 and now is on a walker. PMH includes L1 vertebral compression fx, AAA, anemia, essential tremor.    Limitations Lifting;Standing;Walking;House hold activities;Other (comment)    How long can you sit comfortably? R hip pain    How long can you stand comfortably? haven't tried: needs to hold on 2 minutes    How long can you walk comfortably? 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    Patient Stated Goals to be able to walk better    Currently in Pain? No/denies                 Discharge HEP: intermittent compliance  FOTO: 58%  5x STS: 11 seconds no UE support TUG: 11 seconds no AD  Walk 20 minutes: requires one seated rest break.      Treatment:   Patient seen for LSVT Daily Session Adapted Daily Exercises for facilitation/coordination of movement Sustained movements are designed to rescale the amplitude of movement  output for generalization to daily functional activities .Performed as follows for 1 set of 10 repetitions each multidirectional sustained movements  1) Floor to ceiling , cues to hold for 10 seconds, needs modeling for correct positions , VC to reach out further and to reach up to the ceiling 2) Side to side multidirectional Repetitive movements performed in sitting and are designed to provide retraining effort needed for sustained muscle activation in tasks , cues to lean fwd and hold position x 10 counts: Patient does not move forward to side sitting to forward smoothly and is not able to transition or stretch out leg very far 3) Step and reach forward , cues for good knee flex, cues to move UE and LE together, cues to rotate  arms up to pronate forearms while holding onto chair 4) Step and reach sideways, cues to turn  head sideways, large step sideways and turn head back to neutral, and to rotate arms and pronate forearms while holding onto chair with opposite hand.  5) Step and reach backwards, cues for SUE back, getting toe up and bending back knee while holding onto chair and then swinging arm forward and stomping  6) Rock and reach forward/backward, cues to reach far fwd with UE, patient not comfortable with feet apart , not rocking very far to get a good weight shift, not able to raise  heel or raise  toes much with SUE on chair.  7) Rock and reach sideways, cues for twist and look behind , only rotates sideways, unable to twist around or turn to look behind with one hand on chair   functional component task with supervision 5 reps and simulated activities for: 1. Sit to stand x 5   needs cues for nose over toes and explosive movement.  2. signing name 3.put pant leg on/off 4.tall posture walking 5. Large steps walking:   Treatment: Step up onto 18.25 inch step x 8 trials for carryover to horseback riding Sign name on line 5x Roller to R abductor x3 minutes    Long ambulation  for442f with walking sticks, cueing for large steps and upright posture; one seated rest break.Ambulate negotiating turns, people, with one seated rest break.   Today is patient's last LSVT treatment session. He has made progress in regards to ambulation, stabilization, and mobility. His signature has additionally improved as well as flexibility as seen in ability to lift leg up to height of stirrup.  I will be happy to see this patient again in the future as needed.                    PT Education - 01/01/21 1127    Education Details discharge    Person(s) Educated Patient    Methods Explanation  Comprehension Verbalized understanding            PT Short Term Goals - 01/01/21 1127      PT SHORT TERM GOAL #1   Title Patient will be independent in home exercise program to improve strength/mobility for better functional independence with ADLs.    Baseline 5/23: intmittent compliance  6/6: intermittent compliance    Time 2    Period Weeks    Status Partially Met    Target Date 12/11/20      PT SHORT TERM GOAL #2   Title Patient will be able to stand 5 x from standard height chair without UE support to increase functional mobility    Baseline 5/2: requires use of BUE support 5/23: able to perform    Time 2    Period Weeks    Status Achieved    Target Date 12/11/20             PT Long Term Goals - 01/01/21 1237      PT LONG TERM GOAL #1   Title Patient will increase FOTO score to equal to or greater than  53%   to demonstrate statistically significant improvement in mobility and quality of life.    Baseline 5/2: 40% 5/23: 50% 6/6: 58%    Time 5    Period Weeks    Status Achieved      PT LONG TERM GOAL #2   Title Patient (> 68 years old) will complete five times sit to stand test in < 15 seconds without UE support indicating an increased LE strength and improved balance.    Baseline 5/2: unable to perform w/o UE support 15.35 seconds with heavy BUE  support 5/23: 11.44 seconds w/o UE support 6/6: 11 seconds    Time 5    Period Weeks    Status Achieved      PT LONG TERM GOAL #3   Title Patient will increase Berg Balance score by > 6 points (35/56)  to demonstrate decreased fall risk during functional activities.    Baseline 5/2: 29/56 5/23: 39/56    Time 4    Period Weeks    Status Achieved      PT LONG TERM GOAL #4   Title Patient will reduce timed up and go to <11 seconds to reduce fall risk and demonstrate improved transfer/gait ability.    Baseline 5/2: 14.21 w AD 5/23: 14.01 seconds w/o AD 6/6: 11 seconds no AD    Time 5    Period Weeks    Status Achieved      PT LONG TERM GOAL #5   Title Patient will be modified independent in walking on even/uneven surface with least restrictive assistive device, for 20+ minutes without rest break, reporting some difficulty or less to improve walking tolerance with community ambulation including grocery shopping, going to church,etc.    Baseline 5/2: at home can do 10 minutes with breaks 5/23: limited by pain in hip 6/6: one seated rest break    Time 5    Period Weeks    Status Partially Met                 Plan - 01/01/21 1236    Clinical Impression Statement Today is patient's last LSVT treatment session. He has made progress in regards to ambulation, stabilization, and mobility. His signature has additionally improved as well as flexibility as seen in ability to lift leg up to height of stirrup.  I will be happy  to see this patient again in the future as needed.    Personal Factors and Comorbidities Age;Comorbidity 3+;Past/Current Experience;Fitness;Time since onset of injury/illness/exacerbation    Comorbidities L1 vertebral compression fx, AAA, anemia, essential tremor.    Examination-Activity Limitations Bathing;Bend;Caring for Others;Dressing;Hygiene/Grooming;Locomotion Level;Reach Overhead;Sit;Transfers;Stand;Stairs;Squat    Examination-Participation Restrictions  Church;Cleaning;Community Activity;Driving;Interpersonal Relationship;Personal Finances;Meal Prep;Medication Management;Laundry;Shop;Volunteer;Yard Work    [employer] Evolving/Moderate complexity    Rehab Potential Fair    PT Frequency 4x / week    PT Duration Other (comment)   5 weeks   PT Treatment/Interventions ADLs/Self Care Home Management;Cryotherapy;Electrical Stimulation;Iontophoresis 53m/ml Dexamethasone;Moist Heat;Traction;DME Instruction;Gait training;Stair training;Functional mobility training;Therapeutic activities;Therapeutic exercise;Balance training;Neuromuscular re-education;Manual techniques;Patient/family education;Passive range of motion;Dry needling;Vestibular;Canalith Repostioning;Taping;Visual/perceptual remediation/compensation;Energy conservation    PT Next Visit Plan LSVT program    PT Home Exercise Plan next session    Consulted and Agree with Plan of Care Patient;Family member/caregiver    Family Member Consulted wife           Patient will benefit from skilled therapeutic intervention in order to improve the following deficits and impairments:  Abnormal gait,Cardiopulmonary status limiting activity,Decreased activity tolerance,Decreased balance,Decreased knowledge of precautions,Decreased endurance,Decreased coordination,Decreased knowledge of use of DME,Decreased mobility,Decreased safety awareness,Difficulty walking,Decreased strength,Impaired flexibility,Impaired perceived functional ability,Impaired tone,Impaired UE functional use,Postural dysfunction,Improper body mechanics,Pain  Visit Diagnosis: Parkinson's disease ([place]  Other abnormalities of gait and mobility  Unsteadiness on feet     Problem List Patient Active Problem List   Diagnosis Date Noted  . Macular hole, right eye 03/18/2017   MJanna Arch PT, DPT   01/01/2021, 12:41 PM  [facility] [address] [place] [address]Phone: [phone]  Fax:  [phone] Name: Nicolas ZUNKERMRN: [id number] of Birth: 524-Jun-1952

## 2021-01-02 ENCOUNTER — Ambulatory Visit: Payer: Medicare Other

## 2021-01-02 ENCOUNTER — Ambulatory Visit: Payer: Medicare Other | Admitting: Speech Pathology

## 2021-01-02 DIAGNOSIS — R471 Dysarthria and anarthria: Secondary | ICD-10-CM

## 2021-01-02 DIAGNOSIS — G20 Parkinson's disease: Secondary | ICD-10-CM

## 2021-01-02 NOTE — Therapy (Signed)
[place] MAIN [facility] SERVICES [address] [place], [place], [address] Phone: [phone]   Fax:  [phone]  Speech Language Pathology Treatment and Discharge Summary  Patient Details  Name: Nicolas Solis MRN: [id number] Date of Birth: Apr 16, 1951 Referring Provider (SLP): Dr. Manuella Ghazi   Encounter Date: 01/02/2021   End of Session - 01/02/21 1528    Visit Number 17    Number of Visits 17    Date for SLP Re-Evaluation 02/25/21    Authorization Type MCR    SLP Start Time 59    SLP Stop Time  1400    SLP Time Calculation (min) 60 min    Activity Tolerance Patient tolerated treatment well           Past Medical History:  Diagnosis Date  . Compression fracture of first cervical vertebra (HCC)   . Essential tremor   . Ileus [facility])     Past Surgical History:  Procedure Laterality Date  . [place] VITRECTOMY WITH 20 GAUGE MVR PORT Right 03/18/2017  . 25 GAUGE PARS PLANA VITRECTOMY WITH 20 GAUGE MVR PORT FOR MACULAR HOLE Right 03/18/2017   Procedure: 25 GAUGE PARS PLANA VITRECTOMY WITH 20 GAUGE MVR PORT FOR MACULAR HOLE; PHOTOCOAGULATION RIGHT EYE;  Surgeon: Hayden Pedro, MD;  Location: [place];  Service: Ophthalmology;  Laterality: Right;  . APPENDECTOMY    . COLONOSCOPY W/ POLYPECTOMY    . GAS INSERTION Right 03/18/2017   Procedure: INSERTION OF GAS RIGHT (C3F8);  Surgeon: Hayden Pedro, MD;  Location: [place];  Service: Ophthalmology;  Laterality: Right;  . GAS/FLUID EXCHANGE Right 03/18/2017   Procedure: GAS/FLUID EXCHANGE RIGHT EYE;  Surgeon: Hayden Pedro, MD;  Location: [place];  Service: Ophthalmology;  Laterality: Right;  . JOINT REPLACEMENT Right    Hip  . MEMBRANE PEEL Right 03/18/2017   Procedure: MEMBRANE PEEL RIGHT EYE;  Surgeon: Hayden Pedro, MD;  Location: [place];  Service: Ophthalmology;  Laterality: Right;  . MOUTH SURGERY    . PHOTOCOAGULATION Left 03/18/2017   Procedure: PHOTOCOAGULATION LEFT EYE;   Surgeon: Hayden Pedro, MD;  Location: [place];  Service: Ophthalmology;  Laterality: Left;  . SERUM PATCH Right 03/18/2017   Procedure: SERUM PATCH RIGHT EYE;  Surgeon: Hayden Pedro, MD;  Location: [place];  Service: Ophthalmology;  Laterality: Right;    There were no vitals filed for this visit.   Subjective Assessment - 01/02/21 1514    Subjective "I'm going to the nursery this afternoon."                 ADULT SLP TREATMENT - 01/02/21 0001      General Information   Behavior/Cognition Alert;Cooperative    HPI Nicolas Solis is a 70 y.o. male referred by Dr. Manuella Ghazi for LSVT-LOUD therapy due to decreased volume of speech secondary to Parkinson's disease, diagnosed November 2021.      Cognitive-Linquistic Treatment   Treatment focused on Dysarthria;Patient/family/caregiver education    Skilled Treatment LSVT Daily tasks with minimal cues for vocal quality. Loud /a/ averaged 86 dB, duration average ~3 seconds. Patient benefitted from modeling/demonstration to achieve beter vocal quality. Max F0 for high pitch glides was 260 Hz (87 dB avg). Min F0 for low glides was 151 Hz (84 dB avg). Functional phrases averaged 87 dB with modified independence. Patient engaged in conversation for 15 minutes in duration averaging 81 dB, with minimal cues for loudness due to occasional dips in vocal  intensity. Patient completed multi-paragraph reading task with an average of 81 dB. Spontaneous responses to "off the cuff" questions averaged 81 dB.   The Communication Effectiveness Survey is a patient-reported outcome measure in which the patient rates their own effectiveness in different communication situations. A higher score indicates greater effectiveness. Pt's self-rating was 29/32. Patient score improved from 22/32 at eval.      Assessment / Recommendations / Plan   Plan Discharge SLP treatment due to (comment)   Goals met     Progression Toward Goals   Progression toward goals Goals met,  education completed, patient discharged from SLP            SLP Education - 01/02/21 1527    Education Details Use loud voice in all settings upon discharge from speech therapy    Person(s) Educated Patient    Methods Explanation    Comprehension Verbalized understanding            SLP Short Term Goals - 01/02/21 1543      SLP SHORT TERM GOAL #1   Title The patient will complete Daily Tasks (Maximum duration "ah", High/Lows, and Functional Phrases) at average loudness >/= 80 dB and with loud, good quality voice with min cues.    Time 10    Period --   sessions   Status Achieved      SLP SHORT TERM GOAL #2   Title The patient will complete Hierarchal Speech Loudness reading drills (words/phrases, sentences) at average >/= 75 dB and with loud, good quality voice with min cues.    Time 10    Period --   sessions   Status Achieved      SLP SHORT TERM GOAL #3   Title The patient will participate in 5-8 minutes conversation, maintaining average loudness of 75 dB and loud, good quality voice with min cues.    Time 10    Period --   sessions   Status Achieved            SLP Long Term Goals - 01/02/21 1543      SLP LONG TERM GOAL #1   Title The patient will complete Daily Tasks (Maximum duration "ah", High/Lows, and Functional Phrases) at average loudness >/= 80 dB and with loud, good quality voice.    Time 4   or 17 sessions, for all LTGs   Period Weeks    Status Achieved      SLP LONG TERM GOAL #2   Title The patient will complete Hierarchal Speech Loudness reading drills (words/phrases, sentences, and paragraph) at average >/= 75 dB and with loud, good quality voice.    Time 4    Period Weeks    Status Achieved      SLP LONG TERM GOAL #3   Title The patient will participate in 15-20 minutes conversation, maintaining average loudness of 75 dB and loud, good quality voice.    Time 4    Period Weeks    Status Achieved      SLP LONG TERM GOAL #4   Title Patient will  report improved communication effectiveness as measured by Communicative Effectiveness Survey.    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 Patient initially required modeling/demonstration for vocal quality. Patient was seen at a different time today, which could serve as a contributing factor for the patient's reduced vocal quality and the patient's ability to complete some speech tasks. Patient's commuicative effectiveness survey reported that patient feels speech is effective in various opportunities and settings. Patient has responded well to intensity-based cues, and has demonstrated ability to maintain vocal intensity and quality in structured tasks, with more frequent carryover to conversation.    Speech Therapy Frequency --   d/c   Duration --   d/c   Treatment/Interventions Cueing hierarchy;SLP instruction and feedback;Functional tasks;Patient/family education    Potential to Achieve Goals Good    SLP Home Exercise Plan provided, see instruction section    Consulted and Agree with Plan of Care Patient           Patient will benefit from skilled therapeutic intervention in order to improve the following deficits and impairments:   Dysarthria and anarthria  Parkinson's disease ([place])   SPEECH THERAPY DISCHARGE SUMMARY  Visits from Start of Care: 17  Current functional level related to goals / functional outcomes: Patient met all STG and 4/4 LTGs. Maintained average of 81 dB in conversation with min cues today.    Remaining deficits: Mild hypokinetic dysarthria persists; patient may need reminders from family/communication partners for louder speech particularly in noisy  environments.    Education / Equipment: Maintenance tasks for loudness, using effort level in all conversations  Plan: Patient agrees to discharge.  Patient goals were met. Patient is being discharged due to meeting the stated rehab goals.  ?????         Problem List Patient Active Problem List   Diagnosis Date Noted  . Macular hole, right eye 03/18/2017   Brooke Dare, SLP Graduate Clinician  Brooke Dare 01/02/2021, 3:45 PM  [place] MAIN [facility] SERVICES [address] [place], [place], [address] Phone: [phone]   Fax:  [phone]   Name: Nicolas Solis MRN: [id number] Date of Birth: 1950/10/05

## 2021-01-03 ENCOUNTER — Ambulatory Visit: Payer: Medicare Other | Admitting: Speech Pathology

## 2022-01-13 ENCOUNTER — Other Ambulatory Visit: Payer: Self-pay

## 2022-01-13 ENCOUNTER — Emergency Department: Payer: Medicare Other

## 2022-01-13 ENCOUNTER — Emergency Department
Admission: EM | Admit: 2022-01-13 | Discharge: 2022-01-13 | Disposition: A | Discharge status: Home / Self Care | Payer: Medicare Other | Attending: Emergency Medicine | Admitting: Emergency Medicine

## 2022-01-13 DIAGNOSIS — R0789 Other chest pain: Secondary | ICD-10-CM | POA: Diagnosis not present

## 2022-01-13 DIAGNOSIS — G20 Parkinson's disease: Secondary | ICD-10-CM | POA: Diagnosis not present

## 2022-01-13 DIAGNOSIS — I10 Essential (primary) hypertension: Secondary | ICD-10-CM | POA: Insufficient documentation

## 2022-01-13 LAB — COMPREHENSIVE METABOLIC PANEL
ALT: 8 U/L (ref 0–44)
AST: 28 U/L (ref 15–41)
Albumin: 4.2 g/dL (ref 3.5–5.0)
Alkaline Phosphatase: 82 U/L (ref 38–126)
Anion gap: 8 (ref 5–15)
BUN: 10 mg/dL (ref 8–23)
CO2: 26 mmol/L (ref 22–32)
Calcium: 8.9 mg/dL (ref 8.9–10.3)
Chloride: 102 mmol/L (ref 98–111)
Creatinine, Ser: 0.54 mg/dL — ABNORMAL LOW (ref 0.61–1.24)
GFR, Estimated: >60 mL/min (ref >60)
Glucose, Bld: 110 mg/dL — ABNORMAL HIGH (ref 70–99)
Potassium: 3.9 mmol/L (ref 3.5–5.1)
Sodium: 136 mmol/L (ref 135–145)
Total Bilirubin: 0.8 mg/dL (ref 0.3–1.2)
Total Protein: 8.7 g/dL — ABNORMAL HIGH (ref 6.5–8.1)

## 2022-01-13 LAB — CBC
HCT: 44.8 % (ref 39.0–52.0)
Hemoglobin: 14.8 g/dL (ref 13.0–17.0)
MCH: 34 pg (ref 26.0–34.0)
MCHC: 33 g/dL (ref 30.0–36.0)
MCV: 103 fL — ABNORMAL HIGH (ref 80.0–100.0)
Platelets: 159 10*3/uL (ref 150–400)
RBC: 4.35 MIL/uL (ref 4.22–5.81)
RDW: 12.8 % (ref 11.5–15.5)
WBC: 6.4 10*3/uL (ref 4.0–10.5)
nRBC: 0 % (ref 0.0–0.2)

## 2022-01-13 LAB — TROPONIN I (HIGH SENSITIVITY)
Troponin I (High Sensitivity): 10 ng/L (ref <18)
Troponin I (High Sensitivity): 9 ng/L (ref <18)

## 2022-01-13 MED ORDER — ACETAMINOPHEN 500 MG PO TABS
1000.0000 mg | ORAL_TABLET | Freq: Once | ORAL | Status: AC
  Administered 2022-01-13: 1000 mg via ORAL
  Filled 2022-01-13: qty 2

## 2022-01-13 NOTE — ED Provider Notes (Signed)
[facility] Provider Note    None    (approximate)   History   Chest Pain   HPI  Nicolas Solis is a 71 y.o. male who presents to the ED for evaluation of Chest Pain   I reviewed neurology clinic visit from 2/2.  History of Parkinson's disease on Sinemet. No coronary history.  Patient presents to the ED with his wife for evaluation of resolving chest discomfort and hypertension at home.  Patient reports symptoms started early this morning around 3 AM.  He reports an achiness and soreness to his chest superimposed on chronic diffuse myalgias that he attributes to his parkinsonism.  Denies any exertional dyspnea or exertional chest pressure.  Reports resolving symptoms by the time I see him in and is feeling better now after Tylenol.  They report wanted to get checked out because they checked his blood pressure at home with a wrist cuff and systolic was elevated to 190.  Physical Exam   Triage Vital Signs: ED Triage Vitals  Enc Vitals Group     BP 01/13/22 0507 131/75     Pulse Rate 01/13/22 0507 84     Resp 01/13/22 0507 16     Temp 01/13/22 0707 97.8 F (36.6 C)     Temp Source 01/13/22 0507 Oral     SpO2 01/13/22 0507 98 %     Weight 01/13/22 0507 175 lb (79.4 kg)     Height 01/13/22 0507 6' (1.829 m)     Head Circumference --      Peak Flow --      Pain Score 01/13/22 0507 8     Pain Loc --      Pain Edu? --      Excl. in GC? --     Most recent vital signs: Vitals:   01/13/22 0708 01/13/22 0842  BP: 127/69 132/75  Pulse: 80 79  Resp: 16 16  Temp: 97.8 F (36.6 C)   SpO2: 97% 96%    General: Awake, no distress.  Pleasant and conversational.  Tremulousness and parkinsonism noted. CV:  Good peripheral perfusion. RRR Resp:  Normal effort. CTAB Abd:  No distention.  MSK:  No deformity noted.  Neuro:  No focal deficits appreciated. Other:     ED Results / Procedures / Treatments   Labs (all labs ordered are listed, but only  abnormal results are displayed) Labs Reviewed  CBC - Abnormal; Notable for the following components:      Result Value   MCV 103.0 (*)    All other components within normal limits  COMPREHENSIVE METABOLIC PANEL - Abnormal; Notable for the following components:   Glucose, Bld 110 (*)    Creatinine, Ser 0.54 (*)    Total Protein 8.7 (*)    All other components within normal limits  TROPONIN I (HIGH SENSITIVITY)  TROPONIN I (HIGH SENSITIVITY)    EKG Sinus rhythm, rate of 86 bpm.  Normal axis.  Right bundle.  No clear signs of acute ischemia.  No comparison.  RADIOLOGY CXR interpreted by me without evidence of acute cardiopulmonary pathology.  Official radiology report(s): DG Chest 2 View  Result Date: 01/13/2022 CLINICAL DATA:  71 year old male with history of chest pain. EXAM: CHEST - 2 VIEW COMPARISON:  No priors. FINDINGS: Lung volumes are normal. No consolidative airspace disease. No pleural effusions. No pneumothorax. No pulmonary nodule or mass noted. Pulmonary vasculature and the cardiomediastinal silhouette are within normal limits. Atherosclerosis in the thoracic aorta.  IMPRESSION: 1.  No radiographic evidence of acute cardiopulmonary disease. 2. Aortic atherosclerosis. Electronically Signed   By: Trudie Reed M.D.   On: 01/13/2022 06:10    PROCEDURES and INTERVENTIONS:  Procedures  Medications  acetaminophen (TYLENOL) tablet 1,000 mg (1,000 mg Oral Given 01/13/22 0710)     IMPRESSION / MDM / ASSESSMENT AND PLAN / ED COURSE  I reviewed the triage vital signs and the nursing notes.  Differential diagnosis includes, but is not limited to, ACS, PTX, PNA, muscle strain/spasm, PE, dissection  {Patient presents with symptoms of an acute illness or injury that is potentially life-threatening.  71-year male presents to the ED is nonspecific and atypical chest pain without evidence of acute pathology and suitable for outpatient management.  He looks well and has normal vital  signs.  EKG is nonischemic and has 2 negative troponins.  CBC and metabolic panel are benign without significant acute features.  CXR is clear.  We discussed the unlikely possibility of aortic pathology such as dissection or aneurysmal leak or rupture.  After shared decision making we decided to forego CTA to evaluate for this and we discussed close return precautions for the ED so he can go home and enjoy Father's Day with his family.  We discussed referral to cardiology and return precautions.      FINAL CLINICAL IMPRESSION(S) / ED DIAGNOSES   Final diagnoses:  Other chest pain  Parkinson disease (HCC)     Rx / DC Orders   ED Discharge Orders          Ordered    Ambulatory referral to Cardiology       Comments: If you have not heard from the Cardiology office within the next 72 hours please call [phone].   01/13/22 4268             Note:  This document was prepared using Dragon voice recognition software and may include unintentional dictation errors.   Delton [place], MD 01/13/22 1019

## 2022-01-13 NOTE — Discharge Instructions (Signed)
The clinic of the cardiologists showed should reach out to schedule an appointment for further studies.  If things get worse come back to the ED.

## 2022-01-13 NOTE — ED Triage Notes (Addendum)
Pt with onset of central chest pain at 0345. Pt denies nausea, no shob, no pain radiation, no dizziness. Pt appears in no acute distress. Pt states took asa 5x81mg  at 0400

## 2022-01-21 ENCOUNTER — Encounter: Payer: Self-pay | Admitting: Cardiology

## 2022-01-21 ENCOUNTER — Ambulatory Visit (INDEPENDENT_AMBULATORY_CARE_PROVIDER_SITE_OTHER): Payer: Medicare Other | Admitting: Cardiology

## 2022-01-21 VITALS — BP 116/70 | HR 80 | Ht 72.0 in | Wt 183.0 lb

## 2022-01-21 DIAGNOSIS — R072 Precordial pain: Secondary | ICD-10-CM

## 2022-01-21 MED ORDER — METOPROLOL TARTRATE 100 MG PO TABS: 100.0000 mg | ORAL_TABLET | Freq: Once | ORAL | 0 refills | Status: DC

## 2022-01-30 ENCOUNTER — Telehealth (HOSPITAL_COMMUNITY): Payer: Self-pay | Admitting: *Deleted

## 2022-01-30 NOTE — Telephone Encounter (Signed)
Reaching out to patient to offer assistance regarding upcoming cardiac imaging study; pt verbalizes understanding of appt date/time, parking situation and where to check in, pre-test NPO status and medications ordered, and verified current allergies; name and call back number provided for further questions should they arise  Nykiah Ma RN Navigator Cardiac Imaging [facility] [phone] office [phone] cell  Patient to take 100mg metoprolol tartrate and 15mg ivabradine two hours prior to his cardiac CT scan. 

## 2022-01-31 ENCOUNTER — Other Ambulatory Visit: Payer: Self-pay | Admitting: Cardiology

## 2022-01-31 ENCOUNTER — Ambulatory Visit
Admission: RE | Admit: 2022-01-31 | Discharge: 2022-01-31 | Disposition: A | Discharge status: Home / Self Care | Payer: Medicare Other | Source: Ambulatory Visit | Attending: Cardiology | Admitting: Cardiology

## 2022-01-31 DIAGNOSIS — R072 Precordial pain: Secondary | ICD-10-CM | POA: Insufficient documentation

## 2022-01-31 DIAGNOSIS — R931 Abnormal findings on diagnostic imaging of heart and coronary circulation: Secondary | ICD-10-CM

## 2022-01-31 MED ORDER — SODIUM CHLORIDE 0.9 % IV BOLUS
500.0000 mL | Freq: Once | INTRAVENOUS | Status: AC
  Administered 2022-01-31: 500 mL via INTRAVENOUS

## 2022-01-31 MED ORDER — NITROGLYCERIN 0.4 MG SL SUBL
0.4000 mg | SUBLINGUAL_TABLET | Freq: Once | SUBLINGUAL | Status: AC
  Administered 2022-01-31: 0.4 mg via SUBLINGUAL
  Filled 2022-01-31: qty 25

## 2022-01-31 MED ORDER — IOHEXOL 350 MG/ML SOLN
75.0000 mL | Freq: Once | INTRAVENOUS | Status: AC | PRN
  Administered 2022-01-31: 75 mL via INTRAVENOUS

## 2022-01-31 MED ORDER — NITROGLYCERIN 0.4 MG SL SUBL
SUBLINGUAL_TABLET | SUBLINGUAL | Status: AC
  Filled 2022-01-31: qty 1

## 2022-01-31 NOTE — Progress Notes (Signed)
Patient tolerated CT well.  Vital signs stable encourage to drink water throughout day.Reasons explained and verbalized understanding.   

## 2022-02-01 ENCOUNTER — Other Ambulatory Visit
Admission: RE | Admit: 2022-02-01 | Discharge: 2022-02-01 | Disposition: A | Discharge status: Home / Self Care | Payer: Medicare Other | Attending: Cardiology | Admitting: Cardiology

## 2022-02-01 ENCOUNTER — Ambulatory Visit (INDEPENDENT_AMBULATORY_CARE_PROVIDER_SITE_OTHER): Payer: Medicare Other | Admitting: Cardiology

## 2022-02-01 ENCOUNTER — Encounter: Payer: Self-pay | Admitting: Cardiology

## 2022-02-01 VITALS — BP 90/50 | HR 78 | Ht 72.0 in | Wt 181.0 lb

## 2022-02-01 DIAGNOSIS — I2510 Atherosclerotic heart disease of native coronary artery without angina pectoris: Secondary | ICD-10-CM | POA: Diagnosis present

## 2022-02-01 LAB — BASIC METABOLIC PANEL
Anion gap: 7 (ref 5–15)
BUN: 11 mg/dL (ref 8–23)
CO2: 26 mmol/L (ref 22–32)
Calcium: 8.8 mg/dL — ABNORMAL LOW (ref 8.9–10.3)
Chloride: 102 mmol/L (ref 98–111)
Creatinine, Ser: 0.62 mg/dL (ref 0.61–1.24)
GFR, Estimated: >60 mL/min (ref >60)
Glucose, Bld: 108 mg/dL — ABNORMAL HIGH (ref 70–99)
Potassium: 3.6 mmol/L (ref 3.5–5.1)
Sodium: 135 mmol/L (ref 135–145)

## 2022-02-01 MED ORDER — NITROGLYCERIN 0.4 MG SL SUBL: 0.4000 mg | SUBLINGUAL_TABLET | SUBLINGUAL | 0 refills | Status: DC | PRN

## 2022-02-01 MED ORDER — ATORVASTATIN CALCIUM 20 MG PO TABS: 20.0000 mg | ORAL_TABLET | Freq: Every day | ORAL | 3 refills | Status: DC

## 2022-02-01 MED ORDER — ASPIRIN 81 MG PO TBEC: 81.0000 mg | DELAYED_RELEASE_TABLET | Freq: Every day | ORAL | 3 refills | Status: AC

## 2022-02-01 NOTE — Patient Instructions (Addendum)
Medication Instructions:   Your physician has recommended you make the following change in your medication:    START taking Aspirin 81 MG once a day.  2.    START taking Atorvastatin (Lipitor) 20 MG once a day.  3.    START taking Nitroglycerin 0.4 MG under the tongue as needed for chest pain. May repeat for a max of 3 doses every 5 minutes.    *If you need a refill on your cardiac medications before your next appointment, please call your pharmacy*   Lab Work:  Please go to the [facility] after your office visit today for a Lab (BMP) draw.   Testing/Procedures:   You are scheduled for a Cardiac Catheterization on Wednesday, July 12 with Dr. Lorine Bears.  1. Please arrive at the Main Entrance A at [facility]: [address] [place], [place] [address] at 10:00 AM (This time is two hours before your procedure to ensure your preparation). Free valet parking service is available.   Special note: Every effort is made to have your procedure done on time. Please understand that emergencies sometimes delay scheduled procedures.  2. Diet: Do not eat solid foods after midnight.  You may have clear liquids until 5 AM upon the day of the procedure.  3. Labs: BMP drawn on 02/01/22 (needed repeat draw after contrast on 01/31/22), CBC drawn on 01/13/22  4. Medication instructions in preparation for your procedure:   Contrast Allergy: No  On the morning of your procedure, take Aspirin 81 MG and any morning medicines NOT listed above.  You may use sips of water.  5. Plan to go home the same day, you will only stay overnight if medically necessary. 6. You MUST have a responsible adult to drive you home. 7. An adult MUST be with you the first 24 hours after you arrive home. 8. Bring a current list of your medications, and the last time and date medication taken. 9. Bring ID and current insurance cards. 10.Please wear clothes that are easy to get on and off and wear slip-on  shoes.  Thank you for allowing [place] to care for you!   -- [facility]    Follow-Up: At [facility], you and your health needs are our priority.  As part of our continuing mission to provide you with exceptional heart care, we have created designated Provider Care Teams.  These Care Teams include your primary Cardiologist (physician) and Advanced Practice Providers (APPs -  Physician Assistants and Nurse Practitioners) who all work together to provide you with the care you need, when you need it.  We recommend signing up for the patient portal called "MyChart".  Sign up information is provided on this After Visit Summary.  MyChart is used to connect with patients for Virtual Visits (Telemedicine).  Patients are able to view lab/test results, encounter notes, upcoming appointments, etc.  Non-urgent messages can be sent to your provider as well.   To learn more about what you can do with MyChart, go to [email or web address].    Your next appointment:   6 week(s)  The format for your next appointment:   In Person  Provider:   Debbe Odea, MD    Other Instructions   Important Information About Sugar

## 2022-02-01 NOTE — H&P (View-Only) (Signed)
Cardiology Office Note:    Date:  02/01/2022   ID:  Nicolas Solis, DOB 1950-08-19, MRN [id number]  PCP:  Barbette Reichmann, MD   [facility] Providers Cardiologist:  None     Referring MD: Barbette Reichmann, MD   Chief Complaint  Patient presents with   Other    F/u CTA. Meds reviewed verbally with pt.    History of Present Illness:    Nicolas Solis is a 71 y.o. male with a hx of CAD (CCTA 01/31/2022 CTO RCA, sig stenosis in prox LAD and Lcx)Parkinson's disease, former smoker x50+ years presents for follow-up.  Previously seen due to chest pain.    Underwent coronary CTA 01/31/2022 showing significant three-vessel disease with CTO of RCA, severe stenosis in LAD and left circumflex.  States doing okay, has some fatigue when he overexerts himself.  No recent chest pain noted.  Echocardiogram was ordered but is currently pending.   Past Medical History:  Diagnosis Date   Compression fracture of first cervical vertebra (HCC)    Essential tremor    Ileus (HCC)     Past Surgical History:  Procedure Laterality Date   25 GAUGE PARS PLANA VITRECTOMY WITH 20 GAUGE MVR PORT Right 03/18/2017   25 GAUGE PARS PLANA VITRECTOMY WITH 20 GAUGE MVR PORT FOR MACULAR HOLE Right 03/18/2017   Procedure: 25 GAUGE PARS PLANA VITRECTOMY WITH 20 GAUGE MVR PORT FOR MACULAR HOLE; PHOTOCOAGULATION RIGHT EYE;  Surgeon: Sherrie George, MD;  Location: [facility] OR;  Service: Ophthalmology;  Laterality: Right;   APPENDECTOMY     COLONOSCOPY W/ POLYPECTOMY     GAS INSERTION Right 03/18/2017   Procedure: INSERTION OF GAS RIGHT (C3F8);  Surgeon: Sherrie George, MD;  Location: [facility] OR;  Service: Ophthalmology;  Laterality: Right;   GAS/FLUID EXCHANGE Right 03/18/2017   Procedure: GAS/FLUID EXCHANGE RIGHT EYE;  Surgeon: Sherrie George, MD;  Location: [facility] OR;  Service: Ophthalmology;  Laterality: Right;   JOINT REPLACEMENT Right    Hip   MEMBRANE PEEL Right 03/18/2017   Procedure: MEMBRANE PEEL RIGHT EYE;   Surgeon: Sherrie George, MD;  Location: [facility] OR;  Service: Ophthalmology;  Laterality: Right;   MOUTH SURGERY     PHOTOCOAGULATION Left 03/18/2017   Procedure: PHOTOCOAGULATION LEFT EYE;  Surgeon: Sherrie George, MD;  Location: [facility] OR;  Service: Ophthalmology;  Laterality: Left;   SERUM PATCH Right 03/18/2017   Procedure: SERUM PATCH RIGHT EYE;  Surgeon: Sherrie George, MD;  Location: [facility] OR;  Service: Ophthalmology;  Laterality: Right;    Current Medications: Current Meds  Medication Sig   aspirin EC 81 MG tablet Take 1 tablet (81 mg total) by mouth daily. Swallow whole.   atorvastatin (LIPITOR) 20 MG tablet Take 1 tablet (20 mg total) by mouth daily.   bacitracin-polymyxin b (POLYSPORIN) ophthalmic ointment Place 1 application into the right eye 3 (three) times daily. apply to eye every 12 hours while awake   brimonidine (ALPHAGAN) 0.2 % ophthalmic solution Place 1 drop into the right eye 2 (two) times daily.   carbidopa-levodopa (SINEMET IR) 25-100 MG tablet Take 2 tablets by mouth 3 (three) times daily.   Carbidopa-Levodopa ER (SINEMET CR) 25-100 MG tablet controlled release Take 1 tablet by mouth at bedtime.   dorzolamide-timolol (COSOPT) 22.3-6.8 MG/ML ophthalmic solution Place 1 drop into both eyes daily.   gatifloxacin (ZYMAXID) 0.5 % SOLN Place 1 drop into the right eye 4 (four) times daily.   HYDROcodone-acetaminophen (NORCO/VICODIN) 5-325 MG tablet  Take 1-2 tablets by mouth every 4 (four) hours as needed for moderate pain.   ibuprofen (ADVIL,MOTRIN) 200 MG tablet Take 600 mg by mouth daily as needed for headache or moderate pain.   Multiple Vitamin (MULTIVITAMIN WITH MINERALS) TABS tablet Take 2 tablets by mouth daily.   nicotine polacrilex (NICORETTE) 2 MG gum Take 0.25 each by mouth as needed for smoking cessation.   nitroGLYCERIN (NITROSTAT) 0.4 MG SL tablet Place 1 tablet (0.4 mg total) under the tongue every 5 (five) minutes as needed for chest pain. For a max of 3 doses in 1  day.   Omega-3 Fatty Acids (FISH OIL PO) Take 1 capsule by mouth daily.   prednisoLONE acetate (PRED FORTE) 1 % ophthalmic suspension Place 1 drop into the right eye 4 (four) times daily.   primidone (MYSOLINE) 50 MG tablet Take 50 mg by mouth every evening.    tamsulosin (FLOMAX) 0.4 MG CAPS capsule Take 0.4 mg by mouth daily.   vitamin B-12 (CYANOCOBALAMIN) 500 MCG tablet Take 500 mcg by mouth daily.   [DISCONTINUED] metoprolol tartrate (LOPRESSOR) 100 MG tablet Take 1 tablet (100 mg total) by mouth once for 1 dose. Take 2 hours prior to your CT scan.     Allergies:   Patient has no known allergies.   Social History   Socioeconomic History   Marital status: Married    Spouse name: Not on file   Number of children: Not on file   Years of education: Not on file   Highest education level: Not on file  Occupational History   Not on file  Tobacco Use   Smoking status: Former    Years: 50.00    Types: Cigarettes    Quit date: 10/2015    Years since quitting: 6.2   Smokeless tobacco: Never  Vaping Use   Vaping Use: Never used  Substance and Sexual Activity   Alcohol use: Yes    Alcohol/week: 7.0 standard drinks of alcohol    Types: 7 Shots of liquor per week   Drug use: No   Sexual activity: Not on file  Other Topics Concern   Not on file  Social History Narrative   Not on file   Social Determinants of Health   Financial Resource Strain: Not on file  Food Insecurity: Not on file  Transportation Needs: Not on file  Physical Activity: Not on file  Stress: Not on file  Social Connections: Not on file     Family History: The patient's family history includes Stroke in his brother.  ROS:   Please see the history of present illness.     All other systems reviewed and are negative.  EKGs/Labs/Other Studies Reviewed:    The following studies were reviewed today:   EKG:  EKG not ordered today.    Recent Labs: 01/13/2022: ALT 8; BUN 10; Creatinine, Ser 0.54;  Hemoglobin 14.8; Platelets 159; Potassium 3.9; Sodium 136  Recent Lipid Panel No results found for: "CHOL", "TRIG", "HDL", "CHOLHDL", "VLDL", "LDLCALC", "LDLDIRECT"  Outside lipid panel 02/2021 total cholesterol 154, triglyceride 99, LDL 88, HDL 46  Risk Assessment/Calculations:         Physical Exam:    VS:  BP (!) 90/50 (BP Location: Left Arm, Patient Position: Sitting, Cuff Size: Large)   Pulse 78   Ht 6' (1.829 m)   Wt 181 lb (82.1 kg)   SpO2 98%   BMI 24.55 kg/m     Wt Readings from Last 3 Encounters:  02/01/22  181 lb (82.1 kg)  01/21/22 183 lb (83 kg)  01/13/22 175 lb (79.4 kg)     GEN:  Well nourished, well developed in no acute distress HEENT: Normal NECK: No JVD; No carotid bruits CARDIAC: RRR, no murmurs, rubs, gallops RESPIRATORY: Decreased breath sounds bilaterally, no wheezing ABDOMEN: Soft, non-tender, non-distended MUSCULOSKELETAL:  No edema; No deformity  SKIN: Warm and dry NEUROLOGIC:  Alert and oriented x 3 PSYCHIATRIC:  Normal affect   ASSESSMENT:    1. Coronary artery disease involving native heart, unspecified vessel or lesion type, unspecified whether angina present     PLAN:    In order of problems listed above:  CAD, calcium score 2877, CCTA 01/31/2022 with CTO proximal RCA, severe LAD and left circumflex disease.  Echocardiogram was ordered and is currently pending.  Start aspirin 81 mg, Lipitor 20 mg.  Plan for left heart catheter [place] in case atherectomy is needed for LAD and left circumflex disease.  RCA appears chronically occluded.  Sublingual nitro as needed prescribed.  Follow-up in 6 weeks.     Shared Decision Making/Informed Consent The risks [stroke (1 in 1000), death (1 in 1000), kidney failure [usually temporary] (1 in 500), bleeding (1 in 200), allergic reaction [possibly serious] (1 in 200)], benefits (diagnostic support and management of coronary artery disease) and alternatives of a cardiac catheterization were discussed  in detail with Mr. Burback and he is willing to proceed.   Medication Adjustments/Labs and Tests Ordered: Current medicines are reviewed at length with the patient today.  Concerns regarding medicines are outlined above.  Orders Placed This Encounter  Procedures   Basic metabolic panel   Meds ordered this encounter  Medications   aspirin EC 81 MG tablet    Sig: Take 1 tablet (81 mg total) by mouth daily. Swallow whole.    Dispense:  90 tablet    Refill:  3   atorvastatin (LIPITOR) 20 MG tablet    Sig: Take 1 tablet (20 mg total) by mouth daily.    Dispense:  30 tablet    Refill:  3   nitroGLYCERIN (NITROSTAT) 0.4 MG SL tablet    Sig: Place 1 tablet (0.4 mg total) under the tongue every 5 (five) minutes as needed for chest pain. For a max of 3 doses in 1 day.    Dispense:  30 tablet    Refill:  0    Patient Instructions  Medication Instructions:   Your physician has recommended you make the following change in your medication:    START taking Aspirin 81 MG once a day.  2.    START taking Atorvastatin (Lipitor) 20 MG once a day.  3.    START taking Nitroglycerin 0.4 MG under the tongue as needed for chest pain. May repeat for a max of 3 doses every 5 minutes.    *If you need a refill on your cardiac medications before your next appointment, please call your pharmacy*   Lab Work:  [facility] after your office visit today for a Lab (BMP) draw.   Testing/Procedures:   You are scheduled for a Cardiac Catheterization on Wednesday, July 12 with Dr. Muhammad Arida.  1. Please arrive at the Main Entrance A at [facility]: [address] [place], [place] [address] at 10:00 AM (This time is two hours before your procedure to ensure your preparation). Free valet parking service is available.   Special note: Every effort is made to have your procedure done   on time. Please understand that emergencies sometimes delay scheduled procedures.  2. Diet: Do  not eat solid foods after midnight.  You may have clear liquids until 5 AM upon the day of the procedure.  3. Labs: BMP drawn on 02/01/22 (needed repeat draw after contrast on 01/31/22), CBC drawn on 01/13/22  4. Medication instructions in preparation for your procedure:   Contrast Allergy: No  On the morning of your procedure, take Aspirin 81 MG and any morning medicines NOT listed above.  You may use sips of water.  5. Plan to go home the same day, you will only stay overnight if medically necessary. 6. You MUST have a responsible adult to drive you home. 7. An adult MUST be with you the first 24 hours after you arrive home. 8. Bring a current list of your medications, and the last time and date medication taken. 9. Bring ID and current insurance cards. 10.Please wear clothes that are easy to get on and off and wear slip-on shoes.  Thank you for allowing [place] to care for you!   -- [place] [facility]    Follow-Up: At [facility], you and your health needs are our priority.  As part of our continuing mission to provide you with exceptional heart care, we have created designated Provider Care Teams.  These Care Teams include your primary Cardiologist (physician) and Advanced Practice Providers (APPs -  Physician Assistants and Nurse Practitioners) who all work together to provide you with the care you need, when you need it.  We recommend signing up for the patient portal called "MyChart".  Sign up information is provided on this After Visit Summary.  MyChart is used to connect with patients for Virtual Visits (Telemedicine).  Patients are able to view lab/test results, encounter notes, upcoming appointments, etc.  Non-urgent messages can be sent to your provider as well.   To learn more about what you can do with MyChart, go to [email or web address].    Your next appointment:   6 week(s)  The format for your next appointment:   In Person  Provider:   Debbe Odea, MD    Other Instructions   Important Information About Sugar         Signed, Debbe Odea, MD  02/01/2022 9:57 AM    [facility]

## 2022-02-01 NOTE — Progress Notes (Addendum)
Cardiology Office Note:    Date:  02/01/2022   ID:  Nicolas Solis, DOB December 29, 1950, MRN [id number]  PCP:  Barbette Reichmann, MD   [facility] Providers Cardiologist:  None     Referring MD: Barbette Reichmann, MD   Chief Complaint  Patient presents with   Other    F/u CTA. Meds reviewed verbally with pt.    History of Present Illness:    Nicolas Solis is a 71 y.o. male with a hx of CAD (CCTA 01/31/2022 CTO RCA, sig stenosis in prox LAD and Lcx)Parkinson's disease, former smoker x50+ years presents for follow-up.  Previously seen due to chest pain.    Underwent coronary CTA 01/31/2022 showing significant three-vessel disease with CTO of RCA, severe stenosis in LAD and left circumflex.  States doing okay, has some fatigue when he overexerts himself.  No recent chest pain noted.  Echocardiogram was ordered but is currently pending.   Past Medical History:  Diagnosis Date   Compression fracture of first cervical vertebra (HCC)    Essential tremor    Ileus (HCC)     Past Surgical History:  Procedure Laterality Date   25 GAUGE PARS PLANA VITRECTOMY WITH 20 GAUGE MVR PORT Right 03/18/2017   25 GAUGE PARS PLANA VITRECTOMY WITH 20 GAUGE MVR PORT FOR MACULAR HOLE Right 03/18/2017   Procedure: 25 GAUGE PARS PLANA VITRECTOMY WITH 20 GAUGE MVR PORT FOR MACULAR HOLE; PHOTOCOAGULATION RIGHT EYE;  Surgeon: Sherrie George, MD;  Location: [facility] OR;  Service: Ophthalmology;  Laterality: Right;   APPENDECTOMY     COLONOSCOPY W/ POLYPECTOMY     GAS INSERTION Right 03/18/2017   Procedure: INSERTION OF GAS RIGHT (C3F8);  Surgeon: Sherrie George, MD;  Location: [facility] OR;  Service: Ophthalmology;  Laterality: Right;   GAS/FLUID EXCHANGE Right 03/18/2017   Procedure: GAS/FLUID EXCHANGE RIGHT EYE;  Surgeon: Sherrie George, MD;  Location: [facility] OR;  Service: Ophthalmology;  Laterality: Right;   JOINT REPLACEMENT Right    Hip   MEMBRANE PEEL Right 03/18/2017   Procedure: MEMBRANE PEEL RIGHT EYE;   Surgeon: Sherrie George, MD;  Location: [facility] OR;  Service: Ophthalmology;  Laterality: Right;   MOUTH SURGERY     PHOTOCOAGULATION Left 03/18/2017   Procedure: PHOTOCOAGULATION LEFT EYE;  Surgeon: Sherrie George, MD;  Location: [facility] OR;  Service: Ophthalmology;  Laterality: Left;   SERUM PATCH Right 03/18/2017   Procedure: SERUM PATCH RIGHT EYE;  Surgeon: Sherrie George, MD;  Location: [facility] OR;  Service: Ophthalmology;  Laterality: Right;    Current Medications: Current Meds  Medication Sig   aspirin EC 81 MG tablet Take 1 tablet (81 mg total) by mouth daily. Swallow whole.   atorvastatin (LIPITOR) 20 MG tablet Take 1 tablet (20 mg total) by mouth daily.   bacitracin-polymyxin b (POLYSPORIN) ophthalmic ointment Place 1 application into the right eye 3 (three) times daily. apply to eye every 12 hours while awake   brimonidine (ALPHAGAN) 0.2 % ophthalmic solution Place 1 drop into the right eye 2 (two) times daily.   carbidopa-levodopa (SINEMET IR) 25-100 MG tablet Take 2 tablets by mouth 3 (three) times daily.   Carbidopa-Levodopa ER (SINEMET CR) 25-100 MG tablet controlled release Take 1 tablet by mouth at bedtime.   dorzolamide-timolol (COSOPT) 22.3-6.8 MG/ML ophthalmic solution Place 1 drop into both eyes daily.   gatifloxacin (ZYMAXID) 0.5 % SOLN Place 1 drop into the right eye 4 (four) times daily.   HYDROcodone-acetaminophen (NORCO/VICODIN) 5-325 MG tablet  Take 1-2 tablets by mouth every 4 (four) hours as needed for moderate pain.   ibuprofen (ADVIL,MOTRIN) 200 MG tablet Take 600 mg by mouth daily as needed for headache or moderate pain.   Multiple Vitamin (MULTIVITAMIN WITH MINERALS) TABS tablet Take 2 tablets by mouth daily.   nicotine polacrilex (NICORETTE) 2 MG gum Take 0.25 each by mouth as needed for smoking cessation.   nitroGLYCERIN (NITROSTAT) 0.4 MG SL tablet Place 1 tablet (0.4 mg total) under the tongue every 5 (five) minutes as needed for chest pain. For a max of 3 doses in 1  day.   Omega-3 Fatty Acids (FISH OIL PO) Take 1 capsule by mouth daily.   prednisoLONE acetate (PRED FORTE) 1 % ophthalmic suspension Place 1 drop into the right eye 4 (four) times daily.   primidone (MYSOLINE) 50 MG tablet Take 50 mg by mouth every evening.    tamsulosin (FLOMAX) 0.4 MG CAPS capsule Take 0.4 mg by mouth daily.   vitamin B-12 (CYANOCOBALAMIN) 500 MCG tablet Take 500 mcg by mouth daily.   [DISCONTINUED] metoprolol tartrate (LOPRESSOR) 100 MG tablet Take 1 tablet (100 mg total) by mouth once for 1 dose. Take 2 hours prior to your CT scan.     Allergies:   Patient has no known allergies.   Social History   Socioeconomic History   Marital status: Married    Spouse name: Not on file   Number of children: Not on file   Years of education: Not on file   Highest education level: Not on file  Occupational History   Not on file  Tobacco Use   Smoking status: Former    Years: 50.00    Types: Cigarettes    Quit date: 10/2015    Years since quitting: 6.2   Smokeless tobacco: Never  Vaping Use   Vaping Use: Never used  Substance and Sexual Activity   Alcohol use: Yes    Alcohol/week: 7.0 standard drinks of alcohol    Types: 7 Shots of liquor per week   Drug use: No   Sexual activity: Not on file  Other Topics Concern   Not on file  Social History Narrative   Not on file   Social Determinants of Health   Financial Resource Strain: Not on file  Food Insecurity: Not on file  Transportation Needs: Not on file  Physical Activity: Not on file  Stress: Not on file  Social Connections: Not on file     Family History: The patient's family history includes Stroke in his brother.  ROS:   Please see the history of present illness.     All other systems reviewed and are negative.  EKGs/Labs/Other Studies Reviewed:    The following studies were reviewed today:   EKG:  EKG not ordered today.    Recent Labs: 01/13/2022: ALT 8; BUN 10; Creatinine, Ser 0.54;  Hemoglobin 14.8; Platelets 159; Potassium 3.9; Sodium 136  Recent Lipid Panel No results found for: "CHOL", "TRIG", "HDL", "CHOLHDL", "VLDL", "LDLCALC", "LDLDIRECT"  Outside lipid panel 02/2021 total cholesterol 154, triglyceride 99, LDL 88, HDL 46  Risk Assessment/Calculations:         Physical Exam:    VS:  BP (!) 90/50 (BP Location: Left Arm, Patient Position: Sitting, Cuff Size: Large)   Pulse 78   Ht 6' (1.829 m)   Wt 181 lb (82.1 kg)   SpO2 98%   BMI 24.55 kg/m     Wt Readings from Last 3 Encounters:  02/01/22  181 lb (82.1 kg)  01/21/22 183 lb (83 kg)  01/13/22 175 lb (79.4 kg)     GEN:  Well nourished, well developed in no acute distress HEENT: Normal NECK: No JVD; No carotid bruits CARDIAC: RRR, no murmurs, rubs, gallops RESPIRATORY: Decreased breath sounds bilaterally, no wheezing ABDOMEN: Soft, non-tender, non-distended MUSCULOSKELETAL:  No edema; No deformity  SKIN: Warm and dry NEUROLOGIC:  Alert and oriented x 3 PSYCHIATRIC:  Normal affect   ASSESSMENT:    1. Coronary artery disease involving native heart, unspecified vessel or lesion type, unspecified whether angina present     PLAN:    In order of problems listed above:  CAD, calcium score 2877, CCTA 01/31/2022 with CTO proximal RCA, severe LAD and left circumflex disease.  Echocardiogram was ordered and is currently pending.  Start aspirin 81 mg, Lipitor 20 mg.  Plan for left heart catheter [place] in case atherectomy is needed for LAD and left circumflex disease.  RCA appears chronically occluded.  Sublingual nitro as needed prescribed.  Follow-up in 6 weeks.     Shared Decision Making/Informed Consent The risks [stroke (1 in 1000), death (1 in 1000), kidney failure [usually temporary] (1 in 500), bleeding (1 in 200), allergic reaction [possibly serious] (1 in 200)], benefits (diagnostic support and management of coronary artery disease) and alternatives of a cardiac catheterization were discussed  in detail with Nicolas Solis and he is willing to proceed.   Medication Adjustments/Labs and Tests Ordered: Current medicines are reviewed at length with the patient today.  Concerns regarding medicines are outlined above.  Orders Placed This Encounter  Procedures   Basic metabolic panel   Meds ordered this encounter  Medications   aspirin EC 81 MG tablet    Sig: Take 1 tablet (81 mg total) by mouth daily. Swallow whole.    Dispense:  90 tablet    Refill:  3   atorvastatin (LIPITOR) 20 MG tablet    Sig: Take 1 tablet (20 mg total) by mouth daily.    Dispense:  30 tablet    Refill:  3   nitroGLYCERIN (NITROSTAT) 0.4 MG SL tablet    Sig: Place 1 tablet (0.4 mg total) under the tongue every 5 (five) minutes as needed for chest pain. For a max of 3 doses in 1 day.    Dispense:  30 tablet    Refill:  0    Patient Instructions  Medication Instructions:   Your physician has recommended you make the following change in your medication:    START taking Aspirin 81 MG once a day.  2.    START taking Atorvastatin (Lipitor) 20 MG once a day.  3.    START taking Nitroglycerin 0.4 MG under the tongue as needed for chest pain. May repeat for a max of 3 doses every 5 minutes.    *If you need a refill on your cardiac medications before your next appointment, please call your pharmacy*   Lab Work:  Please go to the [facility] after your office visit today for a Lab (BMP) draw.   Testing/Procedures:   You are scheduled for a Cardiac Catheterization on Wednesday, July 12 with Dr. Lorine Bears.  1. Please arrive at the Main Entrance A at [facility]: [address] [place], [place] [address] at 10:00 AM (This time is two hours before your procedure to ensure your preparation). Free valet parking service is available.   Special note: Every effort is made to have your procedure done  on time. Please understand that emergencies sometimes delay scheduled procedures.  2. Diet: Do  not eat solid foods after midnight.  You may have clear liquids until 5 AM upon the day of the procedure.  3. Labs: BMP drawn on 02/01/22 (needed repeat draw after contrast on 01/31/22), CBC drawn on 01/13/22  4. Medication instructions in preparation for your procedure:   Contrast Allergy: No  On the morning of your procedure, take Aspirin 81 MG and any morning medicines NOT listed above.  You may use sips of water.  5. Plan to go home the same day, you will only stay overnight if medically necessary. 6. You MUST have a responsible adult to drive you home. 7. An adult MUST be with you the first 24 hours after you arrive home. 8. Bring a current list of your medications, and the last time and date medication taken. 9. Bring ID and current insurance cards. 10.Please wear clothes that are easy to get on and off and wear slip-on shoes.  Thank you for allowing [place] to care for you!   -- [place] [facility]    Follow-Up: At [facility], you and your health needs are our priority.  As part of our continuing mission to provide you with exceptional heart care, we have created designated Provider Care Teams.  These Care Teams include your primary Cardiologist (physician) and Advanced Practice Providers (APPs -  Physician Assistants and Nurse Practitioners) who all work together to provide you with the care you need, when you need it.  We recommend signing up for the patient portal called "MyChart".  Sign up information is provided on this After Visit Summary.  MyChart is used to connect with patients for Virtual Visits (Telemedicine).  Patients are able to view lab/test results, encounter notes, upcoming appointments, etc.  Non-urgent messages can be sent to your provider as well.   To learn more about what you can do with MyChart, go to [email or web address].    Your next appointment:   6 week(s)  The format for your next appointment:   In Person  Provider:   Debbe Odea, MD    Other Instructions   Important Information About Sugar         Signed, Debbe Odea, MD  02/01/2022 9:57 AM    [facility]

## 2022-02-05 ENCOUNTER — Telehealth: Payer: Self-pay | Admitting: *Deleted

## 2022-02-05 NOTE — Telephone Encounter (Signed)
Cardiac Catheterization scheduled at [facility] for: Wednesday February 06, 2022 2 PM Arrival time and place: [facility] Main Entrance A at 12 Noon   Nothing to eat after midnight prior to procedure, clear liquids until 5 AM day of procedure.  Medication instructions: -Usual morning medications can be taken with sips of water including aspirin 81 mg.  Confirmed patient has responsible adult to drive home post procedure and be with patient first 24 hours after arriving home.  Patient reports no new symptoms concerning for COVID-19 in the past 10 days.  Reviewed procedure instructions with patient.

## 2022-02-06 ENCOUNTER — Other Ambulatory Visit: Payer: Self-pay

## 2022-02-06 ENCOUNTER — Encounter (HOSPITAL_COMMUNITY)
Admission: RE | Disposition: A | Discharge status: Home / Self Care | Payer: Self-pay | Source: Home / Self Care | Attending: Cardiovascular Disease

## 2022-02-06 ENCOUNTER — Ambulatory Visit (HOSPITAL_COMMUNITY)
Admission: RE | Admit: 2022-02-06 | Discharge: 2022-02-06 | Disposition: A | Discharge status: Home / Self Care | Payer: Medicare Other | Attending: Cardiovascular Disease | Admitting: Cardiovascular Disease

## 2022-02-06 DIAGNOSIS — I2584 Coronary atherosclerosis due to calcified coronary lesion: Secondary | ICD-10-CM | POA: Diagnosis not present

## 2022-02-06 DIAGNOSIS — I2582 Chronic total occlusion of coronary artery: Secondary | ICD-10-CM | POA: Insufficient documentation

## 2022-02-06 DIAGNOSIS — Z87891 Personal history of nicotine dependence: Secondary | ICD-10-CM | POA: Diagnosis not present

## 2022-02-06 DIAGNOSIS — I25118 Atherosclerotic heart disease of native coronary artery with other forms of angina pectoris: Secondary | ICD-10-CM

## 2022-02-06 DIAGNOSIS — G20 Parkinson's disease: Secondary | ICD-10-CM | POA: Insufficient documentation

## 2022-02-06 DIAGNOSIS — I2510 Atherosclerotic heart disease of native coronary artery without angina pectoris: Secondary | ICD-10-CM

## 2022-02-06 HISTORY — PX: LEFT HEART CATH AND CORONARY ANGIOGRAPHY: CATH118249

## 2022-02-06 SURGERY — LEFT HEART CATH AND CORONARY ANGIOGRAPHY
Anesthesia: LOCAL

## 2022-02-06 MED ORDER — IOHEXOL 350 MG/ML SOLN
INTRAVENOUS | Status: DC | PRN
  Administered 2022-02-06: 40 mL

## 2022-02-06 MED ORDER — ASPIRIN 81 MG PO CHEW: 81.0000 mg | CHEWABLE_TABLET | ORAL | Status: DC

## 2022-02-06 MED ORDER — FENTANYL CITRATE (PF) 100 MCG/2ML IJ SOLN
INTRAMUSCULAR | Status: AC
  Filled 2022-02-06: qty 2

## 2022-02-06 MED ORDER — SODIUM CHLORIDE 0.9% FLUSH: 3.0000 mL | INTRAVENOUS | Status: DC | PRN

## 2022-02-06 MED ORDER — ACETAMINOPHEN 325 MG PO TABS: 650.0000 mg | ORAL_TABLET | ORAL | Status: DC | PRN

## 2022-02-06 MED ORDER — VERAPAMIL HCL 2.5 MG/ML IV SOLN
INTRAVENOUS | Status: AC
  Filled 2022-02-06: qty 2

## 2022-02-06 MED ORDER — MIDAZOLAM HCL 2 MG/2ML IJ SOLN
INTRAMUSCULAR | Status: AC
  Filled 2022-02-06: qty 2

## 2022-02-06 MED ORDER — SODIUM CHLORIDE 0.9% FLUSH
3.0000 mL | Freq: Two times a day (BID) | INTRAVENOUS | Status: DC
Start: 2022-02-06 — End: 2022-02-06

## 2022-02-06 MED ORDER — LIDOCAINE HCL (PF) 1 % IJ SOLN
INTRAMUSCULAR | Status: AC
  Filled 2022-02-06: qty 30

## 2022-02-06 MED ORDER — SODIUM CHLORIDE 0.9 % IV SOLN: 250.0000 mL | INTRAVENOUS | Status: DC | PRN

## 2022-02-06 MED ORDER — SODIUM CHLORIDE 0.9% FLUSH: 3.0000 mL | Freq: Two times a day (BID) | INTRAVENOUS | Status: DC

## 2022-02-06 MED ORDER — HEPARIN (PORCINE) IN NACL 1000-0.9 UT/500ML-% IV SOLN
INTRAVENOUS | Status: DC | PRN
  Administered 2022-02-06 (×2): 500 mL

## 2022-02-06 MED ORDER — HEPARIN SODIUM (PORCINE) 1000 UNIT/ML IJ SOLN
INTRAMUSCULAR | Status: AC
  Filled 2022-02-06: qty 10

## 2022-02-06 MED ORDER — SODIUM CHLORIDE 0.9 % IV SOLN
INTRAVENOUS | Status: DC
Start: 2022-02-06 — End: 2022-02-06

## 2022-02-06 MED ORDER — HEPARIN SODIUM (PORCINE) 1000 UNIT/ML IJ SOLN
INTRAMUSCULAR | Status: DC | PRN
  Administered 2022-02-06: 4000 [IU] via INTRAVENOUS

## 2022-02-06 MED ORDER — MIDAZOLAM HCL 2 MG/2ML IJ SOLN
INTRAMUSCULAR | Status: DC | PRN
  Administered 2022-02-06: 1 mg via INTRAVENOUS

## 2022-02-06 MED ORDER — SODIUM CHLORIDE 0.9 % WEIGHT BASED INFUSION
3.0000 mL/kg/h | INTRAVENOUS | Status: AC
  Administered 2022-02-06: 3 mL/kg/h via INTRAVENOUS

## 2022-02-06 MED ORDER — SODIUM CHLORIDE 0.9 % WEIGHT BASED INFUSION
1.0000 mL/kg/h | INTRAVENOUS | Status: DC
Start: 2022-02-06 — End: 2022-02-06

## 2022-02-06 MED ORDER — SODIUM CHLORIDE 0.9% FLUSH
3.0000 mL | INTRAVENOUS | Status: DC | PRN
Start: 2022-02-06 — End: 2022-02-06

## 2022-02-06 MED ORDER — LIDOCAINE HCL (PF) 1 % IJ SOLN
INTRAMUSCULAR | Status: DC | PRN
  Administered 2022-02-06: 2 mL via SUBCUTANEOUS

## 2022-02-06 MED ORDER — ONDANSETRON HCL 4 MG/2ML IJ SOLN: 4.0000 mg | Freq: Four times a day (QID) | INTRAMUSCULAR | Status: DC | PRN

## 2022-02-06 MED ORDER — VERAPAMIL HCL 2.5 MG/ML IV SOLN
INTRAVENOUS | Status: DC | PRN
  Administered 2022-02-06: 10 mL via INTRA_ARTERIAL

## 2022-02-06 MED ORDER — FENTANYL CITRATE (PF) 100 MCG/2ML IJ SOLN
INTRAMUSCULAR | Status: DC | PRN
  Administered 2022-02-06: 25 ug via INTRAVENOUS

## 2022-02-06 MED ORDER — HEPARIN (PORCINE) IN NACL 1000-0.9 UT/500ML-% IV SOLN
INTRAVENOUS | Status: AC
  Filled 2022-02-06: qty 1000

## 2022-02-06 SURGICAL SUPPLY — 10 items
BAND ZEPHYR COMPRESS 30 LONG (HEMOSTASIS) ×1 IMPLANT
CATH INFINITI 5FR JK (CATHETERS) ×1 IMPLANT
GLIDESHEATH SLEND SS 6F .021 (SHEATH) ×1 IMPLANT
GUIDEWIRE INQWIRE 1.5J.035X260 (WIRE) IMPLANT
INQWIRE 1.5J .035X260CM (WIRE) ×4
KIT HEART LEFT (KITS) ×2 IMPLANT
PACK CARDIAC CATHETERIZATION (CUSTOM PROCEDURE TRAY) ×2 IMPLANT
SYR MEDRAD MARK 7 150ML (SYRINGE) ×2 IMPLANT
TRANSDUCER W/STOPCOCK (MISCELLANEOUS) ×2 IMPLANT
TUBING CIL FLEX 10 FLL-RA (TUBING) ×2 IMPLANT

## 2022-02-06 NOTE — Progress Notes (Signed)
Pt ambulated without difficulty or bleeding.   Discharged home with wife who will drive and stay with pt x 24 hrs 

## 2022-02-06 NOTE — Interval H&P Note (Signed)
Cath Lab Visit (complete for each Cath Lab visit)  Clinical Evaluation Leading to the Procedure:   ACS: No.  Non-ACS:    Anginal Classification: CCS II  Anti-ischemic medical therapy: No Therapy  Non-Invasive Test Results: High-risk stress test findings: cardiac mortality >3%/year  Prior CABG: No previous CABG      History and Physical Interval Note:  02/06/2022 1:47 PM  Nicolas Solis  has presented today for surgery, with the diagnosis of abnormal coronary CTA with 3 vessel disease.  The various methods of treatment have been discussed with the patient and family. After consideration of risks, benefits and other options for treatment, the patient has consented to  Procedure(s): LEFT HEART CATH AND CORONARY ANGIOGRAPHY (N/A) as a surgical intervention.  The patient's history has been reviewed, patient examined, no change in status, stable for surgery.  I have reviewed the patient's chart and labs.  Questions were answered to the patient's satisfaction.     Lorine Bears

## 2022-02-06 NOTE — Discharge Instructions (Signed)
Radial Site Care  This sheet gives you information about how to care for yourself after your procedure. Your health care provider may also give you more specific instructions. If you have problems or questions, contact your health care provider. What can I expect after the procedure? After the procedure, it is common to have: Bruising and tenderness at the catheter insertion area. Follow these instructions at home: Medicines Take over-the-counter and prescription medicines only as told by your health care provider. Insertion site care Follow instructions from your health care provider about how to take care of your insertion site. Make sure you: Wash your hands with soap and water before you remove your bandage (dressing). If soap and water are not available, use hand sanitizer. May remove dressing in 24 hours. Check your insertion site every day for signs of infection. Check for: Redness, swelling, or pain. Fluid or blood. Pus or a bad smell. Warmth. Do no take baths, swim, or use a hot tub for 5 days. You may shower 24-48 hours after the procedure. Remove the dressing and gently wash the site with plain soap and water. Pat the area dry with a clean towel. Do not rub the site. That could cause bleeding. Do not apply powder or lotion to the site. Activity  For 24 hours after the procedure, or as directed by your health care provider: Do not flex or bend the affected arm. Do not push or pull heavy objects with the affected arm. Do not drive yourself home from the hospital or clinic. You may drive 24 hours after the procedure. Do not operate machinery or power tools. KEEP ARM ELEVATED THE REMAINDER OF THE DAY. Do not push, pull or lift anything that is heavier than 10 lb for 5 days. Ask your health care provider when it is okay to: Return to work or school. Resume usual physical activities or sports. Resume sexual activity. General instructions If the catheter site starts to  bleed, raise your arm and put firm pressure on the site. If the bleeding does not stop, get help right away. This is a medical emergency. DRINK PLENTY OF FLUIDS FOR THE NEXT 2-3 DAYS. No alcohol consumption for 24 hours after receiving sedation. If you went home on the same day as your procedure, a responsible adult should be with you for the first 24 hours after you arrive home. Keep all follow-up visits as told by your health care provider. This is important. Contact a health care provider if: You have a fever. You have redness, swelling, or yellow drainage around your insertion site. Get help right away if: You have unusual pain at the radial site. The catheter insertion area swells very fast. The insertion area is bleeding, and the bleeding does not stop when you hold steady pressure on the area. Your arm or hand becomes pale, cool, tingly, or numb. These symptoms may represent a serious problem that is an emergency. Do not wait to see if the symptoms will go away. Get medical help right away. Call your local emergency services (911 in the [place] Do not drive yourself to the hospital. Summary After the procedure, it is common to have bruising and tenderness at the site. Follow instructions from your health care provider about how to take care of your radial site wound. Check the wound every day for signs of infection.  This information is not intended to replace advice given to you by your health care provider. Make sure you discuss any questions you have with   your health care provider. Document Revised: 08/20/2017 Document Reviewed: 08/20/2017 Elsevier Patient Education  2020 Elsevier Inc.  

## 2022-02-07 ENCOUNTER — Encounter (HOSPITAL_COMMUNITY): Payer: Self-pay | Admitting: Cardiovascular Disease

## 2022-02-11 ENCOUNTER — Ambulatory Visit (HOSPITAL_COMMUNITY): Payer: Medicare Other | Attending: Internal Medicine

## 2022-02-11 DIAGNOSIS — R072 Precordial pain: Secondary | ICD-10-CM | POA: Insufficient documentation

## 2022-02-11 LAB — ECHOCARDIOGRAM COMPLETE
AR max vel: 3.02 cm2
AV Area VTI: 2.99 cm2
AV Area mean vel: 3.16 cm2
AV Mean grad: 4 mmHg
AV Peak grad: 9.4 mmHg
Ao pk vel: 1.53 m/s
Area-P 1/2: 1.87 cm2
Calc EF: 64.5 %
MV VTI: 2.22 cm2
S' Lateral: 2.2 cm
Single Plane A2C EF: 70.9 %
Single Plane A4C EF: 54.7 %

## 2022-02-21 ENCOUNTER — Other Ambulatory Visit: Payer: Medicare Other

## 2022-03-22 ENCOUNTER — Encounter: Payer: Self-pay | Admitting: Cardiology

## 2022-03-22 ENCOUNTER — Ambulatory Visit (INDEPENDENT_AMBULATORY_CARE_PROVIDER_SITE_OTHER): Payer: Medicare Other | Admitting: Cardiology

## 2022-03-22 VITALS — BP 98/60 | HR 82 | Ht 72.0 in | Wt 178.2 lb

## 2022-03-22 DIAGNOSIS — I2510 Atherosclerotic heart disease of native coronary artery without angina pectoris: Secondary | ICD-10-CM

## 2022-03-22 MED ORDER — RANOLAZINE ER 500 MG PO TB12: 500.0000 mg | ORAL_TABLET | Freq: Two times a day (BID) | ORAL | 3 refills | Status: DC

## 2022-03-22 MED ORDER — NITROGLYCERIN 0.4 MG SL SUBL: 0.4000 mg | SUBLINGUAL_TABLET | SUBLINGUAL | 0 refills | Status: DC | PRN

## 2022-03-22 NOTE — Patient Instructions (Signed)
Medication Instructions:   Your physician has recommended you make the following change in your medication:    START Ranolazine (Ranexa) 500 MG twice a day.   *If you need a refill on your cardiac medications before your next appointment, please call your pharmacy*   Follow-Up: At [facility], you and your health needs are our priority.  As part of our continuing mission to provide you with exceptional heart care, we have created designated Provider Care Teams.  These Care Teams include your primary Cardiologist (physician) and Advanced Practice Providers (APPs -  Physician Assistants and Nurse Practitioners) who all work together to provide you with the care you need, when you need it.  We recommend signing up for the patient portal called "MyChart".  Sign up information is provided on this After Visit Summary.  MyChart is used to connect with patients for Virtual Visits (Telemedicine).  Patients are able to view lab/test results, encounter notes, upcoming appointments, etc.  Non-urgent messages can be sent to your provider as well.   To learn more about what you can do with MyChart, go to [email or web address].    Your next appointment:   6 month(s)  The format for your next appointment:   In Person  Provider:   You may see Debbe Odea, MD or one of the following Advanced Practice Providers on your designated Care Team:   Nicolasa Ducking, NP Eula Listen, PA-C Cadence Fransico Michael, [place]    Other Instructions   Important Information About Sugar

## 2022-03-22 NOTE — Progress Notes (Signed)
Cardiology Office Note:    Date:  03/22/2022   ID:  DOMNICK CHERVENAK, DOB [birth year], MRN [id number]  PCP:  Barbette Reichmann, MD   [facility] Providers Cardiologist:  None     Referring MD: Barbette Reichmann, MD   Chief Complaint  Patient presents with   Other    Post cath. Meds reviewed verbally with pt.    History of Present Illness:    Nicolas Solis is a 71 y.o. male with a hx of CAD (CTO RCA, mild to moderate LAD, left circumflex ), Parkinson's disease, former smoker x50+ years presents for follow-up.   Previously seen for chest pain, coronary CTA was abnormal with CTO RCA, left heart cath confirmed CTO RCA, mild to moderate left circumflex and LAD disease.  States doing okay otherwise, has occasional nonspecific chest pain.  Was given sublingual nitroglycerin, has not needed to use this yet.   Echocardiogram 01/2022 EF 70 to 75% Left heart cath 01/2022 CTO RCA, mild to moderate LAD and LCx    Past Medical History:  Diagnosis Date   Compression fracture of first cervical vertebra (HCC)    Essential tremor    Ileus (HCC)     Past Surgical History:  Procedure Laterality Date   25 GAUGE PARS PLANA VITRECTOMY WITH 20 GAUGE MVR PORT Right 03/18/2017   25 GAUGE PARS PLANA VITRECTOMY WITH 20 GAUGE MVR PORT FOR MACULAR HOLE Right 03/18/2017   Procedure: 25 GAUGE PARS PLANA VITRECTOMY WITH 20 GAUGE MVR PORT FOR MACULAR HOLE; PHOTOCOAGULATION RIGHT EYE;  Surgeon: Sherrie George, MD;  Location: [facility] OR;  Service: Ophthalmology;  Laterality: Right;   APPENDECTOMY     COLONOSCOPY W/ POLYPECTOMY     GAS INSERTION Right 03/18/2017   Procedure: INSERTION OF GAS RIGHT (C3F8);  Surgeon: Sherrie George, MD;  Location: [facility] OR;  Service: Ophthalmology;  Laterality: Right;   GAS/FLUID EXCHANGE Right 03/18/2017   Procedure: GAS/FLUID EXCHANGE RIGHT EYE;  Surgeon: Sherrie George, MD;  Location: [facility] OR;  Service: Ophthalmology;  Laterality: Right;   JOINT REPLACEMENT Right    Hip    LEFT HEART CATH AND CORONARY ANGIOGRAPHY N/A 02/06/2022   Procedure: LEFT HEART CATH AND CORONARY ANGIOGRAPHY;  Surgeon: Iran Ouch, MD;  Location: [facility] INVASIVE CV LAB;  Service: Cardiovascular;  Laterality: N/A;   MEMBRANE PEEL Right 03/18/2017   Procedure: MEMBRANE PEEL RIGHT EYE;  Surgeon: Sherrie George, MD;  Location: [facility] OR;  Service: Ophthalmology;  Laterality: Right;   MOUTH SURGERY     PHOTOCOAGULATION Left 03/18/2017   Procedure: PHOTOCOAGULATION LEFT EYE;  Surgeon: Sherrie George, MD;  Location: [facility] OR;  Service: Ophthalmology;  Laterality: Left;   SERUM PATCH Right 03/18/2017   Procedure: SERUM PATCH RIGHT EYE;  Surgeon: Sherrie George, MD;  Location: [facility] OR;  Service: Ophthalmology;  Laterality: Right;    Current Medications: Current Meds  Medication Sig   acetaminophen (TYLENOL) 500 MG tablet Take 1,000 mg by mouth every 6 (six) hours as needed (for pain.).   aspirin EC 81 MG tablet Take 1 tablet (81 mg total) by mouth daily. Swallow whole.   atorvastatin (LIPITOR) 20 MG tablet Take 1 tablet (20 mg total) by mouth daily. (Patient taking differently: Take 20 mg by mouth daily after supper.)   carbidopa-levodopa (SINEMET IR) 25-100 MG tablet Take 2 tablets by mouth 3 (three) times daily before meals.   Carbidopa-Levodopa ER (SINEMET CR) 25-100 MG tablet controlled release Take 1 tablet by mouth at  bedtime.   Cholecalciferol (VITAMIN D3) 50 MCG (2000 UT) TABS Take 2,000 Units by mouth in the morning.   Cyanocobalamin (VITAMIN B-12) 5000 MCG LOZG Take 2,500 mcg by mouth every evening.   dorzolamide-timolol (COSOPT) 22.3-6.8 MG/ML ophthalmic solution Place 1 drop into both eyes daily.   ibuprofen (ADVIL,MOTRIN) 200 MG tablet Take 400-600 mg by mouth daily as needed for headache or moderate pain.   Multiple Vitamins-Minerals (ADULT ONE DAILY GUMMIES PO) Take 2 tablets by mouth every evening.   nicotine polacrilex (NICORETTE) 2 MG gum Take 0.25 each by mouth every 2 (two) hours  as needed for smoking cessation.   nystatin powder Apply 1 Application topically as needed (irritation).   Omega-3 Fatty Acids (FISH OIL PO) Take 1,000 mg by mouth in the morning.   ranolazine (RANEXA) 500 MG 12 hr tablet Take 1 tablet (500 mg total) by mouth 2 (two) times daily.   tamsulosin (FLOMAX) 0.4 MG CAPS capsule Take 0.4 mg by mouth daily after supper.   [DISCONTINUED] nitroGLYCERIN (NITROSTAT) 0.4 MG SL tablet Place 1 tablet (0.4 mg total) under the tongue every 5 (five) minutes as needed for chest pain. For a max of 3 doses in 1 day.     Allergies:   Patient has no known allergies.   Social History   Socioeconomic History   Marital status: Married    Spouse name: Not on file   Number of children: Not on file   Years of education: Not on file   Highest education level: Not on file  Occupational History   Not on file  Tobacco Use   Smoking status: Former    Years: 50.00    Types: Cigarettes    Quit date: 10/2015    Years since quitting: 6.4   Smokeless tobacco: Never  Vaping Use   Vaping Use: Never used  Substance and Sexual Activity   Alcohol use: Yes    Alcohol/week: 7.0 standard drinks of alcohol    Types: 7 Shots of liquor per week   Drug use: No   Sexual activity: Not on file  Other Topics Concern   Not on file  Social History Narrative   Not on file   Social Determinants of Health   Financial Resource Strain: Not on file  Food Insecurity: Not on file  Transportation Needs: Not on file  Physical Activity: Not on file  Stress: Not on file  Social Connections: Not on file     Family History: The patient's family history includes Stroke in his brother.  ROS:   Please see the history of present illness.     All other systems reviewed and are negative.  EKGs/Labs/Other Studies Reviewed:    The following studies were reviewed today:   EKG:  EKG is ordered today.  EKG shows normal sinus rhythm, right bundle branch block.  Recent Labs: 01/13/2022:  ALT 8; Hemoglobin 14.8; Platelets 159 02/01/2022: BUN 11; Creatinine, Ser 0.62; Potassium 3.6; Sodium 135  Recent Lipid Panel No results found for: "CHOL", "TRIG", "HDL", "CHOLHDL", "VLDL", "LDLCALC", "LDLDIRECT"  Outside lipid panel 02/2021 total cholesterol 154, triglyceride 99, LDL 88, HDL 46  Risk Assessment/Calculations:         Physical Exam:    VS:  BP 98/60 (BP Location: Left Arm, Patient Position: Sitting, Cuff Size: Normal)   Pulse 82   Ht 6' (1.829 m)   Wt 178 lb 4 oz (80.9 kg)   SpO2 95%   BMI 24.18 kg/m  Wt Readings from Last 3 Encounters:  03/22/22 178 lb 4 oz (80.9 kg)  02/06/22 175 lb (79.4 kg)  02/01/22 181 lb (82.1 kg)     GEN:  Well nourished, well developed in no acute distress HEENT: Normal NECK: No JVD; No carotid bruits CARDIAC: RRR, no murmurs, rubs, gallops RESPIRATORY: Decreased breath sounds bilaterally, no wheezing ABDOMEN: Soft, non-tender, non-distended MUSCULOSKELETAL:  No edema; No deformity  SKIN: Warm and dry NEUROLOGIC:  Alert and oriented x 3 PSYCHIATRIC:  Normal affect   ASSESSMENT:    1. Coronary artery disease involving native heart, unspecified vessel or lesion type, unspecified whether angina present     PLAN:    In order of problems listed above:  CAD, left heart cath 01/2022 showed CTO proximal RCA, mild to moderate proximal LAD and left circumflex disease.  Calcium score 2877, CCTA 01/31/2022 with CTO proximal RCA, severe LAD and left circumflex disease.  Echo with EF 70%.  Continue aspirin 81 mg, Lipitor 20 mg.  Start Ranexa 500 mg twice daily, low blood pressures preventing use of other standing antianginals.  Continue sublingual nitro as needed.  Follow-up in 6 months      Medication Adjustments/Labs and Tests Ordered: Current medicines are reviewed at length with the patient today.  Concerns regarding medicines are outlined above.  Orders Placed This Encounter  Procedures   EKG 12-Lead   Meds ordered this  encounter  Medications   ranolazine (RANEXA) 500 MG 12 hr tablet    Sig: Take 1 tablet (500 mg total) by mouth 2 (two) times daily.    Dispense:  60 tablet    Refill:  3   nitroGLYCERIN (NITROSTAT) 0.4 MG SL tablet    Sig: Place 1 tablet (0.4 mg total) under the tongue every 5 (five) minutes as needed for chest pain. For a max of 3 doses in 1 day.    Dispense:  30 tablet    Refill:  0    Patient Instructions  Medication Instructions:   Your physician has recommended you make the following change in your medication:    START Ranolazine (Ranexa) 500 MG twice a day.   *If you need a refill on your cardiac medications before your next appointment, please call your pharmacy*   Follow-Up: At [facility], you and your health needs are our priority.  As part of our continuing mission to provide you with exceptional heart care, we have created designated Provider Care Teams.  These Care Teams include your primary Cardiologist (physician) and Advanced Practice Providers (APPs -  Physician Assistants and Nurse Practitioners) who all work together to provide you with the care you need, when you need it.  We recommend signing up for the patient portal called "MyChart".  Sign up information is provided on this After Visit Summary.  MyChart is used to connect with patients for Virtual Visits (Telemedicine).  Patients are able to view lab/test results, encounter notes, upcoming appointments, etc.  Non-urgent messages can be sent to your provider as well.   To learn more about what you can do with MyChart, go to [email or web address].    Your next appointment:   6 month(s)  The format for your next appointment:   In Person  Provider:   You may see Debbe Odea, MD or one of the following Advanced Practice Providers on your designated Care Team:   Nicolasa Ducking, NP Eula Listen, PA-C Cadence Fransico Michael, [place]    Other Instructions   Important Information About Sugar  Signed, Debbe Odea, MD  03/22/2022 11:23 AM    [facility]

## 2022-06-11 ENCOUNTER — Other Ambulatory Visit: Payer: Self-pay

## 2022-06-11 MED ORDER — ATORVASTATIN CALCIUM 20 MG PO TABS: 20.0000 mg | ORAL_TABLET | Freq: Every day | ORAL | 2 refills | Status: DC

## 2022-09-26 ENCOUNTER — Ambulatory Visit: Payer: Medicare Other | Attending: Cardiology | Admitting: Cardiology

## 2022-09-26 ENCOUNTER — Encounter: Payer: Self-pay | Admitting: Cardiology

## 2022-09-26 VITALS — BP 100/54 | HR 89 | Ht 72.0 in | Wt 183.0 lb

## 2022-09-26 DIAGNOSIS — I2510 Atherosclerotic heart disease of native coronary artery without angina pectoris: Secondary | ICD-10-CM | POA: Diagnosis not present

## 2022-09-26 MED ORDER — ATORVASTATIN CALCIUM 20 MG PO TABS: 20.0000 mg | ORAL_TABLET | Freq: Every day | ORAL | 2 refills | Status: DC

## 2022-09-26 MED ORDER — RANOLAZINE ER 500 MG PO TB12: 500.0000 mg | ORAL_TABLET | Freq: Two times a day (BID) | ORAL | 2 refills | Status: DC

## 2022-09-26 MED ORDER — NITROGLYCERIN 0.4 MG SL SUBL: 0.4000 mg | SUBLINGUAL_TABLET | SUBLINGUAL | 1 refills | Status: DC | PRN

## 2022-09-26 NOTE — Patient Instructions (Signed)
Medication Instructions:   Your physician recommends that you continue on your current medications as directed. Please refer to the Current Medication list given to you today.  *If you need a refill on your cardiac medications before your next appointment, please call your pharmacy*   Lab Work:  None Ordered  If you have labs (blood work) drawn today and your tests are completely normal, you will receive your results only by: MyChart Message (if you have MyChart) OR A paper copy in the mail If you have any lab test that is abnormal or we need to change your treatment, we will call you to review the results.   Testing/Procedures:  None Ordered    Follow-Up: At [facility], you and your health needs are our priority.  As part of our continuing mission to provide you with exceptional heart care, we have created designated Provider Care Teams.  These Care Teams include your primary Cardiologist (physician) and Advanced Practice Providers (APPs -  Physician Assistants and Nurse Practitioners) who all work together to provide you with the care you need, when you need it.  We recommend signing up for the patient portal called "MyChart".  Sign up information is provided on this After Visit Summary.  MyChart is used to connect with patients for Virtual Visits (Telemedicine).  Patients are able to view lab/test results, encounter notes, upcoming appointments, etc.  Non-urgent messages can be sent to your provider as well.   To learn more about what you can do with MyChart, go to [email or web address].    Your next appointment:   12 month(s)  Provider:   You may see Brian Agbor-Etang, MD or one of the following Advanced Practice Providers on your designated Care Team:   Christopher Berge, NP Ryan Dunn, PA-C Cadence Furth, PA-C Sheri Hammock, NP  

## 2022-09-26 NOTE — Progress Notes (Signed)
Cardiology Office Note:    Date:  09/26/2022   ID:  Nicolas Solis, DOB 1951/06/25, MRN [id number]  PCP:  Tracie Harrier, MD   [facility] Providers Cardiologist:  Kate Sable, MD     Referring MD: Tracie Harrier, MD   Chief Complaint  Patient presents with   Follow-up    6 month f/u, no new cardiac concerns    History of Present Illness:    Nicolas Solis is a 72 y.o. male with a hx of CAD (CTO RCA, mild to moderate LAD, left circumflex ), Parkinson's disease, former smoker x50+ years presents for follow-up.   Previously started on Ranexa for antianginal therapy, has sublingual nitroglycerin which he has not used.  Feels well, denies chest pain, no new concerns at this time.  Chest pain symptoms seem to have improved.  States taking Ranexa once a day instead of twice.  Prior notes/studies Echocardiogram 01/2022 EF 70 to 75% Left heart cath 01/2022 CTO RCA, mild to moderate LAD and LCx    Past Medical History:  Diagnosis Date   Compression fracture of first cervical vertebra (HCC)    Essential tremor    Ileus (HCC)     Past Surgical History:  Procedure Laterality Date   25 GAUGE PARS PLANA VITRECTOMY WITH 20 GAUGE MVR PORT Right 03/18/2017   25 GAUGE PARS PLANA VITRECTOMY WITH 20 GAUGE MVR PORT FOR MACULAR HOLE Right 03/18/2017   Procedure: 25 GAUGE PARS PLANA VITRECTOMY WITH 20 GAUGE MVR PORT FOR MACULAR HOLE; PHOTOCOAGULATION RIGHT EYE;  Surgeon: Hayden Pedro, MD;  Location: [place];  Service: Ophthalmology;  Laterality: Right;   APPENDECTOMY     COLONOSCOPY W/ POLYPECTOMY     GAS INSERTION Right 03/18/2017   Procedure: INSERTION OF GAS RIGHT (C3F8);  Surgeon: Hayden Pedro, MD;  Location: [place];  Service: Ophthalmology;  Laterality: Right;   GAS/FLUID EXCHANGE Right 03/18/2017   Procedure: GAS/FLUID EXCHANGE RIGHT EYE;  Surgeon: Hayden Pedro, MD;  Location: [place];  Service: Ophthalmology;  Laterality: Right;   JOINT REPLACEMENT Right     Hip   LEFT HEART CATH AND CORONARY ANGIOGRAPHY N/A 02/06/2022   Procedure: LEFT HEART CATH AND CORONARY ANGIOGRAPHY;  Surgeon: Wellington Hampshire, MD;  Location: [place] [facility];  Service: Cardiovascular;  Laterality: N/A;   MEMBRANE PEEL Right 03/18/2017   Procedure: MEMBRANE PEEL RIGHT EYE;  Surgeon: Hayden Pedro, MD;  Location: [place];  Service: Ophthalmology;  Laterality: Right;   MOUTH SURGERY     PHOTOCOAGULATION Left 03/18/2017   Procedure: PHOTOCOAGULATION LEFT EYE;  Surgeon: Hayden Pedro, MD;  Location: [place];  Service: Ophthalmology;  Laterality: Left;   SERUM PATCH Right 03/18/2017   Procedure: SERUM PATCH RIGHT EYE;  Surgeon: Hayden Pedro, MD;  Location: [place];  Service: Ophthalmology;  Laterality: Right;    Current Medications: Current Meds  Medication Sig   acetaminophen (TYLENOL) 500 MG tablet Take 1,000 mg by mouth every 6 (six) hours as needed (for pain.).   aspirin EC 81 MG tablet Take 1 tablet (81 mg total) by mouth daily. Swallow whole.   carbidopa-levodopa (SINEMET IR) 25-100 MG tablet Take 2 tablets by mouth 3 (three) times daily before meals.   Carbidopa-Levodopa ER (SINEMET CR) 25-100 MG tablet controlled release Take 1 tablet by mouth at bedtime.   Cholecalciferol (VITAMIN D3) 50 MCG (2000 UT) TABS Take 2,000 Units by mouth in the morning.   Cyanocobalamin (VITAMIN B-12) 5000 MCG LOZG  Take 2,500 mcg by mouth every evening.   dorzolamide-timolol (COSOPT) 22.3-6.8 MG/ML ophthalmic solution Place 1 drop into both eyes daily.   ibuprofen (ADVIL,MOTRIN) 200 MG tablet Take 400-600 mg by mouth daily as needed for headache or moderate pain.   Multiple Vitamins-Minerals (ADULT ONE DAILY GUMMIES PO) Take 2 tablets by mouth every evening.   nicotine polacrilex (NICORETTE) 2 MG gum Take 0.25 each by mouth every 2 (two) hours as needed for smoking cessation.   Omega-3 Fatty Acids (FISH OIL PO) Take 1,000 mg by mouth in the morning.   tamsulosin (FLOMAX) 0.4 MG CAPS  capsule Take 0.4 mg by mouth daily after supper.   [DISCONTINUED] atorvastatin (LIPITOR) 20 MG tablet Take 1 tablet (20 mg total) by mouth daily.   [DISCONTINUED] ranolazine (RANEXA) 500 MG 12 hr tablet Take 1 tablet (500 mg total) by mouth 2 (two) times daily.     Allergies:   Patient has no known allergies.   Social History   Socioeconomic History   Marital status: Married    Spouse name: Not on file   Number of children: Not on file   Years of education: Not on file   Highest education level: Not on file  Occupational History   Not on file  Tobacco Use   Smoking status: Former    Years: 50.00    Types: Cigarettes    Quit date: 10/2015    Years since quitting: 6.9    Passive exposure: Past   Smokeless tobacco: Never  Vaping Use   Vaping Use: Never used  Substance and Sexual Activity   Alcohol use: Yes    Alcohol/week: 7.0 standard drinks of alcohol    Types: 7 Shots of liquor per week   Drug use: No   Sexual activity: Not on file  Other Topics Concern   Not on file  Social History Narrative   Not on file   Social Determinants of Health   Financial Resource Strain: Not on file  Food Insecurity: Not on file  Transportation Needs: Not on file  Physical Activity: Not on file  Stress: Not on file  Social Connections: Not on file     Family History: The patient's family history includes Stroke in his brother.  ROS:   Please see the history of present illness.     All other systems reviewed and are negative.  EKGs/Labs/Other Studies Reviewed:    The following studies were reviewed today:   EKG:  EKG is ordered today.  EKG shows normal sinus rhythm, right bundle branch block.  Recent Labs: 01/13/2022: ALT 8; Hemoglobin 14.8; Platelets 159 02/01/2022: BUN 11; Creatinine, Ser 0.62; Potassium 3.6; Sodium 135  Recent Lipid Panel No results found for: "CHOL", "TRIG", "HDL", "CHOLHDL", "VLDL", "LDLCALC", "LDLDIRECT"  Outside lipid panel 02/2021 total cholesterol  154, triglyceride 99, LDL 88, HDL 46  Risk Assessment/Calculations:         Physical Exam:    VS:  BP (!) 100/54 (BP Location: Left Arm, Patient Position: Sitting, Cuff Size: Normal)   Pulse 89   Ht 6' (1.829 m)   Wt 183 lb (83 kg)   SpO2 96%   BMI 24.82 kg/m     Wt Readings from Last 3 Encounters:  09/26/22 183 lb (83 kg)  03/22/22 178 lb 4 oz (80.9 kg)  02/06/22 175 lb (79.4 kg)     GEN:  Well nourished, well developed in no acute distress HEENT: Normal NECK: No JVD; No carotid bruits CARDIAC: RRR, no  murmurs, rubs, gallops RESPIRATORY: Decreased breath sounds bilaterally, no wheezing ABDOMEN: Soft, non-tender, non-distended MUSCULOSKELETAL:  No edema; No deformity  SKIN: Warm and dry NEUROLOGIC:  Alert and oriented x 3 PSYCHIATRIC:  Normal affect   ASSESSMENT:    1. Coronary artery disease involving native heart, unspecified vessel or lesion type, unspecified whether angina present     PLAN:    In order of problems listed above:  CAD, left heart cath 01/2022 showed CTO proximal RCA, mild to moderate proximal LAD and left circumflex disease. Echo with EF 70%.  Denies chest pain. Continue aspirin 81 mg, Lipitor 20 mg.  Advised to take Ranexa 500 mg twice daily, low blood pressures preventing use of other standing antianginals.  Follow-up in 12 months     Medication Adjustments/Labs and Tests Ordered: Current medicines are reviewed at length with the patient today.  Concerns regarding medicines are outlined above.  Orders Placed This Encounter  Procedures   EKG 12-Lead   Meds ordered this encounter  Medications   atorvastatin (LIPITOR) 20 MG tablet    Sig: Take 1 tablet (20 mg total) by mouth daily.    Dispense:  90 tablet    Refill:  2   ranolazine (RANEXA) 500 MG 12 hr tablet    Sig: Take 1 tablet (500 mg total) by mouth 2 (two) times daily.    Dispense:  180 tablet    Refill:  2   nitroGLYCERIN (NITROSTAT) 0.4 MG SL tablet    Sig: Place 1 tablet (0.4  mg total) under the tongue every 5 (five) minutes as needed for chest pain. For a max of 3 doses in 1 day.    Dispense:  30 tablet    Refill:  1    Patient Instructions  Medication Instructions:   Your physician recommends that you continue on your current medications as directed. Please refer to the Current Medication list given to you today.  *If you need a refill on your cardiac medications before your next appointment, please call your pharmacy*   Lab Work:  None Ordered  If you have labs (blood work) drawn today and your tests are completely normal, you will receive your results only by: [place] (if you have MyChart) OR A paper copy in the mail If you have any lab test that is abnormal or we need to change your treatment, we will call you to review the results.   Testing/Procedures:  None Ordered   Follow-Up: At [facility], you and your health needs are our priority.  As part of our continuing mission to provide you with exceptional heart care, we have created designated Provider Care Teams.  These Care Teams include your primary Cardiologist (physician) and Advanced Practice Providers (APPs -  Physician Assistants and Nurse Practitioners) who all work together to provide you with the care you need, when you need it.  We recommend signing up for the patient portal called "MyChart".  Sign up information is provided on this After Visit Summary.  MyChart is used to connect with patients for Virtual Visits (Telemedicine).  Patients are able to view lab/test results, encounter notes, upcoming appointments, etc.  Non-urgent messages can be sent to your provider as well.   To learn more about what you can do with MyChart, go to [email or web address].    Your next appointment:   12 month(s)  Provider:   You may see Kate Sable, MD or one of the following Advanced Practice Providers on your designated Care Team:  Murray Hodgkins, NP Christell Faith,  PA-C Cadence Kathlen Mody, PA-C Gerrie Nordmann, NP   Signed, Kate Sable, MD  09/26/2022 12:10 PM    [place]

## 2022-10-02 ENCOUNTER — Other Ambulatory Visit: Payer: Self-pay | Admitting: Student

## 2022-10-02 DIAGNOSIS — R296 Repeated falls: Secondary | ICD-10-CM

## 2022-10-02 DIAGNOSIS — R0781 Pleurodynia: Secondary | ICD-10-CM

## 2022-10-06 ENCOUNTER — Emergency Department: Payer: Medicare Other

## 2022-10-06 ENCOUNTER — Inpatient Hospital Stay
Admission: EM | Admit: 2022-10-06 | Discharge: 2022-10-11 | DRG: 644 | Disposition: A | Discharge status: Skilled Nursing Facility | Payer: Medicare Other | Attending: Internal Medicine | Admitting: Internal Medicine

## 2022-10-06 ENCOUNTER — Other Ambulatory Visit: Payer: Self-pay

## 2022-10-06 ENCOUNTER — Encounter: Payer: Self-pay | Admitting: Internal Medicine

## 2022-10-06 DIAGNOSIS — I2510 Atherosclerotic heart disease of native coronary artery without angina pectoris: Secondary | ICD-10-CM | POA: Diagnosis present

## 2022-10-06 DIAGNOSIS — Y9301 Activity, walking, marching and hiking: Secondary | ICD-10-CM | POA: Diagnosis present

## 2022-10-06 DIAGNOSIS — S32058A Other fracture of fifth lumbar vertebra, initial encounter for closed fracture: Secondary | ICD-10-CM | POA: Diagnosis not present

## 2022-10-06 DIAGNOSIS — F1010 Alcohol abuse, uncomplicated: Secondary | ICD-10-CM | POA: Insufficient documentation

## 2022-10-06 DIAGNOSIS — S3210XA Unspecified fracture of sacrum, initial encounter for closed fracture: Secondary | ICD-10-CM

## 2022-10-06 DIAGNOSIS — Z79899 Other long term (current) drug therapy: Secondary | ICD-10-CM

## 2022-10-06 DIAGNOSIS — S32110A Nondisplaced Zone I fracture of sacrum, initial encounter for closed fracture: Secondary | ICD-10-CM | POA: Diagnosis present

## 2022-10-06 DIAGNOSIS — S2232XA Fracture of one rib, left side, initial encounter for closed fracture: Secondary | ICD-10-CM | POA: Insufficient documentation

## 2022-10-06 DIAGNOSIS — E222 Syndrome of inappropriate secretion of antidiuretic hormone: Secondary | ICD-10-CM | POA: Diagnosis not present

## 2022-10-06 DIAGNOSIS — K5900 Constipation, unspecified: Secondary | ICD-10-CM | POA: Diagnosis present

## 2022-10-06 DIAGNOSIS — G250 Essential tremor: Secondary | ICD-10-CM | POA: Diagnosis present

## 2022-10-06 DIAGNOSIS — G20A1 Parkinson's disease without dyskinesia, without mention of fluctuations: Secondary | ICD-10-CM | POA: Insufficient documentation

## 2022-10-06 DIAGNOSIS — Y92008 Other place in unspecified non-institutional (private) residence as the place of occurrence of the external cause: Secondary | ICD-10-CM | POA: Diagnosis not present

## 2022-10-06 DIAGNOSIS — S32019A Unspecified fracture of first lumbar vertebra, initial encounter for closed fracture: Secondary | ICD-10-CM | POA: Insufficient documentation

## 2022-10-06 DIAGNOSIS — Z823 Family history of stroke: Secondary | ICD-10-CM

## 2022-10-06 DIAGNOSIS — F419 Anxiety disorder, unspecified: Secondary | ICD-10-CM | POA: Diagnosis present

## 2022-10-06 DIAGNOSIS — E785 Hyperlipidemia, unspecified: Secondary | ICD-10-CM | POA: Diagnosis present

## 2022-10-06 DIAGNOSIS — Z7982 Long term (current) use of aspirin: Secondary | ICD-10-CM

## 2022-10-06 DIAGNOSIS — Z1152 Encounter for screening for COVID-19: Secondary | ICD-10-CM | POA: Diagnosis not present

## 2022-10-06 DIAGNOSIS — R296 Repeated falls: Secondary | ICD-10-CM | POA: Diagnosis present

## 2022-10-06 DIAGNOSIS — Z87891 Personal history of nicotine dependence: Secondary | ICD-10-CM | POA: Diagnosis not present

## 2022-10-06 DIAGNOSIS — W108XXA Fall (on) (from) other stairs and steps, initial encounter: Secondary | ICD-10-CM | POA: Diagnosis present

## 2022-10-06 DIAGNOSIS — E871 Hypo-osmolality and hyponatremia: Principal | ICD-10-CM | POA: Diagnosis present

## 2022-10-06 DIAGNOSIS — S2242XA Multiple fractures of ribs, left side, initial encounter for closed fracture: Secondary | ICD-10-CM | POA: Diagnosis present

## 2022-10-06 DIAGNOSIS — W19XXXA Unspecified fall, initial encounter: Secondary | ICD-10-CM

## 2022-10-06 DIAGNOSIS — S32009A Unspecified fracture of unspecified lumbar vertebra, initial encounter for closed fracture: Secondary | ICD-10-CM

## 2022-10-06 LAB — CBC WITH DIFFERENTIAL/PLATELET
Abs Immature Granulocytes: 0.04 10*3/uL (ref 0.00–0.07)
Basophils Absolute: 0 10*3/uL (ref 0.0–0.1)
Basophils Relative: 1 %
Eosinophils Absolute: 0.2 10*3/uL (ref 0.0–0.5)
Eosinophils Relative: 3 %
HCT: 35.5 % — ABNORMAL LOW (ref 39.0–52.0)
Hemoglobin: 12.7 g/dL — ABNORMAL LOW (ref 13.0–17.0)
Immature Granulocytes: 1 %
Lymphocytes Relative: 14 %
Lymphs Abs: 0.9 10*3/uL (ref 0.7–4.0)
MCH: 34.6 pg — ABNORMAL HIGH (ref 26.0–34.0)
MCHC: 35.8 g/dL (ref 30.0–36.0)
MCV: 96.7 fL (ref 80.0–100.0)
Monocytes Absolute: 0.5 10*3/uL (ref 0.1–1.0)
Monocytes Relative: 8 %
Neutro Abs: 4.6 10*3/uL (ref 1.7–7.7)
Neutrophils Relative %: 73 %
Platelets: 218 10*3/uL (ref 150–400)
RBC: 3.67 MIL/uL — ABNORMAL LOW (ref 4.22–5.81)
RDW: 12.5 % (ref 11.5–15.5)
WBC: 6.2 10*3/uL (ref 4.0–10.5)
nRBC: 0 % (ref 0.0–0.2)

## 2022-10-06 LAB — RESP PANEL BY RT-PCR (RSV, FLU A&B, COVID)  RVPGX2
Influenza A by PCR: NEGATIVE
Influenza B by PCR: NEGATIVE
Resp Syncytial Virus by PCR: NEGATIVE
SARS Coronavirus 2 by RT PCR: NEGATIVE

## 2022-10-06 LAB — URINALYSIS, ROUTINE W REFLEX MICROSCOPIC
Bilirubin Urine: NEGATIVE
Glucose, UA: NEGATIVE mg/dL
Hgb urine dipstick: NEGATIVE
Ketones, ur: 5 mg/dL — AB
Leukocytes,Ua: NEGATIVE
Nitrite: NEGATIVE
Protein, ur: NEGATIVE mg/dL
Specific Gravity, Urine: 1.011 (ref 1.005–1.030)
pH: 7 (ref 5.0–8.0)

## 2022-10-06 LAB — SODIUM
Sodium: 122 mmol/L — ABNORMAL LOW (ref 135–145)
Sodium: 121 mmol/L — ABNORMAL LOW (ref 135–145)
Sodium: 122 mmol/L — ABNORMAL LOW (ref 135–145)
Sodium: 121 mmol/L — ABNORMAL LOW (ref 135–145)

## 2022-10-06 LAB — PHOSPHORUS: Phosphorus: 3 mg/dL (ref 2.5–4.6)

## 2022-10-06 LAB — BASIC METABOLIC PANEL
Anion gap: 9 (ref 5–15)
BUN: 9 mg/dL (ref 8–23)
CO2: 24 mmol/L (ref 22–32)
Calcium: 8.5 mg/dL — ABNORMAL LOW (ref 8.9–10.3)
Chloride: 88 mmol/L — ABNORMAL LOW (ref 98–111)
Creatinine, Ser: 0.43 mg/dL — ABNORMAL LOW (ref 0.61–1.24)
GFR, Estimated: >60 mL/min (ref >60)
Glucose, Bld: 125 mg/dL — ABNORMAL HIGH (ref 70–99)
Potassium: 3.7 mmol/L (ref 3.5–5.1)
Sodium: 121 mmol/L — ABNORMAL LOW (ref 135–145)

## 2022-10-06 LAB — MAGNESIUM: Magnesium: 1.6 mg/dL — ABNORMAL LOW (ref 1.7–2.4)

## 2022-10-06 LAB — TROPONIN I (HIGH SENSITIVITY)
Troponin I (High Sensitivity): 15 ng/L (ref <18)
Troponin I (High Sensitivity): 15 ng/L (ref <18)

## 2022-10-06 LAB — SODIUM, URINE, RANDOM: Sodium, Ur: 55 mmol/L

## 2022-10-06 LAB — OSMOLALITY: Osmolality: 263 mOsm/kg — ABNORMAL LOW (ref 275–295)

## 2022-10-06 LAB — OSMOLALITY, URINE: Osmolality, Ur: 335 mOsm/kg (ref 300–900)

## 2022-10-06 LAB — TRIGLYCERIDES: Triglycerides: 78 mg/dL (ref <150)

## 2022-10-06 MED ORDER — NITROGLYCERIN 0.4 MG SL SUBL
0.4000 mg | SUBLINGUAL_TABLET | SUBLINGUAL | Status: DC | PRN
  Administered 2022-10-07: 0.4 mg via SUBLINGUAL
  Filled 2022-10-06: qty 1

## 2022-10-06 MED ORDER — DORZOLAMIDE HCL-TIMOLOL MAL 2-0.5 % OP SOLN: 1.0000 [drp] | Freq: Every day | OPHTHALMIC | Status: DC

## 2022-10-06 MED ORDER — SODIUM CHLORIDE 0.9 % IV SOLN: INTRAVENOUS | Status: AC

## 2022-10-06 MED ORDER — LIDOCAINE 5 % EX PTCH
1.0000 | MEDICATED_PATCH | CUTANEOUS | Status: DC
  Administered 2022-10-06 – 2022-10-11 (×6): 1 via TRANSDERMAL
  Filled 2022-10-06 (×7): qty 1

## 2022-10-06 MED ORDER — CARBIDOPA-LEVODOPA 25-100 MG PO TABS
2.0000 | ORAL_TABLET | Freq: Three times a day (TID) | ORAL | Status: DC
  Administered 2022-10-07 – 2022-10-11 (×14): 2 via ORAL
  Filled 2022-10-06 (×15): qty 2

## 2022-10-06 MED ORDER — MORPHINE SULFATE (PF) 2 MG/ML IV SOLN
2.0000 mg | INTRAVENOUS | Status: AC | PRN
  Administered 2022-10-07 – 2022-10-09 (×4): 2 mg via INTRAVENOUS
  Filled 2022-10-06 (×4): qty 1

## 2022-10-06 MED ORDER — LORAZEPAM 2 MG/ML IJ SOLN
2.0000 mg | Freq: Four times a day (QID) | INTRAMUSCULAR | Status: AC | PRN
  Administered 2022-10-07: 2 mg via INTRAVENOUS
  Filled 2022-10-06: qty 1

## 2022-10-06 MED ORDER — ONDANSETRON HCL 4 MG/2ML IJ SOLN: 4.0000 mg | Freq: Four times a day (QID) | INTRAMUSCULAR | Status: AC | PRN

## 2022-10-06 MED ORDER — ENOXAPARIN SODIUM 40 MG/0.4ML IJ SOSY
40.0000 mg | PREFILLED_SYRINGE | INTRAMUSCULAR | Status: DC
  Administered 2022-10-06 – 2022-10-10 (×5): 40 mg via SUBCUTANEOUS
  Filled 2022-10-06 (×5): qty 0.4

## 2022-10-06 MED ORDER — SODIUM CHLORIDE 0.9 % IV SOLN: Freq: Once | INTRAVENOUS | Status: AC

## 2022-10-06 MED ORDER — VITAMIN D 25 MCG (1000 UNIT) PO TABS
2000.0000 [IU] | ORAL_TABLET | Freq: Every morning | ORAL | Status: DC
  Administered 2022-10-07 – 2022-10-11 (×5): 2000 [IU] via ORAL
  Filled 2022-10-06 (×6): qty 2

## 2022-10-06 MED ORDER — TAMSULOSIN HCL 0.4 MG PO CAPS
0.4000 mg | ORAL_CAPSULE | Freq: Every day | ORAL | Status: DC
  Administered 2022-10-06 – 2022-10-10 (×5): 0.4 mg via ORAL
  Filled 2022-10-06 (×5): qty 1

## 2022-10-06 MED ORDER — ACETAMINOPHEN 650 MG RE SUPP: 650.0000 mg | Freq: Four times a day (QID) | RECTAL | Status: AC | PRN

## 2022-10-06 MED ORDER — SODIUM CHLORIDE 0.9 % IV SOLN: INTRAVENOUS | Status: DC

## 2022-10-06 MED ORDER — ONDANSETRON HCL 4 MG PO TABS: 4.0000 mg | ORAL_TABLET | Freq: Four times a day (QID) | ORAL | Status: AC | PRN

## 2022-10-06 MED ORDER — DORZOLAMIDE HCL 2 % OP SOLN
1.0000 [drp] | Freq: Every day | OPHTHALMIC | Status: DC
  Administered 2022-10-07 – 2022-10-11 (×5): 1 [drp] via OPHTHALMIC
  Filled 2022-10-06 (×2): qty 10

## 2022-10-06 MED ORDER — TIMOLOL MALEATE 0.5 % OP SOLN
1.0000 [drp] | Freq: Every day | OPHTHALMIC | Status: DC
  Administered 2022-10-07 – 2022-10-11 (×4): 1 [drp] via OPHTHALMIC
  Filled 2022-10-06 (×2): qty 5

## 2022-10-06 MED ORDER — THIAMINE MONONITRATE 100 MG PO TABS
200.0000 mg | ORAL_TABLET | Freq: Every day | ORAL | Status: AC
  Administered 2022-10-06 – 2022-10-07 (×2): 200 mg via ORAL
  Filled 2022-10-06 (×2): qty 2

## 2022-10-06 MED ORDER — KETOROLAC TROMETHAMINE 15 MG/ML IJ SOLN
15.0000 mg | Freq: Four times a day (QID) | INTRAMUSCULAR | Status: AC | PRN
  Administered 2022-10-07 – 2022-10-09 (×3): 15 mg via INTRAVENOUS
  Filled 2022-10-06 (×4): qty 1

## 2022-10-06 MED ORDER — MORPHINE SULFATE (PF) 2 MG/ML IV SOLN: 2.0000 mg | Freq: Once | INTRAVENOUS | Status: DC

## 2022-10-06 MED ORDER — ATORVASTATIN CALCIUM 20 MG PO TABS
20.0000 mg | ORAL_TABLET | Freq: Every day | ORAL | Status: DC
  Administered 2022-10-06 – 2022-10-10 (×5): 20 mg via ORAL
  Filled 2022-10-06 (×5): qty 1

## 2022-10-06 MED ORDER — ACETAMINOPHEN 325 MG PO TABS
650.0000 mg | ORAL_TABLET | Freq: Four times a day (QID) | ORAL | Status: AC | PRN
  Administered 2022-10-10 – 2022-10-11 (×3): 650 mg via ORAL
  Filled 2022-10-06 (×3): qty 2

## 2022-10-06 MED ORDER — VITAMIN B-12 1000 MCG PO TABS
2500.0000 ug | ORAL_TABLET | Freq: Every evening | ORAL | Status: DC
  Administered 2022-10-06 – 2022-10-10 (×5): 2500 ug via ORAL
  Filled 2022-10-06 (×6): qty 3

## 2022-10-06 MED ORDER — CARBIDOPA-LEVODOPA ER 25-100 MG PO TBCR
1.0000 | EXTENDED_RELEASE_TABLET | Freq: Every day | ORAL | Status: DC
  Administered 2022-10-06 – 2022-10-10 (×5): 1 via ORAL
  Filled 2022-10-06 (×5): qty 1

## 2022-10-06 MED ORDER — FOLIC ACID 1 MG PO TABS
1.0000 mg | ORAL_TABLET | Freq: Every day | ORAL | Status: AC
  Administered 2022-10-06 – 2022-10-10 (×5): 1 mg via ORAL
  Filled 2022-10-06 (×5): qty 1

## 2022-10-06 MED ORDER — SENNOSIDES-DOCUSATE SODIUM 8.6-50 MG PO TABS: 1.0000 | ORAL_TABLET | Freq: Every evening | ORAL | Status: DC | PRN

## 2022-10-06 MED ORDER — RANOLAZINE ER 500 MG PO TB12
500.0000 mg | ORAL_TABLET | Freq: Two times a day (BID) | ORAL | Status: DC
  Administered 2022-10-06 – 2022-10-11 (×10): 500 mg via ORAL
  Filled 2022-10-06 (×10): qty 1

## 2022-10-06 MED ORDER — ACETAMINOPHEN 10 MG/ML IV SOLN
1000.0000 mg | Freq: Once | INTRAVENOUS | Status: AC
  Administered 2022-10-06: 1000 mg via INTRAVENOUS
  Filled 2022-10-06: qty 100

## 2022-10-06 MED ORDER — MAGNESIUM SULFATE IN D5W 1-5 GM/100ML-% IV SOLN
1.0000 g | Freq: Once | INTRAVENOUS | Status: AC
  Administered 2022-10-06: 1 g via INTRAVENOUS
  Filled 2022-10-06: qty 100

## 2022-10-06 NOTE — Progress Notes (Signed)
Orthopedic Tech Progress Note Patient Details:  Nicolas Solis 1951-04-27 [id number]  Patient ID: Nicolas Solis, male   DOB: 08-09-1950, 72 y.o.   MRN: [id number] Order clarified with ordering provider and placed with [facility]. Vernona Rieger 10/06/2022, 3:26 PM

## 2022-10-06 NOTE — Assessment & Plan Note (Addendum)
With increasing weakness and poor PO intake Presumed secondary to hyponatremia, fall precaution Check phospherous and magnesium level PT, OT, TOC were consulted

## 2022-10-06 NOTE — Assessment & Plan Note (Signed)
Left anterolateral 6th-10th ribs Incentive spirometry and flutter valve ordered Lidocaine patch daily ordered Toradol 15 mg IV every 6 hours as needed for moderate pain, 1 day ordered Morphine 2 mg IV every 4 hours as needed for severe pain, 4 doses ordered

## 2022-10-06 NOTE — Assessment & Plan Note (Addendum)
Check triglyceride, serum osmolality Random sodium urine level and urine osmolality were within normal limits Serum sodium level every 2 hours to ensure slow improvement, first check at 1700 on day of admission, 6 lab checks ordered Status post sodium chloride 150 mm bolus ordered by EDP Sodium chloride 75 mL/h, 1 day ordered Admit to PCU, inpatient

## 2022-10-06 NOTE — Hospital Course (Addendum)
Mr. Nicolas Solis 72 year old male with history of hyperlipidemia, Parkinson's on Sinemet, former tobacco user 50+ years, history of L1 compression fracture, who presents emergency department for chief concerns of weakness and imbalance.  Of note, patient fell at home about 2 to 3 weeks ago hitting his head on the stair banister while walking up the stairs.  Vitals in the ED showed temperature of 98.2, respiration rate of 19, heart rate 83, blood pressure 149/85, SpO2 98% on room air.  Serum sodium is 121, potassium 3.7, chloride 88, bicarb 26, BUN of 9, serum creatinine 0.43, EGFR greater than 60, nonfasting blood glucose 125, WBC 6.2, hemoglobin 12.7, platelets of 218.  High sensitive troponin is 15 and on repeat was 15.  UA was negative for leukocytes and nitrates.  CT of the head without contrast: Was read as no acute intracranial abnormality.  CT lumbar spine: Acute nondisplaced fracture of the left S1 transverse process.  Acute nondisplaced fracture of left sacral ala which extensive left SI joint.  No substantial change to chronic severe L1 burst fracture with mild bony retropulsion and severe central height loss.  CT thoracic: Mild superior endplate irregularity and height loss in T1 and T2 vertebral bodies.  Age indeterminate.  ED treatment: Lidocaine patch one-time dose, acetaminophen 1 g IV one-time dose, sodium chloride 150 mL bolus one-time dose given.  And morphine 2 mg IV was ordered and family declined.

## 2022-10-06 NOTE — H&P (Addendum)
History and Physical   Nicolas Solis [id number] DOB: [birth year] DOA: 10/06/2022  PCP: Tracie Harrier, MD  Outpatient Specialists: Dr. Garen Lah, [facility] Card[facility] Patient coming from: home via EMS  I have personally briefly reviewed patient's old medical records in [place].  Chief Concern: frequent falls, weakness  HPI: Nicolas Solis 72 year old male with history of hyperlipidemia, Parkinson's on Sinemet, former tobacco user 50+ years, history of L1 compression fracture, who presents emergency department for chief concerns of weakness and imbalance.  Of note, patient fell at home about 2 to 3 weeks ago hitting his head on the stair banister while walking up the stairs.  Vitals in the ED showed temperature of 98.2, respiration rate of 19, heart rate 83, blood pressure 149/85, SpO2 98% on room air.  Serum sodium is 121, potassium 3.7, chloride 88, bicarb 26, BUN of 9, serum creatinine 0.43, EGFR greater than 60, nonfasting blood glucose 125, WBC 6.2, hemoglobin 12.7, platelets of 218.  High sensitive troponin is 15 and on repeat was 15.  UA was negative for leukocytes and nitrates.  CT of the head without contrast: Was read as no acute intracranial abnormality.  CT lumbar spine: Acute nondisplaced fracture of the left S1 transverse process.  Acute nondisplaced fracture of left sacral ala which extensive left SI joint.  No substantial change to chronic severe L1 burst fracture with mild bony retropulsion and severe central height loss.  CT thoracic: Mild superior endplate irregularity and height loss in T1 and T2 vertebral bodies.  Age indeterminate.  ED treatment: Lidocaine patch one-time dose, acetaminophen 1 g IV one-time dose, sodium chloride 150 mL bolus one-time dose given.  And morphine 2 mg IV was ordered and family declined. -------------------------- At bedside, he is able to tell me his name, age, current location, current calendar year.   Patient fell  and hit his head on the banister 3 weeks ago.  Since then he has been feeling weak.  He denies overt pain until Friday.  He denies numbness in his lower extremities.  At bedside he is able to move all his toes and pulses are intact.  He reports being constipated. He reports last BM was two days ago (Friday), and it was a good brown bowel movement. He denies chest pain, shortness of breath,abdominal pain, dysuria, hematuria, diarrhea.  Patient denies trauma to his person.  He drinks two shots of bourbon nightly. He reports poor PO intake otherwise.  Social history: He lives with his spouse. He denies tobacco and recreational dru guse. She drinks two bourbons per night, last drink was evening of 10/05/22. He   ROS: Constitutional: no weight change, no fever ENT/Mouth: no sore throat, no rhinorrhea Eyes: no eye pain, no vision changes Cardiovascular: no chest pain, no dyspnea,  no edema, no palpitations Respiratory: no cough, no sputum, no wheezing Gastrointestinal: no nausea, no vomiting, no diarrhea, no constipation Genitourinary: no urinary incontinence, no dysuria, no hematuria Musculoskeletal: no arthralgias, no myalgias Skin: no skin lesions, no pruritus, Neuro: + weakness, no loss of consciousness, no syncope Psych: no anxiety, no depression, + decrease appetite Heme/Lymph: no bruising, no bleeding  ED Course: Discussed with emergency medicine provider, patient requiring hospitalization for chief concerns of weakness and hyponatremia and fall risk.  Assessment/Plan  Principal Problem:   Hyponatremia Active Problems:   Coronary artery disease   Parkinson's disease   Hyperlipidemia   Frequent falls   Hypomagnesemia   Alcohol abuse   Left rib fracture   L1  vertebral fracture (HCC)   Assessment and Plan:  * Hyponatremia Check triglyceride, serum osmolality Random sodium urine level and urine osmolality were within normal limits Serum sodium level every 2 hours to ensure  slow improvement, first check at 1700 on day of admission, 6 lab checks ordered Status post sodium chloride 150 mm bolus ordered by EDP Sodium chloride 75 mL/h, 1 day ordered Admit to PCU, inpatient  L1 vertebral fracture (HCC) With left S1 transverse fracture Dr. Cari Caraway states he will discuss with his orthopedic trauma colleagues regarding the sacral and left sacral ala fracture Symptomatic support Status post brace in place Continue outpatient follow-up with neurosurgery  Left rib fracture Left anterolateral 6th-10th ribs Incentive spirometry and flutter valve ordered Lidocaine patch daily ordered Toradol 15 mg IV every 6 hours as needed for moderate pain, 1 day ordered Morphine 2 mg IV every 4 hours as needed for severe pain, 4 doses ordered  Alcohol abuse Patient has been drinking at least 2 drinks of bourbon nightly despite poor p.o. intake over the last 2 weeks Counseled patient on this excessive EtOH intake in setting of poor p.o. intake otherwise Timing 100 mg p.o. daily, folic acid 1 mg daily ordered Ativan 2 mg IV every 6 hours.  For anxiety, 2 days ordered  Hypomagnesemia Magnesium sulfate IV 1 g one-time dose ordered Recheck magnesium level in the a.m.  Frequent falls With increasing weakness and poor PO intake Presumed secondary to hyponatremia, fall precaution Check phospherous and magnesium level PT, OT, TOC were consulted  Parkinson's disease Carbidopa-levodopa immediate release 25-100 mg tablet, 2 tablets 3 times daily before meals (spouse request 30 minutes before each meal) Carbidopa-levodopa controlled release 25-100, 1 tablet nightly were resumed  Chart reviewed.   DVT prophylaxis: Enoxaparin 40 mg subcutaneous every 24 hours Code Status: full code Diet: Heart healthy Family Communication: Updated spouse at bedside with patient's permission, Psychologist, forensic Disposition Plan: pending clinical course Consults called: TOC, PT, OT Admission status:  Inpatient, progressive cardiac  Past Medical History:  Diagnosis Date   Compression fracture of first cervical vertebra (HCC)    Essential tremor    Ileus ([place])    Past Surgical History:  Procedure Laterality Date   25 GAUGE PARS PLANA VITRECTOMY WITH 20 GAUGE MVR PORT Right 03/18/2017   25 GAUGE PARS PLANA VITRECTOMY WITH 20 GAUGE MVR PORT FOR MACULAR HOLE Right 03/18/2017   Procedure: 25 GAUGE PARS PLANA VITRECTOMY WITH 20 GAUGE MVR PORT FOR MACULAR HOLE; PHOTOCOAGULATION RIGHT EYE;  Surgeon: Hayden Pedro, MD;  Location: [facility];  Service: Ophthalmology;  Laterality: Right;   APPENDECTOMY     COLONOSCOPY W/ POLYPECTOMY     GAS INSERTION Right 03/18/2017   Procedure: INSERTION OF GAS RIGHT (C3F8);  Surgeon: Hayden Pedro, MD;  Location: [facility];  Service: Ophthalmology;  Laterality: Right;   GAS/FLUID EXCHANGE Right 03/18/2017   Procedure: GAS/FLUID EXCHANGE RIGHT EYE;  Surgeon: Hayden Pedro, MD;  Location: [place];  Service: Ophthalmology;  Laterality: Right;   JOINT REPLACEMENT Right    Hip   LEFT HEART CATH AND CORONARY ANGIOGRAPHY N/A 02/06/2022   Procedure: LEFT HEART CATH AND CORONARY ANGIOGRAPHY;  Surgeon: Wellington Hampshire, MD;  Location: [place] [facility];  Service: Cardiovascular;  Laterality: N/A;   MEMBRANE PEEL Right 03/18/2017   Procedure: MEMBRANE PEEL RIGHT EYE;  Surgeon: Hayden Pedro, MD;  Location: [place];  Service: Ophthalmology;  Laterality: Right;   MOUTH SURGERY     PHOTOCOAGULATION Left  03/18/2017   Procedure: PHOTOCOAGULATION LEFT EYE;  Surgeon: Hayden Pedro, MD;  Location: [place];  Service: Ophthalmology;  Laterality: Left;   SERUM PATCH Right 03/18/2017   Procedure: SERUM PATCH RIGHT EYE;  Surgeon: Hayden Pedro, MD;  Location: [facility];  Service: Ophthalmology;  Laterality: Right;   Social History:  reports that he quit smoking about 6 years ago. His smoking use included cigarettes. He has been exposed to tobacco smoke. He has never used smokeless  tobacco. He reports current alcohol use of about 7.0 standard drinks of alcohol per week. He reports that he does not use drugs.  No Known Allergies Family History  Problem Relation Age of Onset   Stroke Brother    Family history: Family history reviewed and not pertinent.  Prior to Admission medications   Medication Sig Start Date End Date Taking? Authorizing Provider  acetaminophen (TYLENOL) 500 MG tablet Take 1,000 mg by mouth every 6 (six) hours as needed (for pain.).    [provider]  aspirin EC 81 MG tablet Take 1 tablet (81 mg total) by mouth daily. Swallow whole. 02/01/22   Kate Sable, MD  atorvastatin (LIPITOR) 20 MG tablet Take 1 tablet (20 mg total) by mouth daily. 09/26/22 12/26/23  Kate Sable, MD  carbidopa-levodopa (SINEMET IR) 25-100 MG tablet Take 2 tablets by mouth 3 (three) times daily before meals. 12/05/21   [provider]  Carbidopa-Levodopa ER (SINEMET CR) 25-100 MG tablet controlled release Take 1 tablet by mouth at bedtime. 12/05/21   [provider]  Cholecalciferol (VITAMIN D3) 50 MCG (2000 UT) TABS Take 2,000 Units by mouth in the morning.    [provider]  Cyanocobalamin (VITAMIN B-12) 5000 MCG LOZG Take 2,500 mcg by mouth every evening.    [provider]  dorzolamide-timolol (COSOPT) 22.3-6.8 MG/ML ophthalmic solution Place 1 drop into both eyes daily. 10/04/21   [provider]  ibuprofen (ADVIL,MOTRIN) 200 MG tablet Take 400-600 mg by mouth daily as needed for headache or moderate pain.    [provider]  Multiple Vitamins-Minerals (ADULT ONE DAILY GUMMIES PO) Take 2 tablets by mouth every evening.    [provider]  nicotine polacrilex (NICORETTE) 2 MG gum Take 0.25 each by mouth every 2 (two) hours as needed for smoking cessation.    [provider]  nitroGLYCERIN (NITROSTAT) 0.4 MG SL tablet Place 1 tablet (0.4 mg total) under the tongue every 5 (five) minutes as  needed for chest pain. For a max of 3 doses in 1 day. 09/26/22 12/25/22  Kate Sable, MD  nystatin powder Apply 1 Application topically as needed (irritation). Patient not taking: Reported on 09/26/2022    [provider]  Omega-3 Fatty Acids (FISH OIL PO) Take 1,000 mg by mouth in the morning.    [provider]  ranolazine (RANEXA) 500 MG 12 hr tablet Take 1 tablet (500 mg total) by mouth 2 (two) times daily. 09/26/22   Kate Sable, MD  tamsulosin (FLOMAX) 0.4 MG CAPS capsule Take 0.4 mg by mouth daily after supper. 09/08/21   [provider]   Physical Exam: Vitals:   10/06/22 1230 10/06/22 1548 10/06/22 1549 10/06/22 1600  BP: (!) 149/85 (!) 141/68  (!) 142/91  Pulse: 83  84 83  Resp:      Temp:      TempSrc:      SpO2: 98%  99% 98%  Weight:      Height:  Constitutional: appears age-appropriate, NAD, calm, comfortable Eyes: PERRL, lids and conjunctivae normal ENMT: Mucous membranes are moist. Posterior pharynx clear of any exudate or lesions. Age-appropriate dentition. Hearing appropriate Neck: normal, supple, no masses, no thyromegaly Respiratory: clear to auscultation bilaterally, no wheezing, no crackles. Normal respiratory effort. No accessory muscle use.  Cardiovascular: Regular rate and rhythm, no murmurs / rubs / gallops. No extremity edema. 2+ pedal pulses. No carotid bruits.  Abdomen: no tenderness, no masses palpated, no hepatosplenomegaly. Bowel sounds positive.  Musculoskeletal: no clubbing / cyanosis. No joint deformity upper and lower extremities. Good ROM, no contractures, no atrophy. Normal muscle tone.  Skin: no rashes, lesions, ulcers. No induration Neurologic: Sensation intact. Strength 5/5 in all 4.  Brace in place Psychiatric: Normal judgment and insight. Alert and oriented x 3. Normal mood.   EKG: independently reviewed, showing sinus rhythm with a rate of 78, QTc of 483  Chest x-ray on Admission: I personally  reviewed and I agree with radiologist reading as below.  CT Head Wo Contrast  Addendum Date: 10/06/2022   ADDENDUM REPORT: 10/06/2022 14:57 ADDENDUM: Correction: The left transverse fracture described in the findings and impression as at S1 is actually at L5. Also, in addition to the left sacral ala fracture described in the report there also is a nondisplaced S2 vertebral body fracture. This was discussed with Sheran Luz via telephone at 2:56 PM. Electronically Signed   By: Margaretha Sheffield M.D.   On: 10/06/2022 14:57   Result Date: 10/06/2022 CLINICAL DATA:  Head trauma, minor (Age >= 65y); Back trauma, no prior imaging (Age >= 16y); Neck trauma (Age >= 65y) EXAM: CT HEAD WITHOUT CONTRAST CT CERVICAL SPINE WITHOUT CONTRAST CT LUMBAR SPINE WITHOUT CONTRAST TECHNIQUE: Multidetector CT imaging of the head, cervical and lumbar spine was performed following the standard protocol without intravenous contrast. Multiplanar CT image reconstructions of the cervical spine were also generated. RADIATION DOSE REDUCTION: This exam was performed according to the departmental dose-optimization program which includes automated exposure control, adjustment of the mA and/or kV according to patient size and/or use of iterative reconstruction technique. COMPARISON:  None Available. FINDINGS: CT HEAD FINDINGS Brain: No evidence of acute infarction, hemorrhage, hydrocephalus, extra-axial collection or mass lesion/mass effect. Mild for age patchy white matter hypodensities, compatible with chronic microvascular ischemic disease. Vascular: No hyperdense vessel identified. Skull: No acute fracture. Sinuses/Orbits: Mild paranasal sinus mucosal thickening. No acute orbital findings. CT CERVICAL SPINE FINDINGS Alignment: Mild reversal and mild likely degenerative anterolisthesis of C4 on C5. Otherwise, no substantial sagittal subluxation Skull base and vertebrae: Mild superior endplate irregularity and height loss of the T1 and T2  vertebral bodies, age indeterminate. Cervical vertebral body heights are maintained Soft tissues and spinal canal: No prevertebral fluid or swelling. No visible canal hematoma. Disc levels: Multilevel facet uncovertebral hypertrophy with varying degrees of neural foraminal stenosis. Upper chest: Visualized lung apices are clear. Other: Calcific atherosclerosis. CT LUMBAR SPINE FINDINGS Alignment: Similar alignment.  No new sagittal subluxation. Vertebrae: Chronic L1 burst fracture with mild bony retropulsion with similar severe height loss centrally. No progressive height loss. No increase in bony retropulsion. Additional vertebral body heights are similar to the prior. Acute nondisplaced fracture through the left S1 transverse process (for example see series 4, image 98). Acute nondisplaced fracture through the left sacral ala which extensive left SI joint (for example see series 4, image 111). Soft tissues and spinal canal: No prevertebral fluid or swelling. No visible canal hematoma. Disc levels: Severe multifocal degenerative change  including facet arthropathy with varying degrees of neural foraminal stenosis. Paraspinal: Similar versus minimally increased 31 mm infrarenal abdominal aortic aneurysm, partially imaged. Calcific atherosclerosis. IMPRESSION: CT lumbar spine: 1. Acute nondisplaced fracture through the left S1 transverse process. 2. Acute nondisplaced fracture through the left sacral ala which extensive left SI joint. 3. No substantial change in chronic severe L1 burst fracture with mild bony retropulsion and severe central height loss. 4. Similar versus minimally increased 31 mm infrarenal abdominal aortic aneurysm, partially imaged. Recommend follow-up ultrasound every 3 years. This recommendation follows ACR consensus guidelines: White Paper of the ACR Incidental Findings Committee II on Vascular Findings. J Am Coll Radiol 2013CJ:3944253. 5. Aortic Atherosclerosis (ICD10-I70.0). CT thoracic  spine: Mild superior endplate irregularity and height loss of the T1 and T2 vertebral bodies, age indeterminate. Recommend correlation with the presence or absence of point tenderness. MRI of the thoracic spine could better assess chronicity if clinically warranted. CT head: No acute intracranial abnormality. Electronically Signed: By: Margaretha Sheffield M.D. On: 10/06/2022 14:51   CT Cervical Spine Wo Contrast  Addendum Date: 10/06/2022   ADDENDUM REPORT: 10/06/2022 14:57 ADDENDUM: Correction: The left transverse fracture described in the findings and impression as at S1 is actually at L5. Also, in addition to the left sacral ala fracture described in the report there also is a nondisplaced S2 vertebral body fracture. This was discussed with Sheran Luz via telephone at 2:56 PM. Electronically Signed   By: Margaretha Sheffield M.D.   On: 10/06/2022 14:57   Result Date: 10/06/2022 CLINICAL DATA:  Head trauma, minor (Age >= 65y); Back trauma, no prior imaging (Age >= 16y); Neck trauma (Age >= 65y) EXAM: CT HEAD WITHOUT CONTRAST CT CERVICAL SPINE WITHOUT CONTRAST CT LUMBAR SPINE WITHOUT CONTRAST TECHNIQUE: Multidetector CT imaging of the head, cervical and lumbar spine was performed following the standard protocol without intravenous contrast. Multiplanar CT image reconstructions of the cervical spine were also generated. RADIATION DOSE REDUCTION: This exam was performed according to the departmental dose-optimization program which includes automated exposure control, adjustment of the mA and/or kV according to patient size and/or use of iterative reconstruction technique. COMPARISON:  None Available. FINDINGS: CT HEAD FINDINGS Brain: No evidence of acute infarction, hemorrhage, hydrocephalus, extra-axial collection or mass lesion/mass effect. Mild for age patchy white matter hypodensities, compatible with chronic microvascular ischemic disease. Vascular: No hyperdense vessel identified. Skull: No acute fracture.  Sinuses/Orbits: Mild paranasal sinus mucosal thickening. No acute orbital findings. CT CERVICAL SPINE FINDINGS Alignment: Mild reversal and mild likely degenerative anterolisthesis of C4 on C5. Otherwise, no substantial sagittal subluxation Skull base and vertebrae: Mild superior endplate irregularity and height loss of the T1 and T2 vertebral bodies, age indeterminate. Cervical vertebral body heights are maintained Soft tissues and spinal canal: No prevertebral fluid or swelling. No visible canal hematoma. Disc levels: Multilevel facet uncovertebral hypertrophy with varying degrees of neural foraminal stenosis. Upper chest: Visualized lung apices are clear. Other: Calcific atherosclerosis. CT LUMBAR SPINE FINDINGS Alignment: Similar alignment.  No new sagittal subluxation. Vertebrae: Chronic L1 burst fracture with mild bony retropulsion with similar severe height loss centrally. No progressive height loss. No increase in bony retropulsion. Additional vertebral body heights are similar to the prior. Acute nondisplaced fracture through the left S1 transverse process (for example see series 4, image 98). Acute nondisplaced fracture through the left sacral ala which extensive left SI joint (for example see series 4, image 111). Soft tissues and spinal canal: No prevertebral fluid or swelling. No visible canal  hematoma. Disc levels: Severe multifocal degenerative change including facet arthropathy with varying degrees of neural foraminal stenosis. Paraspinal: Similar versus minimally increased 31 mm infrarenal abdominal aortic aneurysm, partially imaged. Calcific atherosclerosis. IMPRESSION: CT lumbar spine: 1. Acute nondisplaced fracture through the left S1 transverse process. 2. Acute nondisplaced fracture through the left sacral ala which extensive left SI joint. 3. No substantial change in chronic severe L1 burst fracture with mild bony retropulsion and severe central height loss. 4. Similar versus minimally  increased 31 mm infrarenal abdominal aortic aneurysm, partially imaged. Recommend follow-up ultrasound every 3 years. This recommendation follows ACR consensus guidelines: White Paper of the ACR Incidental Findings Committee II on Vascular Findings. J Am Coll Radiol 2013CJ:[id number]. 5. Aortic Atherosclerosis (ICD10-I70.0). CT thoracic spine: Mild superior endplate irregularity and height loss of the T1 and T2 vertebral bodies, age indeterminate. Recommend correlation with the presence or absence of point tenderness. MRI of the thoracic spine could better assess chronicity if clinically warranted. CT head: No acute intracranial abnormality. Electronically Signed: By: Margaretha Sheffield M.D. On: 10/06/2022 14:51   CT Lumbar Spine Wo Contrast  Addendum Date: 10/06/2022   ADDENDUM REPORT: 10/06/2022 14:57 ADDENDUM: Correction: The left transverse fracture described in the findings and impression as at S1 is actually at L5. Also, in addition to the left sacral ala fracture described in the report there also is a nondisplaced S2 vertebral body fracture. This was discussed with Sheran Luz via telephone at 2:56 PM. Electronically Signed   By: Margaretha Sheffield M.D.   On: 10/06/2022 14:57   Result Date: 10/06/2022 CLINICAL DATA:  Head trauma, minor (Age >= 65y); Back trauma, no prior imaging (Age >= 16y); Neck trauma (Age >= 65y) EXAM: CT HEAD WITHOUT CONTRAST CT CERVICAL SPINE WITHOUT CONTRAST CT LUMBAR SPINE WITHOUT CONTRAST TECHNIQUE: Multidetector CT imaging of the head, cervical and lumbar spine was performed following the standard protocol without intravenous contrast. Multiplanar CT image reconstructions of the cervical spine were also generated. RADIATION DOSE REDUCTION: This exam was performed according to the departmental dose-optimization program which includes automated exposure control, adjustment of the mA and/or kV according to patient size and/or use of iterative reconstruction technique. COMPARISON:   None Available. FINDINGS: CT HEAD FINDINGS Brain: No evidence of acute infarction, hemorrhage, hydrocephalus, extra-axial collection or mass lesion/mass effect. Mild for age patchy white matter hypodensities, compatible with chronic microvascular ischemic disease. Vascular: No hyperdense vessel identified. Skull: No acute fracture. Sinuses/Orbits: Mild paranasal sinus mucosal thickening. No acute orbital findings. CT CERVICAL SPINE FINDINGS Alignment: Mild reversal and mild likely degenerative anterolisthesis of C4 on C5. Otherwise, no substantial sagittal subluxation Skull base and vertebrae: Mild superior endplate irregularity and height loss of the T1 and T2 vertebral bodies, age indeterminate. Cervical vertebral body heights are maintained Soft tissues and spinal canal: No prevertebral fluid or swelling. No visible canal hematoma. Disc levels: Multilevel facet uncovertebral hypertrophy with varying degrees of neural foraminal stenosis. Upper chest: Visualized lung apices are clear. Other: Calcific atherosclerosis. CT LUMBAR SPINE FINDINGS Alignment: Similar alignment.  No new sagittal subluxation. Vertebrae: Chronic L1 burst fracture with mild bony retropulsion with similar severe height loss centrally. No progressive height loss. No increase in bony retropulsion. Additional vertebral body heights are similar to the prior. Acute nondisplaced fracture through the left S1 transverse process (for example see series 4, image 98). Acute nondisplaced fracture through the left sacral ala which extensive left SI joint (for example see series 4, image 111). Soft tissues and spinal canal: No  prevertebral fluid or swelling. No visible canal hematoma. Disc levels: Severe multifocal degenerative change including facet arthropathy with varying degrees of neural foraminal stenosis. Paraspinal: Similar versus minimally increased 31 mm infrarenal abdominal aortic aneurysm, partially imaged. Calcific atherosclerosis. IMPRESSION:  CT lumbar spine: 1. Acute nondisplaced fracture through the left S1 transverse process. 2. Acute nondisplaced fracture through the left sacral ala which extensive left SI joint. 3. No substantial change in chronic severe L1 burst fracture with mild bony retropulsion and severe central height loss. 4. Similar versus minimally increased 31 mm infrarenal abdominal aortic aneurysm, partially imaged. Recommend follow-up ultrasound every 3 years. This recommendation follows ACR consensus guidelines: White Paper of the ACR Incidental Findings Committee II on Vascular Findings. J Am Coll Radiol [id number]. 5. Aortic Atherosclerosis (ICD10-I70.0). CT thoracic spine: Mild superior endplate irregularity and height loss of the T1 and T2 vertebral bodies, age indeterminate. Recommend correlation with the presence or absence of point tenderness. MRI of the thoracic spine could better assess chronicity if clinically warranted. CT head: No acute intracranial abnormality. Electronically Signed: By: Margaretha Sheffield M.D. On: 10/06/2022 14:51   DG Ribs Unilateral W/Chest Left  Result Date: 10/06/2022 CLINICAL DATA:  LEFT chest pain following fall 3 weeks ago. Initial encounter. EXAM: LEFT RIBS AND CHEST - 3+ VIEW COMPARISON:  01/13/2022 radiograph FINDINGS: This is a low volume study. The cardiomediastinal silhouette is unremarkable. There is no evidence of focal airspace disease, pulmonary edema, suspicious pulmonary nodule/mass, pleural effusion, or pneumothorax. There are fractures of the anterolateral LEFT 6th through 10th ribs. The 6th and 7th ribs may be subacute to remote. The other fractures are acute. IMPRESSION: Fractures of the anterolateral LEFT 6th through 10th ribs. No pneumothorax or pleural effusion. No acute cardiopulmonary disease. Electronically Signed   By: Margarette Canada M.D.   On: 10/06/2022 13:20    Labs on Admission: I have personally reviewed following labs  CBC: Recent Labs  Lab 10/06/22 1231   WBC 6.2  NEUTROABS 4.6  HGB 12.7*  HCT 35.5*  MCV 96.7  PLT [id number]   Basic Metabolic Panel: Recent Labs  Lab 10/06/22 1231 10/06/22 1445 10/06/22 1717  NA 121*  --  122*  K 3.7  --   --   CL 88*  --   --   CO2 24  --   --   GLUCOSE 125*  --   --   BUN 9  --   --   CREATININE 0.43*  --   --   CALCIUM 8.5*  --   --   MG  --  1.6*  --   PHOS  --  3.0  --    GFR: Estimated Creatinine Clearance: 91.6 mL/min (A) (by C-G formula based on SCr of 0.43 mg/dL (L)).  Urine analysis:    Component Value Date/Time   COLORURINE YELLOW (A) 10/06/2022 1338   APPEARANCEUR CLEAR (A) 10/06/2022 1338   LABSPEC 1.011 10/06/2022 1338   PHURINE 7.0 10/06/2022 1338   GLUCOSEU NEGATIVE 10/06/2022 1338   HGBUR NEGATIVE 10/06/2022 1338   BILIRUBINUR NEGATIVE 10/06/2022 1338   KETONESUR 5 (A) 10/06/2022 1338   PROTEINUR NEGATIVE 10/06/2022 1338   NITRITE NEGATIVE 10/06/2022 1338   LEUKOCYTESUR NEGATIVE 10/06/2022 1338   This document was prepared using Dragon Voice Recognition software and may include unintentional dictation errors.  Dr. Tobie Poet [facility]  If 7PM-7AM, please contact overnight-coverage provider If 7AM-7PM, please contact day coverage provider [email or web address]  10/06/2022, 5:49 PM

## 2022-10-06 NOTE — Assessment & Plan Note (Signed)
Patient has been drinking at least 2 drinks of bourbon nightly despite poor p.o. intake over the last 2 weeks Counseled patient on this excessive EtOH intake in setting of poor p.o. intake otherwise Timing 100 mg p.o. daily, folic acid 1 mg daily ordered Ativan 2 mg IV every 6 hours.  For anxiety, 2 days ordered

## 2022-10-06 NOTE — ED Notes (Signed)
Ortho Tech called by this RN to place order for LSO brace [phone])

## 2022-10-06 NOTE — ED Notes (Signed)
Advised nurse that patient has ready bed 

## 2022-10-06 NOTE — Assessment & Plan Note (Signed)
Carbidopa-levodopa immediate release 25-100 mg tablet, 2 tablets 3 times daily before meals (spouse request 30 minutes before each meal) Carbidopa-levodopa controlled release 25-100, 1 tablet nightly were resumed

## 2022-10-06 NOTE — ED Provider Notes (Signed)
-----------------------------------------   2:00 PM on 10/06/2022 ----------------------------------------- Patient seen in conjunction with physician assistant Sheran Luz.  Wife and patient state for the past 3 to 4 days he has not been able to get out of bed or at least not usually due to a combination of generalized fatigue weakness as well as some lower back pain.  Patient has a history of lower back pain since an L1 compression fracture 8 years ago per patient but states over the weekend he has become so weak that he is unable to get out of bed even with his wife's assistance.  Patient denies any new falls besides 3 weeks ago but states he did not have any significant injuries during that fall but has had some mild discomfort in the left chest wall as well as the lower back which has worsened over the last few days.  Denies any dysuria, fever, cough or congestion.  We will check labs, obtain CT images of the head, lumbar spine, x-rays of the left ribs.  Will continue to close monitor while awaiting further results.  Patient's chemistry has resulted showing significant hyponatremia at 121 very likely could be the cause of the patient's increased weakness, CBC is reassuring, urinalysis is negative, COVID/flu/RSV is negative.  Patient will require admission for hyponatremia.  Will begin normal saline infusion.   Harvest Dark, MD 10/06/22 1404

## 2022-10-06 NOTE — ED Triage Notes (Addendum)
Patient is here today for a fall he sustained 3 weeks ago; He states that he was walking up the stairs and fell backwards hitting his LEFT side on the banister, he is not sure what caused the fall but he does recall falling and wife says he was unconscious; Takes 1 baby ASA daily; 3/10 generalized pain with no movement; Wife has been using a gait belt to help patient ambulate with walker and cane at home

## 2022-10-06 NOTE — Assessment & Plan Note (Signed)
Magnesium sulfate IV 1 g one-time dose ordered Recheck magnesium level in the a.m.

## 2022-10-06 NOTE — ED Provider Notes (Signed)
[facility] Provider Note    Event Date/Time   First MD Initiated Contact with Patient 10/06/22 1217     (approximate)   History   Fall (Patient is here today for a fall he sustained 3 weeks ago; He states that he was walking up the stairs and fell backwards hitting his LEFT side on the banister, he is not sure what caused the fall but he does recall falling and wife says he was unconscious; Takes 1 baby ASA daily; 3/10 generalized pain with no movement; Wife has been using a gait belt to help patient ambulate with walker and cane at home)   HPI  Nicolas Solis is a 72 y.o. male with a past medical history of Parkinson's disease who presents today for evaluation of back pain ever since he fell 3 weeks ago.  Patient reports that he was going up to the second floor, and after he took 1 or 2 steps up he fell backwards and hit his back on the banister.  He he reports that he struck his head, thinks that he lost consciousness for couple of seconds.  He reports that he has been able to ambulate since the event, but according to his wife he has had some trouble walking and has required use of a walker and also a gait belt.  Prior to the fall, patient had a walker only but did not require a gait belt.  He has had physical therapy, a couple of times a week to help him.  Patient denies any other pain today.  He reports that he had pain in his left side, but had an x-ray which was normal.  His wife was concerned because on Friday he was unable to urinate, but also notes that he had not had anything to drink.  He has been able to urinate normally since.  Patient denies any incontinence, or loss of bladder or bowel.  He denies constipation or retention.  Wife reports that he has been able to urinate normally over the weekend.  Patient denies weakness in his legs, though reports that he feels unsteady.  He has not had any vomiting.  No chest pain or shortness of breath.  Patient  reports that he has a known L1 fracture sustained 8 years ago when he fell out of an apple tree.  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      Excl. in [place]?     Most recent vital signs: Vitals:   10/06/22 1549 10/06/22 1600  BP:  (!) 142/91  Pulse: 84 83  Resp:    Temp:  SpO2: 99% 98%    Physical Exam Vitals and nursing note reviewed.  Constitutional:      General: Awake and alert. No acute distress.    Appearance: Normal appearance. The patient is normal weight.  HENT:     Head: Normocephalic and atraumatic.     Mouth: Mucous membranes are moist.  Eyes:     General: PERRL. Normal EOMs        Right eye: No discharge.        Left eye: No discharge.     Conjunctiva/sclera: Conjunctivae normal.  Cardiovascular:     Rate and Rhythm: Normal rate and regular rhythm.     Pulses: Normal pulses.  Pulmonary:     Effort: Pulmonary effort is normal. No respiratory distress.     Breath sounds: Normal breath sounds.  Left-sided chest wall tenderness without crepitus, ecchymosis, or rashes noted. Abdominal:     Abdomen is soft. There is no abdominal tenderness. No rebound or guarding. No distention. Musculoskeletal:        General: No swelling. Normal range of motion.     Cervical back: Normal range of motion and neck supple.  Back: Diffuse tenderness across lumbar area. No thoracic tenderness.  No ecchymosis, erythema, or wounds noted.  Strength and sensation 5/5 to  bilateral lower extremities. Normal great toe extension against resistance. Normal sensation throughout feet. Normal patellar reflexes. Negative SLR and opposite SLR bilaterally.  Able to actively lift bilateral legs up off of the stretcher. Skin:    General: Skin is warm and dry.     Capillary Refill: Capillary refill takes less than 2 seconds.     Findings: No rash.  Neurological:     Mental Status: The patient is awake and alert.   Neurological: GCS 15 alert and oriented x3 Normal speech, no expressive or receptive aphasia or dysarthria Cranial nerves II through XII intact Normal visual fields 5 out of 5 strength in all 4 extremities with intact sensation throughout No extremity drift Normal finger-to-nose testing, no limb or truncal ataxia   ED Results / Procedures / Treatments   Labs (all labs ordered are listed, but only abnormal results are displayed) Labs Reviewed  CBC WITH DIFFERENTIAL/PLATELET - Abnormal; Notable for the following components:      Result Value   RBC 3.67 (*)    Hemoglobin 12.7 (*)    HCT 35.5 (*)    MCH 34.6 (*)    All other components within normal limits  BASIC METABOLIC PANEL - Abnormal; Notable for the following components:   Sodium 121 (*)    Chloride 88 (*)    Glucose, Bld 125 (*)    Creatinine, Ser 0.43 (*)    Calcium 8.5 (*)    All other components within normal limits  URINALYSIS, ROUTINE W REFLEX MICROSCOPIC - Abnormal; Notable for the following components:   Color, Urine YELLOW (*)    APPearance CLEAR (*)    Ketones, ur 5 (*)    All other components within normal limits  OSMOLALITY - Abnormal; Notable for the following components:   Osmolality 263 (*)    All other components within normal limits  MAGNESIUM - Abnormal; Notable for the following components:   Magnesium 1.6 (*)    All other components within normal limits  RESP PANEL BY RT-PCR (RSV, FLU A&B, COVID)  RVPGX2  SODIUM, URINE, RANDOM  OSMOLALITY, URINE  TRIGLYCERIDES   PHOSPHORUS  BASIC METABOLIC PANEL  CBC  MAGNESIUM  SODIUM  SODIUM  SODIUM  SODIUM  SODIUM  SODIUM  TROPONIN I (HIGH SENSITIVITY)  TROPONIN I (HIGH SENSITIVITY)     EKG     RADIOLOGY I independently reviewed and interpreted imaging and agree with radiologists findings.     PROCEDURES:  Critical Care performed:   Procedures   MEDICATIONS ORDERED IN ED: Medications  lidocaine (LIDODERM) 5 % 1 patch (1 patch Transdermal Patch Applied 10/06/22 1341)  nitroGLYCERIN (NITROSTAT) SL tablet 0.4 mg (has no administration in time range)  acetaminophen (TYLENOL) tablet 650 mg (has no administration in time range)    Or  acetaminophen (TYLENOL) suppository 650 mg (has no administration in time range)  ondansetron (ZOFRAN) tablet 4 mg (has no administration in time range)    Or  ondansetron (ZOFRAN) injection 4 mg (has no administration in time range)  enoxaparin (LOVENOX) injection 40 mg (has no administration in time range)  senna-docusate (Senokot-S) tablet 1 tablet (has no administration in time range)  magnesium sulfate IVPB 1 g 100 mL (1 g Intravenous New Bag/Given 10/06/22 1719)  LORazepam (ATIVAN) injection 2 mg (has no administration in time range)  morphine (PF) 2 MG/ML injection 2 mg (has no administration in time range)  0.9 %  sodium chloride infusion (has no administration in time range)  acetaminophen (OFIRMEV) IV 1,000 mg (0 mg Intravenous Stopped 10/06/22 1411)  0.9 %  sodium chloride infusion (0 mLs Intravenous Stopped 10/06/22 1553)     IMPRESSION / MDM / ASSESSMENT AND PLAN / ED COURSE  I reviewed the triage vital signs and the nursing notes.   Differential diagnosis includes, but is not limited to, vertebral fracture, intracranial hemorrhage, electrolyte disarray, symptomatic anemia, worsening Parkinson's.  Patient is awake and alert, hemodynamically stable and afebrile.  He is oriented x 3, however he did appear to be slightly confused when he to me  that he did not take any medications and his wife held up his full bag of medications that he takes.  Patient has diffuse tenderness to his low back and ribs.  Patient and wife agreed to further workup.  IV was established and labs were obtained.  Labs are remarkable for hyponatremia to 121 which likely is contributing to his gait instability and mild confusion.  Dr. Kerman Passey ordered IV NS at 150cc/hr for 1L.   X-ray of his chest reveals multiple rib fractures, subacute in nature.  CT scans of his lumbar spine reveal L5 transverse fracture and nondisplaced S2 vertebral body fracture and left sacral ala fracture.  These findings were discussed with Dr. Cari Caraway who recommends LSO brace.  He is discussing with his orthopedic trauma colleagues regarding the sacral and left fracture.  Patient has normal strength and sensation of bilateral lower extremities.  No urinary or fecal incontinence or retention to suggest cord compression or cauda equina.  He is not on anticoagulation, low suspicion for epidural hematoma.  Given his hyponatremia, multiple rib fractures, sacral fractures, lumbar fractures, and gait instability recommended admission to the hospitalist service.  Patient and his wife are in agreement with this plan.  Patient was accepted by Dr. Tobie Poet.  Patient discussed with Dr. Kerman Passey who agrees with assessment and plan.  Patient's presentation is most consistent with acute presentation with potential threat to life or bodily function.   Clinical Course as of 10/06/22 1730  Sun Oct 06, 2022  1456 Call from radiologist - L5 transverse, S2 vertebral body fracture  [JP]  1514 Discussed with Dr. Cari Caraway who agrees with admission. Recommends LSO brace [JP]  Clinical Course User Index [JP] Chrishawn Boley, Clarnce Flock, PA-C     FINAL CLINICAL IMPRESSION(S) / ED DIAGNOSES   Final diagnoses:  Hyponatremia  Closed fracture of transverse process of lumbar vertebra, initial encounter ([place])   Closed fracture of sacrum, unspecified portion of sacrum, initial encounter ([place])  Closed fracture of multiple ribs of left side, initial encounter  Fall, initial encounter     Rx / DC Orders   ED Discharge Orders     None        Note:  This document was prepared using Dragon voice recognition software and may include unintentional dictation errors.   Emeline Gins 10/06/22 1730    Harvest Dark, MD 10/09/22 1400

## 2022-10-06 NOTE — Assessment & Plan Note (Addendum)
With left S1 transverse fracture Dr. Cari Caraway states he will discuss with his orthopedic trauma colleagues regarding the sacral and left sacral ala fracture Symptomatic support Status post brace in place Continue outpatient follow-up with neurosurgery

## 2022-10-07 ENCOUNTER — Encounter: Payer: Self-pay | Admitting: Internal Medicine

## 2022-10-07 ENCOUNTER — Inpatient Hospital Stay: Payer: Medicare Other

## 2022-10-07 DIAGNOSIS — E871 Hypo-osmolality and hyponatremia: Secondary | ICD-10-CM | POA: Diagnosis not present

## 2022-10-07 LAB — BASIC METABOLIC PANEL
Anion gap: 7 (ref 5–15)
Anion gap: 6 (ref 5–15)
BUN: 10 mg/dL (ref 8–23)
BUN: 9 mg/dL (ref 8–23)
CO2: 21 mmol/L — ABNORMAL LOW (ref 22–32)
CO2: 26 mmol/L (ref 22–32)
Calcium: 7.7 mg/dL — ABNORMAL LOW (ref 8.9–10.3)
Calcium: 8.4 mg/dL — ABNORMAL LOW (ref 8.9–10.3)
Chloride: 91 mmol/L — ABNORMAL LOW (ref 98–111)
Chloride: 91 mmol/L — ABNORMAL LOW (ref 98–111)
Creatinine, Ser: 0.48 mg/dL — ABNORMAL LOW (ref 0.61–1.24)
Creatinine, Ser: 0.44 mg/dL — ABNORMAL LOW (ref 0.61–1.24)
GFR, Estimated: >60 mL/min (ref >60)
GFR, Estimated: >60 mL/min (ref >60)
Glucose, Bld: 112 mg/dL — ABNORMAL HIGH (ref 70–99)
Glucose, Bld: 114 mg/dL — ABNORMAL HIGH (ref 70–99)
Potassium: 3.6 mmol/L (ref 3.5–5.1)
Potassium: 3.8 mmol/L (ref 3.5–5.1)
Sodium: 122 mmol/L — ABNORMAL LOW (ref 135–145)
Sodium: 123 mmol/L — ABNORMAL LOW (ref 135–145)

## 2022-10-07 LAB — HEPATIC FUNCTION PANEL
ALT: 14 U/L (ref 0–44)
ALT: 6 U/L (ref 0–44)
AST: 49 U/L — ABNORMAL HIGH (ref 15–41)
AST: 49 U/L — ABNORMAL HIGH (ref 15–41)
Albumin: 3.2 g/dL — ABNORMAL LOW (ref 3.5–5.0)
Albumin: 3.8 g/dL (ref 3.5–5.0)
Alkaline Phosphatase: 83 U/L (ref 38–126)
Alkaline Phosphatase: 91 U/L (ref 38–126)
Bilirubin, Direct: 0.1 mg/dL (ref 0.0–0.2)
Bilirubin, Direct: 0.1 mg/dL (ref 0.0–0.2)
Indirect Bilirubin: 0.7 mg/dL (ref 0.3–0.9)
Indirect Bilirubin: 0.6 mg/dL (ref 0.3–0.9)
Total Bilirubin: 0.8 mg/dL (ref 0.3–1.2)
Total Bilirubin: 0.7 mg/dL (ref 0.3–1.2)
Total Protein: 6.3 g/dL — ABNORMAL LOW (ref 6.5–8.1)
Total Protein: 7.9 g/dL (ref 6.5–8.1)

## 2022-10-07 LAB — CBC
HCT: 30.5 % — ABNORMAL LOW (ref 39.0–52.0)
Hemoglobin: 11 g/dL — ABNORMAL LOW (ref 13.0–17.0)
MCH: 35 pg — ABNORMAL HIGH (ref 26.0–34.0)
MCHC: 36.1 g/dL — ABNORMAL HIGH (ref 30.0–36.0)
MCV: 97.1 fL (ref 80.0–100.0)
Platelets: 193 10*3/uL (ref 150–400)
RBC: 3.14 MIL/uL — ABNORMAL LOW (ref 4.22–5.81)
RDW: 12.8 % (ref 11.5–15.5)
WBC: 5.3 10*3/uL (ref 4.0–10.5)
nRBC: 0 % (ref 0.0–0.2)

## 2022-10-07 LAB — SODIUM
Sodium: 123 mmol/L — ABNORMAL LOW (ref 135–145)
Sodium: 124 mmol/L — ABNORMAL LOW (ref 135–145)

## 2022-10-07 LAB — TROPONIN I (HIGH SENSITIVITY): Troponin I (High Sensitivity): 14 ng/L (ref <18)

## 2022-10-07 LAB — MAGNESIUM: Magnesium: 1.8 mg/dL (ref 1.7–2.4)

## 2022-10-07 MED ORDER — IOHEXOL 350 MG/ML SOLN
75.0000 mL | Freq: Once | INTRAVENOUS | Status: AC | PRN
  Administered 2022-10-07: 75 mL via INTRAVENOUS

## 2022-10-07 MED ORDER — POTASSIUM CHLORIDE CRYS ER 20 MEQ PO TBCR
40.0000 meq | EXTENDED_RELEASE_TABLET | Freq: Once | ORAL | Status: AC
  Administered 2022-10-07: 40 meq via ORAL
  Filled 2022-10-07: qty 2

## 2022-10-07 MED ORDER — TOLVAPTAN 15 MG PO TABS
15.0000 mg | ORAL_TABLET | Freq: Once | ORAL | Status: AC
  Administered 2022-10-07: 15 mg via ORAL
  Filled 2022-10-07: qty 1

## 2022-10-07 NOTE — Consult Note (Signed)
[facility]  CONSULT NOTE    Date: 10/07/2022                  Patient Name:  Nicolas Solis  MRN: [id number]  DOB: 09-24-1950  Age / Sex: 72 y.o., male         PCP: Tracie Harrier, MD                 Service Requesting Consult: [place]                 Reason for Consult: Hyponatremia            History of Present Illness: Mr. Nicolas Solis is a 72 y.o.  male with past medical history of Parkinson's, tobacco and alcohol abuse, who was admitted to [facility] on 10/06/2022 for Hyponatremia [E87.1] Fall, initial encounter [W19.XXXA] Closed fracture of transverse process of lumbar vertebra, initial encounter ([place]) [S32.009A] Closed fracture of multiple ribs of left side, initial encounter [S22.42XA] Closed fracture of sacrum, unspecified portion of sacrum, initial encounter [facility]) [S32.10XA]  Patient presents to the emergency department complaining of weakness and falls.  Patient is seen at bedside with wife.  Patient states he suffered a fall a few days prior resulting in back and hip pain.  Wife states patient fell off of steps, fell straight back from the second step.  Wife states husband initially complained of soreness but was able to get around.  It was when she was unable to assist him out of the bed she encouraged him to come to the emergency department.  Wife states he refused up until that time.  Patient states he does not drink bourbon, 4 ounces daily.  Denies nausea or vomiting.  Denies shortness of breath or cough.  Denies chest or abdominal pain.  Complains of lower back and hip pain.  CT lumbar spine shows an acute nondisplaced fracture at the S1 transverse process and joint.  Chest x-ray shows fracture anterior lateral left sixth through 10th ribs.  Serum labs significant for sodium 122, glucose 125.  UA appears clear.  Respiratory panel negative.   Medications: Outpatient medications: Medications Prior to Admission  Medication Sig Dispense Refill  Last Dose   acetaminophen (TYLENOL) 500 MG tablet Take 1,000 mg by mouth every 6 (six) hours as needed (for pain.).   unknown   aspirin EC 81 MG tablet Take 1 tablet (81 mg total) by mouth daily. Swallow whole. 90 tablet 3 10/05/2022   atorvastatin (LIPITOR) 20 MG tablet Take 1 tablet (20 mg total) by mouth daily. 90 tablet 2 10/05/2022   carbidopa-levodopa (SINEMET IR) 25-100 MG tablet Take 2 tablets by mouth 3 (three) times daily before meals.   10/06/2022 at 0800   Carbidopa-Levodopa ER (SINEMET CR) 25-100 MG tablet controlled release Take 1 tablet by mouth at bedtime.   10/05/2022 at 2100   Cholecalciferol (VITAMIN D3) 50 MCG (2000 UT) TABS Take 2,000 Units by mouth in the morning.   10/05/2022   Cyanocobalamin (VITAMIN B-12) 5000 MCG LOZG Take 2,500 mcg by mouth every evening.   10/05/2022   dorzolamide-timolol (COSOPT) 22.3-6.8 MG/ML ophthalmic solution Place 1 drop into both eyes daily.   10/05/2022   ibuprofen (ADVIL,MOTRIN) 200 MG tablet Take 400-600 mg by mouth daily as needed for headache or moderate pain.   unknown   Multiple Vitamins-Minerals (ADULT ONE DAILY GUMMIES PO) Take 2 tablets by mouth every evening.   10/05/2022   nitroGLYCERIN (NITROSTAT) 0.4 MG SL tablet  Place 1 tablet (0.4 mg total) under the tongue every 5 (five) minutes as needed for chest pain. For a max of 3 doses in 1 day. 30 tablet 1 unknown   Omega-3 Fatty Acids (FISH OIL PO) Take 1,000 mg by mouth in the morning.   10/05/2022   ranolazine (RANEXA) 500 MG 12 hr tablet Take 1 tablet (500 mg total) by mouth 2 (two) times daily. 180 tablet 2 10/05/2022   tamsulosin (FLOMAX) 0.4 MG CAPS capsule Take 0.4 mg by mouth daily after supper.   10/05/2022   nicotine polacrilex (NICORETTE) 2 MG gum Take 0.25 each by mouth every 2 (two) hours as needed for smoking cessation. (Patient not taking: Reported on 10/06/2022)   Not Taking   nystatin powder Apply 1 Application topically as needed (irritation). (Patient not taking: Reported on 09/26/2022)        Current medications: Current Facility-Administered Medications  Medication Dose Route Frequency Provider Last Rate Last Admin   0.9 %  sodium chloride infusion   Intravenous Continuous Cox, Amy N, DO 125 mL/hr at 10/07/22 1154 New Bag at 10/07/22 1154   acetaminophen (TYLENOL) tablet 650 mg  650 mg Oral Q6H PRN Cox, Amy N, DO       Or   acetaminophen (TYLENOL) suppository 650 mg  650 mg Rectal Q6H PRN Cox, Amy N, DO       atorvastatin (LIPITOR) tablet 20 mg  20 mg Oral QHS Cox, Amy N, DO   20 mg at 10/06/22 2029   carbidopa-levodopa (SINEMET IR) 25-100 MG per tablet immediate release 2 tablet  2 tablet Oral TID AC Cox, Amy N, DO   2 tablet at 10/07/22 1239   Carbidopa-Levodopa ER (SINEMET CR) 25-100 MG tablet controlled release 1 tablet  1 tablet Oral QHS Cox, Amy N, DO   1 tablet at 10/06/22 2029   cholecalciferol (VITAMIN D3) 25 MCG (1000 UNIT) tablet 2,000 Units  2,000 Units Oral q AM Cox, Amy N, DO   2,000 Units at 10/07/22 0924   cyanocobalamin (VITAMIN B12) tablet 2,500 mcg  2,500 mcg Oral QPM Cox, Amy N, DO   2,500 mcg at 10/06/22 1833   dorzolamide (TRUSOPT) 2 % ophthalmic solution 1 drop  1 drop Both Eyes Daily Darrick Penna, RPH   1 drop at 10/07/22 1241   And   timolol (TIMOPTIC) 0.5 % ophthalmic solution 1 drop  1 drop Both Eyes Daily Darrick Penna, RPH   1 drop at 10/07/22 1240   enoxaparin (LOVENOX) injection 40 mg  40 mg Subcutaneous Q24H Cox, Amy N, DO   40 mg at [id number]   folic acid (FOLVITE) tablet 1 mg  1 mg Oral Daily Cox, Amy N, DO   1 mg at 10/07/22 0924   ketorolac (TORADOL) 15 MG/ML injection 15 mg  15 mg Intravenous Q6H PRN Ralene Muskrat B, MD   15 mg at 10/07/22 0050   lidocaine (LIDODERM) 5 % 1 patch  1 patch Transdermal Q24H Cox, Amy N, DO   1 patch at 10/07/22 1239   LORazepam (ATIVAN) injection 2 mg  2 mg Intravenous Q6H PRN Cox, Amy N, DO   2 mg at 10/07/22 0050   morphine (PF) 2 MG/ML injection 2 mg  2 mg Intravenous Q4H PRN Ralene Muskrat B, MD   2 mg at 10/07/22 0003   nitroGLYCERIN (NITROSTAT) SL tablet 0.4 mg  0.4 mg Sublingual Q5 min PRN Cox, Amy N, DO   0.4 mg at  10/07/22 1421   ondansetron (ZOFRAN) tablet 4 mg  4 mg Oral Q6H PRN Cox, Amy N, DO       Or   ondansetron (ZOFRAN) injection 4 mg  4 mg Intravenous Q6H PRN Cox, Amy N, DO       ranolazine (RANEXA) 12 hr tablet 500 mg  500 mg Oral BID Cox, Amy N, DO   500 mg at 10/07/22 [id number]   senna-docusate (Senokot-S) tablet 1 tablet  1 tablet Oral QHS PRN Cox, Amy N, DO       tamsulosin (FLOMAX) capsule 0.4 mg  0.4 mg Oral QPC supper Cox, Amy N, DO   0.4 mg at 10/06/22 [id number]      Allergies: No Known Allergies    Past Medical History: Past Medical History:  Diagnosis Date   Compression fracture of first cervical vertebra (HCC)    Essential tremor    Ileus (HCC)      Past Surgical History: Past Surgical History:  Procedure Laterality Date   25 GAUGE PARS PLANA VITRECTOMY WITH 20 GAUGE MVR PORT Right 03/18/2017   25 GAUGE PARS PLANA VITRECTOMY WITH 20 GAUGE MVR PORT FOR MACULAR HOLE Right 03/18/2017   Procedure: 25 GAUGE PARS PLANA VITRECTOMY WITH 20 GAUGE MVR PORT FOR MACULAR HOLE; PHOTOCOAGULATION RIGHT EYE;  Surgeon: Hayden Pedro, MD;  Location: [place];  Service: Ophthalmology;  Laterality: Right;   APPENDECTOMY     COLONOSCOPY W/ POLYPECTOMY     GAS INSERTION Right 03/18/2017   Procedure: INSERTION OF GAS RIGHT (C3F8);  Surgeon: Hayden Pedro, MD;  Location: [place];  Service: Ophthalmology;  Laterality: Right;   GAS/FLUID EXCHANGE Right 03/18/2017   Procedure: GAS/FLUID EXCHANGE RIGHT EYE;  Surgeon: Hayden Pedro, MD;  Location: [place];  Service: Ophthalmology;  Laterality: Right;   JOINT REPLACEMENT Right    Hip   LEFT HEART CATH AND CORONARY ANGIOGRAPHY N/A 02/06/2022   Procedure: LEFT HEART CATH AND CORONARY ANGIOGRAPHY;  Surgeon: Wellington Hampshire, MD;  Location: [place] [facility];  Service: Cardiovascular;  Laterality: N/A;   MEMBRANE PEEL Right  03/18/2017   Procedure: MEMBRANE PEEL RIGHT EYE;  Surgeon: Hayden Pedro, MD;  Location: [place];  Service: Ophthalmology;  Laterality: Right;   MOUTH SURGERY     PHOTOCOAGULATION Left 03/18/2017   Procedure: PHOTOCOAGULATION LEFT EYE;  Surgeon: Hayden Pedro, MD;  Location: [place];  Service: Ophthalmology;  Laterality: Left;   SERUM PATCH Right 03/18/2017   Procedure: SERUM PATCH RIGHT EYE;  Surgeon: Hayden Pedro, MD;  Location: [place];  Service: Ophthalmology;  Laterality: Right;     Family History: Family History  Problem Relation Age of Onset   Stroke Brother      Social History: Social History   Socioeconomic History   Marital status: Married    Spouse name: Not on file   Number of children: Not on file   Years of education: Not on file   Highest education level: Not on file  Occupational History   Not on file  Tobacco Use   Smoking status: Former    Years: 50.00    Types: Cigarettes    Quit date: 10/2015    Years since quitting: 6.9    Passive exposure: Past   Smokeless tobacco: Never  Vaping Use   Vaping Use: Never used  Substance and Sexual Activity   Alcohol use: Yes    Alcohol/week: 7.0 standard drinks of alcohol    Types: 7 Shots of  liquor per week   Drug use: No   Sexual activity: Not Currently  Other Topics Concern   Not on file  Social History Narrative   Not on file   Social Determinants of Health   Financial Resource Strain: Not on file  Food Insecurity: No Food Insecurity (10/06/2022)   Hunger Vital Sign    Worried About Running Out of Food in the Last Year: Never true    Ran Out of Food in the Last Year: Never true  Transportation Needs: No Transportation Needs (10/06/2022)   PRAPARE - Hydrologist (Medical): No    Lack of Transportation (Non-Medical): No  Physical Activity: Not on file  Stress: Not on file  Social Connections: Not on file  Intimate Partner Violence: Not At Risk (10/06/2022)   Humiliation,  Afraid, Rape, and Kick questionnaire    Fear of Current or Ex-Partner: No    Emotionally Abused: No    Physically Abused: No    Sexually Abused: No     Review of Systems: Review of Systems  Constitutional:  Negative for chills, fever and malaise/fatigue.  HENT:  Negative for congestion, sore throat and tinnitus.   Eyes:  Negative for blurred vision and redness.  Respiratory:  Negative for cough, shortness of breath and wheezing.   Cardiovascular:  Negative for chest pain, palpitations, claudication and leg swelling.  Gastrointestinal:  Negative for abdominal pain, blood in stool, diarrhea, nausea and vomiting.  Genitourinary:  Negative for flank pain, frequency and hematuria.  Musculoskeletal:  Positive for falls. Negative for back pain and myalgias.  Skin:  Negative for rash.  Neurological:  Positive for weakness. Negative for dizziness and headaches.  Endo/Heme/Allergies:  Does not bruise/bleed easily.  Psychiatric/Behavioral:  Negative for depression. The patient is not nervous/anxious and does not have insomnia.     Vital Signs: Blood pressure 127/71, pulse 79, temperature 97.8 F (36.6 C), temperature source Oral, resp. rate 18, height 6' (1.829 m), weight 80.9 kg, SpO2 97 %.  Weight trends: Filed Weights   10/06/22 1227  Weight: 80.9 kg    Physical Exam: General: NAD, resting comfortably  Head: Normocephalic, atraumatic. Moist oral mucosal membranes  Eyes: Anicteric  Lungs:  Clear to auscultation, normal effort  Heart: Regular rate and rhythm  Abdomen:  Soft, nontender, nondistended  Extremities: No peripheral edema.  Neurologic: Alert and oriented, moving all four extremities  Skin: No lesions  Access: None     Lab results: Basic Metabolic Panel: Recent Labs  Lab 10/06/22 1231 10/06/22 1445 10/06/22 1717 10/07/22 0115 10/07/22 0336 10/07/22 1333  NA 121*  --    < > 123* 122* 123*  K 3.7  --   --   --  3.6 3.8  CL 88*  --   --   --  91* 91*  CO2 24   --   --   --  21* 26  GLUCOSE 125*  --   --   --  112* 114*  BUN 9  --   --   --  10 9  CREATININE 0.43*  --   --   --  0.48* 0.44*  CALCIUM 8.5*  --   --   --  7.7* 8.4*  MG  --  1.6*  --   --  1.8  --   PHOS  --  3.0  --   --   --   --    < > = values in this interval  not displayed.    Liver Function Tests: Recent Labs  Lab 10/07/22 0936 10/07/22 1333  AST 49* 49*  ALT 14 6  ALKPHOS 83 91  BILITOT 0.8 0.7  PROT 6.3* 7.9  ALBUMIN 3.2* 3.8   No results for input(s): "LIPASE", "AMYLASE" in the last 168 hours. No results for input(s): "AMMONIA" in the last 168 hours.  CBC: Recent Labs  Lab 10/06/22 1231 10/07/22 0336  WBC 6.2 5.3  NEUTROABS 4.6  --   HGB 12.7* 11.0*  HCT 35.5* 30.5*  MCV 96.7 97.1  PLT 218 193    Cardiac Enzymes: No results for input(s): "CKTOTAL", "CKMB", "CKMBINDEX", "TROPONINI" in the last 168 hours.  BNP: Invalid input(s): "POCBNP"  CBG: No results for input(s): "GLUCAP" in the last 168 hours.  Microbiology: Results for orders placed or performed during the hospital encounter of 10/06/22  Resp panel by RT-PCR (RSV, Flu A&B, Covid) Anterior Nasal Swab     Status: None   Collection Time: 10/06/22 12:31 PM   Specimen: Anterior Nasal Swab  Result Value Ref Range Status   SARS Coronavirus 2 by RT PCR NEGATIVE NEGATIVE Final    Comment: (NOTE) SARS-CoV-2 target nucleic acids are NOT DETECTED.  The SARS-CoV-2 RNA is generally detectable in upper respiratory specimens during the acute phase of infection. The lowest concentration of SARS-CoV-2 viral copies this assay can detect is 138 copies/mL. A negative result does not preclude SARS-Cov-2 infection and should not be used as the sole basis for treatment or other patient management decisions. A negative result may occur with  improper specimen collection/handling, submission of specimen other than nasopharyngeal swab, presence of viral mutation(s) within the areas targeted by this assay, and  inadequate number of viral copies(<138 copies/mL). A negative result must be combined with clinical observations, patient history, and epidemiological information. The expected result is Negative.  Fact Sheet for Patients:  EntrepreneurPulse.com.au  Fact Sheet for Healthcare Providers:  IncredibleEmployment.be  This test is no t yet approved or cleared by the [place] FDA and  has been authorized for detection and/or diagnosis of SARS-CoV-2 by FDA under an Emergency Use Authorization (EUA). This EUA will remain  in effect (meaning this test can be used) for the duration of the COVID-19 declaration under Section 564(b)(1) of the Act, 21 U.S.C.section 360bbb-3(b)(1), unless the authorization is terminated  or revoked sooner.       Influenza A by PCR NEGATIVE NEGATIVE Final   Influenza B by PCR NEGATIVE NEGATIVE Final    Comment: (NOTE) The Xpert Xpress SARS-CoV-2/FLU/RSV plus assay is intended as an aid in the diagnosis of influenza from Nasopharyngeal swab specimens and should not be used as a sole basis for treatment. Nasal washings and aspirates are unacceptable for Xpert Xpress SARS-CoV-2/FLU/RSV testing.  Fact Sheet for Patients: EntrepreneurPulse.com.au  Fact Sheet for Healthcare Providers: IncredibleEmployment.be  This test is not yet approved or cleared by the [place] FDA and has been authorized for detection and/or diagnosis of SARS-CoV-2 by FDA under an Emergency Use Authorization (EUA). This EUA will remain in effect (meaning this test can be used) for the duration of the COVID-19 declaration under Section 564(b)(1) of the Act, 21 U.S.C. section 360bbb-3(b)(1), unless the authorization is terminated or revoked.     Resp Syncytial Virus by PCR NEGATIVE NEGATIVE Final    Comment: (NOTE) Fact Sheet for Patients: EntrepreneurPulse.com.au  Fact Sheet for Healthcare  Providers: IncredibleEmployment.be  This test is not yet approved or cleared by the [place] and  has been authorized for detection and/or diagnosis of SARS-CoV-2 by FDA under an Emergency Use Authorization (EUA). This EUA will remain in effect (meaning this test can be used) for the duration of the COVID-19 declaration under Section 564(b)(1) of the Act, 21 U.S.C. section 360bbb-3(b)(1), unless the authorization is terminated or revoked.  Performed at [facility], [address] [place], [place] [address]     Coagulation Studies: No results for input(s): "LABPROT", "INR" in the last 72 hours.  Urinalysis: Recent Labs    10/06/22 1338  COLORURINE YELLOW*  LABSPEC 1.011  PHURINE 7.0  GLUCOSEU NEGATIVE  HGBUR NEGATIVE  BILIRUBINUR NEGATIVE  KETONESUR 5*  PROTEINUR NEGATIVE  NITRITE NEGATIVE  LEUKOCYTESUR NEGATIVE      Imaging: CT Head Wo Contrast  Addendum Date: 10/06/2022   ADDENDUM REPORT: 10/06/2022 14:57 ADDENDUM: Correction: The left transverse fracture described in the findings and impression as at S1 is actually at L5. Also, in addition to the left sacral ala fracture described in the report there also is a nondisplaced S2 vertebral body fracture. This was discussed with Sheran Luz via telephone at 2:56 PM. Electronically Signed   By: Margaretha Sheffield M.D.   On: 10/06/2022 14:57   Result Date: 10/06/2022 CLINICAL DATA:  Head trauma, minor (Age >= 65y); Back trauma, no prior imaging (Age >= 16y); Neck trauma (Age >= 65y) EXAM: CT HEAD WITHOUT CONTRAST CT CERVICAL SPINE WITHOUT CONTRAST CT LUMBAR SPINE WITHOUT CONTRAST TECHNIQUE: Multidetector CT imaging of the head, cervical and lumbar spine was performed following the standard protocol without intravenous contrast. Multiplanar CT image reconstructions of the cervical spine were also generated. RADIATION DOSE REDUCTION: This exam was performed according to the departmental  dose-optimization program which includes automated exposure control, adjustment of the mA and/or kV according to patient size and/or use of iterative reconstruction technique. COMPARISON:  None Available. FINDINGS: CT HEAD FINDINGS Brain: No evidence of acute infarction, hemorrhage, hydrocephalus, extra-axial collection or mass lesion/mass effect. Mild for age patchy white matter hypodensities, compatible with chronic microvascular ischemic disease. Vascular: No hyperdense vessel identified. Skull: No acute fracture. Sinuses/Orbits: Mild paranasal sinus mucosal thickening. No acute orbital findings. CT CERVICAL SPINE FINDINGS Alignment: Mild reversal and mild likely degenerative anterolisthesis of C4 on C5. Otherwise, no substantial sagittal subluxation Skull base and vertebrae: Mild superior endplate irregularity and height loss of the T1 and T2 vertebral bodies, age indeterminate. Cervical vertebral body heights are maintained Soft tissues and spinal canal: No prevertebral fluid or swelling. No visible canal hematoma. Disc levels: Multilevel facet uncovertebral hypertrophy with varying degrees of neural foraminal stenosis. Upper chest: Visualized lung apices are clear. Other: Calcific atherosclerosis. CT LUMBAR SPINE FINDINGS Alignment: Similar alignment.  No new sagittal subluxation. Vertebrae: Chronic L1 burst fracture with mild bony retropulsion with similar severe height loss centrally. No progressive height loss. No increase in bony retropulsion. Additional vertebral body heights are similar to the prior. Acute nondisplaced fracture through the left S1 transverse process (for example see series 4, image 98). Acute nondisplaced fracture through the left sacral ala which extensive left SI joint (for example see series 4, image 111). Soft tissues and spinal canal: No prevertebral fluid or swelling. No visible canal hematoma. Disc levels: Severe multifocal degenerative change including facet arthropathy with  varying degrees of neural foraminal stenosis. Paraspinal: Similar versus minimally increased 31 mm infrarenal abdominal aortic aneurysm, partially imaged. Calcific atherosclerosis. IMPRESSION: CT lumbar spine: 1. Acute nondisplaced fracture through the left S1 transverse process. 2. Acute nondisplaced fracture through  the left sacral ala which extensive left SI joint. 3. No substantial change in chronic severe L1 burst fracture with mild bony retropulsion and severe central height loss. 4. Similar versus minimally increased 31 mm infrarenal abdominal aortic aneurysm, partially imaged. Recommend follow-up ultrasound every 3 years. This recommendation follows ACR consensus guidelines: White Paper of the ACR Incidental Findings Committee II on Vascular Findings. J Am Coll Radiol 2013JB:[id number]. 5. Aortic Atherosclerosis (ICD10-I70.0). CT thoracic spine: Mild superior endplate irregularity and height loss of the T1 and T2 vertebral bodies, age indeterminate. Recommend correlation with the presence or absence of point tenderness. MRI of the thoracic spine could better assess chronicity if clinically warranted. CT head: No acute intracranial abnormality. Electronically Signed: By: Margaretha Sheffield M.D. On: 10/06/2022 14:51   CT Cervical Spine Wo Contrast  Addendum Date: 10/06/2022   ADDENDUM REPORT: 10/06/2022 14:57 ADDENDUM: Correction: The left transverse fracture described in the findings and impression as at S1 is actually at L5. Also, in addition to the left sacral ala fracture described in the report there also is a nondisplaced S2 vertebral body fracture. This was discussed with Sheran Luz via telephone at 2:56 PM. Electronically Signed   By: Margaretha Sheffield M.D.   On: 10/06/2022 14:57   Result Date: 10/06/2022 CLINICAL DATA:  Head trauma, minor (Age >= 65y); Back trauma, no prior imaging (Age >= 16y); Neck trauma (Age >= 65y) EXAM: CT HEAD WITHOUT CONTRAST CT CERVICAL SPINE WITHOUT CONTRAST CT LUMBAR  SPINE WITHOUT CONTRAST TECHNIQUE: Multidetector CT imaging of the head, cervical and lumbar spine was performed following the standard protocol without intravenous contrast. Multiplanar CT image reconstructions of the cervical spine were also generated. RADIATION DOSE REDUCTION: This exam was performed according to the departmental dose-optimization program which includes automated exposure control, adjustment of the mA and/or kV according to patient size and/or use of iterative reconstruction technique. COMPARISON:  None Available. FINDINGS: CT HEAD FINDINGS Brain: No evidence of acute infarction, hemorrhage, hydrocephalus, extra-axial collection or mass lesion/mass effect. Mild for age patchy white matter hypodensities, compatible with chronic microvascular ischemic disease. Vascular: No hyperdense vessel identified. Skull: No acute fracture. Sinuses/Orbits: Mild paranasal sinus mucosal thickening. No acute orbital findings. CT CERVICAL SPINE FINDINGS Alignment: Mild reversal and mild likely degenerative anterolisthesis of C4 on C5. Otherwise, no substantial sagittal subluxation Skull base and vertebrae: Mild superior endplate irregularity and height loss of the T1 and T2 vertebral bodies, age indeterminate. Cervical vertebral body heights are maintained Soft tissues and spinal canal: No prevertebral fluid or swelling. No visible canal hematoma. Disc levels: Multilevel facet uncovertebral hypertrophy with varying degrees of neural foraminal stenosis. Upper chest: Visualized lung apices are clear. Other: Calcific atherosclerosis. CT LUMBAR SPINE FINDINGS Alignment: Similar alignment.  No new sagittal subluxation. Vertebrae: Chronic L1 burst fracture with mild bony retropulsion with similar severe height loss centrally. No progressive height loss. No increase in bony retropulsion. Additional vertebral body heights are similar to the prior. Acute nondisplaced fracture through the left S1 transverse process (for  example see series 4, image 98). Acute nondisplaced fracture through the left sacral ala which extensive left SI joint (for example see series 4, image 111). Soft tissues and spinal canal: No prevertebral fluid or swelling. No visible canal hematoma. Disc levels: Severe multifocal degenerative change including facet arthropathy with varying degrees of neural foraminal stenosis. Paraspinal: Similar versus minimally increased 31 mm infrarenal abdominal aortic aneurysm, partially imaged. Calcific atherosclerosis. IMPRESSION: CT lumbar spine: 1. Acute nondisplaced fracture through the left S1  transverse process. 2. Acute nondisplaced fracture through the left sacral ala which extensive left SI joint. 3. No substantial change in chronic severe L1 burst fracture with mild bony retropulsion and severe central height loss. 4. Similar versus minimally increased 31 mm infrarenal abdominal aortic aneurysm, partially imaged. Recommend follow-up ultrasound every 3 years. This recommendation follows ACR consensus guidelines: White Paper of the ACR Incidental Findings Committee II on Vascular Findings. J Am Coll Radiol [id number]. 5. Aortic Atherosclerosis (ICD10-I70.0). CT thoracic spine: Mild superior endplate irregularity and height loss of the T1 and T2 vertebral bodies, age indeterminate. Recommend correlation with the presence or absence of point tenderness. MRI of the thoracic spine could better assess chronicity if clinically warranted. CT head: No acute intracranial abnormality. Electronically Signed: By: Margaretha Sheffield M.D. On: 10/06/2022 14:51   CT Lumbar Spine Wo Contrast  Addendum Date: 10/06/2022   ADDENDUM REPORT: 10/06/2022 14:57 ADDENDUM: Correction: The left transverse fracture described in the findings and impression as at S1 is actually at L5. Also, in addition to the left sacral ala fracture described in the report there also is a nondisplaced S2 vertebral body fracture. This was discussed with  Sheran Luz via telephone at 2:56 PM. Electronically Signed   By: Margaretha Sheffield M.D.   On: 10/06/2022 14:57   Result Date: 10/06/2022 CLINICAL DATA:  Head trauma, minor (Age >= 65y); Back trauma, no prior imaging (Age >= 16y); Neck trauma (Age >= 65y) EXAM: CT HEAD WITHOUT CONTRAST CT CERVICAL SPINE WITHOUT CONTRAST CT LUMBAR SPINE WITHOUT CONTRAST TECHNIQUE: Multidetector CT imaging of the head, cervical and lumbar spine was performed following the standard protocol without intravenous contrast. Multiplanar CT image reconstructions of the cervical spine were also generated. RADIATION DOSE REDUCTION: This exam was performed according to the departmental dose-optimization program which includes automated exposure control, adjustment of the mA and/or kV according to patient size and/or use of iterative reconstruction technique. COMPARISON:  None Available. FINDINGS: CT HEAD FINDINGS Brain: No evidence of acute infarction, hemorrhage, hydrocephalus, extra-axial collection or mass lesion/mass effect. Mild for age patchy white matter hypodensities, compatible with chronic microvascular ischemic disease. Vascular: No hyperdense vessel identified. Skull: No acute fracture. Sinuses/Orbits: Mild paranasal sinus mucosal thickening. No acute orbital findings. CT CERVICAL SPINE FINDINGS Alignment: Mild reversal and mild likely degenerative anterolisthesis of C4 on C5. Otherwise, no substantial sagittal subluxation Skull base and vertebrae: Mild superior endplate irregularity and height loss of the T1 and T2 vertebral bodies, age indeterminate. Cervical vertebral body heights are maintained Soft tissues and spinal canal: No prevertebral fluid or swelling. No visible canal hematoma. Disc levels: Multilevel facet uncovertebral hypertrophy with varying degrees of neural foraminal stenosis. Upper chest: Visualized lung apices are clear. Other: Calcific atherosclerosis. CT LUMBAR SPINE FINDINGS Alignment: Similar alignment.  No  new sagittal subluxation. Vertebrae: Chronic L1 burst fracture with mild bony retropulsion with similar severe height loss centrally. No progressive height loss. No increase in bony retropulsion. Additional vertebral body heights are similar to the prior. Acute nondisplaced fracture through the left S1 transverse process (for example see series 4, image 98). Acute nondisplaced fracture through the left sacral ala which extensive left SI joint (for example see series 4, image 111). Soft tissues and spinal canal: No prevertebral fluid or swelling. No visible canal hematoma. Disc levels: Severe multifocal degenerative change including facet arthropathy with varying degrees of neural foraminal stenosis. Paraspinal: Similar versus minimally increased 31 mm infrarenal abdominal aortic aneurysm, partially imaged. Calcific atherosclerosis. IMPRESSION: CT lumbar spine: 1.  Acute nondisplaced fracture through the left S1 transverse process. 2. Acute nondisplaced fracture through the left sacral ala which extensive left SI joint. 3. No substantial change in chronic severe L1 burst fracture with mild bony retropulsion and severe central height loss. 4. Similar versus minimally increased 31 mm infrarenal abdominal aortic aneurysm, partially imaged. Recommend follow-up ultrasound every 3 years. This recommendation follows ACR consensus guidelines: White Paper of the ACR Incidental Findings Committee II on Vascular Findings. J Am Coll Radiol [id number]. 5. Aortic Atherosclerosis (ICD10-I70.0). CT thoracic spine: Mild superior endplate irregularity and height loss of the T1 and T2 vertebral bodies, age indeterminate. Recommend correlation with the presence or absence of point tenderness. MRI of the thoracic spine could better assess chronicity if clinically warranted. CT head: No acute intracranial abnormality. Electronically Signed: By: Margaretha Sheffield M.D. On: 10/06/2022 14:51   DG Ribs Unilateral W/Chest Left  Result  Date: 10/06/2022 CLINICAL DATA:  LEFT chest pain following fall 3 weeks ago. Initial encounter. EXAM: LEFT RIBS AND CHEST - 3+ VIEW COMPARISON:  01/13/2022 radiograph FINDINGS: This is a low volume study. The cardiomediastinal silhouette is unremarkable. There is no evidence of focal airspace disease, pulmonary edema, suspicious pulmonary nodule/mass, pleural effusion, or pneumothorax. There are fractures of the anterolateral LEFT 6th through 10th ribs. The 6th and 7th ribs may be subacute to remote. The other fractures are acute. IMPRESSION: Fractures of the anterolateral LEFT 6th through 10th ribs. No pneumothorax or pleural effusion. No acute cardiopulmonary disease. Electronically Signed   By: Margarette Canada M.D.   On: 10/06/2022 13:20     Assessment & Plan: Mr. PAYSON LAUNER is a 72 y.o.  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  Patient has received normal saline infusion without adequate response.  Sodium 121 on admission, 123 today.  Liver enzymes acceptable.  Will order 1 dose tolvaptan 15 mg today and reassess sodium levels tomorrow.    LOS: 1 Ragena Fiola 3/11/20243:47 PM

## 2022-10-07 NOTE — Progress Notes (Signed)
PROGRESS NOTE    Nicolas Solis  [id number] DOB: 27-Jan-1951 DOA: 10/06/2022 PCP: Tracie Harrier, MD    Brief Narrative:  72 year old male with history of hyperlipidemia, Parkinson's on Sinemet, former tobacco user 50+ years, history of L1 compression fracture, who presents emergency department for chief concerns of weakness and imbalance.   Of note, patient fell at home about 2 to 3 weeks ago hitting his head on the stair banister while walking up the stairs.  Patient fell and hit his head on the banister 3 weeks ago.  Since then he has been feeling weak.  He denies overt pain until Friday.  He denies numbness in his lower extremities.  At bedside he is able to move all his toes and pulses are intact.   He reports being constipated. He reports last BM was two days ago (Friday), and it was a good brown bowel movement. He denies chest pain, shortness of breath,abdominal pain, dysuria, hematuria, diarrhea.   Patient denies trauma to his person.   He drinks two shots of bourbon nightly. He reports poor PO intake otherwise Patient mated for medical evaluation and treatment of hyponatremia.  Neurosurgery contacted for emergency room given L1 vertebral fracture, left rib fracture, sacral and left sacral alla fracture.  Assessment & Plan:   Principal Problem:   Hyponatremia Active Problems:   Coronary artery disease   Parkinson's disease   Hyperlipidemia   Frequent falls   Hypomagnesemia   Alcohol abuse   Left rib fracture   L1 vertebral fracture (HCC)   Hyponatremia Patient has been on fairly aggressive IV crystalloid therapy without improvement in serum sodium.  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 Plan: On IV fluids.  Continue for now Nephrology contacted, will order LFTs to consider administration of tolvaptan Check CT thorax with IV contrast for metastatic disease evaluation considering smoking history and hyponatremia  L1 vertebral fracture (HCC) With left S1  transverse fracture Dr. Cari Caraway states he will discuss with his orthopedic trauma colleagues regarding the sacral and left sacral ala fracture Symptomatic support Status post brace in place Continue outpatient follow-up with neurosurgery   Left rib fracture Left anterolateral 6th-10th ribs Incentive spirometry and flutter valve ordered Lidocaine patch daily ordered Toradol 15 mg IV every 6 hours as needed for moderate pain Morphine 2 mg IV every 4 hours as needed for severe pain   Alcohol abuse Patient has been drinking at least 2 drinks of bourbon nightly despite poor p.o. intake over the last 2 weeks Counseled patient on this excessive EtOH intake in setting of poor p.o. intake otherwise Timing 100 mg p.o. daily, folic acid 1 mg daily ordered Ativan 2 mg IV every 6 hours.  For anxiety   Hypomagnesemia Monitor and replace as necessary   Frequent falls With increasing weakness and poor PO intake Presumed secondary to hyponatremia, fall precaution Optimize lites lites PT, OT, TOC were consulted   Parkinson's disease Carbidopa-levodopa immediate release 25-100 mg tablet, 2 tablets 3 times daily before meals (spouse request 30 minutes before each meal) Carbidopa-levodopa controlled release 25-100, 1 tablet nightly were resumed   DVT prophylaxis: SQ Lovenox Code Status: Full Family Communication: Wife at bedside 3/11 Disposition Plan: Status is: Inpatient Remains inpatient appropriate because: Hyponatremia, unknown timeline.  Intractable pain.  Multiple fractures.   Level of care: Progressive  Consultants:  Nephrology Neurosurgery  Procedures:  None  Antimicrobials: None   Subjective: Seen and examined.  Flattened affect.  Resting in bed.  No visible distress  Objective: Vitals:   10/06/22 2358 10/07/22 0354 10/07/22 0744 10/07/22 1125  BP: (!) 152/80 (!) 147/77 (!) 149/75 (!) 150/85  Pulse: 78 74 75 82  Resp: '18 17 16 18  '$ Temp: 98.2 F (36.8 C) (!) 97.4  F (36.3 C) 97.7 F (36.5 C) 97.8 F (36.6 C)  TempSrc: Oral Axillary Oral Oral  SpO2: 96% 97% 96% 97%  Weight:      Height:        Intake/Output Summary (Last 24 hours) at 10/07/2022 1243 Last data filed at 10/07/2022 1035 Gross per 24 hour  Intake 1100 ml  Output 1475 ml  Net -375 ml   Filed Weights   10/06/22 1227  Weight: 80.9 kg    Examination:  General exam: NAD Respiratory system: Lungs clear.  Normal work of breathing.  Room air Cardiovascular system: S1-S2, RRR, no murmurs, no pedal edema Gastrointestinal system: Soft, NT/ND, normal bowel sounds Central nervous system: Alert, oriented x 3, no focal deficits Extremities: Decreased power bilateral lower extremities Skin: No rashes, lesions or ulcers Psychiatry: Judgement and insight appear normal. Mood & affect flattened.     Data Reviewed: I have personally reviewed following labs and imaging studies  CBC: Recent Labs  Lab 10/06/22 1231 10/07/22 0336  WBC 6.2 5.3  NEUTROABS 4.6  --   HGB 12.7* 11.0*  HCT 35.5* 30.5*  MCV 96.7 97.1  PLT 218 0000000   Basic Metabolic Panel: Recent Labs  Lab 10/06/22 1231 10/06/22 1445 10/06/22 1717 10/06/22 1836 10/06/22 2136 10/06/22 2311 10/07/22 0115 10/07/22 0336  NA 121*  --    < > 121* 122* 121* 123* 122*  K 3.7  --   --   --   --   --   --  3.6  CL 88*  --   --   --   --   --   --  91*  CO2 24  --   --   --   --   --   --  21*  GLUCOSE 125*  --   --   --   --   --   --  112*  BUN 9  --   --   --   --   --   --  10  CREATININE 0.43*  --   --   --   --   --   --  0.48*  CALCIUM 8.5*  --   --   --   --   --   --  7.7*  MG  --  1.6*  --   --   --   --   --  1.8  PHOS  --  3.0  --   --   --   --   --   --    < > = values in this interval not displayed.   GFR: Estimated Creatinine Clearance: 91.6 mL/min (A) (by C-G formula based on SCr of 0.48 mg/dL (L)). Liver Function Tests: Recent Labs  Lab 10/07/22 0936  AST 49*  ALT 14  ALKPHOS 83  BILITOT 0.8   PROT 6.3*  ALBUMIN 3.2*   No results for input(s): "LIPASE", "AMYLASE" in the last 168 hours. No results for input(s): "AMMONIA" in the last 168 hours. Coagulation Profile: No results for input(s): "INR", "PROTIME" in the last 168 hours. Cardiac Enzymes: No results for input(s): "CKTOTAL", "CKMB", "CKMBINDEX", "TROPONINI" in the last 168 hours. BNP (last 3 results) No results for input(s): "PROBNP"  in the last 8760 hours. HbA1C: No results for input(s): "HGBA1C" in the last 72 hours. CBG: No results for input(s): "GLUCAP" in the last 168 hours. Lipid Profile: Recent Labs    10/06/22 1552  TRIG 78   Thyroid Function Tests: No results for input(s): "TSH", "T4TOTAL", "FREET4", "T3FREE", "THYROIDAB" in the last 72 hours. Anemia Panel: No results for input(s): "VITAMINB12", "FOLATE", "FERRITIN", "TIBC", "IRON", "RETICCTPCT" in the last 72 hours. Sepsis Labs: No results for input(s): "PROCALCITON", "LATICACIDVEN" in the last 168 hours.  Recent Results (from the past 240 hour(s))  Resp panel by RT-PCR (RSV, Flu A&B, Covid) Anterior Nasal Swab     Status: None   Collection Time: 10/06/22 12:31 PM   Specimen: Anterior Nasal Swab  Result Value Ref Range Status   SARS Coronavirus 2 by RT PCR NEGATIVE NEGATIVE Final    Comment: (NOTE) SARS-CoV-2 target nucleic acids are NOT DETECTED.  The SARS-CoV-2 RNA is generally detectable in upper respiratory specimens during the acute phase of infection. The lowest concentration of SARS-CoV-2 viral copies this assay can detect is 138 copies/mL. A negative result does not preclude SARS-Cov-2 infection and should not be used as the sole basis for treatment or other patient management decisions. A negative result may occur with  improper specimen collection/handling, submission of specimen other than nasopharyngeal swab, presence of viral mutation(s) within the areas targeted by this assay, and inadequate number of viral copies(<138 copies/mL).  A negative result must be combined with clinical observations, patient history, and epidemiological information. The expected result is Negative.  Fact Sheet for Patients:  EntrepreneurPulse.com.au  Fact Sheet for Healthcare Providers:  IncredibleEmployment.be  This test is no t yet approved or cleared by the [place] FDA and  has been authorized for detection and/or diagnosis of SARS-CoV-2 by FDA under an Emergency Use Authorization (EUA). This EUA will remain  in effect (meaning this test can be used) for the duration of the COVID-19 declaration under Section 564(b)(1) of the Act, 21 U.S.C.section 360bbb-3(b)(1), unless the authorization is terminated  or revoked sooner.       Influenza A by PCR NEGATIVE NEGATIVE Final   Influenza B by PCR NEGATIVE NEGATIVE Final    Comment: (NOTE) The Xpert Xpress SARS-CoV-2/FLU/RSV plus assay is intended as an aid in the diagnosis of influenza from Nasopharyngeal swab specimens and should not be used as a sole basis for treatment. Nasal washings and aspirates are unacceptable for Xpert Xpress SARS-CoV-2/FLU/RSV testing.  Fact Sheet for Patients: EntrepreneurPulse.com.au  Fact Sheet for Healthcare Providers: IncredibleEmployment.be  This test is not yet approved or cleared by the [place] FDA and has been authorized for detection and/or diagnosis of SARS-CoV-2 by FDA under an Emergency Use Authorization (EUA). This EUA will remain in effect (meaning this test can be used) for the duration of the COVID-19 declaration under Section 564(b)(1) of the Act, 21 U.S.C. section 360bbb-3(b)(1), unless the authorization is terminated or revoked.     Resp Syncytial Virus by PCR NEGATIVE NEGATIVE Final    Comment: (NOTE) Fact Sheet for Patients: EntrepreneurPulse.com.au  Fact Sheet for Healthcare  Providers: IncredibleEmployment.be  This test is not yet approved or cleared by the [place] FDA and has been authorized for detection and/or diagnosis of SARS-CoV-2 by FDA under an Emergency Use Authorization (EUA). This EUA will remain in effect (meaning this test can be used) for the duration of the COVID-19 declaration under Section 564(b)(1) of the Act, 21 U.S.C. section 360bbb-3(b)(1), unless the authorization is terminated or  revoked.  Performed at [facility], [address], [place], [place] [address]          Radiology Studies: CT Head Wo Contrast  Addendum Date: 10/06/2022   ADDENDUM REPORT: 10/06/2022 14:57 ADDENDUM: Correction: The left transverse fracture described in the findings and impression as at S1 is actually at L5. Also, in addition to the left sacral ala fracture described in the report there also is a nondisplaced S2 vertebral body fracture. This was discussed with Sheran Luz via telephone at 2:56 PM. Electronically Signed   By: Margaretha Sheffield M.D.   On: 10/06/2022 14:57   Result Date: 10/06/2022 CLINICAL DATA:  Head trauma, minor (Age >= 65y); Back trauma, no prior imaging (Age >= 16y); Neck trauma (Age >= 65y) EXAM: CT HEAD WITHOUT CONTRAST CT CERVICAL SPINE WITHOUT CONTRAST CT LUMBAR SPINE WITHOUT CONTRAST TECHNIQUE: Multidetector CT imaging of the head, cervical and lumbar spine was performed following the standard protocol without intravenous contrast. Multiplanar CT image reconstructions of the cervical spine were also generated. RADIATION DOSE REDUCTION: This exam was performed according to the departmental dose-optimization program which includes automated exposure control, adjustment of the mA and/or kV according to patient size and/or use of iterative reconstruction technique. COMPARISON:  None Available. FINDINGS: CT HEAD FINDINGS Brain: No evidence of acute infarction, hemorrhage, hydrocephalus, extra-axial collection  or mass lesion/mass effect. Mild for age patchy white matter hypodensities, compatible with chronic microvascular ischemic disease. Vascular: No hyperdense vessel identified. Skull: No acute fracture. Sinuses/Orbits: Mild paranasal sinus mucosal thickening. No acute orbital findings. CT CERVICAL SPINE FINDINGS Alignment: Mild reversal and mild likely degenerative anterolisthesis of C4 on C5. Otherwise, no substantial sagittal subluxation Skull base and vertebrae: Mild superior endplate irregularity and height loss of the T1 and T2 vertebral bodies, age indeterminate. Cervical vertebral body heights are maintained Soft tissues and spinal canal: No prevertebral fluid or swelling. No visible canal hematoma. Disc levels: Multilevel facet uncovertebral hypertrophy with varying degrees of neural foraminal stenosis. Upper chest: Visualized lung apices are clear. Other: Calcific atherosclerosis. CT LUMBAR SPINE FINDINGS Alignment: Similar alignment.  No new sagittal subluxation. Vertebrae: Chronic L1 burst fracture with mild bony retropulsion with similar severe height loss centrally. No progressive height loss. No increase in bony retropulsion. Additional vertebral body heights are similar to the prior. Acute nondisplaced fracture through the left S1 transverse process (for example see series 4, image 98). Acute nondisplaced fracture through the left sacral ala which extensive left SI joint (for example see series 4, image 111). Soft tissues and spinal canal: No prevertebral fluid or swelling. No visible canal hematoma. Disc levels: Severe multifocal degenerative change including facet arthropathy with varying degrees of neural foraminal stenosis. Paraspinal: Similar versus minimally increased 31 mm infrarenal abdominal aortic aneurysm, partially imaged. Calcific atherosclerosis. IMPRESSION: CT lumbar spine: 1. Acute nondisplaced fracture through the left S1 transverse process. 2. Acute nondisplaced fracture through the  left sacral ala which extensive left SI joint. 3. No substantial change in chronic severe L1 burst fracture with mild bony retropulsion and severe central height loss. 4. Similar versus minimally increased 31 mm infrarenal abdominal aortic aneurysm, partially imaged. Recommend follow-up ultrasound every 3 years. This recommendation follows ACR consensus guidelines: White Paper of the ACR Incidental Findings Committee II on Vascular Findings. J Am Coll Radiol 2013JB:[id number]. 5. Aortic Atherosclerosis (ICD10-I70.0). CT thoracic spine: Mild superior endplate irregularity and height loss of the T1 and T2 vertebral bodies, age indeterminate. Recommend correlation with the presence or absence of point  tenderness. MRI of the thoracic spine could better assess chronicity if clinically warranted. CT head: No acute intracranial abnormality. Electronically Signed: By: Margaretha Sheffield M.D. On: 10/06/2022 14:51   CT Cervical Spine Wo Contrast  Addendum Date: 10/06/2022   ADDENDUM REPORT: 10/06/2022 14:57 ADDENDUM: Correction: The left transverse fracture described in the findings and impression as at S1 is actually at L5. Also, in addition to the left sacral ala fracture described in the report there also is a nondisplaced S2 vertebral body fracture. This was discussed with Sheran Luz via telephone at 2:56 PM. Electronically Signed   By: Margaretha Sheffield M.D.   On: 10/06/2022 14:57   Result Date: 10/06/2022 CLINICAL DATA:  Head trauma, minor (Age >= 65y); Back trauma, no prior imaging (Age >= 16y); Neck trauma (Age >= 65y) EXAM: CT HEAD WITHOUT CONTRAST CT CERVICAL SPINE WITHOUT CONTRAST CT LUMBAR SPINE WITHOUT CONTRAST TECHNIQUE: Multidetector CT imaging of the head, cervical and lumbar spine was performed following the standard protocol without intravenous contrast. Multiplanar CT image reconstructions of the cervical spine were also generated. RADIATION DOSE REDUCTION: This exam was performed according to the  departmental dose-optimization program which includes automated exposure control, adjustment of the mA and/or kV according to patient size and/or use of iterative reconstruction technique. COMPARISON:  None Available. FINDINGS: CT HEAD FINDINGS Brain: No evidence of acute infarction, hemorrhage, hydrocephalus, extra-axial collection or mass lesion/mass effect. Mild for age patchy white matter hypodensities, compatible with chronic microvascular ischemic disease. Vascular: No hyperdense vessel identified. Skull: No acute fracture. Sinuses/Orbits: Mild paranasal sinus mucosal thickening. No acute orbital findings. CT CERVICAL SPINE FINDINGS Alignment: Mild reversal and mild likely degenerative anterolisthesis of C4 on C5. Otherwise, no substantial sagittal subluxation Skull base and vertebrae: Mild superior endplate irregularity and height loss of the T1 and T2 vertebral bodies, age indeterminate. Cervical vertebral body heights are maintained Soft tissues and spinal canal: No prevertebral fluid or swelling. No visible canal hematoma. Disc levels: Multilevel facet uncovertebral hypertrophy with varying degrees of neural foraminal stenosis. Upper chest: Visualized lung apices are clear. Other: Calcific atherosclerosis. CT LUMBAR SPINE FINDINGS Alignment: Similar alignment.  No new sagittal subluxation. Vertebrae: Chronic L1 burst fracture with mild bony retropulsion with similar severe height loss centrally. No progressive height loss. No increase in bony retropulsion. Additional vertebral body heights are similar to the prior. Acute nondisplaced fracture through the left S1 transverse process (for example see series 4, image 98). Acute nondisplaced fracture through the left sacral ala which extensive left SI joint (for example see series 4, image 111). Soft tissues and spinal canal: No prevertebral fluid or swelling. No visible canal hematoma. Disc levels: Severe multifocal degenerative change including facet  arthropathy with varying degrees of neural foraminal stenosis. Paraspinal: Similar versus minimally increased 31 mm infrarenal abdominal aortic aneurysm, partially imaged. Calcific atherosclerosis. IMPRESSION: CT lumbar spine: 1. Acute nondisplaced fracture through the left S1 transverse process. 2. Acute nondisplaced fracture through the left sacral ala which extensive left SI joint. 3. No substantial change in chronic severe L1 burst fracture with mild bony retropulsion and severe central height loss. 4. Similar versus minimally increased 31 mm infrarenal abdominal aortic aneurysm, partially imaged. Recommend follow-up ultrasound every 3 years. This recommendation follows ACR consensus guidelines: White Paper of the ACR Incidental Findings Committee II on Vascular Findings. J Am Coll Radiol [id number]. 5. Aortic Atherosclerosis (ICD10-I70.0). CT thoracic spine: Mild superior endplate irregularity and height loss of the T1 and T2 vertebral bodies, age indeterminate. Recommend correlation  with the presence or absence of point tenderness. MRI of the thoracic spine could better assess chronicity if clinically warranted. CT head: No acute intracranial abnormality. Electronically Signed: By: Margaretha Sheffield M.D. On: 10/06/2022 14:51   CT Lumbar Spine Wo Contrast  Addendum Date: 10/06/2022   ADDENDUM REPORT: 10/06/2022 14:57 ADDENDUM: Correction: The left transverse fracture described in the findings and impression as at S1 is actually at L5. Also, in addition to the left sacral ala fracture described in the report there also is a nondisplaced S2 vertebral body fracture. This was discussed with Sheran Luz via telephone at 2:56 PM. Electronically Signed   By: Margaretha Sheffield M.D.   On: 10/06/2022 14:57   Result Date: 10/06/2022 CLINICAL DATA:  Head trauma, minor (Age >= 65y); Back trauma, no prior imaging (Age >= 16y); Neck trauma (Age >= 65y) EXAM: CT HEAD WITHOUT CONTRAST CT CERVICAL SPINE WITHOUT  CONTRAST CT LUMBAR SPINE WITHOUT CONTRAST TECHNIQUE: Multidetector CT imaging of the head, cervical and lumbar spine was performed following the standard protocol without intravenous contrast. Multiplanar CT image reconstructions of the cervical spine were also generated. RADIATION DOSE REDUCTION: This exam was performed according to the departmental dose-optimization program which includes automated exposure control, adjustment of the mA and/or kV according to patient size and/or use of iterative reconstruction technique. COMPARISON:  None Available. FINDINGS: CT HEAD FINDINGS Brain: No evidence of acute infarction, hemorrhage, hydrocephalus, extra-axial collection or mass lesion/mass effect. Mild for age patchy white matter hypodensities, compatible with chronic microvascular ischemic disease. Vascular: No hyperdense vessel identified. Skull: No acute fracture. Sinuses/Orbits: Mild paranasal sinus mucosal thickening. No acute orbital findings. CT CERVICAL SPINE FINDINGS Alignment: Mild reversal and mild likely degenerative anterolisthesis of C4 on C5. Otherwise, no substantial sagittal subluxation Skull base and vertebrae: Mild superior endplate irregularity and height loss of the T1 and T2 vertebral bodies, age indeterminate. Cervical vertebral body heights are maintained Soft tissues and spinal canal: No prevertebral fluid or swelling. No visible canal hematoma. Disc levels: Multilevel facet uncovertebral hypertrophy with varying degrees of neural foraminal stenosis. Upper chest: Visualized lung apices are clear. Other: Calcific atherosclerosis. CT LUMBAR SPINE FINDINGS Alignment: Similar alignment.  No new sagittal subluxation. Vertebrae: Chronic L1 burst fracture with mild bony retropulsion with similar severe height loss centrally. No progressive height loss. No increase in bony retropulsion. Additional vertebral body heights are similar to the prior. Acute nondisplaced fracture through the left S1 transverse  process (for example see series 4, image 98). Acute nondisplaced fracture through the left sacral ala which extensive left SI joint (for example see series 4, image 111). Soft tissues and spinal canal: No prevertebral fluid or swelling. No visible canal hematoma. Disc levels: Severe multifocal degenerative change including facet arthropathy with varying degrees of neural foraminal stenosis. Paraspinal: Similar versus minimally increased 31 mm infrarenal abdominal aortic aneurysm, partially imaged. Calcific atherosclerosis. IMPRESSION: CT lumbar spine: 1. Acute nondisplaced fracture through the left S1 transverse process. 2. Acute nondisplaced fracture through the left sacral ala which extensive left SI joint. 3. No substantial change in chronic severe L1 burst fracture with mild bony retropulsion and severe central height loss. 4. Similar versus minimally increased 31 mm infrarenal abdominal aortic aneurysm, partially imaged. Recommend follow-up ultrasound every 3 years. This recommendation follows ACR consensus guidelines: White Paper of the ACR Incidental Findings Committee II on Vascular Findings. J Am Coll Radiol [id number]. 5. Aortic Atherosclerosis (ICD10-I70.0). CT thoracic spine: Mild superior endplate irregularity and height loss of the T1 and  T2 vertebral bodies, age indeterminate. Recommend correlation with the presence or absence of point tenderness. MRI of the thoracic spine could better assess chronicity if clinically warranted. CT head: No acute intracranial abnormality. Electronically Signed: By: Margaretha Sheffield M.D. On: 10/06/2022 14:51   DG Ribs Unilateral W/Chest Left  Result Date: 10/06/2022 CLINICAL DATA:  LEFT chest pain following fall 3 weeks ago. Initial encounter. EXAM: LEFT RIBS AND CHEST - 3+ VIEW COMPARISON:  01/13/2022 radiograph FINDINGS: This is a low volume study. The cardiomediastinal silhouette is unremarkable. There is no evidence of focal airspace disease, pulmonary  edema, suspicious pulmonary nodule/mass, pleural effusion, or pneumothorax. There are fractures of the anterolateral LEFT 6th through 10th ribs. The 6th and 7th ribs may be subacute to remote. The other fractures are acute. IMPRESSION: Fractures of the anterolateral LEFT 6th through 10th ribs. No pneumothorax or pleural effusion. No acute cardiopulmonary disease. Electronically Signed   By: Margarette Canada M.D.   On: 10/06/2022 13:20        Scheduled Meds:  atorvastatin  20 mg Oral QHS   carbidopa-levodopa  2 tablet Oral TID AC   Carbidopa-Levodopa ER  1 tablet Oral QHS   cholecalciferol  2,000 Units Oral q AM   cyanocobalamin  2,500 mcg Oral QPM   dorzolamide  1 drop Both Eyes Daily   And   timolol  1 drop Both Eyes Daily   enoxaparin (LOVENOX) injection  40 mg Subcutaneous [id number]   folic acid  1 mg Oral Daily   lidocaine  1 patch Transdermal Q24H   ranolazine  500 mg Oral BID   tamsulosin  0.4 mg Oral QPC supper   Continuous Infusions:  sodium chloride 125 mL/hr at 10/07/22 1154     LOS: 1 day       Sidney Ace, MD [facility] Hospitalists   If 7PM-7AM, please contact night-coverage  10/07/2022, 12:43 PM

## 2022-10-07 NOTE — Progress Notes (Signed)
PT Cancellation Note  Patient Details Name: Nicolas Solis MRN: [id number] DOB: 26-Feb-1951   Cancelled Treatment:    Reason Eval/Treat Not Completed: Patient not medically ready: Hold PT per MD pending guidance from ortho regarding the sacral and left sacral ala fractures.  Will attempt to see pt at a future date/time as medically appropriate.    [name] PT, DPT 10/07/22, 9:37 AM

## 2022-10-07 NOTE — Progress Notes (Signed)
OT Cancellation Note  Patient Details Name: Nicolas Solis MRN: [id number] DOB: [birth year]   Cancelled Treatment:    Reason Eval/Treat Not Completed: Patient not medically ready. Per MD, hold OT pending guidance from ortho regarding the sacral and left sacral ala fractures. Will attempt to see pt at a future date/time as medically appropriate.   Josiah Lobo 10/07/2022, 1:14 PM

## 2022-10-08 DIAGNOSIS — W19XXXA Unspecified fall, initial encounter: Secondary | ICD-10-CM | POA: Diagnosis not present

## 2022-10-08 DIAGNOSIS — S32009A Unspecified fracture of unspecified lumbar vertebra, initial encounter for closed fracture: Secondary | ICD-10-CM

## 2022-10-08 DIAGNOSIS — S32058A Other fracture of fifth lumbar vertebra, initial encounter for closed fracture: Secondary | ICD-10-CM

## 2022-10-08 DIAGNOSIS — E871 Hypo-osmolality and hyponatremia: Secondary | ICD-10-CM | POA: Diagnosis not present

## 2022-10-08 LAB — SODIUM: Sodium: 128 mmol/L — ABNORMAL LOW (ref 135–145)

## 2022-10-08 NOTE — Evaluation (Signed)
Occupational Therapy Evaluation Patient Details Name: Nicolas Solis MRN: [id number] DOB: 05-Apr-1951 Today's Date: 10/08/2022   History of Present Illness Pt is a 72 year old male with PMH that includes hyperlipidemia, Parkinson's disease, former tobacco user 50+ years, and L1 compression fracture who presents emergency department for chief concerns of weakness and imbalance.  Of note the patient fell at home about 2 to 3 weeks prior to admission hitting his head on the stair banister while walking up the stairs and since then he has been feeling weak.  MD assessment includes: hyponatremia, L1 vertebral fracture, L rib fractures, alcohol abuse, hypomagnesemia, and falls.   Clinical Impression   Patient presenting with decreased Ind in self care,balance, functional mobility/transfers, endurance, and safety awareness.  Patient lives at home with wife. Prior to fall 3 weeks ago, pt was ambulating with use of SPC and able to perform self care tasks independent. Patient and wife educated on back precautions related to bed mobility and self care tasks. Pt needing mod A for supine <>sit for log roll. Pt with increased pain and RN called for pain medication. Pt stands with mod A from EOB and unable to take any steps. Patient will benefit from acute OT to increase overall independence in the areas of ADLs, functional mobility, and safety awareness in order to safely discharge to next venue of care.      Recommendations for follow up therapy are one component of a multi-disciplinary discharge planning process, led by the attending physician.  Recommendations may be updated based on patient status, additional functional criteria and insurance authorization.   Follow Up Recommendations  Skilled nursing-short term rehab (<3 hours/day)     Assistance Recommended at Discharge Frequent or constant Supervision/Assistance  Patient can return home with the following A lot of help with walking and/or transfers;A  lot of help with bathing/dressing/bathroom;Assist for transportation;Assistance with cooking/housework;Help with stairs or ramp for entrance    Functional Status Assessment  Patient has had a recent decline in their functional status and demonstrates the ability to make significant improvements in function in a reasonable and predictable amount of time.  Equipment Recommendations  Other (comment) (defer to next venue of care)       Precautions / Restrictions Precautions Precautions: Fall Restrictions Weight Bearing Restrictions: Yes RLE Weight Bearing: Weight bearing as tolerated LLE Weight Bearing: Weight bearing as tolerated      Mobility Bed Mobility Overal bed mobility: Needs Assistance Bed Mobility: Sidelying to Sit, Sit to Sidelying   Sidelying to sit: Mod assist     Sit to sidelying: Mod assist General bed mobility comments: Log roll training with mod A for BLE and trunk control and mod multi-modal cues for sequencing    Transfers Overall transfer level: Needs assistance Equipment used: 1 person hand held assist Transfers: Sit to/from Stand Sit to Stand: Mod assist, Max assist           General transfer comment: Pt is tremulous with standing and sitting EOB secondary to increased pain      Balance Overall balance assessment: Needs assistance, History of Falls Sitting-balance support: Bilateral upper extremity supported, Feet supported Sitting balance-Leahy Scale: Good     Standing balance support: Bilateral upper extremity supported, During functional activity, Reliant on assistive device for balance Standing balance-Leahy Scale: Fair                             ADL either performed or assessed with clinical  judgement   ADL Overall ADL's : Needs assistance/impaired                                       General ADL Comments: max A for LB self care     Vision Patient Visual Report: No change from baseline               Pertinent Vitals/Pain Pain Assessment Pain Assessment: 0-10 Pain Score: 10-Worst pain ever Pain Location: back and R hip Pain Descriptors / Indicators: Aching, Grimacing, Moaning Pain Intervention(s): Monitored during session, Repositioned, Patient requesting pain meds-RN notified     Hand Dominance Right   Extremity/Trunk Assessment Upper Extremity Assessment Upper Extremity Assessment: Generalized weakness   Lower Extremity Assessment Lower Extremity Assessment: Generalized weakness       Communication Communication Communication: No difficulties   Cognition Arousal/Alertness: Awake/alert Behavior During Therapy: WFL for tasks assessed/performed Overall Cognitive Status: Within Functional Limits for tasks assessed                                                  Home Living Family/patient expects to be discharged to:: Private residence Living Arrangements: Spouse/significant other Available Help at Discharge: Family;Available 24 hours/day Type of Home: House Home Access: Stairs to enter [employer] of Steps: 5 Entrance Stairs-Rails: Right;Left Home Layout: Two level;Bed/bath upstairs;1/2 bath on main level Alternate Level Stairs-Number of Steps: flight Alternate Level Stairs-Rails:  (unknown) Bathroom Shower/Tub: Walk-in shower         Home Equipment: Cane - single [employer] (2 wheels);Rollator (4 wheels)          Prior Functioning/Environment Prior Level of Function : Independent/Modified Independent             Mobility Comments: Typically Mod Ind amb with a SPC but has been using a combination of a RW and rollator for home mobility since his fall 2-3 weeks ago ADLs Comments: Ind with ADLs prior to fall 2-3 weeks ago. Wife performs IADLs.        OT Problem List: Decreased strength;Pain;Decreased activity tolerance;Decreased safety awareness;Impaired balance (sitting and/or standing);Decreased knowledge of  use of DME or AE;Decreased knowledge of precautions      OT Treatment/Interventions: Self-care/ADL training;Therapeutic exercise;Therapeutic activities;Energy conservation;DME and/or AE instruction;Patient/family education;Balance training    OT Goals(Current goals can be found in the care plan section) Acute Rehab OT Goals Patient Stated Goal: to decrease pain OT Goal Formulation: With patient Time For Goal Achievement: 10/22/22 Potential to Achieve Goals: Good ADL Goals Pt Will Perform Upper Body Bathing: with supervision;sitting Pt Will Perform Lower Body Dressing: with min guard assist;sit to/from stand Pt Will Transfer to Toilet: with min guard assist;ambulating Pt Will Perform Toileting - Clothing Manipulation and hygiene: with min guard assist;sit to/from stand  OT Frequency: Min 2X/week       AM-PAC OT "6 Clicks" Daily Activity     Outcome Measure Help from another person eating meals?: None Help from another person taking care of personal grooming?: A Little Help from another person toileting, which includes using toliet, bedpan, or urinal?: A Lot Help from another person bathing (including washing, rinsing, drying)?: A Lot Help from another person to put on and taking off regular upper body clothing?: A Little Help from another  person to put on and taking off regular lower body clothing?: A Lot 6 Click Score: 16   End of Session Equipment Utilized During Treatment: Rolling walker (2 wheels) Nurse Communication: Mobility status  Activity Tolerance: Patient limited by pain Patient left: in bed;with bed alarm set  OT Visit Diagnosis: Unsteadiness on feet (R26.81);Repeated falls (R29.6);Muscle weakness (generalized) (M62.81);History of falling (Z91.81)                Time: 1015-1040 OT Time Calculation (min): 25 min Charges:  OT General Charges $OT Visit: 1 Visit OT Evaluation $OT Eval Moderate Complexity: 1 Mod OT Treatments $Self Care/Home Management : 8-22  mins Darleen Crocker, MS, OTR/L , CBIS ascom [phone]  10/08/22, 1:56 PM

## 2022-10-08 NOTE — TOC Progression Note (Signed)
Transition of Care [facility]) - Progression Note    Patient Details  Name: Nicolas Solis MRN: [id number] Date of Birth: September 11, 1950  Transition of Care [facility]) CM/SW Contact  Laurena Slimmer, RN Phone Number: 10/08/2022, 4:07 PM  Clinical Narrative:    Nicolas Solis with patient regarding therapy recommendation for SNF. Patient is agreeable to SNF. He would prefer [facility] PASSR obtained FL2 completed Bed search initiated         Expected Discharge Plan and Services                                               Social Determinants of Health (SDOH) Interventions SDOH Screenings   Food Insecurity: No Food Insecurity (10/06/2022)  Housing: Low Risk  (10/06/2022)  Transportation Needs: No Transportation Needs (10/06/2022)  Utilities: Not At Risk (10/06/2022)  Tobacco Use: Medium Risk (10/07/2022)    Readmission Risk Interventions     No data to display

## 2022-10-08 NOTE — Evaluation (Signed)
Physical Therapy Evaluation Patient Details Name: Nicolas Solis MRN: [id number] DOB: 1951-06-03 Today's Date: 10/08/2022  History of Present Illness  Pt is a 72 year old male with PMH that includes hyperlipidemia, Parkinson's disease, former tobacco user 50+ years, and L1 compression fracture who presents emergency department for chief concerns of weakness and imbalance.  Of note the patient fell at home about 2 to 3 weeks prior to admission hitting his head on the stair banister while walking up the stairs and since then he has been feeling weak.  MD assessment includes: hyponatremia, L1 vertebral fracture, L rib fractures, alcohol abuse, hypomagnesemia, and falls.   Clinical Impression  Pt was pleasant and put forth good effort during the session but ultimately was quite limited functionally by pain despite being pre-medicated.  Pt required physical assistance with bed mobility tasks during log roll training as well as during sit to stand and required the bed to be elevated to come to standing.  Pt was only able to amb a max of 5' near the EOB and to the chair with antalgic, step-to pattern but no overt LOB or buckling.  Pt will benefit from PT services in a SNF setting upon discharge to safely address deficits listed in patient problem list for decreased caregiver assistance and eventual return to PLOF.         Recommendations for follow up therapy are one component of a multi-disciplinary discharge planning process, led by the attending physician.  Recommendations may be updated based on patient status, additional functional criteria and insurance authorization.  Follow Up Recommendations Skilled nursing-short term rehab (<3 hours/day) Can patient physically be transported by private vehicle: No    Assistance Recommended at Discharge Frequent or constant Supervision/Assistance  Patient can return home with the following  A lot of help with walking and/or transfers;A lot of help with  bathing/dressing/bathroom;Assistance with cooking/housework;Assist for transportation;Help with stairs or ramp for entrance    Equipment Recommendations Other (comment) (TBD at next venue of care)  Recommendations for Other Services       Functional Status Assessment Patient has had a recent decline in their functional status and demonstrates the ability to make significant improvements in function in a reasonable and predictable amount of time.     Precautions / Restrictions Precautions Precautions: Fall Restrictions Weight Bearing Restrictions: Yes RLE Weight Bearing: Weight bearing as tolerated LLE Weight Bearing: Weight bearing as tolerated      Mobility  Bed Mobility Overal bed mobility: Needs Assistance Bed Mobility: Rolling, Sidelying to Sit, Sit to Sidelying Rolling: Min assist Sidelying to sit: Mod assist     Sit to sidelying: Mod assist General bed mobility comments: Log roll training with min to mod A for BLE and trunk control and mod multi-modal cues for sequencing    Transfers Overall transfer level: Needs assistance Equipment used: Rolling walker (2 wheels) Transfers: Sit to/from Stand Sit to Stand: Min assist, From elevated surface           General transfer comment: Mod verbal cues for sequencing with bed elevated and min A to come to full upright standing    Ambulation/Gait Ambulation/Gait assistance: Min guard Gait Distance (Feet): 5 Feet Assistive device: Rolling walker (2 wheels) Gait Pattern/deviations: Step-to pattern, Decreased step length - left, Decreased stance time - right, Antalgic, Trunk flexed Gait velocity: decreased     General Gait Details: Pt reported R hip pain > L while in standing with step-to sequencing education provided to address pain; pt only able to  amb a max of around 5 feet near the EOB and to the chair before needing to return to sitting; no overt LOB or buckling during gait  Stairs            Wheelchair  Mobility    Modified Rankin (Stroke Patients Only)       Balance Overall balance assessment: Needs assistance, History of Falls Sitting-balance support: Bilateral upper extremity supported, Feet supported Sitting balance-Leahy Scale: Good     Standing balance support: Bilateral upper extremity supported, During functional activity, Reliant on assistive device for balance Standing balance-Leahy Scale: Fair                               Pertinent Vitals/Pain Pain Assessment Pain Assessment: 0-10 Pain Score: 8  Pain Location: back and R hip Pain Descriptors / Indicators: Aching, Grimacing, Moaning Pain Intervention(s): Repositioned, Premedicated before session, Monitored during session, Limited activity within patient's tolerance    Home Living Family/patient expects to be discharged to:: Private residence Living Arrangements: Spouse/significant other Available Help at Discharge: Family;Available 24 hours/day Type of Home: House Home Access: Stairs to enter Entrance Stairs-Rails: Right;Left (too wide for both) Entrance Stairs-Number of Steps: 5 Alternate Level Stairs-Number of Steps: flight Home Layout: Two level;Bed/bath upstairs Home Equipment: Cane - single point;Rolling Walker (2 wheels);Rollator (4 wheels)      Prior Function Prior Level of Function : Independent/Modified Independent             Mobility Comments: Typically Mod Ind amb with a SPC but has been using a combination of a RW and rollator for home mobility since his fall 2-3 weeks ago ADLs Comments: Ind with ADLs     Hand Dominance   Dominant Hand: Right    Extremity/Trunk Assessment   Upper Extremity Assessment Upper Extremity Assessment: Generalized weakness    Lower Extremity Assessment Lower Extremity Assessment: Generalized weakness       Communication   Communication: No difficulties  Cognition Arousal/Alertness: Awake/alert Behavior During Therapy: WFL for tasks  assessed/performed Overall Cognitive Status: Within Functional Limits for tasks assessed                                          General Comments      Exercises Other Exercises Other Exercises: log roll training   Assessment/Plan    PT Assessment Patient needs continued PT services  PT Problem List Decreased strength;Decreased activity tolerance;Decreased balance;Decreased mobility;Decreased knowledge of use of DME;Pain       PT Treatment Interventions DME instruction;Gait training;Stair training;Functional mobility training;Therapeutic activities;Therapeutic exercise;Balance training;Patient/family education    PT Goals (Current goals can be found in the Care Plan section)  Acute Rehab PT Goals Patient Stated Goal: Decreased pain PT Goal Formulation: With patient Time For Goal Achievement: 10/21/22 Potential to Achieve Goals: Good    Frequency 7X/week     Co-evaluation               AM-PAC PT "6 Clicks" Mobility  Outcome Measure Help needed turning from your back to your side while in a flat bed without using bedrails?: A Lot Help needed moving from lying on your back to sitting on the side of a flat bed without using bedrails?: A Lot Help needed moving to and from a bed to a chair (including a wheelchair)?: A Lot Help  needed standing up from a chair using your arms (e.g., wheelchair or bedside chair)?: A Little Help needed to walk in hospital room?: A Lot Help needed climbing 3-5 steps with a railing? : Total 6 Click Score: 12    End of Session Equipment Utilized During Treatment: Gait belt Activity Tolerance: Patient limited by pain Patient left: in chair;with call bell/phone within reach;with chair alarm set;with family/visitor present Nurse Communication: Mobility status PT Visit Diagnosis: History of falling (Z91.81);Muscle weakness (generalized) (M62.81);Difficulty in walking, not elsewhere classified (R26.2);Pain Pain - Right/Left:  Right Pain - part of body: Hip (and low back)    Time: [id number] PT Time Calculation (min) (ACUTE ONLY): 28 min   Charges:   PT Evaluation $PT Eval Moderate Complexity: 1 Mod PT Treatments $Therapeutic Activity: 8-22 mins      D. Scott Cheryal Salas PT, DPT 10/08/22, 11:59 AM

## 2022-10-08 NOTE — Progress Notes (Signed)
[facility]  ROUNDING NOTE   Subjective:   Patient seen resting in bed, wife at bedside Untouched breakfast tray at bedside Continues to complain of poor appetite.   Overall appearance has improved.  Sodium 128 Urine output 3.6 L  Objective:  Vital signs in last 24 hours:  Temp:  [97.7 F (36.5 C)-99.4 F (37.4 C)] 97.7 F (36.5 C) (03/12 0835) Pulse Rate:  [79-97] 83 (03/12 0835) Resp:  [16-19] 18 (03/12 0835) BP: (88-155)/(42-81) 153/76 (03/12 0835) SpO2:  [93 %-97 %] 94 % (03/12 0835)  Weight change:  Filed Weights   10/06/22 1227  Weight: 80.9 kg    Intake/Output: I/O last 3 completed shifts: In: 240 [P.O.:240] Out: 4725 [Urine:4725]   Intake/Output this shift:  Total I/O In: 240 [P.O.:240] Out: -   Physical Exam: General: NAD  Head: Normocephalic, atraumatic. Moist oral mucosal membranes  Eyes: Anicteric  Lungs:  Clear to auscultation, normal effort, room air  Heart: Regular rate and rhythm  Abdomen:  Soft, nontender  Extremities: No peripheral edema.  Neurologic: Nonfocal, moving all four extremities  Skin: No lesions  Access: None    Basic Metabolic Panel: Recent Labs  Lab 10/06/22 1231 10/06/22 1445 10/06/22 1717 10/07/22 0115 10/07/22 0336 10/07/22 1333 10/07/22 2037 10/08/22 0436  NA 121*  --    < > 123* 122* 123* 124* 128*  K 3.7  --   --   --  3.6 3.8  --   --   CL 88*  --   --   --  91* 91*  --   --   CO2 24  --   --   --  21* 26  --   --   GLUCOSE 125*  --   --   --  112* 114*  --   --   BUN 9  --   --   --  10 9  --   --   CREATININE 0.43*  --   --   --  0.48* 0.44*  --   --   CALCIUM 8.5*  --   --   --  7.7* 8.4*  --   --   MG  --  1.6*  --   --  1.8  --   --   --   PHOS  --  3.0  --   --   --   --   --   --    < > = values in this interval not displayed.    Liver Function Tests: Recent Labs  Lab 10/07/22 0936 10/07/22 1333  AST 49* 49*  ALT 14 6  ALKPHOS 83 91  BILITOT 0.8 0.7  PROT 6.3* 7.9  ALBUMIN  3.2* 3.8   No results for input(s): "LIPASE", "AMYLASE" in the last 168 hours. No results for input(s): "AMMONIA" in the last 168 hours.  CBC: Recent Labs  Lab 10/06/22 1231 10/07/22 0336  WBC 6.2 5.3  NEUTROABS 4.6  --   HGB 12.7* 11.0*  HCT 35.5* 30.5*  MCV 96.7 97.1  PLT 218 193    Cardiac Enzymes: No results for input(s): "CKTOTAL", "CKMB", "CKMBINDEX", "TROPONINI" in the last 168 hours.  BNP: Invalid input(s): "POCBNP"  CBG: No results for input(s): "GLUCAP" in the last 168 hours.  Microbiology: Results for orders placed or performed during the hospital encounter of 10/06/22  Resp panel by RT-PCR (RSV, Flu A&B, Covid) Anterior Nasal Swab     Status: None   Collection  Time: 10/06/22 12:31 PM   Specimen: Anterior Nasal Swab  Result Value Ref Range Status   SARS Coronavirus 2 by RT PCR NEGATIVE NEGATIVE Final    Comment: (NOTE) SARS-CoV-2 target nucleic acids are NOT DETECTED.  The SARS-CoV-2 RNA is generally detectable in upper respiratory specimens during the acute phase of infection. The lowest concentration of SARS-CoV-2 viral copies this assay can detect is 138 copies/mL. A negative result does not preclude SARS-Cov-2 infection and should not be used as the sole basis for treatment or other patient management decisions. A negative result may occur with  improper specimen collection/handling, submission of specimen other than nasopharyngeal swab, presence of viral mutation(s) within the areas targeted by this assay, and inadequate number of viral copies(<138 copies/mL). A negative result must be combined with clinical observations, patient history, and epidemiological information. The expected result is Negative.  Fact Sheet for Patients:  EntrepreneurPulse.com.au  Fact Sheet for Healthcare Providers:  IncredibleEmployment.be  This test is no t yet approved or cleared by the [place] FDA and  has been authorized  for detection and/or diagnosis of SARS-CoV-2 by FDA under an Emergency Use Authorization (EUA). This EUA will remain  in effect (meaning this test can be used) for the duration of the COVID-19 declaration under Section 564(b)(1) of the Act, 21 U.S.C.section 360bbb-3(b)(1), unless the authorization is terminated  or revoked sooner.       Influenza A by PCR NEGATIVE NEGATIVE Final   Influenza B by PCR NEGATIVE NEGATIVE Final    Comment: (NOTE) The Xpert Xpress SARS-CoV-2/FLU/RSV plus assay is intended as an aid in the diagnosis of influenza from Nasopharyngeal swab specimens and should not be used as a sole basis for treatment. Nasal washings and aspirates are unacceptable for Xpert Xpress SARS-CoV-2/FLU/RSV testing.  Fact Sheet for Patients: EntrepreneurPulse.com.au  Fact Sheet for Healthcare Providers: IncredibleEmployment.be  This test is not yet approved or cleared by the [place] FDA and has been authorized for detection and/or diagnosis of SARS-CoV-2 by FDA under an Emergency Use Authorization (EUA). This EUA will remain in effect (meaning this test can be used) for the duration of the COVID-19 declaration under Section 564(b)(1) of the Act, 21 U.S.C. section 360bbb-3(b)(1), unless the authorization is terminated or revoked.     Resp Syncytial Virus by PCR NEGATIVE NEGATIVE Final    Comment: (NOTE) Fact Sheet for Patients: EntrepreneurPulse.com.au  Fact Sheet for Healthcare Providers: IncredibleEmployment.be  This test is not yet approved or cleared by the [place] FDA and has been authorized for detection and/or diagnosis of SARS-CoV-2 by FDA under an Emergency Use Authorization (EUA). This EUA will remain in effect (meaning this test can be used) for the duration of the COVID-19 declaration under Section 564(b)(1) of the Act, 21 U.S.C. section 360bbb-3(b)(1), unless the authorization is  terminated or revoked.  Performed at [facility], [address] [place], [place] [address]     Coagulation Studies: No results for input(s): "LABPROT", "INR" in the last 72 hours.  Urinalysis: Recent Labs    10/06/22 1338  COLORURINE YELLOW*  LABSPEC 1.011  PHURINE 7.0  GLUCOSEU NEGATIVE  HGBUR NEGATIVE  BILIRUBINUR NEGATIVE  KETONESUR 5*  PROTEINUR NEGATIVE  NITRITE NEGATIVE  LEUKOCYTESUR NEGATIVE      Imaging: CT Angio Chest Pulmonary Embolism (PE) W or WO Contrast  Result Date: 10/07/2022 CLINICAL DATA:  72 year old male with weakness and pain. EXAM: CT ANGIOGRAPHY CHEST WITH CONTRAST TECHNIQUE: Multidetector CT imaging of the chest was performed using the standard  protocol during bolus administration of intravenous contrast. Multiplanar CT image reconstructions and MIPs were obtained to evaluate the vascular anatomy. RADIATION DOSE REDUCTION: This exam was performed according to the departmental dose-optimization program which includes automated exposure control, adjustment of the mA and/or kV according to patient size and/or use of iterative reconstruction technique. CONTRAST:  19m OMNIPAQUE IOHEXOL 350 MG/ML SOLN COMPARISON:  Prior chest radiographs FINDINGS: Cardiovascular: This is a technically adequate study but motion artifact decreases sensitivity in the LOWER lungs. No pulmonary emboli are identified. UPPER limits normal heart size and coronary artery calcifications noted. There is no evidence of thoracic aortic aneurysm. A trace pericardial effusion is present. Mediastinum/Nodes: No enlarged mediastinal, hilar, or axillary lymph nodes. Thyroid gland, trachea, and esophagus demonstrate no significant findings. Lungs/Pleura: A trace LEFT pleural effusion and mild bibasilar atelectasis noted. There is no evidence of airspace disease, consolidation, mass, suspicious nodule or pneumothorax. Upper Abdomen: No acute abnormality. Musculoskeletal: Acute fractures of  the LEFT 6th through 8th ribs noted. More inferior ribs are not visualized on this study. Review of the MIP images confirms the above findings. IMPRESSION: 1. Acute fractures of the LEFT 6th through 8th ribs. More inferior ribs are not visualized on this study. 2. Trace LEFT pleural effusion and mild bibasilar atelectasis. 3. No evidence of pulmonary emboli or thoracic aortic aneurysm. 4. Coronary artery disease Electronically Signed   By: JMargarette CanadaM.D.   On: 10/07/2022 17:58   CT Head Wo Contrast  Addendum Date: 10/06/2022   ADDENDUM REPORT: 10/06/2022 14:57 ADDENDUM: Correction: The left transverse fracture described in the findings and impression as at S1 is actually at L5. Also, in addition to the left sacral ala fracture described in the report there also is a nondisplaced S2 vertebral body fracture. This was discussed with JSheran Luzvia telephone at 2:56 PM. Electronically Signed   By: FMargaretha SheffieldM.D.   On: 10/06/2022 14:57   Result Date: 10/06/2022 CLINICAL DATA:  Head trauma, minor (Age >= 65y); Back trauma, no prior imaging (Age >= 16y); Neck trauma (Age >= 65y) EXAM: CT HEAD WITHOUT CONTRAST CT CERVICAL SPINE WITHOUT CONTRAST CT LUMBAR SPINE WITHOUT CONTRAST TECHNIQUE: Multidetector CT imaging of the head, cervical and lumbar spine was performed following the standard protocol without intravenous contrast. Multiplanar CT image reconstructions of the cervical spine were also generated. RADIATION DOSE REDUCTION: This exam was performed according to the departmental dose-optimization program which includes automated exposure control, adjustment of the mA and/or kV according to patient size and/or use of iterative reconstruction technique. COMPARISON:  None Available. FINDINGS: CT HEAD FINDINGS Brain: No evidence of acute infarction, hemorrhage, hydrocephalus, extra-axial collection or mass lesion/mass effect. Mild for age patchy white matter hypodensities, compatible with chronic  microvascular ischemic disease. Vascular: No hyperdense vessel identified. Skull: No acute fracture. Sinuses/Orbits: Mild paranasal sinus mucosal thickening. No acute orbital findings. CT CERVICAL SPINE FINDINGS Alignment: Mild reversal and mild likely degenerative anterolisthesis of C4 on C5. Otherwise, no substantial sagittal subluxation Skull base and vertebrae: Mild superior endplate irregularity and height loss of the T1 and T2 vertebral bodies, age indeterminate. Cervical vertebral body heights are maintained Soft tissues and spinal canal: No prevertebral fluid or swelling. No visible canal hematoma. Disc levels: Multilevel facet uncovertebral hypertrophy with varying degrees of neural foraminal stenosis. Upper chest: Visualized lung apices are clear. Other: Calcific atherosclerosis. CT LUMBAR SPINE FINDINGS Alignment: Similar alignment.  No new sagittal subluxation. Vertebrae: Chronic L1 burst fracture with mild bony retropulsion with similar severe height  loss centrally. No progressive height loss. No increase in bony retropulsion. Additional vertebral body heights are similar to the prior. Acute nondisplaced fracture through the left S1 transverse process (for example see series 4, image 98). Acute nondisplaced fracture through the left sacral ala which extensive left SI joint (for example see series 4, image 111). Soft tissues and spinal canal: No prevertebral fluid or swelling. No visible canal hematoma. Disc levels: Severe multifocal degenerative change including facet arthropathy with varying degrees of neural foraminal stenosis. Paraspinal: Similar versus minimally increased 31 mm infrarenal abdominal aortic aneurysm, partially imaged. Calcific atherosclerosis. IMPRESSION: CT lumbar spine: 1. Acute nondisplaced fracture through the left S1 transverse process. 2. Acute nondisplaced fracture through the left sacral ala which extensive left SI joint. 3. No substantial change in chronic severe L1 burst  fracture with mild bony retropulsion and severe central height loss. 4. Similar versus minimally increased 31 mm infrarenal abdominal aortic aneurysm, partially imaged. Recommend follow-up ultrasound every 3 years. This recommendation follows ACR consensus guidelines: White Paper of the ACR Incidental Findings Committee II on Vascular Findings. J Am Coll Radiol 2013JB:[id number]. 5. Aortic Atherosclerosis (ICD10-I70.0). CT thoracic spine: Mild superior endplate irregularity and height loss of the T1 and T2 vertebral bodies, age indeterminate. Recommend correlation with the presence or absence of point tenderness. MRI of the thoracic spine could better assess chronicity if clinically warranted. CT head: No acute intracranial abnormality. Electronically Signed: By: Margaretha Sheffield M.D. On: 10/06/2022 14:51   CT Cervical Spine Wo Contrast  Addendum Date: 10/06/2022   ADDENDUM REPORT: 10/06/2022 14:57 ADDENDUM: Correction: The left transverse fracture described in the findings and impression as at S1 is actually at L5. Also, in addition to the left sacral ala fracture described in the report there also is a nondisplaced S2 vertebral body fracture. This was discussed with Sheran Luz via telephone at 2:56 PM. Electronically Signed   By: Margaretha Sheffield M.D.   On: 10/06/2022 14:57   Result Date: 10/06/2022 CLINICAL DATA:  Head trauma, minor (Age >= 65y); Back trauma, no prior imaging (Age >= 16y); Neck trauma (Age >= 65y) EXAM: CT HEAD WITHOUT CONTRAST CT CERVICAL SPINE WITHOUT CONTRAST CT LUMBAR SPINE WITHOUT CONTRAST TECHNIQUE: Multidetector CT imaging of the head, cervical and lumbar spine was performed following the standard protocol without intravenous contrast. Multiplanar CT image reconstructions of the cervical spine were also generated. RADIATION DOSE REDUCTION: This exam was performed according to the departmental dose-optimization program which includes automated exposure control, adjustment of the mA  and/or kV according to patient size and/or use of iterative reconstruction technique. COMPARISON:  None Available. FINDINGS: CT HEAD FINDINGS Brain: No evidence of acute infarction, hemorrhage, hydrocephalus, extra-axial collection or mass lesion/mass effect. Mild for age patchy white matter hypodensities, compatible with chronic microvascular ischemic disease. Vascular: No hyperdense vessel identified. Skull: No acute fracture. Sinuses/Orbits: Mild paranasal sinus mucosal thickening. No acute orbital findings. CT CERVICAL SPINE FINDINGS Alignment: Mild reversal and mild likely degenerative anterolisthesis of C4 on C5. Otherwise, no substantial sagittal subluxation Skull base and vertebrae: Mild superior endplate irregularity and height loss of the T1 and T2 vertebral bodies, age indeterminate. Cervical vertebral body heights are maintained Soft tissues and spinal canal: No prevertebral fluid or swelling. No visible canal hematoma. Disc levels: Multilevel facet uncovertebral hypertrophy with varying degrees of neural foraminal stenosis. Upper chest: Visualized lung apices are clear. Other: Calcific atherosclerosis. CT LUMBAR SPINE FINDINGS Alignment: Similar alignment.  No new sagittal subluxation. Vertebrae: Chronic L1 burst fracture with  mild bony retropulsion with similar severe height loss centrally. No progressive height loss. No increase in bony retropulsion. Additional vertebral body heights are similar to the prior. Acute nondisplaced fracture through the left S1 transverse process (for example see series 4, image 98). Acute nondisplaced fracture through the left sacral ala which extensive left SI joint (for example see series 4, image 111). Soft tissues and spinal canal: No prevertebral fluid or swelling. No visible canal hematoma. Disc levels: Severe multifocal degenerative change including facet arthropathy with varying degrees of neural foraminal stenosis. Paraspinal: Similar versus minimally increased  31 mm infrarenal abdominal aortic aneurysm, partially imaged. Calcific atherosclerosis. IMPRESSION: CT lumbar spine: 1. Acute nondisplaced fracture through the left S1 transverse process. 2. Acute nondisplaced fracture through the left sacral ala which extensive left SI joint. 3. No substantial change in chronic severe L1 burst fracture with mild bony retropulsion and severe central height loss. 4. Similar versus minimally increased 31 mm infrarenal abdominal aortic aneurysm, partially imaged. Recommend follow-up ultrasound every 3 years. This recommendation follows ACR consensus guidelines: White Paper of the ACR Incidental Findings Committee II on Vascular Findings. J Am Coll Radiol 2013JB:[id number]. 5. Aortic Atherosclerosis (ICD10-I70.0). CT thoracic spine: Mild superior endplate irregularity and height loss of the T1 and T2 vertebral bodies, age indeterminate. Recommend correlation with the presence or absence of point tenderness. MRI of the thoracic spine could better assess chronicity if clinically warranted. CT head: No acute intracranial abnormality. Electronically Signed: By: Margaretha Sheffield M.D. On: 10/06/2022 14:51   CT Lumbar Spine Wo Contrast  Addendum Date: 10/06/2022   ADDENDUM REPORT: 10/06/2022 14:57 ADDENDUM: Correction: The left transverse fracture described in the findings and impression as at S1 is actually at L5. Also, in addition to the left sacral ala fracture described in the report there also is a nondisplaced S2 vertebral body fracture. This was discussed with Sheran Luz via telephone at 2:56 PM. Electronically Signed   By: Margaretha Sheffield M.D.   On: 10/06/2022 14:57   Result Date: 10/06/2022 CLINICAL DATA:  Head trauma, minor (Age >= 65y); Back trauma, no prior imaging (Age >= 16y); Neck trauma (Age >= 65y) EXAM: CT HEAD WITHOUT CONTRAST CT CERVICAL SPINE WITHOUT CONTRAST CT LUMBAR SPINE WITHOUT CONTRAST TECHNIQUE: Multidetector CT imaging of the head, cervical and lumbar  spine was performed following the standard protocol without intravenous contrast. Multiplanar CT image reconstructions of the cervical spine were also generated. RADIATION DOSE REDUCTION: This exam was performed according to the departmental dose-optimization program which includes automated exposure control, adjustment of the mA and/or kV according to patient size and/or use of iterative reconstruction technique. COMPARISON:  None Available. FINDINGS: CT HEAD FINDINGS Brain: No evidence of acute infarction, hemorrhage, hydrocephalus, extra-axial collection or mass lesion/mass effect. Mild for age patchy white matter hypodensities, compatible with chronic microvascular ischemic disease. Vascular: No hyperdense vessel identified. Skull: No acute fracture. Sinuses/Orbits: Mild paranasal sinus mucosal thickening. No acute orbital findings. CT CERVICAL SPINE FINDINGS Alignment: Mild reversal and mild likely degenerative anterolisthesis of C4 on C5. Otherwise, no substantial sagittal subluxation Skull base and vertebrae: Mild superior endplate irregularity and height loss of the T1 and T2 vertebral bodies, age indeterminate. Cervical vertebral body heights are maintained Soft tissues and spinal canal: No prevertebral fluid or swelling. No visible canal hematoma. Disc levels: Multilevel facet uncovertebral hypertrophy with varying degrees of neural foraminal stenosis. Upper chest: Visualized lung apices are clear. Other: Calcific atherosclerosis. CT LUMBAR SPINE FINDINGS Alignment: Similar alignment.  No new sagittal  subluxation. Vertebrae: Chronic L1 burst fracture with mild bony retropulsion with similar severe height loss centrally. No progressive height loss. No increase in bony retropulsion. Additional vertebral body heights are similar to the prior. Acute nondisplaced fracture through the left S1 transverse process (for example see series 4, image 98). Acute nondisplaced fracture through the left sacral ala which  extensive left SI joint (for example see series 4, image 111). Soft tissues and spinal canal: No prevertebral fluid or swelling. No visible canal hematoma. Disc levels: Severe multifocal degenerative change including facet arthropathy with varying degrees of neural foraminal stenosis. Paraspinal: Similar versus minimally increased 31 mm infrarenal abdominal aortic aneurysm, partially imaged. Calcific atherosclerosis. IMPRESSION: CT lumbar spine: 1. Acute nondisplaced fracture through the left S1 transverse process. 2. Acute nondisplaced fracture through the left sacral ala which extensive left SI joint. 3. No substantial change in chronic severe L1 burst fracture with mild bony retropulsion and severe central height loss. 4. Similar versus minimally increased 31 mm infrarenal abdominal aortic aneurysm, partially imaged. Recommend follow-up ultrasound every 3 years. This recommendation follows ACR consensus guidelines: White Paper of the ACR Incidental Findings Committee II on Vascular Findings. J Am Coll Radiol [id number]. 5. Aortic Atherosclerosis (ICD10-I70.0). CT thoracic spine: Mild superior endplate irregularity and height loss of the T1 and T2 vertebral bodies, age indeterminate. Recommend correlation with the presence or absence of point tenderness. MRI of the thoracic spine could better assess chronicity if clinically warranted. CT head: No acute intracranial abnormality. Electronically Signed: By: Margaretha Sheffield M.D. On: 10/06/2022 14:51   DG Ribs Unilateral W/Chest Left  Result Date: 10/06/2022 CLINICAL DATA:  LEFT chest pain following fall 3 weeks ago. Initial encounter. EXAM: LEFT RIBS AND CHEST - 3+ VIEW COMPARISON:  01/13/2022 radiograph FINDINGS: This is a low volume study. The cardiomediastinal silhouette is unremarkable. There is no evidence of focal airspace disease, pulmonary edema, suspicious pulmonary nodule/mass, pleural effusion, or pneumothorax. There are fractures of the  anterolateral LEFT 6th through 10th ribs. The 6th and 7th ribs may be subacute to remote. The other fractures are acute. IMPRESSION: Fractures of the anterolateral LEFT 6th through 10th ribs. No pneumothorax or pleural effusion. No acute cardiopulmonary disease. Electronically Signed   By: Margarette Canada M.D.   On: 10/06/2022 13:20     Medications:     atorvastatin  20 mg Oral QHS   carbidopa-levodopa  2 tablet Oral TID AC   Carbidopa-Levodopa ER  1 tablet Oral QHS   cholecalciferol  2,000 Units Oral q AM   cyanocobalamin  2,500 mcg Oral QPM   dorzolamide  1 drop Both Eyes Daily   And   timolol  1 drop Both Eyes Daily   enoxaparin (LOVENOX) injection  40 mg Subcutaneous [id number]   folic acid  1 mg Oral Daily   lidocaine  1 patch Transdermal Q24H   ranolazine  500 mg Oral BID   tamsulosin  0.4 mg Oral QPC supper   acetaminophen **OR** acetaminophen, ketorolac, LORazepam, morphine injection, nitroGLYCERIN, ondansetron **OR** ondansetron (ZOFRAN) IV, senna-docusate  Assessment/ Plan:  Nicolas Solis is a 72 y.o.  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 Patient has received normal saline infusion without adequate response. Sodium 121 on admission. Given 1 dose tolvaptan 15 mg on 10/07/2022 Sodium 128 this morning with 3.6 L of urine noted. Patient encouraged to increase oral intake. Will recheck sodium level in a.m., if decreased will consider salt tabs.     LOS: 2 Nicolas Solis 3/12/202412:02 PM

## 2022-10-08 NOTE — Consult Note (Signed)
Neurosurgery-New Consultation Evaluation 10/08/2022 PHINN STAUFF DB:[id number]  Identifying Statement: KIRKLAND GOTTESMAN is a 72 y.o. male from [place] [id number] with a history of hyperlipidemia, Parkinson's on Sinemet, former tobacco user 50+ years, history of L1 compression fracture   Physician Requesting Consultation: No ref. provider found  History of Present Illness: Nicolas Solis is a 73 y.o presenting with concerns of weakness and imbalance. He reports a fall about 2 weeks ago due to weakness resulting in hitting his head and has felt weak and had difficulty walking since. He states the he does have some moderate left sided low back pain.  He denies any radiating leg pain, numbness or tingling.  Past Medical History:  Past Medical History:  Diagnosis Date   Compression fracture of first cervical vertebra (HCC)    Essential tremor    Ileus (HCC)     Social History: Social History   Socioeconomic History   Marital status: Married    Spouse name: Not on file   Number of children: Not on file   Years of education: Not on file   Highest education level: Not on file  Occupational History   Not on file  Tobacco Use   Smoking status: Former    Years: 50.00    Types: Cigarettes    Quit date: 10/2015    Years since quitting: 6.9    Passive exposure: Past   Smokeless tobacco: Never  Vaping Use   Vaping Use: Never used  Substance and Sexual Activity   Alcohol use: Yes    Alcohol/week: 7.0 standard drinks of alcohol    Types: 7 Shots of liquor per week   Drug use: No   Sexual activity: Not Currently  Other Topics Concern   Not on file  Social History Narrative   Not on file   Social Determinants of Health   Financial Resource Strain: Not on file  Food Insecurity: No Food Insecurity (10/06/2022)   Hunger Vital Sign    Worried About Running Out of Food in the Last Year: Never true    Ran Out of Food in the Last Year: Never true  Transportation Needs: No  Transportation Needs (10/06/2022)   PRAPARE - Hydrologist (Medical): No    Lack of Transportation (Non-Medical): No  Physical Activity: Not on file  Stress: Not on file  Social Connections: Not on file  Intimate Partner Violence: Not At Risk (10/06/2022)   Humiliation, Afraid, Rape, and Kick questionnaire    Fear of Current or Ex-Partner: No    Emotionally Abused: No    Physically Abused: No    Sexually Abused: No   Living arrangements (living alone, with partner): with wife  Family History: Family History  Problem Relation Age of Onset   Stroke Brother     Review of Systems:  Review of Systems - General ROS: Negative Psychological ROS: Negative Ophthalmic ROS: Negative ENT ROS: Negative Hematological and Lymphatic ROS: Negative  Endocrine ROS: Negative Respiratory ROS: Negative Cardiovascular ROS: Negative Gastrointestinal ROS: Negative Genito-Urinary ROS: Negative Musculoskeletal ROS: Negative Neurological ROS: Negative Dermatological ROS: Negative  Physical Exam: BP (!) 156/80 (BP Location: Right Arm)   Pulse 78   Temp 97.9 F (36.6 C) (Oral)   Resp 18   Ht 6' (1.829 m)   Wt 80.9 kg   SpO2 99%   BMI 24.19 kg/m  Body mass index is 24.19 kg/m. Body surface area is 2.03 meters squared. General appearance: Alert, cooperative, in  no acute distress Head: Normocephalic, atraumatic Eyes: Normal, EOM intact Oropharynx: Moist without lesions Neck: Supple, no tenderness Heart: Normal, regular rate and rhythm, without murmur Lungs: Clear to auscultation, good air exchange Abdomen: Soft, nondistended Ext: No edema in LE bilaterally, good distal pulses  Neurologic exam:  Mental status: alertness: alert, orientation: person, place, time, affect: normal Speech: fluent and clear Cranial nerves:  CN II-XII grossly intact Motor:strength symmetric 5/5, normal muscle mass and tone in all extremities and no pronator drift Sensory: mild  decreased sensation to light touch without BLE Reflexes: 2+ and symmetric bilaterally for arms and legs Gait:untested  Laboratory: Results for orders placed or performed during the hospital encounter of 10/06/22  Resp panel by RT-PCR (RSV, Flu A&B, Covid) Anterior Nasal Swab   Specimen: Anterior Nasal Swab  Result Value Ref Range   SARS Coronavirus 2 by RT PCR NEGATIVE NEGATIVE   Influenza A by PCR NEGATIVE NEGATIVE   Influenza B by PCR NEGATIVE NEGATIVE   Resp Syncytial Virus by PCR NEGATIVE NEGATIVE  CBC with Differential  Result Value Ref Range   WBC 6.2 4.0 - 10.5 K/uL   RBC 3.67 (L) 4.22 - 5.81 MIL/uL   Hemoglobin 12.7 (L) 13.0 - 17.0 g/dL   HCT 35.5 (L) 39.0 - 52.0 %   MCV 96.7 80.0 - 100.0 fL   MCH 34.6 (H) 26.0 - 34.0 pg   MCHC 35.8 30.0 - 36.0 g/dL   RDW 12.5 11.5 - 15.5 %   Platelets 218 150 - 400 K/uL   nRBC 0.0 0.0 - 0.2 %   Neutrophils Relative % 73 %   Neutro Abs 4.6 1.7 - 7.7 K/uL   Lymphocytes Relative 14 %   Lymphs Abs 0.9 0.7 - 4.0 K/uL   Monocytes Relative 8 %   Monocytes Absolute 0.5 0.1 - 1.0 K/uL   Eosinophils Relative 3 %   Eosinophils Absolute 0.2 0.0 - 0.5 K/uL   Basophils Relative 1 %   Basophils Absolute 0.0 0.0 - 0.1 K/uL   Immature Granulocytes 1 %   Abs Immature Granulocytes 0.04 0.00 - 0.07 K/uL  Basic metabolic panel  Result Value Ref Range   Sodium 121 (L) 135 - 145 mmol/L   Potassium 3.7 3.5 - 5.1 mmol/L   Chloride 88 (L) 98 - 111 mmol/L   CO2 24 22 - 32 mmol/L   Glucose, Bld 125 (H) 70 - 99 mg/dL   BUN 9 8 - 23 mg/dL   Creatinine, Ser 0.43 (L) 0.61 - 1.24 mg/dL   Calcium 8.5 (L) 8.9 - 10.3 mg/dL   GFR, Estimated >60 >60 mL/min   Anion gap 9 5 - 15  Urinalysis, Routine w reflex microscopic -Urine, Clean Catch  Result Value Ref Range   Color, Urine YELLOW (A) YELLOW   APPearance CLEAR (A) CLEAR   Specific Gravity, Urine 1.011 1.005 - 1.030   pH 7.0 5.0 - 8.0   Glucose, UA NEGATIVE NEGATIVE mg/dL   Hgb urine dipstick NEGATIVE  NEGATIVE   Bilirubin Urine NEGATIVE NEGATIVE   Ketones, ur 5 (A) NEGATIVE mg/dL   Protein, ur NEGATIVE NEGATIVE mg/dL   Nitrite NEGATIVE NEGATIVE   Leukocytes,Ua NEGATIVE NEGATIVE  Sodium, urine, random  Result Value Ref Range   Sodium, Ur 55 mmol/L  Osmolality, urine  Result Value Ref Range   Osmolality, Ur 335 300 - 900 mOsm/kg  Triglycerides  Result Value Ref Range   Triglycerides 78 <150 mg/dL  Osmolality  Result Value Ref Range  Osmolality 263 (L) 275 - 295 mOsm/kg  Magnesium  Result Value Ref Range   Magnesium 1.6 (L) 1.7 - 2.4 mg/dL  Phosphorus  Result Value Ref Range   Phosphorus 3.0 2.5 - 4.6 mg/dL  Basic metabolic panel  Result Value Ref Range   Sodium 122 (L) 135 - 145 mmol/L   Potassium 3.6 3.5 - 5.1 mmol/L   Chloride 91 (L) 98 - 111 mmol/L   CO2 21 (L) 22 - 32 mmol/L   Glucose, Bld 112 (H) 70 - 99 mg/dL   BUN 10 8 - 23 mg/dL   Creatinine, Ser 0.48 (L) 0.61 - 1.24 mg/dL   Calcium 7.7 (L) 8.9 - 10.3 mg/dL   GFR, Estimated >60 >60 mL/min   Anion gap 7 5 - 15  CBC  Result Value Ref Range   WBC 5.3 4.0 - 10.5 K/uL   RBC 3.14 (L) 4.22 - 5.81 MIL/uL   Hemoglobin 11.0 (L) 13.0 - 17.0 g/dL   HCT 30.5 (L) 39.0 - 52.0 %   MCV 97.1 80.0 - 100.0 fL   MCH 35.0 (H) 26.0 - 34.0 pg   MCHC 36.1 (H) 30.0 - 36.0 g/dL   RDW 12.8 11.5 - 15.5 %   Platelets 193 150 - 400 K/uL   nRBC 0.0 0.0 - 0.2 %  Magnesium  Result Value Ref Range   Magnesium 1.8 1.7 - 2.4 mg/dL  Sodium  Result Value Ref Range   Sodium 122 (L) 135 - 145 mmol/L  Sodium  Result Value Ref Range   Sodium 121 (L) 135 - 145 mmol/L  Sodium  Result Value Ref Range   Sodium 122 (L) 135 - 145 mmol/L  Sodium  Result Value Ref Range   Sodium 121 (L) 135 - 145 mmol/L  Sodium  Result Value Ref Range   Sodium 123 (L) 135 - 145 mmol/L  Hepatic function panel  Result Value Ref Range   Total Protein 6.3 (L) 6.5 - 8.1 g/dL   Albumin 3.2 (L) 3.5 - 5.0 g/dL   AST 49 (H) 15 - 41 U/L   ALT 14 0 - 44 U/L    Alkaline Phosphatase 83 38 - 126 U/L   Total Bilirubin 0.8 0.3 - 1.2 mg/dL   Bilirubin, Direct 0.1 0.0 - 0.2 mg/dL   Indirect Bilirubin 0.7 0.3 - 0.9 mg/dL  BASELINE basic metabolic panel - IF NOT already drawn  Result Value Ref Range   Sodium 123 (L) 135 - 145 mmol/L   Potassium 3.8 3.5 - 5.1 mmol/L   Chloride 91 (L) 98 - 111 mmol/L   CO2 26 22 - 32 mmol/L   Glucose, Bld 114 (H) 70 - 99 mg/dL   BUN 9 8 - 23 mg/dL   Creatinine, Ser 0.44 (L) 0.61 - 1.24 mg/dL   Calcium 8.4 (L) 8.9 - 10.3 mg/dL   GFR, Estimated >60 >60 mL/min   Anion gap 6 5 - 15  Sodium  Result Value Ref Range   Sodium 124 (L) 135 - 145 mmol/L  Hepatic function panel (Baseline)  Result Value Ref Range   Total Protein 7.9 6.5 - 8.1 g/dL   Albumin 3.8 3.5 - 5.0 g/dL   AST 49 (H) 15 - 41 U/L   ALT 6 0 - 44 U/L   Alkaline Phosphatase 91 38 - 126 U/L   Total Bilirubin 0.7 0.3 - 1.2 mg/dL   Bilirubin, Direct 0.1 0.0 - 0.2 mg/dL   Indirect Bilirubin 0.6 0.3 - 0.9 mg/dL  Sodium  Result Value Ref Range   Sodium 128 (L) 135 - 145 mmol/L  Troponin I (High Sensitivity)  Result Value Ref Range   Troponin I (High Sensitivity) 15 <18 ng/L  Troponin I (High Sensitivity)  Result Value Ref Range   Troponin I (High Sensitivity) 15 <18 ng/L  Troponin I (High Sensitivity)  Result Value Ref Range   Troponin I (High Sensitivity) 14 <18 ng/L    Imaging:  I personally reviewed radiology studies to include:  CT lumbar spine 10/06/22 IMPRESSION: CT lumbar spine:   1. Acute nondisplaced fracture through the left S1 transverse process. 2. Acute nondisplaced fracture through the left sacral ala which extensive left SI joint. 3. No substantial change in chronic severe L1 burst fracture with mild bony retropulsion and severe central height loss. 4. Similar versus minimally increased 31 mm infrarenal abdominal aortic aneurysm, partially imaged. Recommend follow-up ultrasound every 3 years. This recommendation follows ACR  consensus guidelines: White Paper of the ACR Incidental Findings Committee II on Vascular Findings. J Am Coll Radiol 2013JB:6262728. 5. Aortic Atherosclerosis (ICD10-I70.0).   CT thoracic spine:   Mild superior endplate irregularity and height loss of the T1 and T2 vertebral bodies, age indeterminate. Recommend correlation with the presence or absence of point tenderness. MRI of the thoracic spine could better assess chronicity if clinically warranted.   CT head:   No acute intracranial abnormality.   Electronically Signed: By: Margaretha Sheffield M.D. On: 10/06/2022 14:51    Impression/Plan:     1.  Diagnosis  L5 left TP fracture   2.  Plan - Dr. Doreatha Martin to follow up on sacral fractures - L5 TP fracture is stable. Treating in LSO brace for comfort when ambulating - f/u outpatient in 2-3 weeks.  - No plans for neurosurgical intervention at this time  Cooper Render PA-C Neurosurgery

## 2022-10-08 NOTE — Progress Notes (Signed)
PROGRESS NOTE    Nicolas Solis  [id number] DOB: August 14, 1950 DOA: 10/06/2022 PCP: Tracie Harrier, MD    Brief Narrative:  72 year old male with history of hyperlipidemia, Parkinson's on Sinemet, former tobacco user 50+ years, history of L1 compression fracture, who presents emergency department for chief concerns of weakness and imbalance.   Of note, patient fell at home about 2 to 3 weeks ago hitting his head on the stair banister while walking up the stairs.  Patient fell and hit his head on the banister 3 weeks ago.  Since then he has been feeling weak.  He denies overt pain until Friday.  He denies numbness in his lower extremities.  At bedside he is able to move all his toes and pulses are intact.   He reports being constipated. He reports last BM was two days ago (Friday), and it was a good brown bowel movement. He denies chest pain, shortness of breath,abdominal pain, dysuria, hematuria, diarrhea.   Patient denies trauma to his person.   He drinks two shots of bourbon nightly. He reports poor PO intake otherwise Patient mated for medical evaluation and treatment of hyponatremia.  Neurosurgery contacted for emergency room given L1 vertebral fracture, left rib fracture, sacral and left sacral alla fracture.  Assessment & Plan:   Principal Problem:   Hyponatremia Active Problems:   Coronary artery disease   Parkinson's disease   Hyperlipidemia   Frequent falls   Hypomagnesemia   Alcohol abuse   Left rib fracture   L1 vertebral fracture (HCC)   Hyponatremia Patient has been on fairly aggressive IV crystalloid therapy without improvement in serum sodium.  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  Patient received 1 dose of Samsca on 3/11 with good improvement in sodium.  Sodium improved to 128.  CT chest negative for acute issues. Plan: Advised patient to increase oral intake.  Plan to recheck sodium level in AM.  If decreased will consider salt tablets.  L1  vertebral fracture (HCC) With left S1 transverse fracture Sacral alla fractures. Discussed with neurosurgery Dr. Cari Caraway.  Vertebral fracture is stable and will not require surgical intervention at this time.  No bracing recommended per neurosurgery.  Symptomatic support.  Pain control.  Neurosurgery recommends outpatient follow-up with orthopedic trauma surgeon Dr. Doreatha Martin in [place].  Will add to discharge  Left rib fracture Left anterolateral 6th-10th ribs Incentive spirometry and flutter valve ordered Lidocaine patch daily ordered Toradol 15 mg IV every 6 hours as needed for moderate pain Morphine 2 mg IV every 4 hours as needed for severe pain   Alcohol abuse Patient has been drinking at least 2 drinks of bourbon nightly despite poor p.o. intake over the last 2 weeks Counseled patient on this excessive EtOH intake in setting of poor p.o. intake otherwise Timing 100 mg p.o. daily, folic acid 1 mg daily ordered Ativan 2 mg IV every 6 hours.  For anxiety   Hypomagnesemia Monitor and replace as necessary   Frequent falls With increasing weakness and poor PO intake Presumed secondary to hyponatremia, fall precaution Optimize electrolytes PT, OT, TOC were consulted Recommendation for skilled nursing facility [facility] on consult   Parkinson's disease Carbidopa-levodopa immediate release 25-100 mg tablet, 2 tablets 3 times daily before meals (spouse request 30 minutes before each meal) Carbidopa-levodopa controlled release 25-100, 1 tablet nightly were resumed   DVT prophylaxis: SQ Lovenox Code Status: Full Family Communication: Wife at bedside 3/11, 3/12 Disposition Plan: Status is: Inpatient Remains inpatient appropriate because: Hyponatremia, unknown  timeline.  Intractable pain.  Multiple fractures.  Possible medical readiness for discharge in 24 hours.  Will need skilled nursing facility.   Level of care: Med-Surg  Consultants:  Nephrology Neurosurgery  Procedures:   None  Antimicrobials: None   Subjective: Seen and examined.  Patient more awake and alert this morning.  Resting in bed.  Answers all questions appropriately.  No visible distress.  Wife at bedside.  Objective: Vitals:   10/07/22 2340 10/08/22 0332 10/08/22 0835 10/08/22 1249  BP: 131/75 127/68 (!) 153/76 (!) 156/80  Pulse: 89 90 83 78  Resp: '18 18 18 18  '$ Temp: 98.3 F (36.8 C) 98.3 F (36.8 C) 97.7 F (36.5 C) 97.9 F (36.6 C)  TempSrc: Oral  Axillary Oral  SpO2: 94% 96% 94% 99%  Weight:      Height:        Intake/Output Summary (Last 24 hours) at 10/08/2022 1253 Last data filed at 10/08/2022 1033 Gross per 24 hour  Intake 480 ml  Output 3250 ml  Net -2770 ml   Filed Weights   10/06/22 1227  Weight: 80.9 kg    Examination:  General exam: No acute distress Respiratory system: Lungs clear.  Normal work of breathing.  Room air Cardiovascular system: S1-S2, RRR, no murmurs, no pedal edema Gastrointestinal system: Soft, NT/ND, normal bowel sounds Central nervous system: Alert, oriented x 3, no focal deficits Extremities: Decreased power bilateral lower extremities.  Gait not assessed Skin: No rashes, lesions or ulcers Psychiatry: Judgement and insight appear normal. Mood & affect flattened.     Data Reviewed: I have personally reviewed following labs and imaging studies  CBC: Recent Labs  Lab 10/06/22 1231 10/07/22 0336  WBC 6.2 5.3  NEUTROABS 4.6  --   HGB 12.7* 11.0*  HCT 35.5* 30.5*  MCV 96.7 97.1  PLT 218 0000000   Basic Metabolic Panel: Recent Labs  Lab 10/06/22 1231 10/06/22 1445 10/06/22 1717 10/07/22 0115 10/07/22 0336 10/07/22 1333 10/07/22 2037 10/08/22 0436  NA 121*  --    < > 123* 122* 123* 124* 128*  K 3.7  --   --   --  3.6 3.8  --   --   CL 88*  --   --   --  91* 91*  --   --   CO2 24  --   --   --  21* 26  --   --   GLUCOSE 125*  --   --   --  112* 114*  --   --   BUN 9  --   --   --  10 9  --   --   CREATININE 0.43*  --   --    --  0.48* 0.44*  --   --   CALCIUM 8.5*  --   --   --  7.7* 8.4*  --   --   MG  --  1.6*  --   --  1.8  --   --   --   PHOS  --  3.0  --   --   --   --   --   --    < > = values in this interval not displayed.   GFR: Estimated Creatinine Clearance: 91.6 mL/min (A) (by C-G formula based on SCr of 0.44 mg/dL (L)). Liver Function Tests: Recent Labs  Lab 10/07/22 0936 10/07/22 1333  AST 49* 49*  ALT 14 6  ALKPHOS 83 91  BILITOT 0.8  0.7  PROT 6.3* 7.9  ALBUMIN 3.2* 3.8   No results for input(s): "LIPASE", "AMYLASE" in the last 168 hours. No results for input(s): "AMMONIA" in the last 168 hours. Coagulation Profile: No results for input(s): "INR", "PROTIME" in the last 168 hours. Cardiac Enzymes: No results for input(s): "CKTOTAL", "CKMB", "CKMBINDEX", "TROPONINI" in the last 168 hours. BNP (last 3 results) No results for input(s): "PROBNP" in the last 8760 hours. HbA1C: No results for input(s): "HGBA1C" in the last 72 hours. CBG: No results for input(s): "GLUCAP" in the last 168 hours. Lipid Profile: Recent Labs    10/06/22 1552  TRIG 78   Thyroid Function Tests: No results for input(s): "TSH", "T4TOTAL", "FREET4", "T3FREE", "THYROIDAB" in the last 72 hours. Anemia Panel: No results for input(s): "VITAMINB12", "FOLATE", "FERRITIN", "TIBC", "IRON", "RETICCTPCT" in the last 72 hours. Sepsis Labs: No results for input(s): "PROCALCITON", "LATICACIDVEN" in the last 168 hours.  Recent Results (from the past 240 hour(s))  Resp panel by RT-PCR (RSV, Flu A&B, Covid) Anterior Nasal Swab     Status: None   Collection Time: 10/06/22 12:31 PM   Specimen: Anterior Nasal Swab  Result Value Ref Range Status   SARS Coronavirus 2 by RT PCR NEGATIVE NEGATIVE Final    Comment: (NOTE) SARS-CoV-2 target nucleic acids are NOT DETECTED.  The SARS-CoV-2 RNA is generally detectable in upper respiratory specimens during the acute phase of infection. The lowest concentration of SARS-CoV-2  viral copies this assay can detect is 138 copies/mL. A negative result does not preclude SARS-Cov-2 infection and should not be used as the sole basis for treatment or other patient management decisions. A negative result may occur with  improper specimen collection/handling, submission of specimen other than nasopharyngeal swab, presence of viral mutation(s) within the areas targeted by this assay, and inadequate number of viral copies(<138 copies/mL). A negative result must be combined with clinical observations, patient history, and epidemiological information. The expected result is Negative.  Fact Sheet for Patients:  EntrepreneurPulse.com.au  Fact Sheet for Healthcare Providers:  IncredibleEmployment.be  This test is no t yet approved or cleared by the [place] FDA and  has been authorized for detection and/or diagnosis of SARS-CoV-2 by FDA under an Emergency Use Authorization (EUA). This EUA will remain  in effect (meaning this test can be used) for the duration of the COVID-19 declaration under Section 564(b)(1) of the Act, 21 U.S.C.section 360bbb-3(b)(1), unless the authorization is terminated  or revoked sooner.       Influenza A by PCR NEGATIVE NEGATIVE Final   Influenza B by PCR NEGATIVE NEGATIVE Final    Comment: (NOTE) The Xpert Xpress SARS-CoV-2/FLU/RSV plus assay is intended as an aid in the diagnosis of influenza from Nasopharyngeal swab specimens and should not be used as a sole basis for treatment. Nasal washings and aspirates are unacceptable for Xpert Xpress SARS-CoV-2/FLU/RSV testing.  Fact Sheet for Patients: EntrepreneurPulse.com.au  Fact Sheet for Healthcare Providers: IncredibleEmployment.be  This test is not yet approved or cleared by the [place] FDA and has been authorized for detection and/or diagnosis of SARS-CoV-2 by FDA under an Emergency Use Authorization  (EUA). This EUA will remain in effect (meaning this test can be used) for the duration of the COVID-19 declaration under Section 564(b)(1) of the Act, 21 U.S.C. section 360bbb-3(b)(1), unless the authorization is terminated or revoked.     Resp Syncytial Virus by PCR NEGATIVE NEGATIVE Final    Comment: (NOTE) Fact Sheet for Patients: EntrepreneurPulse.com.au  Fact Sheet for Healthcare  Providers: IncredibleEmployment.be  This test is not yet approved or cleared by the [place] and has been authorized for detection and/or diagnosis of SARS-CoV-2 by FDA under an Emergency Use Authorization (EUA). This EUA will remain in effect (meaning this test can be used) for the duration of the COVID-19 declaration under Section 564(b)(1) of the Act, 21 U.S.C. section 360bbb-3(b)(1), unless the authorization is terminated or revoked.  Performed at [facility], [address] [place], [place] [address]          Radiology Studies: CT Angio Chest Pulmonary Embolism (PE) W or WO Contrast  Result Date: 10/07/2022 CLINICAL DATA:  72 year old male with weakness and pain. EXAM: CT ANGIOGRAPHY CHEST WITH CONTRAST TECHNIQUE: Multidetector CT imaging of the chest was performed using the standard protocol during bolus administration of intravenous contrast. Multiplanar CT image reconstructions and MIPs were obtained to evaluate the vascular anatomy. RADIATION DOSE REDUCTION: This exam was performed according to the departmental dose-optimization program which includes automated exposure control, adjustment of the mA and/or kV according to patient size and/or use of iterative reconstruction technique. CONTRAST:  80m OMNIPAQUE IOHEXOL 350 MG/ML SOLN COMPARISON:  Prior chest radiographs FINDINGS: Cardiovascular: This is a technically adequate study but motion artifact decreases sensitivity in the LOWER lungs. No pulmonary emboli are identified. UPPER  limits normal heart size and coronary artery calcifications noted. There is no evidence of thoracic aortic aneurysm. A trace pericardial effusion is present. Mediastinum/Nodes: No enlarged mediastinal, hilar, or axillary lymph nodes. Thyroid gland, trachea, and esophagus demonstrate no significant findings. Lungs/Pleura: A trace LEFT pleural effusion and mild bibasilar atelectasis noted. There is no evidence of airspace disease, consolidation, mass, suspicious nodule or pneumothorax. Upper Abdomen: No acute abnormality. Musculoskeletal: Acute fractures of the LEFT 6th through 8th ribs noted. More inferior ribs are not visualized on this study. Review of the MIP images confirms the above findings. IMPRESSION: 1. Acute fractures of the LEFT 6th through 8th ribs. More inferior ribs are not visualized on this study. 2. Trace LEFT pleural effusion and mild bibasilar atelectasis. 3. No evidence of pulmonary emboli or thoracic aortic aneurysm. 4. Coronary artery disease Electronically Signed   By: JMargarette CanadaM.D.   On: 10/07/2022 17:58   CT Head Wo Contrast  Addendum Date: 10/06/2022   ADDENDUM REPORT: 10/06/2022 14:57 ADDENDUM: Correction: The left transverse fracture described in the findings and impression as at S1 is actually at L5. Also, in addition to the left sacral ala fracture described in the report there also is a nondisplaced S2 vertebral body fracture. This was discussed with JSheran Luzvia telephone at 2:56 PM. Electronically Signed   By: FMargaretha SheffieldM.D.   On: 10/06/2022 14:57   Result Date: 10/06/2022 CLINICAL DATA:  Head trauma, minor (Age >= 65y); Back trauma, no prior imaging (Age >= 16y); Neck trauma (Age >= 65y) EXAM: CT HEAD WITHOUT CONTRAST CT CERVICAL SPINE WITHOUT CONTRAST CT LUMBAR SPINE WITHOUT CONTRAST TECHNIQUE: Multidetector CT imaging of the head, cervical and lumbar spine was performed following the standard protocol without intravenous contrast. Multiplanar CT image  reconstructions of the cervical spine were also generated. RADIATION DOSE REDUCTION: This exam was performed according to the departmental dose-optimization program which includes automated exposure control, adjustment of the mA and/or kV according to patient size and/or use of iterative reconstruction technique. COMPARISON:  None Available. FINDINGS: CT HEAD FINDINGS Brain: No evidence of acute infarction, hemorrhage, hydrocephalus, extra-axial collection or mass lesion/mass effect. Mild for age patchy white matter  hypodensities, compatible with chronic microvascular ischemic disease. Vascular: No hyperdense vessel identified. Skull: No acute fracture. Sinuses/Orbits: Mild paranasal sinus mucosal thickening. No acute orbital findings. CT CERVICAL SPINE FINDINGS Alignment: Mild reversal and mild likely degenerative anterolisthesis of C4 on C5. Otherwise, no substantial sagittal subluxation Skull base and vertebrae: Mild superior endplate irregularity and height loss of the T1 and T2 vertebral bodies, age indeterminate. Cervical vertebral body heights are maintained Soft tissues and spinal canal: No prevertebral fluid or swelling. No visible canal hematoma. Disc levels: Multilevel facet uncovertebral hypertrophy with varying degrees of neural foraminal stenosis. Upper chest: Visualized lung apices are clear. Other: Calcific atherosclerosis. CT LUMBAR SPINE FINDINGS Alignment: Similar alignment.  No new sagittal subluxation. Vertebrae: Chronic L1 burst fracture with mild bony retropulsion with similar severe height loss centrally. No progressive height loss. No increase in bony retropulsion. Additional vertebral body heights are similar to the prior. Acute nondisplaced fracture through the left S1 transverse process (for example see series 4, image 98). Acute nondisplaced fracture through the left sacral ala which extensive left SI joint (for example see series 4, image 111). Soft tissues and spinal canal: No  prevertebral fluid or swelling. No visible canal hematoma. Disc levels: Severe multifocal degenerative change including facet arthropathy with varying degrees of neural foraminal stenosis. Paraspinal: Similar versus minimally increased 31 mm infrarenal abdominal aortic aneurysm, partially imaged. Calcific atherosclerosis. IMPRESSION: CT lumbar spine: 1. Acute nondisplaced fracture through the left S1 transverse process. 2. Acute nondisplaced fracture through the left sacral ala which extensive left SI joint. 3. No substantial change in chronic severe L1 burst fracture with mild bony retropulsion and severe central height loss. 4. Similar versus minimally increased 31 mm infrarenal abdominal aortic aneurysm, partially imaged. Recommend follow-up ultrasound every 3 years. This recommendation follows ACR consensus guidelines: White Paper of the ACR Incidental Findings Committee II on Vascular Findings. J Am Coll Radiol 2013CJ:3944253. 5. Aortic Atherosclerosis (ICD10-I70.0). CT thoracic spine: Mild superior endplate irregularity and height loss of the T1 and T2 vertebral bodies, age indeterminate. Recommend correlation with the presence or absence of point tenderness. MRI of the thoracic spine could better assess chronicity if clinically warranted. CT head: No acute intracranial abnormality. Electronically Signed: By: Margaretha Sheffield M.D. On: 10/06/2022 14:51   CT Cervical Spine Wo Contrast  Addendum Date: 10/06/2022   ADDENDUM REPORT: 10/06/2022 14:57 ADDENDUM: Correction: The left transverse fracture described in the findings and impression as at S1 is actually at L5. Also, in addition to the left sacral ala fracture described in the report there also is a nondisplaced S2 vertebral body fracture. This was discussed with Sheran Luz via telephone at 2:56 PM. Electronically Signed   By: Margaretha Sheffield M.D.   On: 10/06/2022 14:57   Result Date: 10/06/2022 CLINICAL DATA:  Head trauma, minor (Age >= 65y);  Back trauma, no prior imaging (Age >= 16y); Neck trauma (Age >= 65y) EXAM: CT HEAD WITHOUT CONTRAST CT CERVICAL SPINE WITHOUT CONTRAST CT LUMBAR SPINE WITHOUT CONTRAST TECHNIQUE: Multidetector CT imaging of the head, cervical and lumbar spine was performed following the standard protocol without intravenous contrast. Multiplanar CT image reconstructions of the cervical spine were also generated. RADIATION DOSE REDUCTION: This exam was performed according to the departmental dose-optimization program which includes automated exposure control, adjustment of the mA and/or kV according to patient size and/or use of iterative reconstruction technique. COMPARISON:  None Available. FINDINGS: CT HEAD FINDINGS Brain: No evidence of acute infarction, hemorrhage, hydrocephalus, extra-axial collection or mass lesion/mass  effect. Mild for age patchy white matter hypodensities, compatible with chronic microvascular ischemic disease. Vascular: No hyperdense vessel identified. Skull: No acute fracture. Sinuses/Orbits: Mild paranasal sinus mucosal thickening. No acute orbital findings. CT CERVICAL SPINE FINDINGS Alignment: Mild reversal and mild likely degenerative anterolisthesis of C4 on C5. Otherwise, no substantial sagittal subluxation Skull base and vertebrae: Mild superior endplate irregularity and height loss of the T1 and T2 vertebral bodies, age indeterminate. Cervical vertebral body heights are maintained Soft tissues and spinal canal: No prevertebral fluid or swelling. No visible canal hematoma. Disc levels: Multilevel facet uncovertebral hypertrophy with varying degrees of neural foraminal stenosis. Upper chest: Visualized lung apices are clear. Other: Calcific atherosclerosis. CT LUMBAR SPINE FINDINGS Alignment: Similar alignment.  No new sagittal subluxation. Vertebrae: Chronic L1 burst fracture with mild bony retropulsion with similar severe height loss centrally. No progressive height loss. No increase in bony  retropulsion. Additional vertebral body heights are similar to the prior. Acute nondisplaced fracture through the left S1 transverse process (for example see series 4, image 98). Acute nondisplaced fracture through the left sacral ala which extensive left SI joint (for example see series 4, image 111). Soft tissues and spinal canal: No prevertebral fluid or swelling. No visible canal hematoma. Disc levels: Severe multifocal degenerative change including facet arthropathy with varying degrees of neural foraminal stenosis. Paraspinal: Similar versus minimally increased 31 mm infrarenal abdominal aortic aneurysm, partially imaged. Calcific atherosclerosis. IMPRESSION: CT lumbar spine: 1. Acute nondisplaced fracture through the left S1 transverse process. 2. Acute nondisplaced fracture through the left sacral ala which extensive left SI joint. 3. No substantial change in chronic severe L1 burst fracture with mild bony retropulsion and severe central height loss. 4. Similar versus minimally increased 31 mm infrarenal abdominal aortic aneurysm, partially imaged. Recommend follow-up ultrasound every 3 years. This recommendation follows ACR consensus guidelines: White Paper of the ACR Incidental Findings Committee II on Vascular Findings. J Am Coll Radiol 2013CJ:3944253. 5. Aortic Atherosclerosis (ICD10-I70.0). CT thoracic spine: Mild superior endplate irregularity and height loss of the T1 and T2 vertebral bodies, age indeterminate. Recommend correlation with the presence or absence of point tenderness. MRI of the thoracic spine could better assess chronicity if clinically warranted. CT head: No acute intracranial abnormality. Electronically Signed: By: Margaretha Sheffield M.D. On: 10/06/2022 14:51   CT Lumbar Spine Wo Contrast  Addendum Date: 10/06/2022   ADDENDUM REPORT: 10/06/2022 14:57 ADDENDUM: Correction: The left transverse fracture described in the findings and impression as at S1 is actually at L5. Also, in  addition to the left sacral ala fracture described in the report there also is a nondisplaced S2 vertebral body fracture. This was discussed with Sheran Luz via telephone at 2:56 PM. Electronically Signed   By: Margaretha Sheffield M.D.   On: 10/06/2022 14:57   Result Date: 10/06/2022 CLINICAL DATA:  Head trauma, minor (Age >= 65y); Back trauma, no prior imaging (Age >= 16y); Neck trauma (Age >= 65y) EXAM: CT HEAD WITHOUT CONTRAST CT CERVICAL SPINE WITHOUT CONTRAST CT LUMBAR SPINE WITHOUT CONTRAST TECHNIQUE: Multidetector CT imaging of the head, cervical and lumbar spine was performed following the standard protocol without intravenous contrast. Multiplanar CT image reconstructions of the cervical spine were also generated. RADIATION DOSE REDUCTION: This exam was performed according to the departmental dose-optimization program which includes automated exposure control, adjustment of the mA and/or kV according to patient size and/or use of iterative reconstruction technique. COMPARISON:  None Available. FINDINGS: CT HEAD FINDINGS Brain: No evidence of acute infarction,  hemorrhage, hydrocephalus, extra-axial collection or mass lesion/mass effect. Mild for age patchy white matter hypodensities, compatible with chronic microvascular ischemic disease. Vascular: No hyperdense vessel identified. Skull: No acute fracture. Sinuses/Orbits: Mild paranasal sinus mucosal thickening. No acute orbital findings. CT CERVICAL SPINE FINDINGS Alignment: Mild reversal and mild likely degenerative anterolisthesis of C4 on C5. Otherwise, no substantial sagittal subluxation Skull base and vertebrae: Mild superior endplate irregularity and height loss of the T1 and T2 vertebral bodies, age indeterminate. Cervical vertebral body heights are maintained Soft tissues and spinal canal: No prevertebral fluid or swelling. No visible canal hematoma. Disc levels: Multilevel facet uncovertebral hypertrophy with varying degrees of neural foraminal  stenosis. Upper chest: Visualized lung apices are clear. Other: Calcific atherosclerosis. CT LUMBAR SPINE FINDINGS Alignment: Similar alignment.  No new sagittal subluxation. Vertebrae: Chronic L1 burst fracture with mild bony retropulsion with similar severe height loss centrally. No progressive height loss. No increase in bony retropulsion. Additional vertebral body heights are similar to the prior. Acute nondisplaced fracture through the left S1 transverse process (for example see series 4, image 98). Acute nondisplaced fracture through the left sacral ala which extensive left SI joint (for example see series 4, image 111). Soft tissues and spinal canal: No prevertebral fluid or swelling. No visible canal hematoma. Disc levels: Severe multifocal degenerative change including facet arthropathy with varying degrees of neural foraminal stenosis. Paraspinal: Similar versus minimally increased 31 mm infrarenal abdominal aortic aneurysm, partially imaged. Calcific atherosclerosis. IMPRESSION: CT lumbar spine: 1. Acute nondisplaced fracture through the left S1 transverse process. 2. Acute nondisplaced fracture through the left sacral ala which extensive left SI joint. 3. No substantial change in chronic severe L1 burst fracture with mild bony retropulsion and severe central height loss. 4. Similar versus minimally increased 31 mm infrarenal abdominal aortic aneurysm, partially imaged. Recommend follow-up ultrasound every 3 years. This recommendation follows ACR consensus guidelines: White Paper of the ACR Incidental Findings Committee II on Vascular Findings. J Am Coll Radiol [id number]. 5. Aortic Atherosclerosis (ICD10-I70.0). CT thoracic spine: Mild superior endplate irregularity and height loss of the T1 and T2 vertebral bodies, age indeterminate. Recommend correlation with the presence or absence of point tenderness. MRI of the thoracic spine could better assess chronicity if clinically warranted. CT head: No  acute intracranial abnormality. Electronically Signed: By: Margaretha Sheffield M.D. On: 10/06/2022 14:51   DG Ribs Unilateral W/Chest Left  Result Date: 10/06/2022 CLINICAL DATA:  LEFT chest pain following fall 3 weeks ago. Initial encounter. EXAM: LEFT RIBS AND CHEST - 3+ VIEW COMPARISON:  01/13/2022 radiograph FINDINGS: This is a low volume study. The cardiomediastinal silhouette is unremarkable. There is no evidence of focal airspace disease, pulmonary edema, suspicious pulmonary nodule/mass, pleural effusion, or pneumothorax. There are fractures of the anterolateral LEFT 6th through 10th ribs. The 6th and 7th ribs may be subacute to remote. The other fractures are acute. IMPRESSION: Fractures of the anterolateral LEFT 6th through 10th ribs. No pneumothorax or pleural effusion. No acute cardiopulmonary disease. Electronically Signed   By: Margarette Canada M.D.   On: 10/06/2022 13:20        Scheduled Meds:  atorvastatin  20 mg Oral QHS   carbidopa-levodopa  2 tablet Oral TID AC   Carbidopa-Levodopa ER  1 tablet Oral QHS   cholecalciferol  2,000 Units Oral q AM   cyanocobalamin  2,500 mcg Oral QPM   dorzolamide  1 drop Both Eyes Daily   And   timolol  1 drop Both Eyes Daily  enoxaparin (LOVENOX) injection  40 mg Subcutaneous [id number]   folic acid  1 mg Oral Daily   lidocaine  1 patch Transdermal Q24H   ranolazine  500 mg Oral BID   tamsulosin  0.4 mg Oral QPC supper   Continuous Infusions:     LOS: 2 days       Sidney Ace, MD [facility]   If 7PM-7AM, please contact night-coverage  10/08/2022, 12:53 PM

## 2022-10-08 NOTE — NC FL2 (Signed)
[place] LEVEL OF CARE FORM     IDENTIFICATION  Patient Name: Nicolas Solis Birthdate: 1950/11/03 Sex: male Admission Date (Current Location): 10/06/2022  [facility] and [place] Number:  Engineering geologist and Address:  [facility], [address], [place], [place] [address]      Provider Number: [id number]  Attending Physician Name and Address:  Sidney Ace, MD  Relative Name and Phone Number:  Jaxxyn, [place]hemin (Spouse) [phone]    Current Level of Care: Hospital Recommended Level of Care: [place] Prior Approval Number:    Date Approved/Denied:   PASRR Number: [id number]  Discharge Plan: SNF    Current Diagnoses: Patient Active Problem List   Diagnosis Date Noted   Closed fracture of transverse process of lumbar vertebra ([place]) 10/08/2022   Hyponatremia 10/06/2022   Parkinson's disease 10/06/2022   Hyperlipidemia 10/06/2022   Frequent falls 10/06/2022   Hypomagnesemia 10/06/2022   Alcohol abuse 10/06/2022   Left rib fracture 10/06/2022   L1 vertebral fracture ([place]) 10/06/2022   Coronary artery disease    Macular hole, right eye 03/18/2017    Orientation RESPIRATION BLADDER Height & Weight     Self, Place  Normal External catheter Weight: 80.9 kg Height:  6' (182.9 cm)  BEHAVIORAL SYMPTOMS/MOOD NEUROLOGICAL BOWEL NUTRITION STATUS  Other (Comment) (n/a)  (n/a) Continent Diet  AMBULATORY STATUS COMMUNICATION OF NEEDS Skin   Supervision Verbally Normal                       Personal Care Assistance Level of Assistance  Dressing, Bathing Bathing Assistance: Limited assistance   Dressing Assistance: Limited assistance     Functional Limitations Info  Sight Sight Info: Impaired        SPECIAL CARE FACTORS FREQUENCY  PT (By licensed PT), OT (By licensed OT)     PT Frequency: Min 2x weekly OT Frequency: MIn 2x weekly            Contractures Contractures Info: Not  present    Additional Factors Info  Code Status, Allergies Code Status Info: FULL Allergies Info: No Known Allergies           Current Medications (10/08/2022):  This is the current hospital active medication list Current Facility-Administered Medications  Medication Dose Route Frequency Provider Last Rate Last Admin   acetaminophen (TYLENOL) tablet 650 mg  650 mg Oral Q6H PRN Cox, Amy N, DO       Or   acetaminophen (TYLENOL) suppository 650 mg  650 mg Rectal Q6H PRN Cox, Amy N, DO       atorvastatin (LIPITOR) tablet 20 mg  20 mg Oral QHS Cox, Amy N, DO   20 mg at 10/07/22 2205   carbidopa-levodopa (SINEMET IR) 25-100 MG per tablet immediate release 2 tablet  2 tablet Oral TID AC Cox, Amy N, DO   2 tablet at 10/08/22 1205   Carbidopa-Levodopa ER (SINEMET CR) 25-100 MG tablet controlled release 1 tablet  1 tablet Oral QHS Cox, Amy N, DO   1 tablet at 10/07/22 2205   cholecalciferol (VITAMIN D3) 25 MCG (1000 UNIT) tablet 2,000 Units  2,000 Units Oral q AM Cox, Amy N, DO   2,000 Units at 10/08/22 0827   cyanocobalamin (VITAMIN B12) tablet 2,500 mcg  2,500 mcg Oral QPM Cox, Amy N, DO   2,500 mcg at 10/07/22 1800   dorzolamide (TRUSOPT) 2 % ophthalmic solution 1 drop  1 drop Both Eyes  Daily Darrick Penna, RPH   1 drop at 10/08/22 [id number]   And   timolol (TIMOPTIC) 0.5 % ophthalmic solution 1 drop  1 drop Both Eyes Daily Darrick Penna, RPH   1 drop at 10/08/22 0950   enoxaparin (LOVENOX) injection 40 mg  40 mg Subcutaneous Q24H Cox, Amy N, DO   40 mg at [id number]   folic acid (FOLVITE) tablet 1 mg  1 mg Oral Daily Cox, Amy N, DO   1 mg at 10/08/22 0949   ketorolac (TORADOL) 15 MG/ML injection 15 mg  15 mg Intravenous Q6H PRN Ralene Muskrat B, MD   15 mg at 10/08/22 1040   lidocaine (LIDODERM) 5 % 1 patch  1 patch Transdermal Q24H Cox, Amy N, DO   1 patch at 10/08/22 1205   LORazepam (ATIVAN) injection 2 mg  2 mg Intravenous Q6H PRN Cox, Amy N, DO   2 mg at 10/07/22 0050   morphine  (PF) 2 MG/ML injection 2 mg  2 mg Intravenous Q4H PRN Ralene Muskrat B, MD   2 mg at 10/08/22 0553   nitroGLYCERIN (NITROSTAT) SL tablet 0.4 mg  0.4 mg Sublingual Q5 min PRN Cox, Amy N, DO   0.4 mg at 10/07/22 1421   ondansetron (ZOFRAN) tablet 4 mg  4 mg Oral Q6H PRN Cox, Amy N, DO       Or   ondansetron (ZOFRAN) injection 4 mg  4 mg Intravenous Q6H PRN Cox, Amy N, DO       ranolazine (RANEXA) 12 hr tablet 500 mg  500 mg Oral BID Cox, Amy N, DO   500 mg at 10/08/22 0949   senna-docusate (Senokot-S) tablet 1 tablet  1 tablet Oral QHS PRN Cox, Amy N, DO       tamsulosin (FLOMAX) capsule 0.4 mg  0.4 mg Oral QPC supper Cox, Amy N, DO   0.4 mg at 10/07/22 1800     Discharge Medications: Please see discharge summary for a list of discharge medications.  Relevant Imaging Results:  Relevant Lab Results:   Additional Information SS# [id number]  Laurena Slimmer, RN

## 2022-10-09 DIAGNOSIS — E871 Hypo-osmolality and hyponatremia: Secondary | ICD-10-CM | POA: Diagnosis not present

## 2022-10-09 LAB — BASIC METABOLIC PANEL WITH GFR
Anion gap: 7 (ref 5–15)
BUN: 12 mg/dL (ref 8–23)
CO2: 25 mmol/L (ref 22–32)
Calcium: 8.6 mg/dL — ABNORMAL LOW (ref 8.9–10.3)
Chloride: 95 mmol/L — ABNORMAL LOW (ref 98–111)
Creatinine, Ser: 0.57 mg/dL — ABNORMAL LOW (ref 0.61–1.24)
GFR, Estimated: >60 mL/min (ref >60)
Glucose, Bld: 134 mg/dL — ABNORMAL HIGH (ref 70–99)
Potassium: 3.8 mmol/L (ref 3.5–5.1)
Sodium: 127 mmol/L — ABNORMAL LOW (ref 135–145)

## 2022-10-09 MED ORDER — TRAMADOL HCL 50 MG PO TABS
50.0000 mg | ORAL_TABLET | Freq: Four times a day (QID) | ORAL | Status: DC | PRN
  Administered 2022-10-09 – 2022-10-11 (×5): 50 mg via ORAL
  Filled 2022-10-09 (×6): qty 1

## 2022-10-09 NOTE — Progress Notes (Signed)
Physical Therapy Treatment Patient Details Name: Nicolas Solis MRN: [id number] DOB: 1950/11/19 Today's Date: 10/09/2022   History of Present Illness Pt is a 72 year old male with PMH that includes hyperlipidemia, Parkinson's disease, former tobacco user 50+ years, and L1 compression fracture who presents emergency department for chief concerns of weakness and imbalance.  Of note the patient fell at home about 2 to 3 weeks prior to admission hitting his head on the stair banister while walking up the stairs and since then he has been feeling weak.  MD assessment includes: hyponatremia, L1 vertebral fracture, L rib fractures, alcohol abuse, hypomagnesemia, and falls.    PT Comments    Pt was pleasant and motivated to participate during the session and put forth good effort throughout but remained limited by pain with movement.  Pt required physical assistance with log roll training and sit to/from stand transfers and was only able to ambulate 5 feet near the EOB and to the chair before needing to return to sitting for pain relief.  Pt remains at an elevated risk for falls and further functional decline and will benefit from PT services in a SNF setting upon discharge to safely address deficits listed in patient problem list for decreased caregiver assistance and eventual return to PLOF.     Recommendations for follow up therapy are one component of a multi-disciplinary discharge planning process, led by the attending physician.  Recommendations may be updated based on patient status, additional functional criteria and insurance authorization.  Follow Up Recommendations  Skilled nursing-short term rehab (<3 hours/day) Can patient physically be transported by private vehicle: No   Assistance Recommended at Discharge Frequent or constant Supervision/Assistance  Patient can return home with the following A lot of help with walking and/or transfers;A lot of help with  bathing/dressing/bathroom;Assistance with cooking/housework;Assist for transportation;Help with stairs or ramp for entrance   Equipment Recommendations  Other (comment) (TBD)    Recommendations for Other Services       Precautions / Restrictions Precautions Precautions: Fall Restrictions Weight Bearing Restrictions: Yes RLE Weight Bearing: Weight bearing as tolerated LLE Weight Bearing: Weight bearing as tolerated Other Position/Activity Restrictions: TLSO for comfort with ambulation     Mobility  Bed Mobility Overal bed mobility: Needs Assistance   Rolling: Min assist Sidelying to sit: Mod assist       General bed mobility comments: Log roll training with mod A for BLE and trunk control and mod multi-modal cues for sequencing    Transfers Overall transfer level: Needs assistance Equipment used: Rolling walker (2 wheels) Transfers: Sit to/from Stand Sit to Stand: Mod assist, From elevated surface           General transfer comment: Mod verbal cues for hand placement with mod A to come to full upright standing and min A to control descent during stand to sit    Ambulation/Gait Ambulation/Gait assistance: Min guard Gait Distance (Feet): 5 Feet Assistive device: Rolling walker (2 wheels) Gait Pattern/deviations: Step-to pattern, Decreased step length - right, Decreased step length - left, Shuffle Gait velocity: decreased     General Gait Details: Equal pain reported to BLE's with mod verbal cues for upright posture and amb closer to the [employer] Mobility    Modified Rankin (Stroke Patients Only)       Balance Overall balance assessment: Needs assistance, History of Falls Sitting-balance support: Bilateral upper extremity supported, Feet supported Sitting balance-Leahy Scale:  Good     Standing balance support: Bilateral upper extremity supported, During functional activity, Reliant on assistive device for  balance Standing balance-Leahy Scale: Fair                              Cognition Arousal/Alertness: Awake/alert Behavior During Therapy: WFL for tasks assessed/performed Overall Cognitive Status: Within Functional Limits for tasks assessed                                          Exercises Total Joint Exercises Ankle Circles/Pumps: AROM, Strengthening, Both, 10 reps Quad Sets: Strengthening, Both, 10 reps Gluteal Sets: Strengthening, Both, 10 reps Long Arc Quad: Strengthening, Both, 10 reps Other Exercises Other Exercises: log roll training    General Comments        Pertinent Vitals/Pain Pain Assessment Pain Assessment: 0-10 Pain Score: 5  Pain Location: back Pain Intervention(s): Repositioned, Premedicated before session, Monitored during session    Home Living                          Prior Function            PT Goals (current goals can now be found in the care plan section) Progress towards PT goals: PT to reassess next treatment    Frequency    7X/week      PT Plan Current plan remains appropriate    Co-evaluation              AM-PAC PT "6 Clicks" Mobility   Outcome Measure  Help needed turning from your back to your side while in a flat bed without using bedrails?: A Lot Help needed moving from lying on your back to sitting on the side of a flat bed without using bedrails?: A Lot Help needed moving to and from a bed to a chair (including a wheelchair)?: A Lot Help needed standing up from a chair using your arms (e.g., wheelchair or bedside chair)?: A Lot Help needed to walk in hospital room?: A Lot Help needed climbing 3-5 steps with a railing? : Total 6 Click Score: 11    End of Session Equipment Utilized During Treatment: Gait belt;Back brace Activity Tolerance: Patient limited by pain Patient left: in chair;with call bell/phone within reach;with chair alarm set;with family/visitor present Nurse  Communication: Mobility status PT Visit Diagnosis: History of falling (Z91.81);Muscle weakness (generalized) (M62.81);Difficulty in walking, not elsewhere classified (R26.2);Pain Pain - Right/Left: Right Pain - part of body: Hip     Time: [id number] PT Time Calculation (min) (ACUTE ONLY): 25 min  Charges:  $Therapeutic Exercise: 8-22 mins $Therapeutic Activity: 8-22 mins                    D. Scott Ina Poupard PT, DPT 10/09/22, 1:59 PM

## 2022-10-09 NOTE — Care Management Important Message (Signed)
Important Message  Patient Details  Name: Nicolas Solis MRN: [id number] Date of Birth: 07/02/1951   Medicare Important Message Given:  Yes     Dannette Barbara 10/09/2022, 11:32 AM

## 2022-10-09 NOTE — Progress Notes (Signed)
Progress Note   Patient: Nicolas Solis [id number] DOB: 11-Apr-1951 DOA: 10/06/2022     3 DOS: the patient was seen and examined on 10/09/2022   Brief hospital course: Mr. Mandy Englin 72 year old male with history of hyperlipidemia, Parkinson's on Sinemet, former tobacco user 50+ years, history of L1 compression fracture, who presents emergency department for chief concerns of weakness and imbalance.  Of note, patient fell at home about 2 to 3 weeks ago hitting his head on the stair banister while walking up the stairs.  Vitals in the ED showed temperature of 98.2, respiration rate of 19, heart rate 83, blood pressure 149/85, SpO2 98% on room air.  Serum sodium is 121, potassium 3.7, chloride 88, bicarb 26, BUN of 9, serum creatinine 0.43, EGFR greater than 60, nonfasting blood glucose 125, WBC 6.2, hemoglobin 12.7, platelets of 218.  High sensitive troponin is 15 and on repeat was 15.  UA was negative for leukocytes and nitrates.  CT of the head without contrast: Was read as no acute intracranial abnormality.  CT lumbar spine: Acute nondisplaced fracture of the left S1 transverse process.  Acute nondisplaced fracture of left sacral ala which extensive left SI joint.  No substantial change to chronic severe L1 burst fracture with mild bony retropulsion and severe central height loss.  CT thoracic: Mild superior endplate irregularity and height loss in T1 and T2 vertebral bodies.  Age indeterminate.  ED treatment: Lidocaine patch one-time dose, acetaminophen 1 g IV one-time dose, sodium chloride 150 mL bolus one-time dose given.  And morphine 2 mg IV was ordered and family declined.  Assessment and Plan: Hyponatremia Patient has been on fairly aggressive IV crystalloid therapy without improvement in serum sodium.   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  Patient received 1 dose of Samsca on 3/11 with good improvement in sodium.  Sodium improved to 128.  CT chest negative for  acute issues. Appreciate nephrology input.  Will start patient on sodium tablets if hyponatremia persists. Encourage oral intake.    L1 vertebral fracture (HCC) With left S1 transverse fracture Sacral alla fractures. Discussed with neurosurgery Dr. Cari Caraway.  Vertebral fracture is stable and will not require surgical intervention at this time. LSO  brace  recommended per neurosurgery for comfort while ambulating.  Symptomatic support.  Pain control.   Neurosurgery recommends outpatient follow-up with orthopedic trauma surgeon Dr. Doreatha Martin in [place].  Will add to discharge    Left rib fracture Left anterolateral 6th-10th ribs Incentive spirometry and flutter valve ordered Lidocaine patch daily ordered Toradol 15 mg IV every 6 hours as needed for moderate pain Discussed with wife who would rather have a different pain medication other than morphine Will order tramadol 50 mg every 6 as needed    Alcohol abuse Patient has been drinking at least 2 drinks of bourbon nightly despite poor p.o. intake over the last 2 weeks Counseled patient on this excessive EtOH intake in setting of poor p.o. intake otherwise Thiamine 100 mg p.o. daily, folic acid 1 mg daily ordered Ativan 2 mg IV every 6 hours.  For anxiety    Hypomagnesemia Monitor and replace as necessary   Frequent falls With increasing weakness and poor PO intake Presumed secondary to hyponatremia, fall precaution Optimize electrolytes PT, OT, TOC were consulted Recommendation for skilled nursing facility [facility] on consult   Parkinson's disease Carbidopa-levodopa immediate release 25-100 mg tablet, 2 tablets 3 times daily before meals (spouse request 30 minutes before each meal) Carbidopa-levodopa controlled release 25-100,  1 tablet nightly were resumed          Subjective: Patient is seen and examined at the bedside.  Wife is also at the bedside.  Patient is sitting in chair and complains of low back pain  Physical  Exam: Vitals:   10/08/22 2352 10/09/22 0353 10/09/22 0852 10/09/22 1140  BP: 132/76 139/81 129/73 119/69  Pulse: 84 87 80 71  Resp: '19 18 16 18  '$ Temp: 98.2 F (36.8 C) 98.9 F (37.2 C) 98 F (36.7 C) 97.9 F (36.6 C)  TempSrc: Oral Oral    SpO2: 94% 94% 96% 96%  Weight:      Height:       General exam: No acute distress Respiratory system: Lungs clear.  Normal work of breathing.  Room air Cardiovascular system: S1-S2, RRR, no murmurs, no pedal edema Gastrointestinal system: Soft, NT/ND, normal bowel sounds Central nervous system: Alert, oriented x 3, no focal deficits Extremities: Decreased power bilateral lower extremities.  Gait not assessed Skin: No rashes, lesions or ulcers Psychiatry: Judgement and insight appear normal. Mood & affect flattened.  Data Reviewed: Labs reviewed.  Repeat sodium 127 compared to 128 from a day prior There are no new results to review at this time.  Family Communication: Greater than 50% of time was spent discussing patient's condition and plan of care with his wife at the bedside.  All questions and concerns have been addressed.  She verbalizes understanding and agrees with the plan.  Disposition: Status is: Inpatien Remains inpatient appropriate because: Patient is being treated for symptomatic hyponatremia  Planned Discharge Destination: Skilled nursing facility    Time spent: 35 minutes  Author: Collier Bullock, MD 10/09/2022 2:22 PM  For on call review [email or web address].

## 2022-10-09 NOTE — TOC Progression Note (Signed)
Transition of Care [facility]) - Progression Note    Patient Details  Name: Nicolas Solis MRN: [id number] Date of Birth: 06-15-1951  [facility] CM/SW Contact  Laurena Slimmer, RN Phone Number: 10/09/2022, 3:36 PM  Clinical Narrative:    Spoke with patient and his spouse to give bed offers for  [facility], [facility], [facility] and [facility] . Wife would like to think about bed offers and will make a decision tomorrow. She was advised discharge dates are determined  by MD when patient is medically stable.        Expected Discharge Plan and Services                                               Social Determinants of Health (SDOH) Interventions SDOH Screenings   Food Insecurity: No Food Insecurity (10/06/2022)  Housing: Low Risk  (10/06/2022)  Transportation Needs: No Transportation Needs (10/06/2022)  Utilities: Not At Risk (10/06/2022)  Tobacco Use: Medium Risk (10/07/2022)    Readmission Risk Interventions     No data to display

## 2022-10-09 NOTE — Progress Notes (Signed)
[facility]  ROUNDING NOTE   Subjective:   Patient seen sitting up in chair, wife at bedside Alert and oriented Awaiting breakfast tray Patient has no complaints to offer today States he feels mildly stronger and more alert  Sodium 127 Urine output 1.55 L  Objective:  Vital signs in last 24 hours:  Temp:  [97.8 F (36.6 C)-98.9 F (37.2 C)] 97.9 F (36.6 C) (03/13 1140) Pulse Rate:  [71-87] 71 (03/13 1140) Resp:  [16-19] 18 (03/13 1140) BP: (119-156)/(69-81) 119/69 (03/13 1140) SpO2:  [94 %-100 %] 96 % (03/13 1140)  Weight change:  Filed Weights   10/06/22 1227  Weight: 80.9 kg    Intake/Output: I/O last 3 completed shifts: In: 480 [P.O.:480] Out: 2600 [Urine:2600]   Intake/Output this shift:  Total I/O In: 110 [P.O.:110] Out: 500 [Urine:500]  Physical Exam: General: NAD  Head: Normocephalic, atraumatic. Moist oral mucosal membranes  Eyes: Anicteric  Lungs:  Clear to auscultation, normal effort, room air  Heart: Regular rate and rhythm  Abdomen:  Soft, nontender  Extremities: No peripheral edema.  Neurologic: Nonfocal, moving all four extremities  Skin: No lesions  Access: None    Basic Metabolic Panel: Recent Labs  Lab 10/06/22 1231 10/06/22 1445 10/06/22 1717 10/07/22 0336 10/07/22 1333 10/07/22 2037 10/08/22 0436 10/09/22 0936  NA 121*  --    < > 122* 123* 124* 128* 127*  K 3.7  --   --  3.6 3.8  --   --  3.8  CL 88*  --   --  91* 91*  --   --  95*  CO2 24  --   --  21* 26  --   --  25  GLUCOSE 125*  --   --  112* 114*  --   --  134*  BUN 9  --   --  10 9  --   --  12  CREATININE 0.43*  --   --  0.48* 0.44*  --   --  0.57*  CALCIUM 8.5*  --   --  7.7* 8.4*  --   --  8.6*  MG  --  1.6*  --  1.8  --   --   --   --   PHOS  --  3.0  --   --   --   --   --   --    < > = values in this interval not displayed.     Liver Function Tests: Recent Labs  Lab 10/07/22 0936 10/07/22 1333  AST 49* 49*  ALT 14 6  ALKPHOS 83 91   BILITOT 0.8 0.7  PROT 6.3* 7.9  ALBUMIN 3.2* 3.8    No results for input(s): "LIPASE", "AMYLASE" in the last 168 hours. No results for input(s): "AMMONIA" in the last 168 hours.  CBC: Recent Labs  Lab 10/06/22 1231 10/07/22 0336  WBC 6.2 5.3  NEUTROABS 4.6  --   HGB 12.7* 11.0*  HCT 35.5* 30.5*  MCV 96.7 97.1  PLT 218 193     Cardiac Enzymes: No results for input(s): "CKTOTAL", "CKMB", "CKMBINDEX", "TROPONINI" in the last 168 hours.  BNP: Invalid input(s): "POCBNP"  CBG: No results for input(s): "GLUCAP" in the last 168 hours.  Microbiology: Results for orders placed or performed during the hospital encounter of 10/06/22  Resp panel by RT-PCR (RSV, Flu A&B, Covid) Anterior Nasal Swab     Status: None   Collection Time: 10/06/22 12:31 PM  Specimen: Anterior Nasal Swab  Result Value Ref Range Status   SARS Coronavirus 2 by RT PCR NEGATIVE NEGATIVE Final    Comment: (NOTE) SARS-CoV-2 target nucleic acids are NOT DETECTED.  The SARS-CoV-2 RNA is generally detectable in upper respiratory specimens during the acute phase of infection. The lowest concentration of SARS-CoV-2 viral copies this assay can detect is 138 copies/mL. A negative result does not preclude SARS-Cov-2 infection and should not be used as the sole basis for treatment or other patient management decisions. A negative result may occur with  improper specimen collection/handling, submission of specimen other than nasopharyngeal swab, presence of viral mutation(s) within the areas targeted by this assay, and inadequate number of viral copies(<138 copies/mL). A negative result must be combined with clinical observations, patient history, and epidemiological information. The expected result is Negative.  Fact Sheet for Patients:  EntrepreneurPulse.com.au  Fact Sheet for Healthcare Providers:  IncredibleEmployment.be  This test is no t yet approved or cleared by  the [place] FDA and  has been authorized for detection and/or diagnosis of SARS-CoV-2 by FDA under an Emergency Use Authorization (EUA). This EUA will remain  in effect (meaning this test can be used) for the duration of the COVID-19 declaration under Section 564(b)(1) of the Act, 21 U.S.C.section 360bbb-3(b)(1), unless the authorization is terminated  or revoked sooner.       Influenza A by PCR NEGATIVE NEGATIVE Final   Influenza B by PCR NEGATIVE NEGATIVE Final    Comment: (NOTE) The Xpert Xpress SARS-CoV-2/FLU/RSV plus assay is intended as an aid in the diagnosis of influenza from Nasopharyngeal swab specimens and should not be used as a sole basis for treatment. Nasal washings and aspirates are unacceptable for Xpert Xpress SARS-CoV-2/FLU/RSV testing.  Fact Sheet for Patients: EntrepreneurPulse.com.au  Fact Sheet for Healthcare Providers: IncredibleEmployment.be  This test is not yet approved or cleared by the [place] FDA and has been authorized for detection and/or diagnosis of SARS-CoV-2 by FDA under an Emergency Use Authorization (EUA). This EUA will remain in effect (meaning this test can be used) for the duration of the COVID-19 declaration under Section 564(b)(1) of the Act, 21 U.S.C. section 360bbb-3(b)(1), unless the authorization is terminated or revoked.     Resp Syncytial Virus by PCR NEGATIVE NEGATIVE Final    Comment: (NOTE) Fact Sheet for Patients: EntrepreneurPulse.com.au  Fact Sheet for Healthcare Providers: IncredibleEmployment.be  This test is not yet approved or cleared by the [place] FDA and has been authorized for detection and/or diagnosis of SARS-CoV-2 by FDA under an Emergency Use Authorization (EUA). This EUA will remain in effect (meaning this test can be used) for the duration of the COVID-19 declaration under Section 564(b)(1) of the Act, 21  U.S.C. section 360bbb-3(b)(1), unless the authorization is terminated or revoked.  Performed at [facility], [address] [place], [place] [address]     Coagulation Studies: No results for input(s): "LABPROT", "INR" in the last 72 hours.  Urinalysis: Recent Labs    10/06/22 1338  COLORURINE YELLOW*  LABSPEC 1.011  PHURINE 7.0  GLUCOSEU NEGATIVE  HGBUR NEGATIVE  BILIRUBINUR NEGATIVE  KETONESUR 5*  PROTEINUR NEGATIVE  NITRITE NEGATIVE  LEUKOCYTESUR NEGATIVE       Imaging: CT Angio Chest Pulmonary Embolism (PE) W or WO Contrast  Result Date: 10/07/2022 CLINICAL DATA:  72 year old male with weakness and pain. EXAM: CT ANGIOGRAPHY CHEST WITH CONTRAST TECHNIQUE: Multidetector CT imaging of the chest was performed using the standard protocol during bolus administration of  intravenous contrast. Multiplanar CT image reconstructions and MIPs were obtained to evaluate the vascular anatomy. RADIATION DOSE REDUCTION: This exam was performed according to the departmental dose-optimization program which includes automated exposure control, adjustment of the mA and/or kV according to patient size and/or use of iterative reconstruction technique. CONTRAST:  10m OMNIPAQUE IOHEXOL 350 MG/ML SOLN COMPARISON:  Prior chest radiographs FINDINGS: Cardiovascular: This is a technically adequate study but motion artifact decreases sensitivity in the LOWER lungs. No pulmonary emboli are identified. UPPER limits normal heart size and coronary artery calcifications noted. There is no evidence of thoracic aortic aneurysm. A trace pericardial effusion is present. Mediastinum/Nodes: No enlarged mediastinal, hilar, or axillary lymph nodes. Thyroid gland, trachea, and esophagus demonstrate no significant findings. Lungs/Pleura: A trace LEFT pleural effusion and mild bibasilar atelectasis noted. There is no evidence of airspace disease, consolidation, mass, suspicious nodule or pneumothorax. Upper  Abdomen: No acute abnormality. Musculoskeletal: Acute fractures of the LEFT 6th through 8th ribs noted. More inferior ribs are not visualized on this study. Review of the MIP images confirms the above findings. IMPRESSION: 1. Acute fractures of the LEFT 6th through 8th ribs. More inferior ribs are not visualized on this study. 2. Trace LEFT pleural effusion and mild bibasilar atelectasis. 3. No evidence of pulmonary emboli or thoracic aortic aneurysm. 4. Coronary artery disease Electronically Signed   By: JMargarette CanadaM.D.   On: 10/07/2022 17:58     Medications:     atorvastatin  20 mg Oral QHS   carbidopa-levodopa  2 tablet Oral TID AC   Carbidopa-Levodopa ER  1 tablet Oral QHS   cholecalciferol  2,000 Units Oral q AM   cyanocobalamin  2,500 mcg Oral QPM   dorzolamide  1 drop Both Eyes Daily   And   timolol  1 drop Both Eyes Daily   enoxaparin (LOVENOX) injection  40 mg Subcutaneous [id number]  folic acid  1 mg Oral Daily   lidocaine  1 patch Transdermal Q24H   ranolazine  500 mg Oral BID   tamsulosin  0.4 mg Oral QPC supper   acetaminophen **OR** acetaminophen, ketorolac, nitroGLYCERIN, ondansetron **OR** ondansetron (ZOFRAN) IV, senna-docusate  Assessment/ Plan:  Mr. Nicolas SHUCKis a 72y.o.  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 Patient has received normal saline infusion without adequate response. Sodium 121 on admission. Tolvaptan 15 mg on 10/07/2022 Sodium 127 today, urine output just over 1 L. Wife states oral intake has  improved slightly Will defer salt tabs for now and patient is encouraged to increase oral intake. May consider salt tabs tomorrow if sodium has not improved.     LOS: 3 Kimala Horne 3/13/202412:37 PM

## 2022-10-10 DIAGNOSIS — E871 Hypo-osmolality and hyponatremia: Secondary | ICD-10-CM | POA: Diagnosis not present

## 2022-10-10 LAB — BASIC METABOLIC PANEL
Anion gap: 10 (ref 5–15)
BUN: 13 mg/dL (ref 8–23)
CO2: 23 mmol/L (ref 22–32)
Calcium: 8.6 mg/dL — ABNORMAL LOW (ref 8.9–10.3)
Chloride: 93 mmol/L — ABNORMAL LOW (ref 98–111)
Creatinine, Ser: 0.55 mg/dL — ABNORMAL LOW (ref 0.61–1.24)
GFR, Estimated: >60 mL/min (ref >60)
Glucose, Bld: 102 mg/dL — ABNORMAL HIGH (ref 70–99)
Potassium: 3.9 mmol/L (ref 3.5–5.1)
Sodium: 126 mmol/L — ABNORMAL LOW (ref 135–145)

## 2022-10-10 MED ORDER — SODIUM CHLORIDE 1 G PO TABS: 1.0000 g | ORAL_TABLET | Freq: Three times a day (TID) | ORAL | Status: DC

## 2022-10-10 MED ORDER — SODIUM CHLORIDE 1 G PO TABS
1.0000 g | ORAL_TABLET | Freq: Two times a day (BID) | ORAL | Status: DC
  Administered 2022-10-10 – 2022-10-11 (×2): 1 g via ORAL
  Filled 2022-10-10 (×2): qty 1

## 2022-10-10 NOTE — Progress Notes (Signed)
[facility]  ROUNDING NOTE   Subjective:   Patient seen laying in bed Wife at bedside Appetite remains poor Denies pain or discomfort.  No lower extremity edema  Sodium 126 Urine output 1 L  Objective:  Vital signs in last 24 hours:  Temp:  [97.4 F (36.3 C)-98.4 F (36.9 C)] 97.4 F (36.3 C) (03/14 0851) Pulse Rate:  [72-75] 72 (03/14 0851) Resp:  [14-18] 15 (03/14 0851) BP: (121-148)/(65-80) 148/80 (03/14 0851) SpO2:  [94 %-99 %] 98 % (03/14 0851)  Weight change:  Filed Weights   10/06/22 1227  Weight: 80.9 kg    Intake/Output: I/O last 3 completed shifts: In: 230 [P.O.:230] Out: 1000 [Urine:1000]   Intake/Output this shift:  Total I/O In: 360 [P.O.:360] Out: -   Physical Exam: General: NAD  Head: Normocephalic, atraumatic. Moist oral mucosal membranes  Eyes: Anicteric  Lungs:  Clear to auscultation, normal effort, room air  Heart: Regular rate and rhythm  Abdomen:  Soft, nontender  Extremities: No peripheral edema.  Neurologic: Nonfocal, moving all four extremities  Skin: No lesions  Access: None    Basic Metabolic Panel: Recent Labs  Lab 10/06/22 1231 10/06/22 1445 10/06/22 1717 10/07/22 0336 10/07/22 1333 10/07/22 2037 10/08/22 0436 10/09/22 0936 10/10/22 0912  NA 121*  --    < > 122* 123* 124* 128* 127* 126*  K 3.7  --   --  3.6 3.8  --   --  3.8 3.9  CL 88*  --   --  91* 91*  --   --  95* 93*  CO2 24  --   --  21* 26  --   --  25 23  GLUCOSE 125*  --   --  112* 114*  --   --  134* 102*  BUN 9  --   --  10 9  --   --  12 13  CREATININE 0.43*  --   --  0.48* 0.44*  --   --  0.57* 0.55*  CALCIUM 8.5*  --   --  7.7* 8.4*  --   --  8.6* 8.6*  MG  --  1.6*  --  1.8  --   --   --   --   --   PHOS  --  3.0  --   --   --   --   --   --   --    < > = values in this interval not displayed.     Liver Function Tests: Recent Labs  Lab 10/07/22 0936 10/07/22 1333  AST 49* 49*  ALT 14 6  ALKPHOS 83 91  BILITOT 0.8 0.7  PROT  6.3* 7.9  ALBUMIN 3.2* 3.8    No results for input(s): "LIPASE", "AMYLASE" in the last 168 hours. No results for input(s): "AMMONIA" in the last 168 hours.  CBC: Recent Labs  Lab 10/06/22 1231 10/07/22 0336  WBC 6.2 5.3  NEUTROABS 4.6  --   HGB 12.7* 11.0*  HCT 35.5* 30.5*  MCV 96.7 97.1  PLT 218 193     Cardiac Enzymes: No results for input(s): "CKTOTAL", "CKMB", "CKMBINDEX", "TROPONINI" in the last 168 hours.  BNP: Invalid input(s): "POCBNP"  CBG: No results for input(s): "GLUCAP" in the last 168 hours.  Microbiology: Results for orders placed or performed during the hospital encounter of 10/06/22  Resp panel by RT-PCR (RSV, Flu A&B, Covid) Anterior Nasal Swab     Status: None   Collection  Time: 10/06/22 12:31 PM   Specimen: Anterior Nasal Swab  Result Value Ref Range Status   SARS Coronavirus 2 by RT PCR NEGATIVE NEGATIVE Final    Comment: (NOTE) SARS-CoV-2 target nucleic acids are NOT DETECTED.  The SARS-CoV-2 RNA is generally detectable in upper respiratory specimens during the acute phase of infection. The lowest concentration of SARS-CoV-2 viral copies this assay can detect is 138 copies/mL. A negative result does not preclude SARS-Cov-2 infection and should not be used as the sole basis for treatment or other patient management decisions. A negative result may occur with  improper specimen collection/handling, submission of specimen other than nasopharyngeal swab, presence of viral mutation(s) within the areas targeted by this assay, and inadequate number of viral copies(<138 copies/mL). A negative result must be combined with clinical observations, patient history, and epidemiological information. The expected result is Negative.  Fact Sheet for Patients:  EntrepreneurPulse.com.au  Fact Sheet for Healthcare Providers:  IncredibleEmployment.be  This test is no t yet approved or cleared by the [place] FDA and   has been authorized for detection and/or diagnosis of SARS-CoV-2 by FDA under an Emergency Use Authorization (EUA). This EUA will remain  in effect (meaning this test can be used) for the duration of the COVID-19 declaration under Section 564(b)(1) of the Act, 21 U.S.C.section 360bbb-3(b)(1), unless the authorization is terminated  or revoked sooner.       Influenza A by PCR NEGATIVE NEGATIVE Final   Influenza B by PCR NEGATIVE NEGATIVE Final    Comment: (NOTE) The Xpert Xpress SARS-CoV-2/FLU/RSV plus assay is intended as an aid in the diagnosis of influenza from Nasopharyngeal swab specimens and should not be used as a sole basis for treatment. Nasal washings and aspirates are unacceptable for Xpert Xpress SARS-CoV-2/FLU/RSV testing.  Fact Sheet for Patients: EntrepreneurPulse.com.au  Fact Sheet for Healthcare Providers: IncredibleEmployment.be  This test is not yet approved or cleared by the [place] FDA and has been authorized for detection and/or diagnosis of SARS-CoV-2 by FDA under an Emergency Use Authorization (EUA). This EUA will remain in effect (meaning this test can be used) for the duration of the COVID-19 declaration under Section 564(b)(1) of the Act, 21 U.S.C. section 360bbb-3(b)(1), unless the authorization is terminated or revoked.     Resp Syncytial Virus by PCR NEGATIVE NEGATIVE Final    Comment: (NOTE) Fact Sheet for Patients: EntrepreneurPulse.com.au  Fact Sheet for Healthcare Providers: IncredibleEmployment.be  This test is not yet approved or cleared by the [place] FDA and has been authorized for detection and/or diagnosis of SARS-CoV-2 by FDA under an Emergency Use Authorization (EUA). This EUA will remain in effect (meaning this test can be used) for the duration of the COVID-19 declaration under Section 564(b)(1) of the Act, 21 U.S.C. section 360bbb-3(b)(1),  unless the authorization is terminated or revoked.  Performed at [facility], [address] [place], [place] [address]     Coagulation Studies: No results for input(s): "LABPROT", "INR" in the last 72 hours.  Urinalysis: No results for input(s): "COLORURINE", "LABSPEC", "PHURINE", "GLUCOSEU", "HGBUR", "BILIRUBINUR", "KETONESUR", "PROTEINUR", "UROBILINOGEN", "NITRITE", "LEUKOCYTESUR" in the last 72 hours.  Invalid input(s): "APPERANCEUR"     Imaging: No results found.   Medications:     atorvastatin  20 mg Oral QHS   carbidopa-levodopa  2 tablet Oral TID AC   Carbidopa-Levodopa ER  1 tablet Oral QHS   cholecalciferol  2,000 Units Oral q AM   cyanocobalamin  2,500 mcg Oral QPM   dorzolamide  1 drop Both Eyes Daily   And   timolol  1 drop Both Eyes Daily   enoxaparin (LOVENOX) injection  40 mg Subcutaneous Q24H   lidocaine  1 patch Transdermal Q24H   ranolazine  500 mg Oral BID   sodium chloride  1 g Oral BID WC   tamsulosin  0.4 mg Oral QPC supper   acetaminophen **OR** acetaminophen, nitroGLYCERIN, ondansetron **OR** ondansetron (ZOFRAN) IV, senna-docusate, traMADol  Assessment/ Plan:  Mr. Nicolas Solis is a 72 y.o.  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 Patient has received normal saline infusion without adequate response. Sodium 121 on admission. Tolvaptan 15 mg on 10/07/2022 Sodium 126 Will order salt tabs 1g BID      LOS: 4 Ramanda Paules 3/14/202412:43 PM

## 2022-10-10 NOTE — TOC Progression Note (Signed)
Transition of Care [facility]) - Progression Note    Patient Details  Name: Nicolas Solis MRN: [id number] Date of Birth: [birth year]  Transition of Care [facility]) CM/SW [place], RN Phone Number: 10/10/2022, 9:55 AM  Clinical Narrative:    Met with the patient and his wife in the room, they have researched the facilities and want to go to [facility], I notified Tonya at [facility],         Expected Discharge Plan and Services                                               Social Determinants of Health ([place]) Interventions SDOH Screenings   Food Insecurity: No Food Insecurity (10/06/2022)  Housing: Low Risk  (10/06/2022)  Transportation Needs: No Transportation Needs (10/06/2022)  Utilities: Not At Risk (10/06/2022)  Tobacco Use: Medium Risk (10/07/2022)    Readmission Risk Interventions     No data to display

## 2022-10-10 NOTE — Plan of Care (Signed)

## 2022-10-10 NOTE — Progress Notes (Signed)
Progress Note   Patient: Nicolas Solis [id number] DOB: 1951/07/06 DOA: 10/06/2022     4 DOS: the patient was seen and examined on 10/10/2022   Brief hospital course: Mr. Jenner Skau 72 year old male with history of hyperlipidemia, Parkinson's on Sinemet, former tobacco user 50+ years, history of L1 compression fracture, who presents emergency department for chief concerns of weakness and imbalance.  Of note, patient fell at home about 2 to 3 weeks ago hitting his head on the stair banister while walking up the stairs.  Vitals in the ED showed temperature of 98.2, respiration rate of 19, heart rate 83, blood pressure 149/85, SpO2 98% on room air.  Serum sodium is 121, potassium 3.7, chloride 88, bicarb 26, BUN of 9, serum creatinine 0.43, EGFR greater than 60, nonfasting blood glucose 125, WBC 6.2, hemoglobin 12.7, platelets of 218.  High sensitive troponin is 15 and on repeat was 15.  UA was negative for leukocytes and nitrates.  CT of the head without contrast: Was read as no acute intracranial abnormality.  CT lumbar spine: Acute nondisplaced fracture of the left S1 transverse process.  Acute nondisplaced fracture of left sacral ala which extensive left SI joint.  No substantial change to chronic severe L1 burst fracture with mild bony retropulsion and severe central height loss.  CT thoracic: Mild superior endplate irregularity and height loss in T1 and T2 vertebral bodies.  Age indeterminate.  ED treatment: Lidocaine patch one-time dose, acetaminophen 1 g IV one-time dose, sodium chloride 150 mL bolus one-time dose given.  And morphine 2 mg IV was ordered and family declined.  Assessment and Plan:  Hyponatremia Patient has been on fairly aggressive IV crystalloid therapy without improvement in serum sodium.   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   Patient received 1 dose of [place] on 3/11 with good improvement in sodium. Sodium initially improved to 128 but  shows a downward trend.CT chest negative for acute issues. Appreciate nephrology input.   Patient has been started on sodium chloride tablets  L1 vertebral fracture ([place]) With left S1 transverse fracture Sacral alla fractures. Discussed with neurosurgery Dr. Cari Caraway.  Vertebral fracture is stable and will not require surgical intervention at this time. LSO  brace  recommended per neurosurgery for comfort while ambulating.  Symptomatic support.  Pain control.   Neurosurgery recommends outpatient follow-up with orthopedic trauma surgeon Dr. Doreatha Martin in [place].  Will add to discharge     Left rib fracture Left anterolateral 6th-10th ribs Incentive spirometry and flutter valve ordered Lidocaine patch daily ordered Toradol 15 mg IV every 6 hours as needed for moderate pain Discussed with wife who would rather have a different pain medication other than morphine. Patient is currently on tramadol 50 mg every 6 as needed     Alcohol abuse Patient has been drinking at least 2 drinks of bourbon nightly despite poor p.o. intake over the last 2 weeks Counseled patient on this excessive EtOH intake in setting of poor p.o. intake otherwise Thiamine 100 mg p.o. daily, folic acid 1 mg daily ordered Ativan 2 mg IV every 6 hours.  For anxiety     Hypomagnesemia Monitor and replace as necessary   Frequent falls With increasing weakness and poor PO intake Presumed secondary to hyponatremia, fall precaution Optimize electrolytes PT, OT, TOC were consulted Recommendation for skilled nursing facility Patient has been accepted at [facility] rehab and will likely be discharged in a.m.   Parkinson's disease Carbidopa-levodopa immediate release 25-100 mg tablet, 2 tablets  3 times daily before meals (spouse request 30 minutes before each meal) Carbidopa-levodopa controlled release 25-100, 1 tablet nightly were resumed             Subjective: Family at the bedside.  Complains of left lateral  chest wall pain.  Physical Exam: Vitals:   10/09/22 2336 10/10/22 0327 10/10/22 0808 10/10/22 0851  BP: 132/69 130/70 (!) 144/79 (!) 148/80  Pulse: 74 72 73 72  Resp: '14 15 18 15  '$ Temp: 98.3 F (36.8 C) 98.1 F (36.7 C) (!) 97.5 F (36.4 C) (!) 97.4 F (36.3 C)  TempSrc: Oral Oral Oral   SpO2: 94% 96% 97% 98%  Weight:      Height:       General exam: No acute distress Respiratory system: Lungs clear.  Normal work of breathing.  Room air Cardiovascular system: S1-S2, RRR, no murmurs, no pedal edema Gastrointestinal system: Soft, NT/ND, normal bowel sounds Central nervous system: Alert, oriented x 3, no focal deficits Extremities: Decreased power bilateral lower extremities.  Gait not assessed Skin: No rashes, lesions or ulcers Psychiatry: Judgement and insight appear normal. Mood & affect flattened.   Data Reviewed: Labs reviewed.  Sodium 126 There are no new results to review at this time.  Family Communication: Greater than 50% of time was spent discussing plan of care with patient's wife at the bedside.  All questions and concerns have been addressed.  She verbalizes understanding and agrees with the plan.  Disposition: Status is: Inpatient Remains inpatient appropriate because: Awaiting discharge to rehab.  Planned Discharge Destination: Rehab    Time spent: 30 minutes  Author: Collier Bullock, MD 10/10/2022 1:05 PM  For on call review [email or web address].

## 2022-10-10 NOTE — Progress Notes (Signed)
Pt received in bed with 5/10 LBP after getting washed up. ModA to transfer to sitting EOB with use of side rail. Pt declined LSO brace for comfort. Sit to stand at [facility] with [place] from raised bed. Pt moaning out in pain (received Tramadol 2 hours prior). Pt able to shuffle feet towards chair, slightly unsteady, and tremorous. Unable to tolerate further gait. Pt reclined to comfort in chair. With all needs met Therapist returned in pm, pt back in bed with 6/10 pain stating he was unable to tolerate any activity.     10/10/22 1145  PT Visit Information  Assistance Needed +1  History of Present Illness Pt is a 72 year old male with PMH that includes hyperlipidemia, Parkinson's disease, former tobacco user 50+ years, and L1 compression fracture who presents emergency department for chief concerns of weakness and imbalance.  Of note the patient fell at home about 2 to 3 weeks prior to admission hitting his head on the stair banister while walking up the stairs and since then he has been feeling weak.  MD assessment includes: hyponatremia, L1 vertebral fracture, L rib fractures, alcohol abuse, hypomagnesemia, and falls.  Subjective Data  Subjective 5/10 LBP  Patient Stated Goal Decreased pain  Precautions  Precautions Fall  Precaution Comments  (Has LSO for comfort)  Restrictions  Weight Bearing Restrictions Yes  RLE Weight Bearing WBAT  LLE Weight Bearing WBAT  Other Position/Activity Restrictions LSO for comfort  Pain Assessment  Pain Assessment 0-10  Pain Score 5  Pain Location back  Pain Descriptors / Indicators Aching;Grimacing;Moaning  Pain Intervention(s) Limited activity within patient's tolerance;Patient requesting pain meds-RN notified  Cognition  Arousal/Alertness Awake/alert  Behavior During Therapy WFL for tasks assessed/performed  Overall Cognitive Status Within Functional Limits for tasks assessed  Bed Mobility  Overal bed mobility Needs Assistance  Bed Mobility Sidelying to  Sit;Sit to Sidelying  Rolling Min assist  Sidelying to sit Mod assist  Sit to sidelying Mod assist  General bed mobility comments Log roll training with mod A for BLE and trunk control and mod multi-modal cues for sequencing  Transfers  Overall transfer level Needs assistance  Equipment used Rolling walker (2 wheels)  Transfers Sit to/from Stand  Sit to Stand Mod assist;From elevated surface  General transfer comment Mod verbal cues for hand placement with mod A to come to full upright standing  Ambulation/Gait  Ambulation/Gait assistance Min guard  Gait Distance (Feet) 3 Feet  Assistive device Rolling walker (2 wheels)  Gait Pattern/deviations Step-to pattern;Decreased step length - right;Decreased step length - left;Shuffle  General Gait Details  (Only able to tolerate short short distance to bedside chair)  Gait velocity decreased  Balance  Overall balance assessment Needs assistance;History of Falls  Sitting-balance support Bilateral upper extremity supported;Feet supported  Sitting balance-Leahy Scale Good  Standing balance support Bilateral upper extremity supported;During functional activity;Reliant on assistive device for balance  Standing balance-Leahy Scale Fair  Standing balance comment Increased back pain in standing  General Comments  General comments (skin integrity, edema, etc.)  (Education provided to pt and wife regarding role of PT and goals. Benefits of increasing mobility and ROM exercises.)  PT - End of Session  Equipment Utilized During Treatment Gait belt;Back brace  Activity Tolerance Patient limited by pain  Patient left in chair;with call bell/phone within reach;with chair alarm set;with family/visitor present   PT - Assessment/Plan  PT Plan Current plan remains appropriate  PT Visit Diagnosis History of falling (Z91.81);Muscle weakness (generalized) (M62.81);Difficulty in walking, not  elsewhere classified (R26.2);Pain  Pain - part of body  (back)  PT  Frequency (ACUTE ONLY) 7X/week  Follow Up Recommendations Skilled nursing-short term rehab (<3 hours/day)  Can patient physically be transported by private vehicle No  Assistance recommended at discharge Frequent or constant Supervision/Assistance  Patient can return home with the following A lot of help with walking and/or transfers;A lot of help with bathing/dressing/bathroom;Assistance with cooking/housework;Assist for transportation;Help with stairs or ramp for entrance  PT equipment Other (comment)  AM-PAC PT "6 Clicks" Mobility Outcome Measure (Version 2)  Help needed turning from your back to your side while in a flat bed without using bedrails? 2  Help needed moving from lying on your back to sitting on the side of a flat bed without using bedrails? 2  Help needed moving to and from a bed to a chair (including a wheelchair)? 2  Help needed standing up from a chair using your arms (e.g., wheelchair or bedside chair)? 2  Help needed to walk in hospital room? 2  Help needed climbing 3-5 steps with a railing?  1  6 Click Score 11  Consider Recommendation of Discharge To: CIR/SNF/LTACH  Progressive Mobility  What is the highest level of mobility based on the progressive mobility assessment? Level 3 (Stands with assist) - Balance while standing  and cannot march in place  Activity Transferred from bed to chair  PT Time Calculation  PT Start Time (ACUTE ONLY) 1115  PT Stop Time (ACUTE ONLY) 1140  PT Time Calculation (min) (ACUTE ONLY) 25 min  PT General Charges  $$ ACUTE PT VISIT 1 Visit  PT Treatments  $Gait Training 8-22 mins  $Therapeutic Activity 8-22 mins   Mikel Cella, PTA

## 2022-10-10 NOTE — Plan of Care (Signed)
  Problem: Education: Goal: Knowledge of General Education information will improve Description: Including pain rating scale, medication(s)/side effects and non-pharmacologic comfort measures 10/10/2022 2222 by Noah Delaine, RN Outcome: Progressing 10/10/2022 2221 by Noah Delaine, RN Outcome: Progressing   Problem: Activity: Goal: Risk for activity intolerance will decrease Outcome: Progressing   Problem: Nutrition: Goal: Adequate nutrition will be maintained Outcome: Progressing   Problem: Pain Managment: Goal: General experience of comfort will improve 10/10/2022 2222 by Noah Delaine, RN Outcome: Progressing 10/10/2022 2221 by Noah Delaine, RN Outcome: Progressing   Problem: Safety: Goal: Ability to remain free from injury will improve 10/10/2022 2222 by Noah Delaine, RN Outcome: Progressing 10/10/2022 2221 by Noah Delaine, RN Outcome: Progressing   Problem: Skin Integrity: Goal: Risk for impaired skin integrity will decrease 10/10/2022 2222 by Noah Delaine, RN Outcome: Progressing 10/10/2022 2221 by Noah Delaine, RN Outcome: Progressing

## 2022-10-10 NOTE — Progress Notes (Signed)
Occupational Therapy Treatment Patient Details Name: Nicolas Solis MRN: [id number] DOB: [birth year] Today's Date: 10/10/2022   History of present illness Pt is a 72 year old male with PMH that includes hyperlipidemia, Parkinson's disease, former tobacco user 50+ years, and L1 compression fracture who presents emergency department for chief concerns of weakness and imbalance.  Of note the patient fell at home about 2 to 3 weeks prior to admission hitting his head on the stair banister while walking up the stairs and since then he has been feeling weak.  MD assessment includes: hyponatremia, L1 vertebral fracture, L rib fractures, alcohol abuse, hypomagnesemia, and falls.   OT comments  Upon entering the room, pt supine in bed with no c/o pain and needing maximal encouragement for OOB activity. Pt refuses all self care tasks. OT discussed plans for attempts to stand and ambulate during session and pt reports, "well I guess my wife would be happy". Mod A for log roll to EOB. Pt sitting EOB for ~ 2 minutes and trying to reposition himself back in supine. Pt stands with mod A from standard bed height with RW. Pt takes side steps along bed towards the R with min A and RW. Pt taking seated rest on EOB and returns himself to supine with mod A to maintain log roll. Bed alarm activated and call bell within reach.    Recommendations for follow up therapy are one component of a multi-disciplinary discharge planning process, led by the attending physician.  Recommendations may be updated based on patient status, additional functional criteria and insurance authorization.    Follow Up Recommendations  Skilled nursing-short term rehab (<3 hours/day)     Assistance Recommended at Discharge Frequent or constant Supervision/Assistance  Patient can return home with the following  A lot of help with walking and/or transfers;A lot of help with bathing/dressing/bathroom;Assist for transportation;Assistance with  cooking/housework;Help with stairs or ramp for entrance   Equipment Recommendations  Other (comment) (defer to next venue of care)       Precautions / Restrictions Precautions Precautions: Fall Restrictions Weight Bearing Restrictions: Yes RLE Weight Bearing: Weight bearing as tolerated LLE Weight Bearing: Weight bearing as tolerated Other Position/Activity Restrictions: TLSO for comfort with ambulation       Mobility Bed Mobility Overal bed mobility: Needs Assistance Bed Mobility: Sit to Sidelying, Sidelying to Sit   Sidelying to sit: Mod assist     Sit to sidelying: Mod assist General bed mobility comments: Log roll training with mod A for BLE and trunk control and mod multi-modal cues for sequencing    Transfers Overall transfer level: Needs assistance Equipment used: Rolling walker (2 wheels) Transfers: Sit to/from Stand, Bed to chair/wheelchair/BSC Sit to Stand: Mod assist     Step pivot transfers: Mod assist           Balance Overall balance assessment: Needs assistance, History of Falls Sitting-balance support: Bilateral upper extremity supported, Feet supported Sitting balance-Leahy Scale: Good     Standing balance support: Bilateral upper extremity supported, During functional activity, Reliant on assistive device for balance Standing balance-Leahy Scale: Fair                             ADL either performed or assessed with clinical judgement   ADL  General ADL Comments: pt declined self care tasks    Extremity/Trunk Assessment Upper Extremity Assessment Upper Extremity Assessment: Generalized weakness   Lower Extremity Assessment Lower Extremity Assessment: Generalized weakness        Vision Patient Visual Report: No change from baseline            Cognition Arousal/Alertness: Awake/alert Behavior During Therapy: WFL for tasks assessed/performed Overall Cognitive  Status: Within Functional Limits for tasks assessed                                                     Pertinent Vitals/ Pain       Pain Assessment Pain Assessment: 0-10 Pain Score: 6  Pain Location: back Pain Descriptors / Indicators: Aching, Grimacing, Moaning Pain Intervention(s): Monitored during session, Premedicated before session, Repositioned         Frequency  Min 2X/week        Progress Toward Goals  OT Goals(current goals can now be found in the care plan section)  Progress towards OT goals: Progressing toward goals     Plan Discharge plan remains appropriate;Frequency remains appropriate       AM-PAC OT "6 Clicks" Daily Activity     Outcome Measure   Help from another person eating meals?: None Help from another person taking care of personal grooming?: A Little Help from another person toileting, which includes using toliet, bedpan, or urinal?: A Lot Help from another person bathing (including washing, rinsing, drying)?: A Lot Help from another person to put on and taking off regular upper body clothing?: A Little Help from another person to put on and taking off regular lower body clothing?: A Lot 6 Click Score: 16    End of Session Equipment Utilized During Treatment: Rolling walker (2 wheels)  OT Visit Diagnosis: Unsteadiness on feet (R26.81);Repeated falls (R29.6);Muscle weakness (generalized) (M62.81);History of falling (Z91.81)   Activity Tolerance Patient limited by pain   Patient Left in bed;with bed alarm set   Nurse Communication Mobility status;Patient requests pain meds        Time: 1351-1405 OT Time Calculation (min): 14 min  Charges: OT General Charges $OT Visit: 1 Visit OT Treatments $Therapeutic Activity: 8-22 mins  Darleen Crocker, MS, OTR/L , CBIS ascom [phone]  10/10/22, 3:36 PM

## 2022-10-11 DIAGNOSIS — E871 Hypo-osmolality and hyponatremia: Secondary | ICD-10-CM | POA: Diagnosis not present

## 2022-10-11 LAB — BASIC METABOLIC PANEL
Anion gap: 12 (ref 5–15)
BUN: 14 mg/dL (ref 8–23)
CO2: 23 mmol/L (ref 22–32)
Calcium: 8.9 mg/dL (ref 8.9–10.3)
Chloride: 92 mmol/L — ABNORMAL LOW (ref 98–111)
Creatinine, Ser: 0.49 mg/dL — ABNORMAL LOW (ref 0.61–1.24)
GFR, Estimated: >60 mL/min (ref >60)
Glucose, Bld: 104 mg/dL — ABNORMAL HIGH (ref 70–99)
Potassium: 4 mmol/L (ref 3.5–5.1)
Sodium: 127 mmol/L — ABNORMAL LOW (ref 135–145)

## 2022-10-11 MED ORDER — SODIUM CHLORIDE 1 G PO TABS: 1.0000 g | ORAL_TABLET | Freq: Two times a day (BID) | ORAL | 0 refills | Status: AC

## 2022-10-11 MED ORDER — POLYETHYLENE GLYCOL 3350 17 G PO PACK
17.0000 g | PACK | Freq: Every day | ORAL | Status: DC
  Administered 2022-10-11: 17 g via ORAL
  Filled 2022-10-11: qty 1

## 2022-10-11 MED ORDER — TRAMADOL HCL 50 MG PO TABS: 50.0000 mg | ORAL_TABLET | Freq: Four times a day (QID) | ORAL | 0 refills | Status: DC | PRN

## 2022-10-11 MED ORDER — TRAMADOL HCL 50 MG PO TABS: 50.0000 mg | ORAL_TABLET | Freq: Four times a day (QID) | ORAL | 0 refills | Status: AC | PRN

## 2022-10-11 MED ORDER — FOLIC ACID 1 MG PO TABS: 1.0000 mg | ORAL_TABLET | Freq: Every day | ORAL | 0 refills | Status: AC

## 2022-10-11 MED ORDER — LIDOCAINE 5 % EX PTCH: 1.0000 | MEDICATED_PATCH | CUTANEOUS | 0 refills | Status: AC

## 2022-10-11 MED ORDER — POLYETHYLENE GLYCOL 3350 17 G PO PACK: 17.0000 g | PACK | Freq: Every day | ORAL | 0 refills | Status: DC

## 2022-10-11 MED ORDER — THIAMINE HCL 100 MG PO TABS: 100.0000 mg | ORAL_TABLET | Freq: Every day | ORAL | 0 refills | Status: AC

## 2022-10-11 NOTE — Discharge Instructions (Signed)
Wear LSO brace with ambulation Needs repeat BMP in 1 week Keep scheduled follow-up appointment with Dr Doreatha Martin

## 2022-10-11 NOTE — Care Management Important Message (Signed)
Important Message  Patient Details  Name: Nicolas Solis MRN: [id number] Date of Birth: [birth year]   Medicare Important Message Given:  Yes     Dannette Barbara 10/11/2022, 11:09 AM

## 2022-10-11 NOTE — Progress Notes (Signed)
[facility]  ROUNDING NOTE   Subjective:   Patient seen sitting up in bed, wife at bedside Appetite continues to improve slowly Denies nausea or vomiting  Sodium 127 Urine output 1.55 L  Objective:  Vital signs in last 24 hours:  Temp:  [97.3 F (36.3 C)-97.5 F (36.4 C)] 97.3 F (36.3 C) (03/15 0831) Pulse Rate:  [72-75] 75 (03/15 0831) Resp:  [17-18] 18 (03/15 0831) BP: (124-150)/(77-80) 150/78 (03/15 0831) SpO2:  [96 %-98 %] 98 % (03/15 0831)  Weight change:  Filed Weights   10/06/22 1227  Weight: 80.9 kg    Intake/Output: I/O last 3 completed shifts: In: 480 [P.O.:480] Out: 2050 [Urine:2050]   Intake/Output this shift:  Total I/O In: 360 [P.O.:360] Out: -   Physical Exam: General: NAD  Head: Normocephalic, atraumatic. Moist oral mucosal membranes  Eyes: Anicteric  Lungs:  Clear to auscultation, normal effort, room air  Heart: Regular rate and rhythm  Abdomen:  Soft, nontender  Extremities: No peripheral edema.  Neurologic: Nonfocal, moving all four extremities  Skin: No lesions  Access: None    Basic Metabolic Panel: Recent Labs  Lab 10/06/22 1445 10/06/22 1717 10/07/22 0336 10/07/22 1333 10/07/22 2037 10/08/22 0436 10/09/22 0936 10/10/22 0912 10/11/22 0820  NA  --    < > 122* 123* 124* 128* 127* 126* 127*  K  --   --  3.6 3.8  --   --  3.8 3.9 4.0  CL  --   --  91* 91*  --   --  95* 93* 92*  CO2  --   --  21* 26  --   --  25 23 23   GLUCOSE  --   --  112* 114*  --   --  134* 102* 104*  BUN  --   --  10 9  --   --  12 13 14   CREATININE  --   --  0.48* 0.44*  --   --  0.57* 0.55* 0.49*  CALCIUM  --   --  7.7* 8.4*  --   --  8.6* 8.6* 8.9  MG 1.6*  --  1.8  --   --   --   --   --   --   PHOS 3.0  --   --   --   --   --   --   --   --    < > = values in this interval not displayed.     Liver Function Tests: Recent Labs  Lab 10/07/22 0936 10/07/22 1333  AST 49* 49*  ALT 14 6  ALKPHOS 83 91  BILITOT 0.8 0.7  PROT 6.3*  7.9  ALBUMIN 3.2* 3.8    No results for input(s): "LIPASE", "AMYLASE" in the last 168 hours. No results for input(s): "AMMONIA" in the last 168 hours.  CBC: Recent Labs  Lab 10/06/22 1231 10/07/22 0336  WBC 6.2 5.3  NEUTROABS 4.6  --   HGB 12.7* 11.0*  HCT 35.5* 30.5*  MCV 96.7 97.1  PLT 218 193     Cardiac Enzymes: No results for input(s): "CKTOTAL", "CKMB", "CKMBINDEX", "TROPONINI" in the last 168 hours.  BNP: Invalid input(s): "POCBNP"  CBG: No results for input(s): "GLUCAP" in the last 168 hours.  Microbiology: Results for orders placed or performed during the hospital encounter of 10/06/22  Resp panel by RT-PCR (RSV, Flu A&B, Covid) Anterior Nasal Swab     Status: None   Collection Time: 10/06/22  12:31 PM   Specimen: Anterior Nasal Swab  Result Value Ref Range Status   SARS Coronavirus 2 by RT PCR NEGATIVE NEGATIVE Final    Comment: (NOTE) SARS-CoV-2 target nucleic acids are NOT DETECTED.  The SARS-CoV-2 RNA is generally detectable in upper respiratory specimens during the acute phase of infection. The lowest concentration of SARS-CoV-2 viral copies this assay can detect is 138 copies/mL. A negative result does not preclude SARS-Cov-2 infection and should not be used as the sole basis for treatment or other patient management decisions. A negative result may occur with  improper specimen collection/handling, submission of specimen other than nasopharyngeal swab, presence of viral mutation(s) within the areas targeted by this assay, and inadequate number of viral copies(<138 copies/mL). A negative result must be combined with clinical observations, patient history, and epidemiological information. The expected result is Negative.  Fact Sheet for Patients:  EntrepreneurPulse.com.au  Fact Sheet for Healthcare Providers:  IncredibleEmployment.be  This test is no t yet approved or cleared by the [place] FDA and  has  been authorized for detection and/or diagnosis of SARS-CoV-2 by FDA under an Emergency Use Authorization (EUA). This EUA will remain  in effect (meaning this test can be used) for the duration of the COVID-19 declaration under Section 564(b)(1) of the Act, 21 U.S.C.section 360bbb-3(b)(1), unless the authorization is terminated  or revoked sooner.       Influenza A by PCR NEGATIVE NEGATIVE Final   Influenza B by PCR NEGATIVE NEGATIVE Final    Comment: (NOTE) The Xpert Xpress SARS-CoV-2/FLU/RSV plus assay is intended as an aid in the diagnosis of influenza from Nasopharyngeal swab specimens and should not be used as a sole basis for treatment. Nasal washings and aspirates are unacceptable for Xpert Xpress SARS-CoV-2/FLU/RSV testing.  Fact Sheet for Patients: EntrepreneurPulse.com.au  Fact Sheet for Healthcare Providers: IncredibleEmployment.be  This test is not yet approved or cleared by the [place] FDA and has been authorized for detection and/or diagnosis of SARS-CoV-2 by FDA under an Emergency Use Authorization (EUA). This EUA will remain in effect (meaning this test can be used) for the duration of the COVID-19 declaration under Section 564(b)(1) of the Act, 21 U.S.C. section 360bbb-3(b)(1), unless the authorization is terminated or revoked.     Resp Syncytial Virus by PCR NEGATIVE NEGATIVE Final    Comment: (NOTE) Fact Sheet for Patients: EntrepreneurPulse.com.au  Fact Sheet for Healthcare Providers: IncredibleEmployment.be  This test is not yet approved or cleared by the [place] FDA and has been authorized for detection and/or diagnosis of SARS-CoV-2 by FDA under an Emergency Use Authorization (EUA). This EUA will remain in effect (meaning this test can be used) for the duration of the COVID-19 declaration under Section 564(b)(1) of the Act, 21 U.S.C. section 360bbb-3(b)(1), unless the  authorization is terminated or revoked.  Performed at [facility], [address] [place], [place] [address]     Coagulation Studies: No results for input(s): "LABPROT", "INR" in the last 72 hours.  Urinalysis: No results for input(s): "COLORURINE", "LABSPEC", "PHURINE", "GLUCOSEU", "HGBUR", "BILIRUBINUR", "KETONESUR", "PROTEINUR", "UROBILINOGEN", "NITRITE", "LEUKOCYTESUR" in the last 72 hours.  Invalid input(s): "APPERANCEUR"     Imaging: No results found.   Medications:     atorvastatin  20 mg Oral QHS   carbidopa-levodopa  2 tablet Oral TID AC   Carbidopa-Levodopa ER  1 tablet Oral QHS   cholecalciferol  2,000 Units Oral q AM   cyanocobalamin  2,500 mcg Oral QPM   dorzolamide  1 drop  Both Eyes Daily   And   timolol  1 drop Both Eyes Daily   enoxaparin (LOVENOX) injection  40 mg Subcutaneous Q24H   lidocaine  1 patch Transdermal Q24H   polyethylene glycol  17 g Oral Daily   ranolazine  500 mg Oral BID   sodium chloride  1 g Oral BID WC   tamsulosin  0.4 mg Oral QPC supper   acetaminophen **OR** acetaminophen, nitroGLYCERIN, ondansetron **OR** ondansetron (ZOFRAN) IV, senna-docusate, traMADol  Assessment/ Plan:  Nicolas Solis is a 72 y.o.  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 Patient has received normal saline infusion without adequate response. Sodium 121 on admission. Tolvaptan 15 mg on 10/07/2022 Sodium increased to 127 Patient cleared to discharge from renal stance, recommend  continuing salt tabs as prescribed.      LOS: 5 Mahonri Seiden 3/15/202411:49 AM

## 2022-10-11 NOTE — Progress Notes (Signed)
EMS transported pt from [facility] to [facility].

## 2022-10-11 NOTE — Progress Notes (Signed)
Contacted [facility] and reported to Nurse Crystal.

## 2022-10-11 NOTE — Plan of Care (Signed)
  Problem: Education: Goal: Knowledge of General Education information will improve Description: Including pain rating scale, medication(s)/side effects and non-pharmacologic comfort measures Outcome: Progressing   Problem: Pain Managment: Goal: General experience of comfort will improve Outcome: Progressing   Problem: Safety: Goal: Ability to remain free from injury will improve Outcome: Progressing   Problem: Elimination: Goal: Will not experience complications related to bowel motility Outcome: Progressing   Problem: Nutrition: Goal: Adequate nutrition will be maintained Outcome: Progressing   Problem: Skin Integrity: Goal: Risk for impaired skin integrity will decrease Outcome: Progressing   Problem: Safety: Goal: Ability to remain free from injury will improve Outcome: Progressing

## 2022-10-11 NOTE — TOC Progression Note (Signed)
Transition of Care [facility]) - Progression Note    Patient Details  Name: Nicolas Solis MRN: [id number] Date of Birth: 06/10/1951  Transition of Care [facility]) CM/SW [place], RN Phone Number: 10/11/2022, 12:19 PM  Clinical Narrative:      Called the patient's wife and let her know that he will go to room 7 at [facility] EMS called to transport, he is 3rd on the list      Expected Discharge Plan and Services         Expected Discharge Date: 10/11/22                                     Social Determinants of Health (SDOH) Interventions [place]: No Food Insecurity (10/06/2022)  Housing: Low Risk  (10/06/2022)  Transportation Needs: No Transportation Needs (10/06/2022)  Utilities: Not At Risk (10/06/2022)  Tobacco Use: Medium Risk (10/07/2022)    Readmission Risk Interventions     No data to display

## 2022-10-11 NOTE — Discharge Summary (Signed)
Physician Discharge Summary   Patient: Nicolas Solis MRN: [id number] DOB: 13-Jan-1951  Admit date:     10/06/2022  Discharge date: 10/11/22  Discharge Physician: Alek Poncedeleon   PCP: Tracie Harrier, MD   Recommendations at discharge:   Patient is being discharged to [facility] Take medications as recommended Follow-up with  Dr. Doreatha Martin in [place] in 5 - 7 days   Discharge Diagnoses: Principal Problem:   Hyponatremia Active Problems:   Coronary artery disease   Parkinson's disease   Hyperlipidemia   Frequent falls   Hypomagnesemia   Alcohol abuse   Left rib fracture   L1 vertebral fracture (HCC)   Closed fracture of transverse process of lumbar vertebra (HCC)  Resolved Problems:   * No resolved hospital problems. [facility] Course: Nicolas Solis 72 year old male with history of hyperlipidemia, Parkinson's on Sinemet, former tobacco user 50+ years, history of L1 compression fracture, who presents emergency department for chief concerns of weakness and imbalance.  Of note, patient fell at home about 2 to 3 weeks ago hitting his head on the stair banister while walking up the stairs.  Vitals in the ED showed temperature of 98.2, respiration rate of 19, heart rate 83, blood pressure 149/85, SpO2 98% on room air.  Serum sodium is 121, potassium 3.7, chloride 88, bicarb 26, BUN of 9, serum creatinine 0.43, EGFR greater than 60, nonfasting blood glucose 125, WBC 6.2, hemoglobin 12.7, platelets of 218.  High sensitive troponin is 15 and on repeat was 15.  UA was negative for leukocytes and nitrates.  CT of the head without contrast: Was read as no acute intracranial abnormality.  CT lumbar spine: Acute nondisplaced fracture of the left S1 transverse process.  Acute nondisplaced fracture of left sacral ala which extensive left SI joint.  No substantial change to chronic severe L1 burst fracture with mild bony retropulsion and severe central height  loss.  CT thoracic: Mild superior endplate irregularity and height loss in T1 and T2 vertebral bodies.  Age indeterminate.  ED treatment: Lidocaine patch one-time dose, acetaminophen 1 g IV one-time dose, sodium chloride 150 mL bolus one-time dose given.  And morphine 2 mg IV was ordered and family declined.  Assessment and Plan: Hyponatremia secondary to SIADH Patient was on  fairly aggressive IV crystalloid therapy without improvement in serum sodium.    Patient received 1 dose of Samsca on 3/11 with good improvement in sodium. Sodium initially improved to 128 and on discharge is 127. CT chest negative for acute issues. Appreciate nephrology input.   Patient has been started on sodium chloride tablets and will be discharged on same Repeat BMP in 1 week    L1 vertebral fracture ([place]) With left S1 transverse fracture Sacral alla fractures. Discussed with neurosurgery Dr. Cari Caraway.   Vertebral fracture is stable and will not require surgical intervention at this time. LSO  brace  recommended per neurosurgery for comfort while ambulating.  Symptomatic support.  Pain control.   Neurosurgery recommends outpatient follow-up with orthopedic trauma surgeon Dr. Doreatha Martin in [place].  Will add to discharge     Left rib fracture Left anterolateral 6th-10th ribs Incentive spirometry and flutter valve ordered Lidocaine patch daily ordered Tramadol as needed for pain     Alcohol abuse Patient has been drinking at least 2 drinks of bourbon nightly despite poor p.o. intake over the last 2 weeks Counseled patient on this excessive EtOH intake in setting of poor p.o. intake otherwise Thiamine 100 mg p.o. daily,  folic acid 1 mg daily ordered      Hypomagnesemia Monitor and replace as necessary   Frequent falls With increasing weakness and poor PO intake Presumed secondary to hyponatremia, fall precaution Optimize electrolytes PT, OT, TOC were consulted Recommendation for skilled  nursing facility Patient has been accepted at [facility] rehab    Parkinson's disease Carbidopa-levodopa immediate release 25-100 mg tablet, 2 tablets 3 times daily before meals (spouse request 30 minutes before each meal) Carbidopa-levodopa controlled release 25-100, 1 tablet nightly were resumed          Consultants: Nephrology, neurosurgery Procedures performed: None Disposition: Skilled nursing facility Diet recommendation:  Discharge Diet Orders (From admission, onward)     Start     Ordered   10/11/22 0000  Diet - low sodium heart healthy        10/11/22 1045           Regular diet DISCHARGE MEDICATION: Allergies as of 10/11/2022   No Known Allergies      Medication List     STOP taking these medications    ibuprofen 200 MG tablet Commonly known as: ADVIL   nicotine polacrilex 2 MG gum Commonly known as: NICORETTE   nystatin powder Generic drug: nystatin       TAKE these medications    acetaminophen 500 MG tablet Commonly known as: TYLENOL Take 1,000 mg by mouth every 6 (six) hours as needed (for pain.).   ADULT ONE DAILY GUMMIES PO Take 2 tablets by mouth every evening.   aspirin EC 81 MG tablet Take 1 tablet (81 mg total) by mouth daily. Swallow whole.   atorvastatin 20 MG tablet Commonly known as: LIPITOR Take 1 tablet (20 mg total) by mouth daily.   carbidopa-levodopa 25-100 MG tablet Commonly known as: SINEMET IR Take 2 tablets by mouth 3 (three) times daily before meals.   Carbidopa-Levodopa ER 25-100 MG tablet controlled release Commonly known as: SINEMET CR Take 1 tablet by mouth at bedtime.   dorzolamide-timolol 2-0.5 % ophthalmic solution Commonly known as: COSOPT Place 1 drop into both eyes daily.   FISH OIL PO Take 1,000 mg by mouth in the morning.   folic acid 1 MG tablet Commonly known as: FOLVITE Take 1 tablet (1 mg total) by mouth daily.   lidocaine 5 % Commonly known as: LIDODERM Place 1 patch onto the skin  daily. Remove & Discard patch within 12 hours or as directed by MD   nitroGLYCERIN 0.4 MG SL tablet Commonly known as: NITROSTAT Place 1 tablet (0.4 mg total) under the tongue every 5 (five) minutes as needed for chest pain. For a max of 3 doses in 1 day.   polyethylene glycol 17 g packet Commonly known as: MIRALAX / GLYCOLAX Take 17 g by mouth daily.   ranolazine 500 MG 12 hr tablet Commonly known as: Ranexa Take 1 tablet (500 mg total) by mouth 2 (two) times daily.   sodium chloride 1 g tablet Take 1 tablet (1 g total) by mouth 2 (two) times daily with a meal for 10 days.   tamsulosin 0.4 MG Caps capsule Commonly known as: FLOMAX Take 0.4 mg by mouth daily after supper.   thiamine 100 MG tablet Commonly known as: VITAMIN B1 Take 1 tablet (100 mg total) by mouth daily.   traMADol 50 MG tablet Commonly known as: ULTRAM Take 1 tablet (50 mg total) by mouth every 6 (six) hours as needed for up to 7 days for moderate pain or severe pain.  Vitamin B-12 5000 MCG Lozg Take 2,500 mcg by mouth every evening.   Vitamin D3 50 MCG (2000 UT) Tabs Take 2,000 Units by mouth in the morning.        Contact information for follow-up providers     Haddix, Thomasene Lot, MD. Schedule an appointment as soon as possible for a visit in 1 week(s).   Specialty: Orthopedic Surgery Contact information: [place] [address] [phone]              Contact information for after-discharge care     [facility] .   Service: Skilled Nursing Contact information: [place] [phone]                    Discharge Exam: Danley Danker Weights   10/06/22 1227  Weight: 80.9 kg    General exam: No acute distress Respiratory system: Lungs clear.  Normal work of breathing.  Room air Cardiovascular system: S1-S2, RRR, no murmurs, no pedal edema Gastrointestinal system: Soft, NT/ND,  normal bowel sounds Central nervous system: Alert, oriented x 3, no focal deficits Extremities: Decreased power bilateral lower extremities.  Gait not assessed Skin: No rashes, lesions or ulcers Psychiatry: Judgement and insight appear normal. Mood & affect flattened.   Condition at discharge: stable  The results of significant diagnostics from this hospitalization (including imaging, microbiology, ancillary and laboratory) are listed below for reference.   Imaging Studies: CT Angio Chest Pulmonary Embolism (PE) W or WO Contrast  Result Date: 10/07/2022 CLINICAL DATA:  72 year old male with weakness and pain. EXAM: CT ANGIOGRAPHY CHEST WITH CONTRAST TECHNIQUE: Multidetector CT imaging of the chest was performed using the standard protocol during bolus administration of intravenous contrast. Multiplanar CT image reconstructions and MIPs were obtained to evaluate the vascular anatomy. RADIATION DOSE REDUCTION: This exam was performed according to the departmental dose-optimization program which includes automated exposure control, adjustment of the mA and/or kV according to patient size and/or use of iterative reconstruction technique. CONTRAST:  62mL OMNIPAQUE IOHEXOL 350 MG/ML SOLN COMPARISON:  Prior chest radiographs FINDINGS: Cardiovascular: This is a technically adequate study but motion artifact decreases sensitivity in the LOWER lungs. No pulmonary emboli are identified. UPPER limits normal heart size and coronary artery calcifications noted. There is no evidence of thoracic aortic aneurysm. A trace pericardial effusion is present. Mediastinum/Nodes: No enlarged mediastinal, hilar, or axillary lymph nodes. Thyroid gland, trachea, and esophagus demonstrate no significant findings. Lungs/Pleura: A trace LEFT pleural effusion and mild bibasilar atelectasis noted. There is no evidence of airspace disease, consolidation, mass, suspicious nodule or pneumothorax. Upper Abdomen: No acute abnormality.  Musculoskeletal: Acute fractures of the LEFT 6th through 8th ribs noted. More inferior ribs are not visualized on this study. Review of the MIP images confirms the above findings. IMPRESSION: 1. Acute fractures of the LEFT 6th through 8th ribs. More inferior ribs are not visualized on this study. 2. Trace LEFT pleural effusion and mild bibasilar atelectasis. 3. No evidence of pulmonary emboli or thoracic aortic aneurysm. 4. Coronary artery disease Electronically Signed   By: Margarette Canada M.D.   On: 10/07/2022 17:58   CT Head Wo Contrast  Addendum Date: 10/06/2022   ADDENDUM REPORT: 10/06/2022 14:57 ADDENDUM: Correction: The left transverse fracture described in the findings and impression as at S1 is actually at L5. Also, in addition to the left sacral ala fracture described in the report there also is a nondisplaced  S2 vertebral body fracture. This was discussed with Sheran Luz via telephone at 2:56 PM. Electronically Signed   By: Margaretha Sheffield M.D.   On: 10/06/2022 14:57   Result Date: 10/06/2022 CLINICAL DATA:  Head trauma, minor (Age >= 65y); Back trauma, no prior imaging (Age >= 16y); Neck trauma (Age >= 65y) EXAM: CT HEAD WITHOUT CONTRAST CT CERVICAL SPINE WITHOUT CONTRAST CT LUMBAR SPINE WITHOUT CONTRAST TECHNIQUE: Multidetector CT imaging of the head, cervical and lumbar spine was performed following the standard protocol without intravenous contrast. Multiplanar CT image reconstructions of the cervical spine were also generated. RADIATION DOSE REDUCTION: This exam was performed according to the departmental dose-optimization program which includes automated exposure control, adjustment of the mA and/or kV according to patient size and/or use of iterative reconstruction technique. COMPARISON:  None Available. FINDINGS: CT HEAD FINDINGS Brain: No evidence of acute infarction, hemorrhage, hydrocephalus, extra-axial collection or mass lesion/mass effect. Mild for age patchy white matter hypodensities,  compatible with chronic microvascular ischemic disease. Vascular: No hyperdense vessel identified. Skull: No acute fracture. Sinuses/Orbits: Mild paranasal sinus mucosal thickening. No acute orbital findings. CT CERVICAL SPINE FINDINGS Alignment: Mild reversal and mild likely degenerative anterolisthesis of C4 on C5. Otherwise, no substantial sagittal subluxation Skull base and vertebrae: Mild superior endplate irregularity and height loss of the T1 and T2 vertebral bodies, age indeterminate. Cervical vertebral body heights are maintained Soft tissues and spinal canal: No prevertebral fluid or swelling. No visible canal hematoma. Disc levels: Multilevel facet uncovertebral hypertrophy with varying degrees of neural foraminal stenosis. Upper chest: Visualized lung apices are clear. Other: Calcific atherosclerosis. CT LUMBAR SPINE FINDINGS Alignment: Similar alignment.  No new sagittal subluxation. Vertebrae: Chronic L1 burst fracture with mild bony retropulsion with similar severe height loss centrally. No progressive height loss. No increase in bony retropulsion. Additional vertebral body heights are similar to the prior. Acute nondisplaced fracture through the left S1 transverse process (for example see series 4, image 98). Acute nondisplaced fracture through the left sacral ala which extensive left SI joint (for example see series 4, image 111). Soft tissues and spinal canal: No prevertebral fluid or swelling. No visible canal hematoma. Disc levels: Severe multifocal degenerative change including facet arthropathy with varying degrees of neural foraminal stenosis. Paraspinal: Similar versus minimally increased 31 mm infrarenal abdominal aortic aneurysm, partially imaged. Calcific atherosclerosis. IMPRESSION: CT lumbar spine: 1. Acute nondisplaced fracture through the left S1 transverse process. 2. Acute nondisplaced fracture through the left sacral ala which extensive left SI joint. 3. No substantial change in  chronic severe L1 burst fracture with mild bony retropulsion and severe central height loss. 4. Similar versus minimally increased 31 mm infrarenal abdominal aortic aneurysm, partially imaged. Recommend follow-up ultrasound every 3 years. This recommendation follows ACR consensus guidelines: White Paper of the ACR Incidental Findings Committee II on Vascular Findings. J Am Coll Radiol 2013CJ:[id number]. 5. Aortic Atherosclerosis (ICD10-I70.0). CT thoracic spine: Mild superior endplate irregularity and height loss of the T1 and T2 vertebral bodies, age indeterminate. Recommend correlation with the presence or absence of point tenderness. MRI of the thoracic spine could better assess chronicity if clinically warranted. CT head: No acute intracranial abnormality. Electronically Signed: By: Margaretha Sheffield M.D. On: 10/06/2022 14:51   CT Cervical Spine Wo Contrast  Addendum Date: 10/06/2022   ADDENDUM REPORT: 10/06/2022 14:57 ADDENDUM: Correction: The left transverse fracture described in the findings and impression as at S1 is actually at L5. Also, in addition to the left sacral ala fracture described in  the report there also is a nondisplaced S2 vertebral body fracture. This was discussed with Sheran Luz via telephone at 2:56 PM. Electronically Signed   By: Margaretha Sheffield M.D.   On: 10/06/2022 14:57   Result Date: 10/06/2022 CLINICAL DATA:  Head trauma, minor (Age >= 65y); Back trauma, no prior imaging (Age >= 16y); Neck trauma (Age >= 65y) EXAM: CT HEAD WITHOUT CONTRAST CT CERVICAL SPINE WITHOUT CONTRAST CT LUMBAR SPINE WITHOUT CONTRAST TECHNIQUE: Multidetector CT imaging of the head, cervical and lumbar spine was performed following the standard protocol without intravenous contrast. Multiplanar CT image reconstructions of the cervical spine were also generated. RADIATION DOSE REDUCTION: This exam was performed according to the departmental dose-optimization program which includes automated exposure  control, adjustment of the mA and/or kV according to patient size and/or use of iterative reconstruction technique. COMPARISON:  None Available. FINDINGS: CT HEAD FINDINGS Brain: No evidence of acute infarction, hemorrhage, hydrocephalus, extra-axial collection or mass lesion/mass effect. Mild for age patchy white matter hypodensities, compatible with chronic microvascular ischemic disease. Vascular: No hyperdense vessel identified. Skull: No acute fracture. Sinuses/Orbits: Mild paranasal sinus mucosal thickening. No acute orbital findings. CT CERVICAL SPINE FINDINGS Alignment: Mild reversal and mild likely degenerative anterolisthesis of C4 on C5. Otherwise, no substantial sagittal subluxation Skull base and vertebrae: Mild superior endplate irregularity and height loss of the T1 and T2 vertebral bodies, age indeterminate. Cervical vertebral body heights are maintained Soft tissues and spinal canal: No prevertebral fluid or swelling. No visible canal hematoma. Disc levels: Multilevel facet uncovertebral hypertrophy with varying degrees of neural foraminal stenosis. Upper chest: Visualized lung apices are clear. Other: Calcific atherosclerosis. CT LUMBAR SPINE FINDINGS Alignment: Similar alignment.  No new sagittal subluxation. Vertebrae: Chronic L1 burst fracture with mild bony retropulsion with similar severe height loss centrally. No progressive height loss. No increase in bony retropulsion. Additional vertebral body heights are similar to the prior. Acute nondisplaced fracture through the left S1 transverse process (for example see series 4, image 98). Acute nondisplaced fracture through the left sacral ala which extensive left SI joint (for example see series 4, image 111). Soft tissues and spinal canal: No prevertebral fluid or swelling. No visible canal hematoma. Disc levels: Severe multifocal degenerative change including facet arthropathy with varying degrees of neural foraminal stenosis. Paraspinal:  Similar versus minimally increased 31 mm infrarenal abdominal aortic aneurysm, partially imaged. Calcific atherosclerosis. IMPRESSION: CT lumbar spine: 1. Acute nondisplaced fracture through the left S1 transverse process. 2. Acute nondisplaced fracture through the left sacral ala which extensive left SI joint. 3. No substantial change in chronic severe L1 burst fracture with mild bony retropulsion and severe central height loss. 4. Similar versus minimally increased 31 mm infrarenal abdominal aortic aneurysm, partially imaged. Recommend follow-up ultrasound every 3 years. This recommendation follows ACR consensus guidelines: White Paper of the ACR Incidental Findings Committee II on Vascular Findings. J Am Coll Radiol 2013JB:[id number]. 5. Aortic Atherosclerosis (ICD10-I70.0). CT thoracic spine: Mild superior endplate irregularity and height loss of the T1 and T2 vertebral bodies, age indeterminate. Recommend correlation with the presence or absence of point tenderness. MRI of the thoracic spine could better assess chronicity if clinically warranted. CT head: No acute intracranial abnormality. Electronically Signed: By: Margaretha Sheffield M.D. On: 10/06/2022 14:51   CT Lumbar Spine Wo Contrast  Addendum Date: 10/06/2022   ADDENDUM REPORT: 10/06/2022 14:57 ADDENDUM: Correction: The left transverse fracture described in the findings and impression as at S1 is actually at L5. Also, in addition to  the left sacral ala fracture described in the report there also is a nondisplaced S2 vertebral body fracture. This was discussed with Sheran Luz via telephone at 2:56 PM. Electronically Signed   By: Margaretha Sheffield M.D.   On: 10/06/2022 14:57   Result Date: 10/06/2022 CLINICAL DATA:  Head trauma, minor (Age >= 65y); Back trauma, no prior imaging (Age >= 16y); Neck trauma (Age >= 65y) EXAM: CT HEAD WITHOUT CONTRAST CT CERVICAL SPINE WITHOUT CONTRAST CT LUMBAR SPINE WITHOUT CONTRAST TECHNIQUE: Multidetector CT imaging of  the head, cervical and lumbar spine was performed following the standard protocol without intravenous contrast. Multiplanar CT image reconstructions of the cervical spine were also generated. RADIATION DOSE REDUCTION: This exam was performed according to the departmental dose-optimization program which includes automated exposure control, adjustment of the mA and/or kV according to patient size and/or use of iterative reconstruction technique. COMPARISON:  None Available. FINDINGS: CT HEAD FINDINGS Brain: No evidence of acute infarction, hemorrhage, hydrocephalus, extra-axial collection or mass lesion/mass effect. Mild for age patchy white matter hypodensities, compatible with chronic microvascular ischemic disease. Vascular: No hyperdense vessel identified. Skull: No acute fracture. Sinuses/Orbits: Mild paranasal sinus mucosal thickening. No acute orbital findings. CT CERVICAL SPINE FINDINGS Alignment: Mild reversal and mild likely degenerative anterolisthesis of C4 on C5. Otherwise, no substantial sagittal subluxation Skull base and vertebrae: Mild superior endplate irregularity and height loss of the T1 and T2 vertebral bodies, age indeterminate. Cervical vertebral body heights are maintained Soft tissues and spinal canal: No prevertebral fluid or swelling. No visible canal hematoma. Disc levels: Multilevel facet uncovertebral hypertrophy with varying degrees of neural foraminal stenosis. Upper chest: Visualized lung apices are clear. Other: Calcific atherosclerosis. CT LUMBAR SPINE FINDINGS Alignment: Similar alignment.  No new sagittal subluxation. Vertebrae: Chronic L1 burst fracture with mild bony retropulsion with similar severe height loss centrally. No progressive height loss. No increase in bony retropulsion. Additional vertebral body heights are similar to the prior. Acute nondisplaced fracture through the left S1 transverse process (for example see series 4, image 98). Acute nondisplaced fracture  through the left sacral ala which extensive left SI joint (for example see series 4, image 111). Soft tissues and spinal canal: No prevertebral fluid or swelling. No visible canal hematoma. Disc levels: Severe multifocal degenerative change including facet arthropathy with varying degrees of neural foraminal stenosis. Paraspinal: Similar versus minimally increased 31 mm infrarenal abdominal aortic aneurysm, partially imaged. Calcific atherosclerosis. IMPRESSION: CT lumbar spine: 1. Acute nondisplaced fracture through the left S1 transverse process. 2. Acute nondisplaced fracture through the left sacral ala which extensive left SI joint. 3. No substantial change in chronic severe L1 burst fracture with mild bony retropulsion and severe central height loss. 4. Similar versus minimally increased 31 mm infrarenal abdominal aortic aneurysm, partially imaged. Recommend follow-up ultrasound every 3 years. This recommendation follows ACR consensus guidelines: White Paper of the ACR Incidental Findings Committee II on Vascular Findings. J Am Coll Radiol [id number]. 5. Aortic Atherosclerosis (ICD10-I70.0). CT thoracic spine: Mild superior endplate irregularity and height loss of the T1 and T2 vertebral bodies, age indeterminate. Recommend correlation with the presence or absence of point tenderness. MRI of the thoracic spine could better assess chronicity if clinically warranted. CT head: No acute intracranial abnormality. Electronically Signed: By: Margaretha Sheffield M.D. On: 10/06/2022 14:51   DG Ribs Unilateral W/Chest Left  Result Date: 10/06/2022 CLINICAL DATA:  LEFT chest pain following fall 3 weeks ago. Initial encounter. EXAM: LEFT RIBS AND CHEST - 3+ VIEW COMPARISON:  01/13/2022 radiograph FINDINGS: This is a low volume study. The cardiomediastinal silhouette is unremarkable. There is no evidence of focal airspace disease, pulmonary edema, suspicious pulmonary nodule/mass, pleural effusion, or pneumothorax.  There are fractures of the anterolateral LEFT 6th through 10th ribs. The 6th and 7th ribs may be subacute to remote. The other fractures are acute. IMPRESSION: Fractures of the anterolateral LEFT 6th through 10th ribs. No pneumothorax or pleural effusion. No acute cardiopulmonary disease. Electronically Signed   By: Margarette Canada M.D.   On: 10/06/2022 13:20    Microbiology: Results for orders placed or performed during the hospital encounter of 10/06/22  Resp panel by RT-PCR (RSV, Flu A&B, Covid) Anterior Nasal Swab     Status: None   Collection Time: 10/06/22 12:31 PM   Specimen: Anterior Nasal Swab  Result Value Ref Range Status   SARS Coronavirus 2 by RT PCR NEGATIVE NEGATIVE Final    Comment: (NOTE) SARS-CoV-2 target nucleic acids are NOT DETECTED.  The SARS-CoV-2 RNA is generally detectable in upper respiratory specimens during the acute phase of infection. The lowest concentration of SARS-CoV-2 viral copies this assay can detect is 138 copies/mL. A negative result does not preclude SARS-Cov-2 infection and should not be used as the sole basis for treatment or other patient management decisions. A negative result may occur with  improper specimen collection/handling, submission of specimen other than nasopharyngeal swab, presence of viral mutation(s) within the areas targeted by this assay, and inadequate number of viral copies(<138 copies/mL). A negative result must be combined with clinical observations, patient history, and epidemiological information. The expected result is Negative.  Fact Sheet for Patients:  EntrepreneurPulse.com.au  Fact Sheet for Healthcare Providers:  IncredibleEmployment.be  This test is no t yet approved or cleared by the [place] FDA and  has been authorized for detection and/or diagnosis of SARS-CoV-2 by FDA under an Emergency Use Authorization (EUA). This EUA will remain  in effect (meaning this test can be  used) for the duration of the COVID-19 declaration under Section 564(b)(1) of the Act, 21 U.S.C.section 360bbb-3(b)(1), unless the authorization is terminated  or revoked sooner.       Influenza A by PCR NEGATIVE NEGATIVE Final   Influenza B by PCR NEGATIVE NEGATIVE Final    Comment: (NOTE) The Xpert Xpress SARS-CoV-2/FLU/RSV plus assay is intended as an aid in the diagnosis of influenza from Nasopharyngeal swab specimens and should not be used as a sole basis for treatment. Nasal washings and aspirates are unacceptable for Xpert Xpress SARS-CoV-2/FLU/RSV testing.  Fact Sheet for Patients: EntrepreneurPulse.com.au  Fact Sheet for Healthcare Providers: IncredibleEmployment.be  This test is not yet approved or cleared by the [place] FDA and has been authorized for detection and/or diagnosis of SARS-CoV-2 by FDA under an Emergency Use Authorization (EUA). This EUA will remain in effect (meaning this test can be used) for the duration of the COVID-19 declaration under Section 564(b)(1) of the Act, 21 U.S.C. section 360bbb-3(b)(1), unless the authorization is terminated or revoked.     Resp Syncytial Virus by PCR NEGATIVE NEGATIVE Final    Comment: (NOTE) Fact Sheet for Patients: EntrepreneurPulse.com.au  Fact Sheet for Healthcare Providers: IncredibleEmployment.be  This test is not yet approved or cleared by the [place] FDA and has been authorized for detection and/or diagnosis of SARS-CoV-2 by FDA under an Emergency Use Authorization (EUA). This EUA will remain in effect (meaning this test can be used) for the duration of the COVID-19 declaration under Section 564(b)(1) of the Act,  21 U.S.C. section 360bbb-3(b)(1), unless the authorization is terminated or revoked.  Performed at [facility], [address] [place], [place] [address]     Labs: CBC: Recent Labs  Lab  10/06/22 1231 10/07/22 0336  WBC 6.2 5.3  NEUTROABS 4.6  --   HGB 12.7* 11.0*  HCT 35.5* 30.5*  MCV 96.7 97.1  PLT 218 0000000   Basic Metabolic Panel: Recent Labs  Lab 10/06/22 1445 10/06/22 1717 10/07/22 0336 10/07/22 1333 10/07/22 2037 10/08/22 0436 10/09/22 0936 10/10/22 0912 10/11/22 0820  NA  --    < > 122* 123* 124* 128* 127* 126* 127*  K  --   --  3.6 3.8  --   --  3.8 3.9 4.0  CL  --   --  91* 91*  --   --  95* 93* 92*  CO2  --   --  21* 26  --   --  25 23 23   GLUCOSE  --   --  112* 114*  --   --  134* 102* 104*  BUN  --   --  10 9  --   --  12 13 14   CREATININE  --   --  0.48* 0.44*  --   --  0.57* 0.55* 0.49*  CALCIUM  --   --  7.7* 8.4*  --   --  8.6* 8.6* 8.9  MG 1.6*  --  1.8  --   --   --   --   --   --   PHOS 3.0  --   --   --   --   --   --   --   --    < > = values in this interval not displayed.   Liver Function Tests: Recent Labs  Lab 10/07/22 0936 10/07/22 1333  AST 49* 49*  ALT 14 6  ALKPHOS 83 91  BILITOT 0.8 0.7  PROT 6.3* 7.9  ALBUMIN 3.2* 3.8   CBG: No results for input(s): "GLUCAP" in the last 168 hours.  Discharge time spent: greater than 30 minutes.  Signed: Collier Bullock, MD [facility] 10/11/2022

## 2022-10-14 ENCOUNTER — Ambulatory Visit: Payer: Medicare Other

## 2022-10-29 ENCOUNTER — Ambulatory Visit: Payer: Medicare Other | Admitting: Neurosurgery

## 2022-11-25 ENCOUNTER — Other Ambulatory Visit: Payer: Self-pay

## 2022-11-25 DIAGNOSIS — S32009D Unspecified fracture of unspecified lumbar vertebra, subsequent encounter for fracture with routine healing: Secondary | ICD-10-CM

## 2022-11-26 ENCOUNTER — Ambulatory Visit
Admission: RE | Admit: 2022-11-26 | Discharge: 2022-11-26 | Disposition: A | Discharge status: Home / Self Care | Payer: Medicare Other | Attending: Neurosurgery | Admitting: Neurosurgery

## 2022-11-26 ENCOUNTER — Encounter: Payer: Self-pay | Admitting: Neurosurgery

## 2022-11-26 ENCOUNTER — Ambulatory Visit
Admission: RE | Admit: 2022-11-26 | Discharge: 2022-11-26 | Disposition: A | Discharge status: Home / Self Care | Payer: Medicare Other | Source: Ambulatory Visit | Attending: Neurosurgery | Admitting: Neurosurgery

## 2022-11-26 ENCOUNTER — Ambulatory Visit (INDEPENDENT_AMBULATORY_CARE_PROVIDER_SITE_OTHER): Payer: Medicare Other | Admitting: Neurosurgery

## 2022-11-26 VITALS — BP 134/84 | Ht 72.0 in | Wt 178.0 lb

## 2022-11-26 DIAGNOSIS — S32058D Other fracture of fifth lumbar vertebra, subsequent encounter for fracture with routine healing: Secondary | ICD-10-CM

## 2022-11-26 DIAGNOSIS — W19XXXD Unspecified fall, subsequent encounter: Secondary | ICD-10-CM

## 2022-11-26 DIAGNOSIS — S32009D Unspecified fracture of unspecified lumbar vertebra, subsequent encounter for fracture with routine healing: Secondary | ICD-10-CM

## 2022-11-26 NOTE — Progress Notes (Signed)
Follow-up note: Referring Physician:  Barbette Reichmann, MD [address] [facility] [place],  [place] [address]  Primary Physician:  Barbette Reichmann, MD  Chief Complaint:  f/u of L5 TP fracture.   History of Present Illness: Nicolas Solis is a 72 y.o. male who presents today for hospital follow-up of L L5 TP fracture.  Today he complains of continued low back pain and pain that radiates down his left leg.  He states that this is started over the last couple of weeks without any particular inciting event.  It is worse with movement.  He states that the pain starts in his buttocks and radiates down the posterior lateral aspect of his left leg to the knee.  He cannot tell if there is symptoms that radiate into the lower portion of his leg.  10/08/22 consult note Nicolas Solis is a 72 y.o presenting with concerns of weakness and imbalance. He reports a fall about 2 weeks ago due to weakness resulting in hitting his head and has felt weak and had difficulty walking since. He states the he does have some moderate left sided low back pain.  He denies any radiating leg pain, numbness or tingling.   Review of Systems:  A 10 point review of systems is negative, and the pertinent positives and negatives detailed in the HPI.  Past Medical History: Past Medical History:  Diagnosis Date   Compression fracture of first cervical vertebra (HCC)    Essential tremor    Ileus (HCC)     Past Surgical History: Past Surgical History:  Procedure Laterality Date   25 GAUGE PARS PLANA VITRECTOMY WITH 20 GAUGE MVR PORT Right 03/18/2017   25 GAUGE PARS PLANA VITRECTOMY WITH 20 GAUGE MVR PORT FOR MACULAR HOLE Right 03/18/2017   Procedure: 25 GAUGE PARS PLANA VITRECTOMY WITH 20 GAUGE MVR PORT FOR MACULAR HOLE; PHOTOCOAGULATION RIGHT EYE;  Surgeon: Sherrie George, MD;  Location: [facility] OR;  Service: Ophthalmology;  Laterality: Right;   APPENDECTOMY     COLONOSCOPY W/ POLYPECTOMY      GAS INSERTION Right 03/18/2017   Procedure: INSERTION OF GAS RIGHT (C3F8);  Surgeon: Sherrie George, MD;  Location: [facility] OR;  Service: Ophthalmology;  Laterality: Right;   GAS/FLUID EXCHANGE Right 03/18/2017   Procedure: GAS/FLUID EXCHANGE RIGHT EYE;  Surgeon: Sherrie George, MD;  Location: [facility] OR;  Service: Ophthalmology;  Laterality: Right;   JOINT REPLACEMENT Right    Hip   LEFT HEART CATH AND CORONARY ANGIOGRAPHY N/A 02/06/2022   Procedure: LEFT HEART CATH AND CORONARY ANGIOGRAPHY;  Surgeon: Iran Ouch, MD;  Location: [facility] LAB;  Service: Cardiovascular;  Laterality: N/A;   MEMBRANE PEEL Right 03/18/2017   Procedure: MEMBRANE PEEL RIGHT EYE;  Surgeon: Sherrie George, MD;  Location: [facility] OR;  Service: Ophthalmology;  Laterality: Right;   MOUTH SURGERY     PHOTOCOAGULATION Left 03/18/2017   Procedure: PHOTOCOAGULATION LEFT EYE;  Surgeon: Sherrie George, MD;  Location: [facility] OR;  Service: Ophthalmology;  Laterality: Left;   SERUM PATCH Right 03/18/2017   Procedure: SERUM PATCH RIGHT EYE;  Surgeon: Sherrie George, MD;  Location: [facility] OR;  Service: Ophthalmology;  Laterality: Right;    Allergies: Allergies as of 11/26/2022   (No Known Allergies)    Medications: Outpatient Encounter Medications as of 11/26/2022  Medication Sig   acetaminophen (TYLENOL) 500 MG tablet Take 1,000 mg by mouth every 6 (six) hours as needed (for pain.).   aspirin EC  81 MG tablet Take 1 tablet (81 mg total) by mouth daily. Swallow whole.   atorvastatin (LIPITOR) 20 MG tablet Take 1 tablet (20 mg total) by mouth daily.   carbidopa-levodopa (SINEMET IR) 25-100 MG tablet Take 2 tablets by mouth 3 (three) times daily before meals.   Carbidopa-Levodopa ER (SINEMET CR) 25-100 MG tablet controlled release Take 1 tablet by mouth at bedtime.   Cholecalciferol (VITAMIN D3) 50 MCG (2000 UT) TABS Take 2,000 Units by mouth in the morning.   Cyanocobalamin (VITAMIN B-12) 5000 MCG LOZG Take 2,500 mcg by mouth every  evening.   dorzolamide-timolol (COSOPT) 22.3-6.8 MG/ML ophthalmic solution Place 1 drop into both eyes daily.   Multiple Vitamins-Minerals (ADULT ONE DAILY GUMMIES PO) Take 2 tablets by mouth every evening.   nitroGLYCERIN (NITROSTAT) 0.4 MG SL tablet Place 1 tablet (0.4 mg total) under the tongue every 5 (five) minutes as needed for chest pain. For a max of 3 doses in 1 day.   Omega-3 Fatty Acids (FISH OIL PO) Take 1,000 mg by mouth in the morning.   polyethylene glycol (MIRALAX / GLYCOLAX) 17 g packet Take 17 g by mouth daily.   ranolazine (RANEXA) 500 MG 12 hr tablet Take 1 tablet (500 mg total) by mouth 2 (two) times daily.   tamsulosin (FLOMAX) 0.4 MG CAPS capsule Take 0.4 mg by mouth daily after supper.   No facility-administered encounter medications on file as of 11/26/2022.    Social History: Social History   Tobacco Use   Smoking status: Former    Years: 50    Types: Cigarettes    Quit date: 10/2015    Years since quitting: 7.0    Passive exposure: Past   Smokeless tobacco: Never  Vaping Use   Vaping Use: Never used  Substance Use Topics   Alcohol use: Yes    Alcohol/week: 7.0 standard drinks of alcohol    Types: 7 Shots of liquor per week   Drug use: No    Family Medical History: Family History  Problem Relation Age of Onset   Stroke Brother     Exam: Today's Vitals   11/26/22 1427  BP: 134/84  Weight: 80.7 kg  Height: 6' (1.829 m)  PainSc: 9   PainLoc: Back   Body mass index is 24.14 kg/m.   General: A&O.  ROM of spine: WNL.  Palpation of spine: non TTP.  5/5 throughout BLE  Imaging: 11/26/22 lumbar xrays Show overall stable alignment.  There is a chronic but stable appearing L1 burst fracture.  TP fracture not well-visualized.  I have personally reviewed the images and agree with the above interpretation.  Assessment and Plan: [name] is a pleasant 72 y.o. male with a known TP fracture and sacral fractures following up with neurosurgery for  his TP fracture.  He does continue to have some low back and pelvic pain and is following with Dr. Jena Gauss for sacral fractures.  He has had a couple weeks of radiating leg pain.  This does make the question as to whether or not he is developing lumbar radiculopathy.  I have sent a message to Dr. Jena Gauss to inquire if his symptoms could be related to his sacral fractures.  If this is the case I will defer further workup or treatment to him.  If he does not feel that his symptoms could be related to his sacral fractures, I will consider ordering a lumbar MRI for further evaluation of lumbar radiculopathy versus CT myelogram given the patient's  history of Parkinson's.  He was encouraged to call our office should he have any questions or concerns.  I spent a total of 30 minutes in both face-to-face and non-face-to-face activities for this visit on the date of this encounter including review of records, review of imaging, discussion of symptoms, physical exam, discussion of treatment options, and documentation.  Manning Charity PA-C Neurosurgery

## 2023-02-23 IMAGING — US US AORTA
1 series · 14 of 25 positions shown · non-contrast
Comparison: None.

CLINICAL DATA: Atherosclerosis

EXAM:
ULTRASOUND OF ABDOMINAL AORTA
TECHNIQUE: Ultrasound examination of the abdominal aorta and proximal common
iliac arteries was performed to evaluate for aneurysm. Additional
color and Doppler images of the distal aorta were obtained to
document patency.

[Series 1: us aorta duplex limited · 14 of 32 slices shown]
[im 1/32]
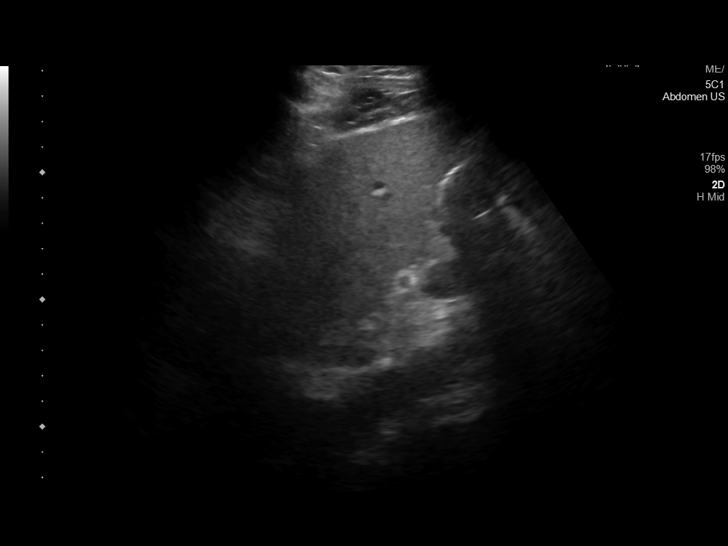
[im 3/32]
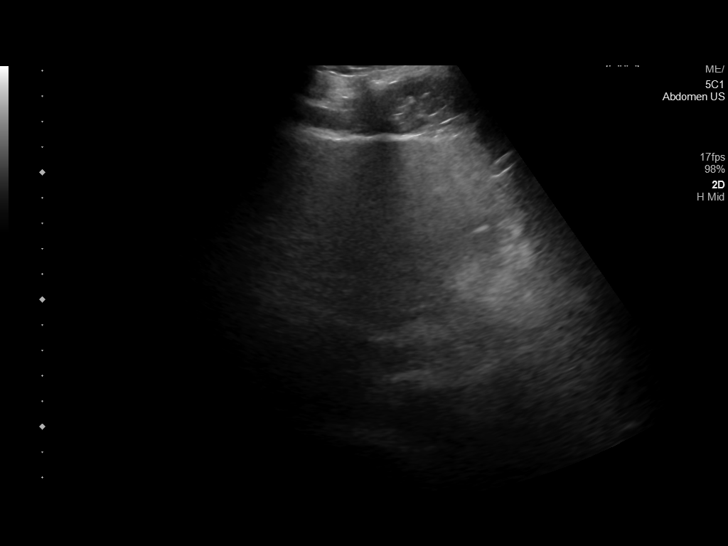
[im 6/32]
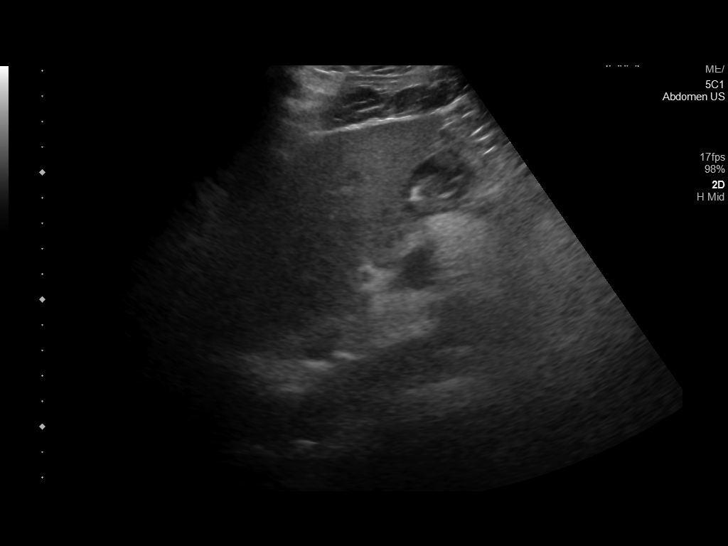
[im 8/32]
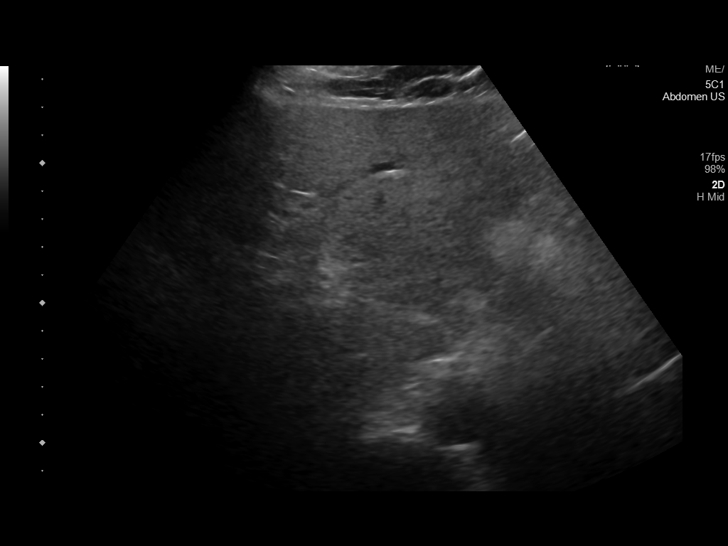
[im 11/32]
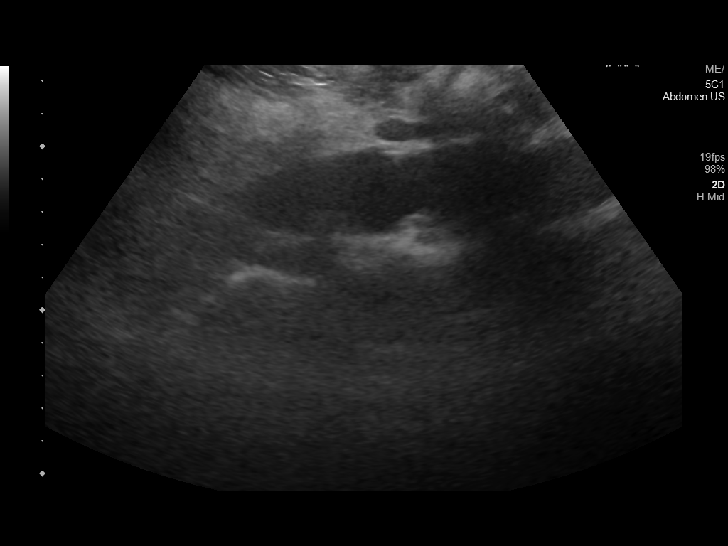
[im 12/32]
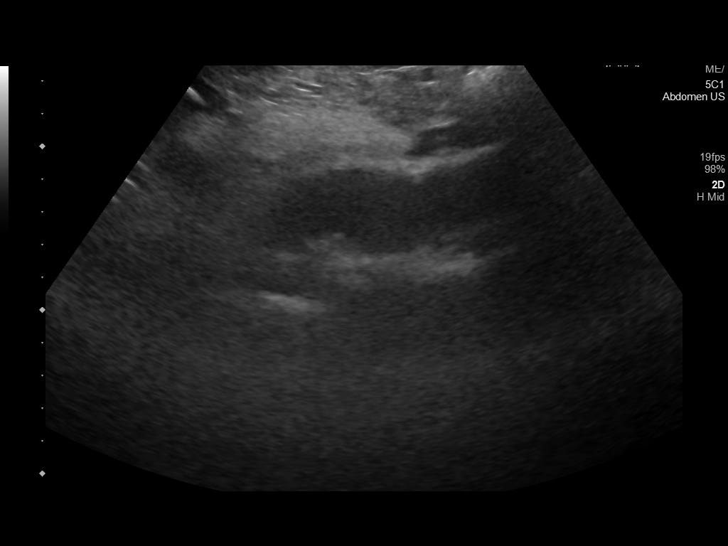
[im 15/32]
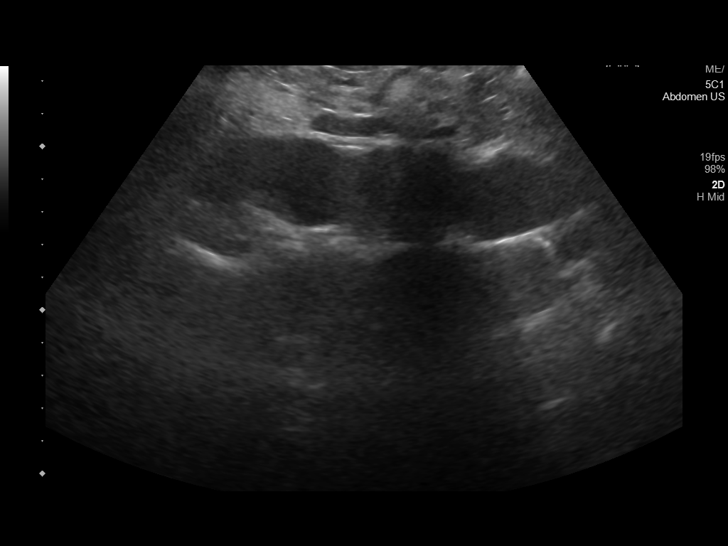
[im 17/32]
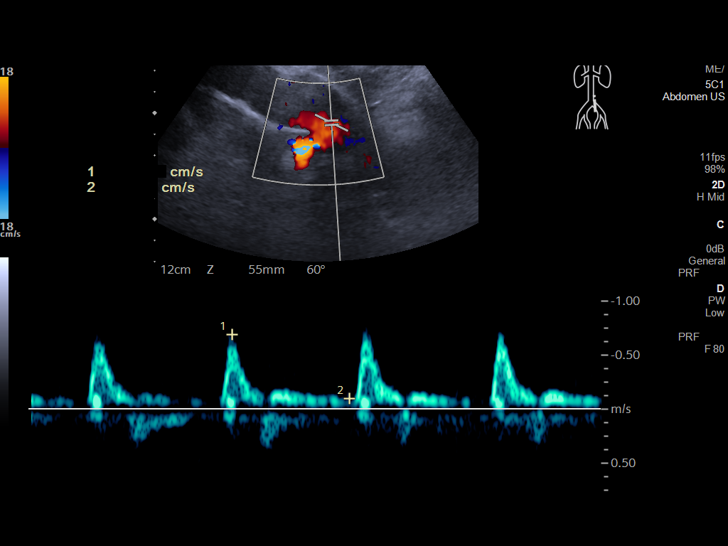
[im 20/32]
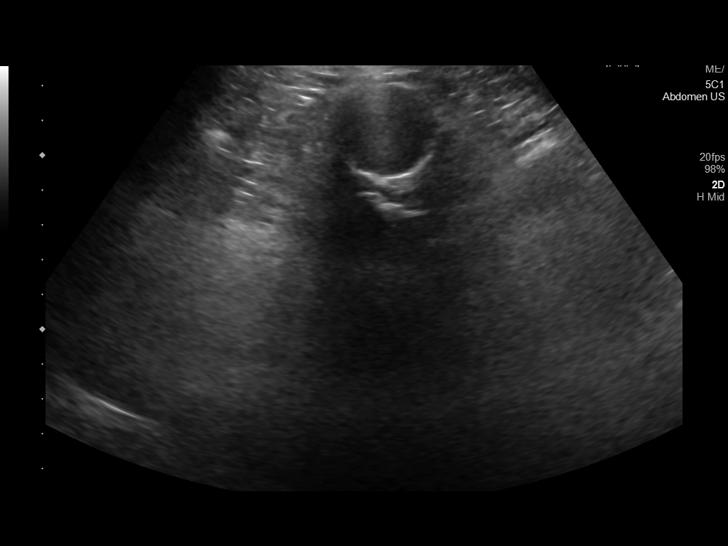
[im 21/32]
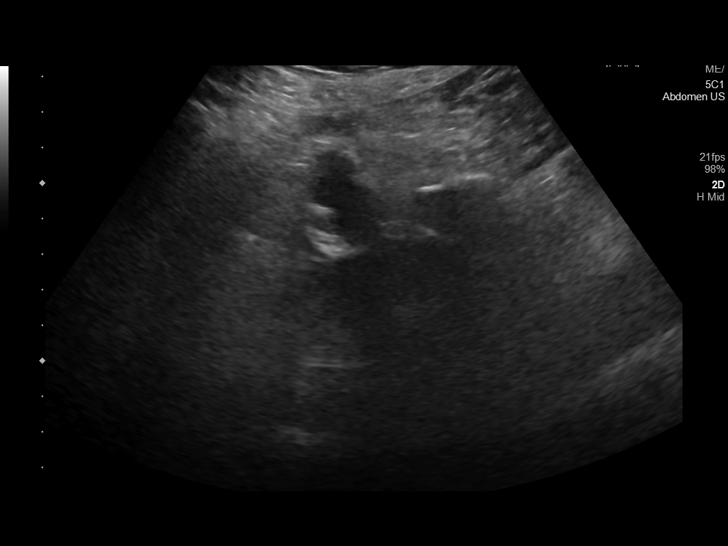
[im 24/32]
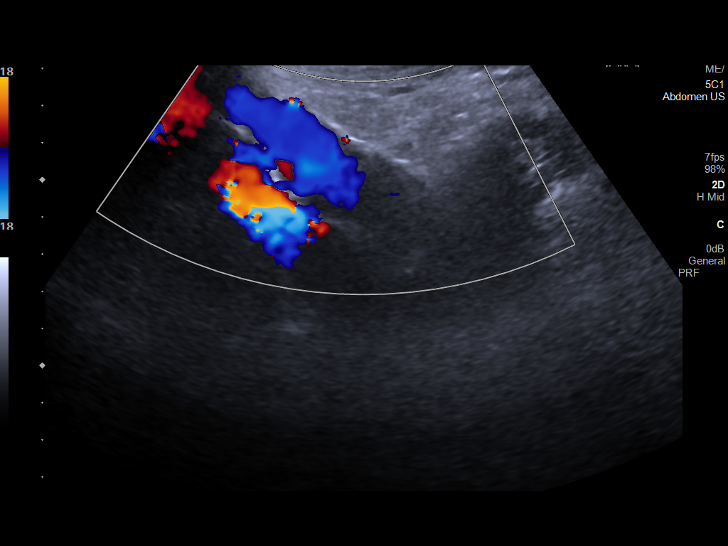
[im 26/32]
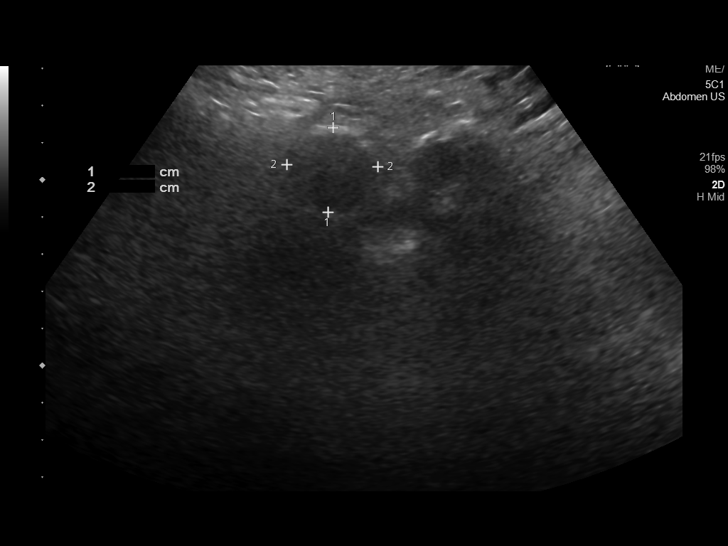
[im 29/32]
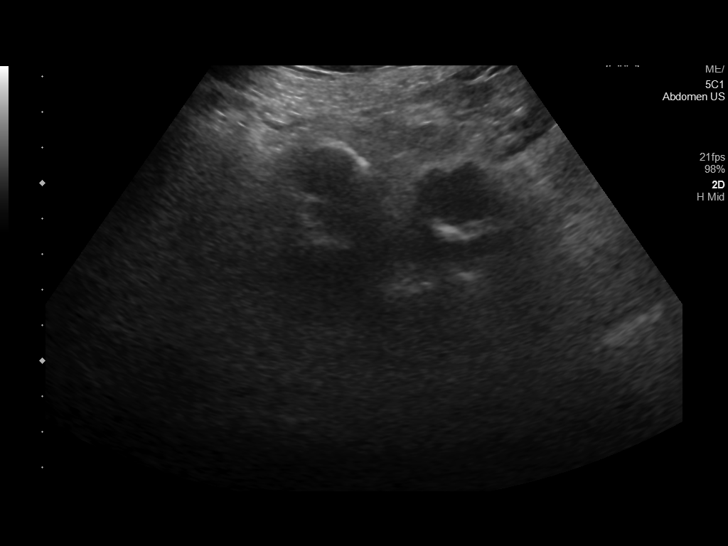
[im 32/32]
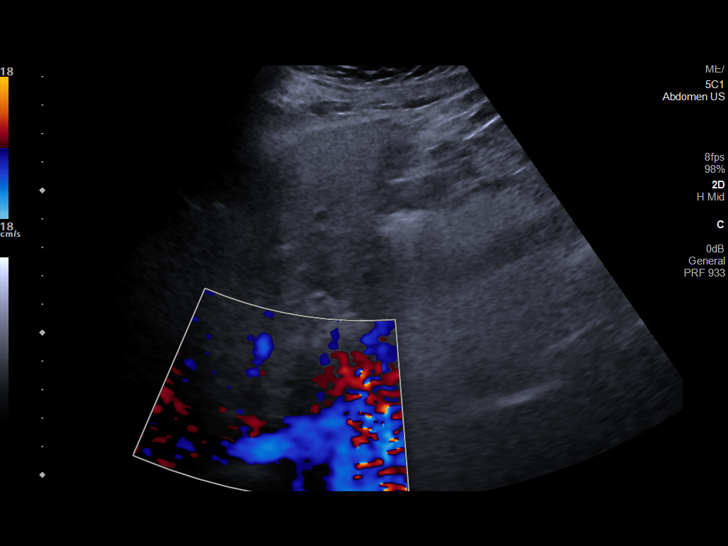

[14 of 25 positions shown; findings below may reference images not displayed]

FINDINGS: Abdominal aortic measurements as follows:

Proximal: 2.4 cm AP x 2.5 cm transverse

Mid: 2 cm AP x 2.1 cm transverse

Distal: 3.5 cm AP by 3.4 cm transverse
Patent: Yes, peak systolic velocity is 69.2 cm/s

Right common iliac artery: 2.5 cm

Left common iliac artery: 2.3 cm
IMPRESSION: 1. Atherosclerosis with distal aortic aneurysm up to 3.5 cm with
mild aneurysmal dilatation of iliac arteries. Recommend follow-up
ultrasound every 2 years. This recommendation follows ACR consensus
guidelines: White Paper of the ACR Incidental Findings Committee II

## 2023-07-21 ENCOUNTER — Other Ambulatory Visit: Payer: Self-pay

## 2023-07-21 MED ORDER — ATORVASTATIN CALCIUM 20 MG PO TABS: 20.0000 mg | ORAL_TABLET | Freq: Every day | ORAL | 0 refills | Status: DC

## 2023-07-21 NOTE — Telephone Encounter (Signed)
Requested Prescriptions   Signed Prescriptions Disp Refills   atorvastatin (LIPITOR) 20 MG tablet 90 tablet 0    Sig: Take 1 tablet (20 mg total) by mouth daily. PLEASE CALL OFFICE TO SCHEDULE APPOINTMENT PRIOR TO NEXT REFILL    Authorizing Provider: Debbe Odea    Ordering User: Feliberto Harts L    last office visit: 09/26/22 plan to f/u in 12 months  next visit:  None/active recall

## 2023-07-31 ENCOUNTER — Ambulatory Visit: Payer: Medicare Other | Attending: Neurology | Admitting: Speech Pathology

## 2023-07-31 DIAGNOSIS — R41841 Cognitive communication deficit: Secondary | ICD-10-CM | POA: Insufficient documentation

## 2023-07-31 NOTE — Therapy (Signed)
 OUTPATIENT SPEECH LANGUAGE PATHOLOGY  COGNITION EVALUATION ONLY   Patient Name: Nicolas Solis MRN: [id number] DOB:[birth year], 73 y.o., male Today's Date: 07/31/2023  PCP: Tamra Leventhal, MD REFERRING PROVIDER: Jannett Fairly, MD   End of Session - 07/31/23 1134     Visit Number 1    Number of Visits 1    SLP Start Time 1015    SLP Stop Time  1115    SLP Time Calculation (min) 60 min    Activity Tolerance Patient tolerated treatment well             Past Medical History:  Diagnosis Date   Compression fracture of first cervical vertebra (HCC)    Essential tremor    Ileus (HCC)    Past Surgical History:  Procedure Laterality Date   25 GAUGE PARS PLANA VITRECTOMY WITH 20 GAUGE MVR PORT Right 03/18/2017   25 GAUGE PARS PLANA VITRECTOMY WITH 20 GAUGE MVR PORT FOR MACULAR HOLE Right 03/18/2017   Procedure: 25 GAUGE PARS PLANA VITRECTOMY WITH 20 GAUGE MVR PORT FOR MACULAR HOLE; PHOTOCOAGULATION RIGHT EYE;  Surgeon: Alvia Norleen BIRCH, MD;  Location: [facility] OR;  Service: Ophthalmology;  Laterality: Right;   APPENDECTOMY     COLONOSCOPY W/ POLYPECTOMY     GAS INSERTION Right 03/18/2017   Procedure: INSERTION OF GAS RIGHT (C3F8);  Surgeon: Alvia Norleen BIRCH, MD;  Location: [facility] OR;  Service: Ophthalmology;  Laterality: Right;   GAS/FLUID EXCHANGE Right 03/18/2017   Procedure: GAS/FLUID EXCHANGE RIGHT EYE;  Surgeon: Alvia Norleen BIRCH, MD;  Location: [facility] OR;  Service: Ophthalmology;  Laterality: Right;   JOINT REPLACEMENT Right    Hip   LEFT HEART CATH AND CORONARY ANGIOGRAPHY N/A 02/06/2022   Procedure: LEFT HEART CATH AND CORONARY ANGIOGRAPHY;  Surgeon: Darron Deatrice LABOR, MD;  Location: [facility] LAB;  Service: Cardiovascular;  Laterality: N/A;   MEMBRANE PEEL Right 03/18/2017   Procedure: MEMBRANE PEEL RIGHT EYE;  Surgeon: Alvia Norleen BIRCH, MD;  Location: [facility] OR;  Service: Ophthalmology;  Laterality: Right;   MOUTH SURGERY     PHOTOCOAGULATION Left 03/18/2017   Procedure:  PHOTOCOAGULATION LEFT EYE;  Surgeon: Alvia Norleen BIRCH, MD;  Location: [facility] OR;  Service: Ophthalmology;  Laterality: Left;   SERUM PATCH Right 03/18/2017   Procedure: SERUM PATCH RIGHT EYE;  Surgeon: Alvia Norleen BIRCH, MD;  Location: [facility] OR;  Service: Ophthalmology;  Laterality: Right;   Patient Active Problem List   Diagnosis Date Noted   Closed fracture of transverse process of lumbar vertebra (HCC) 10/08/2022   Hyponatremia 10/06/2022   Parkinson's disease (HCC) 10/06/2022   Hyperlipidemia 10/06/2022   Frequent falls 10/06/2022   Hypomagnesemia 10/06/2022   Alcohol abuse 10/06/2022   Left rib fracture 10/06/2022   L1 vertebral fracture (HCC) 10/06/2022   Coronary artery disease    Macular hole, right eye 03/18/2017    ONSET DATE: 07/09/2023 date of referral    REFERRING DIAG: G20.C (ICD-10-CM) - Parkinsonism, unspecified   THERAPY DIAG:  Cognitive communication deficit  Rationale for Evaluation and Treatment Rehabilitation  SUBJECTIVE:   SUBJECTIVE STATEMENT: Pt apathic Pt accompanied by: significant other  PERTINENT HISTORY: Pt is a 73 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 He attends [employer] once per week, resides at home with his wife.   DIAGNOSTIC FINDINGS:   09/2022 - CT Cervical Spine (C-Spine) - Mild reversal and mild likely degenerative  anterolisthesis of C4 on C5. Otherwise, no substantial sagittal  subluxation   - CT Head (Brain) - No acute intracranial abnormality.   - Chest Xray, AP - small left pleural effusion (fluid in the lungs)  (bruising and tenderness to palpation along 9th, 10th, 11th, 12th rib spaces, various stages of healing, no crepitus noted)   - Xray, Left Hip - not completed    PAIN:  Are you having pain? No   FALLS: Has patient fallen in last 6 months?  No  LIVING ENVIRONMENT: Lives with:  lives with their spouse Lives in: House/apartment  PLOF:  Level of assistance: Needed assistance with ADLs, Needed assistance with IADLS Employment: Retired   PATIENT GOALS   pt unable to identify any areas of needed improvement  OBJECTIVE:   COGNITIVE COMMUNICATION Overall cognitive status: Within functional limits for tasks assessed Areas of impairment:  Behavior: apathic Functional Impairments: see impressions statement below  AUDITORY COMPREHENSION  Overall auditory comprehension: Appears intact YES/NO questions: Appears intact Following directions: Appears intact Conversation: Simple  READING COMPREHENSION: Intact  EXPRESSION: verbal  VERBAL EXPRESSION:   Overall verbal expression: Appears intact Level of generative/spontaneous verbalization: conversation Automatic speech: name: intact, social response: intact, day of week: intact, and month of year: intact  Repetition: Appears intact Pragmatics: Impaired: abnormal effect, eye contact, interpretation of nonverbal communication, topic appropriateness, and topic maintenance Interfering components: premorbid deficit Non-verbal means of communication: N/A  WRITTEN EXPRESSION: Dominant hand: right Written expression: Not tested  ORAL MOTOR EXAMINATION Facial : WFL Lingual: WFL Velum: WFL Mandible: WFL Cough: WFL Voice: WFL  MOTOR SPEECH: Overall motor speech:  at baseline Respiration: diaphragmatic/abdominal breathing Phonation: low vocal intensity Resonance: WFL Articulation: Appears intact Intelligibility: Intelligible Motor planning: Appears intact Effective technique: increased vocal intensity   STANDARDIZED ASSESSMENTS: Addenbrooke's Cognitive Examination - ACE III The Addenbrooke's Cognitive Examination-III (ACE-III) is a brief cognitive test that assesses five cognitive domains. The total score is 100 with higher scores indicating better cognitive functioning. Cut off scores of 88 and 82 are  recommended for suspicion of dementia (88 has sensitivity of 1.00 and specificity of 0.96, 82 has sensitivity of 0.93 and specificity of 1.00). American Version A  Attention 18/18  Memory 18/26  Fluency 10/14  Language 26/26  Visuospatial 12/16  TOTAL ACE- III Score 84/100      PATIENT EDUCATION: Education details: results of this evaluation, possibility of behavioral component - see impressions statement Person educated: Patient and Spouse Education method: Explanation Education comprehension: verbalized understanding     ASSESSMENT:  CLINICAL IMPRESSION: Patient is a 73 y.o. male who was seen today for a cognitive communication evaluation d/t memory concerns related to Parkinson's Disease. Of note, pt with course of LSVT LOUD (12/2020). He was reliant on cues to improve phonation d/t his apathy.   During today's evaluation, pt presented with cognitive abilities that were largely within normal limits on the ACE3. However his wife reports that pt is dependent on her to create a to do list such as showering, emptying the dishwasher, washing dishes. He is also reliant on her to complete tasks as he leaves them undone to watch TV. She also reports that she gives him less and less to do everyday because he doesn't respond favorably to reminders to complete tasks and she also voices increased caregiver burden. They confirm continued alcohol consumption.   Throughout his wive's description and the testing, pt appeared apathic to her concerns. During additional interviewing pt's wife describes baseline personality characteristic of apathy  with difficulty holding down a job d/t being too quick too fast, constantly looking for the angle. More specifically, when pt discusses leaving dishes in the sink, he states maybe it will get done, maybe someone else will do it. While he admits that she wife is the someone else he doesn't voice any motivation to reduce her burden by completing on his  own.   In the absence of cognitive deficits, suspect pt's functional deficits might be related to his apathy and could possibly be more behavioral rather than memory related. Recommend possible referral to psychiatry, neuropsychology.   Pt and his wife also asked questions related to driving. Would recommend completion of driving assessment prior to resumption of driving. List of locations provided to pt and his wife.    REHAB POTENTIAL: Poor concern for behavioral component, baseline apathy, alcohol consumption  PLAN:      Recommend referral to psychology  Abdel Effinger B. Rubbie, M.S., CCC-SLP, Tree Surgeon Certified Brain Injury Specialist [facility]  [facility] Office [phone] Ascom [phone] Fax [phone]

## 2023-08-04 ENCOUNTER — Ambulatory Visit: Payer: Medicare Other | Admitting: Speech Pathology

## 2023-08-05 ENCOUNTER — Encounter: Payer: Medicare Other | Admitting: Speech Pathology

## 2023-08-07 ENCOUNTER — Ambulatory Visit: Payer: Medicare Other | Admitting: Speech Pathology

## 2023-08-12 ENCOUNTER — Encounter: Payer: Medicare Other | Admitting: Speech Pathology

## 2023-08-14 ENCOUNTER — Encounter: Payer: Medicare Other | Admitting: Speech Pathology

## 2023-08-19 ENCOUNTER — Encounter: Payer: Medicare Other | Admitting: Speech Pathology

## 2023-08-21 ENCOUNTER — Encounter: Payer: Medicare Other | Admitting: Speech Pathology

## 2023-08-26 ENCOUNTER — Encounter: Payer: Medicare Other | Admitting: Speech Pathology

## 2023-08-29 ENCOUNTER — Encounter: Payer: Medicare Other | Admitting: Speech Pathology

## 2023-09-02 ENCOUNTER — Encounter: Payer: Medicare Other | Admitting: Speech Pathology

## 2023-09-04 ENCOUNTER — Encounter: Payer: Medicare Other | Admitting: Speech Pathology

## 2023-09-09 ENCOUNTER — Encounter: Payer: Medicare Other | Admitting: Speech Pathology

## 2023-09-11 ENCOUNTER — Encounter: Payer: Medicare Other | Admitting: Speech Pathology

## 2023-09-16 ENCOUNTER — Encounter: Payer: Medicare Other | Admitting: Speech Pathology

## 2023-09-18 ENCOUNTER — Encounter: Payer: Medicare Other | Admitting: Speech Pathology

## 2023-09-23 ENCOUNTER — Encounter: Payer: Medicare Other | Admitting: Speech Pathology

## 2023-10-15 ENCOUNTER — Other Ambulatory Visit: Payer: Self-pay

## 2023-10-15 MED ORDER — ATORVASTATIN CALCIUM 20 MG PO TABS: 20.0000 mg | ORAL_TABLET | Freq: Every day | ORAL | 0 refills | Status: DC

## 2023-11-24 ENCOUNTER — Encounter: Payer: Self-pay | Admitting: Cardiology

## 2023-11-24 ENCOUNTER — Ambulatory Visit: Attending: Cardiology | Admitting: Cardiology

## 2023-11-24 VITALS — BP 104/65 | HR 74 | Ht 71.0 in | Wt 168.8 lb

## 2023-11-24 DIAGNOSIS — I2510 Atherosclerotic heart disease of native coronary artery without angina pectoris: Secondary | ICD-10-CM | POA: Diagnosis present

## 2023-11-24 DIAGNOSIS — R42 Dizziness and giddiness: Secondary | ICD-10-CM | POA: Diagnosis not present

## 2023-11-24 DIAGNOSIS — E785 Hyperlipidemia, unspecified: Secondary | ICD-10-CM | POA: Diagnosis not present

## 2023-11-24 MED ORDER — MIDODRINE HCL 2.5 MG PO TABS: 2.5000 mg | ORAL_TABLET | Freq: Three times a day (TID) | ORAL | 3 refills | Status: DC

## 2023-11-24 NOTE — Progress Notes (Signed)
 Cardiology Office Note:    Date:  11/24/2023   ID:  Nicolas Solis, DOB March 30, 1951, MRN [id number]  PCP:  Antonio Baumgarten, MD   [facility] Providers Cardiologist:  Constancia Delton, MD     Referring MD: Antonio Baumgarten, MD   Chief Complaint  Patient presents with   Follow-up    12 month follow up visit. Patient is doing well on today. Meds reviewed.     History of Present Illness:    Nicolas Solis is a 73 y.o. male with a hx of CAD (CTO RCA, mild to moderate LAD, left circumflex ), Parkinson's disease, former smoker x50+ years presents for follow-up.   Patient complains of on and off dizziness.  Recent BP with neurologist noted to be low with values of 95/57.  Midodrine was being considered.  Had a fall while riding his bike about 2 weeks ago causing setback with his exercise capacity.  No longer able to do much of his exercises which he did prior to falling.  He denies chest pain or shortness of breath.  Compliant with cardiac meds aspirin , Lipitor, Ranexa  as prescribed.   Prior notes/studies Echocardiogram 01/2022 EF 70 to 75% Left heart cath 01/2022 CTO RCA, mild to moderate LAD and LCx    Past Medical History:  Diagnosis Date   Compression fracture of first cervical vertebra (HCC)    Essential tremor    Ileus (HCC)     Past Surgical History:  Procedure Laterality Date   25 GAUGE PARS PLANA VITRECTOMY WITH 20 GAUGE MVR PORT Right 03/18/2017   25 GAUGE PARS PLANA VITRECTOMY WITH 20 GAUGE MVR PORT FOR MACULAR HOLE Right 03/18/2017   Procedure: 25 GAUGE PARS PLANA VITRECTOMY WITH 20 GAUGE MVR PORT FOR MACULAR HOLE; PHOTOCOAGULATION RIGHT EYE;  Surgeon: Rexene Catching, MD;  Location: [facility] OR;  Service: Ophthalmology;  Laterality: Right;   APPENDECTOMY     COLONOSCOPY W/ POLYPECTOMY     GAS INSERTION Right 03/18/2017   Procedure: INSERTION OF GAS RIGHT (C3F8);  Surgeon: Rexene Catching, MD;  Location: [facility] OR;  Service: Ophthalmology;  Laterality: Right;    GAS/FLUID EXCHANGE Right 03/18/2017   Procedure: GAS/FLUID EXCHANGE RIGHT EYE;  Surgeon: Rexene Catching, MD;  Location: [facility] OR;  Service: Ophthalmology;  Laterality: Right;   JOINT REPLACEMENT Right    Hip   LEFT HEART CATH AND CORONARY ANGIOGRAPHY N/A 02/06/2022   Procedure: LEFT HEART CATH AND CORONARY ANGIOGRAPHY;  Surgeon: Wenona Hamilton, MD;  [facility]: [facility] INV[facility] LAB;  Service: Cardiovascular;  Laterality: N/A;   MEMBRANE PEEL Right 03/18/2017   Procedure: MEMBRANE PEEL RIGHT EYE;  Surgeon: Rexene Catching, MD;  Location: [facility] OR;  Service: Ophthalmology;  Laterality: Right;   MOUTH SURGERY     PHOTOCOAGULATION Left 03/18/2017   Procedure: PHOTOCOAGULATION LEFT EYE;  Surgeon: Rexene Catching, MD;  Location: [facility] OR;  Service: Ophthalmology;  Laterality: Left;   SERUM PATCH Right 03/18/2017   Procedure: SERUM PATCH RIGHT EYE;  Surgeon: Rexene Catching, MD;  Location: [facility] OR;  Service: Ophthalmology;  Laterality: Right;    Current Medications: Current Meds  Medication Sig   acetaminophen  (TYLENOL ) 500 MG tablet Take 1,000 mg by mouth every 6 (six) hours as needed (for pain.).   aspirin  EC 81 MG tablet Take 1 tablet (81 mg total) by mouth daily. Swallow whole.   atorvastatin  (LIPITOR) 20 MG tablet Take 1 tablet (20 mg total) by mouth daily.   carbidopa -levodopa  (SINEMET  IR) 25-100  MG tablet Take 2 tablets by mouth 3 (three) times daily before meals.   Carbidopa -Levodopa  ER (SINEMET  CR) 25-100 MG tablet controlled release Take 1 tablet by mouth at bedtime.   Cholecalciferol  (VITAMIN D3) 50 MCG (2000 UT) TABS Take 2,000 Units by mouth in the morning.   Cyanocobalamin  (VITAMIN B-12) 5000 MCG LOZG Take 2,500 mcg by mouth every evening.   dorzolamide -timolol  (COSOPT ) 22.3-6.8 MG/ML ophthalmic solution Place 1 drop into both eyes daily.   midodrine (PROAMATINE) 2.5 MG tablet Take 1 tablet (2.5 mg total) by mouth 3 (three) times daily with meals.   Multiple Vitamins-Minerals (ADULT ONE DAILY  GUMMIES PO) Take 2 tablets by mouth every evening.   nitroGLYCERIN  (NITROSTAT ) 0.4 MG SL tablet Place 1 tablet (0.4 mg total) under the tongue every 5 (five) minutes as needed for chest pain. For a max of 3 doses in 1 day.   Omega-3 Fatty Acids (FISH OIL PO) Take 1,000 mg by mouth in the morning.   polyethylene glycol (MIRALAX  / GLYCOLAX ) 17 g packet Take 17 g by mouth daily.   ranolazine  (RANEXA ) 500 MG 12 hr tablet Take 1 tablet (500 mg total) by mouth 2 (two) times daily.   tamsulosin  (FLOMAX ) 0.4 MG CAPS capsule Take 0.4 mg by mouth daily after supper.     Allergies:   Patient has no known allergies.   Social History   Socioeconomic History   Marital status: Married    Spouse name: Not on file   Number of children: Not on file   Years of education: Not on file   Highest education level: Not on file  Occupational History   Not on file  Tobacco Use   Smoking status: Former    Current packs/day: 0.00    Types: Cigarettes    Start date: 10/1965    Quit date: 10/2015    Years since quitting: 8.0    Passive exposure: Past   Smokeless tobacco: Never  Vaping Use   Vaping status: Never Used  Substance and Sexual Activity   Alcohol use: Yes    Alcohol/week: 7.0 standard drinks of alcohol    Types: 7 Shots of liquor per week   Drug use: No   Sexual activity: Not Currently  Other Topics Concern   Not on file  Social History Narrative   Not on file   Social Drivers of Health   Financial Resource Strain: Not on file  Food Insecurity: No Food Insecurity (10/06/2022)   Hunger Vital Sign    Worried About Running Out of Food in the Last Year: Never true    Ran Out of Food in the Last Year: Never true  Transportation Needs: No Transportation Needs (10/06/2022)   PRAPARE - Administrator, Civil Service (Medical): No    Lack of Transportation (Non-Medical): No  Physical Activity: Not on file  Stress: Not on file  Social Connections: Not on file     Family  History: The patient's family history includes Stroke in his brother.  ROS:   Please see the history of present illness.     All other systems reviewed and are negative.  EKGs/Labs/Other Studies Reviewed:    The following studies were reviewed today:   Recent Labs: No results found for requested labs within last 365 days.  Recent Lipid Panel    Component Value Date/Time   TRIG 78 10/06/2022 1552    Outside lipid panel 02/2021 total cholesterol 154, triglyceride 99, LDL 88, HDL 46  Lipid  panel 11/2022 total cholesterol 136, triglycerides 197, LDL 49, HDL 47  Risk Assessment/Calculations:         Physical Exam:    VS:  BP 104/65   Pulse 74   Ht 5\' 11"  (1.803 m)   Wt 168 lb 12.8 oz (76.6 kg)   SpO2 97%   BMI 23.54 kg/m     Wt Readings from Last 3 Encounters:  11/24/23 168 lb 12.8 oz (76.6 kg)  11/26/22 178 lb (80.7 kg)  10/06/22 178 lb 5.6 oz (80.9 kg)     GEN:  Well nourished, well developed in no acute distress HEENT: Normal NECK: No JVD; No carotid bruits CARDIAC: RRR, no murmurs, rubs, gallops RESPIRATORY: Decreased breath sounds bilaterally, no wheezing ABDOMEN: Soft, non-tender, non-distended MUSCULOSKELETAL:  No edema; No deformity  SKIN: Warm and dry NEUROLOGIC:  Alert and oriented x 3 PSYCHIATRIC:  Normal affect   ASSESSMENT:    1. Dizziness   2. Coronary artery disease involving native heart, unspecified vessel or lesion type, unspecified whether angina present   3. Hyperlipidemia LDL goal <70    PLAN:    In order of problems listed above:  Dizziness, hypotension.  Start midodrine 2.5 mg 3 times daily.  Monitor BP, monitor for symptom improvement.  Titrate midodrine as needed.  Safety precautions while exercising/walking advised.   CAD, left heart cath 01/2022 showed CTO proximal RCA, mild to moderate proximal LAD and left circumflex disease. Echo with EF 70%.  Denies chest pain. Continue aspirin  81 mg, Lipitor 20 mg, Ranexa . Hyperlipidemia, LDL  at goal, continue Lipitor.  Follow-up in 3 months     Medication Adjustments/Labs and Tests Ordered: Current medicines are reviewed at length with the patient today.  Concerns regarding medicines are outlined above.  Orders Placed This Encounter  Procedures   EKG 12-Lead   Meds ordered this encounter  Medications   midodrine (PROAMATINE) 2.5 MG tablet    Sig: Take 1 tablet (2.5 mg total) by mouth 3 (three) times daily with meals.    Dispense:  120 tablet    Refill:  3    Patient Instructions  Medication Instructions:  Your physician recommends the following medication changes.  START TAKING: Midodrine 2.5 mg three times a day with meals  *If you need a refill on your cardiac medications before your next appointment, please call your pharmacy*  Lab Work: No labs ordered today    Testing/Procedures: No test ordered today   Follow-Up: At [facility], you and your health needs are our priority.  As part of our continuing mission to provide you with exceptional heart care, our providers are all part of one team.  This team includes your primary Cardiologist (physician) and Advanced Practice Providers or APPs (Physician Assistants and Nurse Practitioners) who all work together to provide you with the care you need, when you need it.  Your next appointment:   3 month(s)  Provider:   You may see Constancia Delton, MD or one of the following Advanced Practice Providers on your designated Care Team:   Laneta Pintos, NP Gildardo Labrador, PA-C Varney Gentleman, PA-C Cadence [place], PA-C Ronald Cockayne, NP Morey Ar, NP    We recommend signing up for the patient portal called "MyChart".  Sign up information is provided on this After Visit Summary.  MyChart is used to connect with patients for Virtual Visits (Telemedicine).  Patients are able to view lab/test results, encounter notes, upcoming appointments, etc.  Non-urgent messages can be sent to your  provider as well.    To learn more about what you can do with MyChart, go to [email or web address].          Signed, Constancia Delton, MD  11/24/2023 12:37 PM     [facility]

## 2023-11-24 NOTE — Patient Instructions (Signed)
 Medication Instructions:  Your physician recommends the following medication changes.  START TAKING: Midodrine 2.5 mg three times a day with meals  *If you need a refill on your cardiac medications before your next appointment, please call your pharmacy*  Lab Work: No labs ordered today    Testing/Procedures: No test ordered today   Follow-Up: At [facility], you and your health needs are our priority.  As part of our continuing mission to provide you with exceptional heart care, our providers are all part of one team.  This team includes your primary Cardiologist (physician) and Advanced Practice Providers or APPs (Physician Assistants and Nurse Practitioners) who all work together to provide you with the care you need, when you need it.  Your next appointment:   3 month(s)  Provider:   You may see Constancia Delton, MD or one of the following Advanced Practice Providers on your designated Care Team:   Laneta Pintos, NP Gildardo Labrador, PA-C Varney Gentleman, PA-C Cadence [place], PA-C Ronald Cockayne, NP Morey Ar, NP    We recommend signing up for the patient portal called "MyChart".  Sign up information is provided on this After Visit Summary.  MyChart is used to connect with patients for Virtual Visits (Telemedicine).  Patients are able to view lab/test results, encounter notes, upcoming appointments, etc.  Non-urgent messages can be sent to your provider as well.   To learn more about what you can do with MyChart, go to [email or web address].

## 2023-12-18 ENCOUNTER — Other Ambulatory Visit: Payer: Self-pay | Admitting: Internal Medicine

## 2023-12-18 DIAGNOSIS — F1090 Alcohol use, unspecified, uncomplicated: Secondary | ICD-10-CM

## 2023-12-18 DIAGNOSIS — D61818 Other pancytopenia: Secondary | ICD-10-CM

## 2023-12-23 ENCOUNTER — Other Ambulatory Visit: Payer: Self-pay | Admitting: Internal Medicine

## 2023-12-23 ENCOUNTER — Ambulatory Visit
Admission: RE | Admit: 2023-12-23 | Discharge: 2023-12-23 | Disposition: A | Discharge status: Home / Self Care | Source: Ambulatory Visit | Attending: Internal Medicine | Admitting: Internal Medicine

## 2023-12-23 DIAGNOSIS — D61818 Other pancytopenia: Secondary | ICD-10-CM | POA: Diagnosis present

## 2023-12-23 DIAGNOSIS — F1090 Alcohol use, unspecified, uncomplicated: Secondary | ICD-10-CM | POA: Diagnosis present

## 2024-01-15 ENCOUNTER — Other Ambulatory Visit: Payer: Self-pay | Admitting: Cardiology

## 2024-02-27 ENCOUNTER — Encounter: Payer: Self-pay | Admitting: Cardiology

## 2024-02-27 ENCOUNTER — Ambulatory Visit: Attending: Cardiology | Admitting: Cardiology

## 2024-02-27 VITALS — BP 110/62 | HR 63 | Ht 72.0 in | Wt 167.0 lb

## 2024-02-27 DIAGNOSIS — E785 Hyperlipidemia, unspecified: Secondary | ICD-10-CM | POA: Insufficient documentation

## 2024-02-27 DIAGNOSIS — R42 Dizziness and giddiness: Secondary | ICD-10-CM | POA: Diagnosis not present

## 2024-02-27 DIAGNOSIS — I2510 Atherosclerotic heart disease of native coronary artery without angina pectoris: Secondary | ICD-10-CM | POA: Insufficient documentation

## 2024-02-27 NOTE — Patient Instructions (Signed)

## 2024-02-27 NOTE — Progress Notes (Signed)
 Cardiology Office Note:    Date:  02/27/2024   ID:  Nicolas Solis Medicine, DOB Oct 31, 1950, MRN [id number]  PCP:  Sadie Manna, MD   [facility] Providers Cardiologist:  Redell Cave, MD     Referring MD: Sadie Manna, MD   Chief Complaint  Patient presents with   Follow-up    3 month follow up pt has been doing well with no complaints of chest pain, chest pressure or SOB, medciation reviewed verbally with patient    History of Present Illness:    Nicolas Solis is a 73 y.o. male with a hx of CAD (CTO RCA, mild to moderate LAD, left circumflex ), Parkinson's disease, former smoker x50+ years presents for follow-up.   Previously seen for low BP and dizziness.  Midodrine  was started with good effect, blood pressures systolic have been over 100.  Still has occasional dizziness, denies any new falls.  Denies chest pain.  Has no new concerns at this time.  Prior notes/studies Echocardiogram 01/2022 EF 70 to 75% Left heart cath 01/2022 CTO RCA, mild to moderate LAD and LCx  Past Medical History:  Diagnosis Date   Compression fracture of first cervical vertebra (HCC)    Essential tremor    Ileus (HCC)     Past Surgical History:  Procedure Laterality Date   25 GAUGE PARS PLANA VITRECTOMY WITH 20 GAUGE MVR PORT Right 03/18/2017   25 GAUGE PARS PLANA VITRECTOMY WITH 20 GAUGE MVR PORT FOR MACULAR HOLE Right 03/18/2017   Procedure: 25 GAUGE PARS PLANA VITRECTOMY WITH 20 GAUGE MVR PORT FOR MACULAR HOLE; PHOTOCOAGULATION RIGHT EYE;  Surgeon: Alvia Norleen BIRCH, MD;  Location: [facility] OR;  Service: Ophthalmology;  Laterality: Right;   APPENDECTOMY     COLONOSCOPY W/ POLYPECTOMY     GAS INSERTION Right 03/18/2017   Procedure: INSERTION OF GAS RIGHT (C3F8);  Surgeon: Alvia Norleen BIRCH, MD;  Location: [facility] OR;  Service: Ophthalmology;  Laterality: Right;   GAS/FLUID EXCHANGE Right 03/18/2017   Procedure: GAS/FLUID EXCHANGE RIGHT EYE;  Surgeon: Alvia Norleen BIRCH, MD;  Location: [facility] OR;   Service: Ophthalmology;  Laterality: Right;   JOINT REPLACEMENT Right    Hip   LEFT HEART CATH AND CORONARY ANGIOGRAPHY N/A 02/06/2022   Procedure: LEFT HEART CATH AND CORONARY ANGIOGRAPHY;  Surgeon: Darron Deatrice LABOR, MD;  Location: [facility] INVASIVE CV LAB;  Service: Cardiovascular;  Laterality: N/A;   MEMBRANE PEEL Right 03/18/2017   Procedure: MEMBRANE PEEL RIGHT EYE;  Surgeon: Alvia Norleen BIRCH, MD;  Location: [facility] OR;  Service: Ophthalmology;  Laterality: Right;   MOUTH SURGERY     PHOTOCOAGULATION Left 03/18/2017   Procedure: PHOTOCOAGULATION LEFT EYE;  Surgeon: Alvia Norleen BIRCH, MD;  Location: [facility] OR;  Service: Ophthalmology;  Laterality: Left;   SERUM PATCH Right 03/18/2017   Procedure: SERUM PATCH RIGHT EYE;  Surgeon: Alvia Norleen BIRCH, MD;  Location: [facility] OR;  Service: Ophthalmology;  Laterality: Right;    Current Medications: Current Meds  Medication Sig   acetaminophen  (TYLENOL ) 500 MG tablet Take 1,000 mg by mouth every 6 (six) hours as needed (for pain.).   aspirin  EC 81 MG tablet Take 1 tablet (81 mg total) by mouth daily. Swallow whole.   atorvastatin  (LIPITOR) 20 MG tablet Take 1 tablet (20 mg total) by mouth daily.   carbidopa -levodopa  (SINEMET  IR) 25-100 MG tablet Take 2 tablets by mouth 3 (three) times daily before meals.   Carbidopa -Levodopa  ER (SINEMET  CR) 25-100 MG tablet controlled release Take 1 tablet by mouth  at bedtime.   Cholecalciferol  (VITAMIN D3) 50 MCG (2000 UT) TABS Take 2,000 Units by mouth in the morning.   Cyanocobalamin  (VITAMIN B-12) 5000 MCG LOZG Take 2,500 mcg by mouth every evening.   dorzolamide -timolol  (COSOPT ) 22.3-6.8 MG/ML ophthalmic solution Place 1 drop into both eyes daily.   midodrine  (PROAMATINE ) 2.5 MG tablet Take 1 tablet (2.5 mg total) by mouth 3 (three) times daily with meals.   Multiple Vitamins-Minerals (ADULT ONE DAILY GUMMIES PO) Take 2 tablets by mouth every evening.   nitroGLYCERIN  (NITROSTAT ) 0.4 MG SL tablet Place 1 tablet (0.4 mg total)  under the tongue every 5 (five) minutes as needed for chest pain. For a max of 3 doses in 1 day.   Omega-3 Fatty Acids (FISH OIL PO) Take 1,000 mg by mouth in the morning.   polyethylene glycol (MIRALAX  / GLYCOLAX ) 17 g packet Take 17 g by mouth daily.   ranolazine  (RANEXA ) 500 MG 12 hr tablet Take 1 tablet (500 mg total) by mouth 2 (two) times daily.   tamsulosin  (FLOMAX ) 0.4 MG CAPS capsule Take 0.4 mg by mouth daily after supper.     Allergies:   Patient has no known allergies.   Social History   Socioeconomic History   Marital status: Married    Spouse name: Not on file   Number of children: Not on file   Years of education: Not on file   Highest education level: Not on file  Occupational History   Not on file  Tobacco Use   Smoking status: Former    Current packs/day: 0.00    Types: Cigarettes    Start date: 10/1965    Quit date: 10/2015    Years since quitting: 8.3    Passive exposure: Past   Smokeless tobacco: Never  Vaping Use   Vaping status: Never Used  Substance and Sexual Activity   Alcohol use: Yes    Alcohol/week: 7.0 standard drinks of alcohol    Types: 7 Shots of liquor per week   Drug use: No   Sexual activity: Not Currently  Other Topics Concern   Not on file  Social History Narrative   Not on file   Social Drivers of Health   Financial Resource Strain: Low Risk  (12/16/2023)   Received from [facility]   Overall Financial Resource Strain (CARDIA)    Difficulty of Paying Living Expenses: Not hard at all  Food Insecurity: No Food Insecurity (12/16/2023)   Received from [facility]   Hunger Vital Sign    Within the past 12 months, you worried that your food would run out before you got the money to buy more.: Never true    Within the past 12 months, the food you bought just didn't last and you didn't have money to get more.: Never true  Transportation Needs: No Transportation Needs (12/16/2023)   Received from [facility]    In the past 12 months, has lack of transportation kept you from medical appointments or from getting medications?: No    Lack of Transportation (Non-Medical): No  Physical Activity: Not on file  Stress: Not on file  Social Connections: Not on file     Family History: The patient's family history includes Stroke in his brother.  ROS:   Please see the history of present illness.     All other systems reviewed and are negative.  EKGs/Labs/Other Studies Reviewed:    The following  studies were reviewed today:   Recent Labs: No results found for requested labs within last 365 days.  Recent Lipid Panel    Component Value Date/Time   TRIG 78 10/06/2022 1552    Outside lipid panel 02/2021 total cholesterol 154, triglyceride 99, LDL 88, HDL 46  Lipid panel 11/2022 total cholesterol 136, triglycerides 197, LDL 49, HDL 47  Risk Assessment/Calculations:         Physical Exam:    VS:  BP 110/62 (BP Location: Left Arm, Patient Position: Sitting)   Pulse 63   Ht 6' (1.829 m)   Wt 167 lb (75.8 kg)   SpO2 98%   BMI 22.65 kg/m     Wt Readings from Last 3 Encounters:  02/27/24 167 lb (75.8 kg)  11/24/23 168 lb 12.8 oz (76.6 kg)  11/26/22 178 lb (80.7 kg)     GEN:  Well nourished, well developed in no acute distress HEENT: Normal NECK: No JVD; No carotid bruits CARDIAC: RRR, no murmurs, rubs, gallops RESPIRATORY: Decreased breath sounds bilaterally, no wheezing ABDOMEN: Soft, non-tender, non-distended MUSCULOSKELETAL:  No edema; No deformity  SKIN: Warm and dry NEUROLOGIC:  Alert and oriented x 3 PSYCHIATRIC:  Normal affect   ASSESSMENT:    1. Dizziness   2. Coronary artery disease involving native heart, unspecified vessel or lesion type, unspecified whether angina present   3. Hyperlipidemia LDL goal <70    PLAN:    In order of problems listed above:  Dizziness, hypotension.  Parkinson disease may be  contributing.  BP appears normal since starting midodrine .  Continue midodrine  2.5 mg 3 times daily.  Safety precautions while exercising/walking again advised.   CAD, left heart cath 01/2022 showed CTO proximal RCA, mild to moderate proximal LAD and left circumflex disease. Echo with EF 70%.  Denies chest pain. Continue aspirin  81 mg, Lipitor 20 mg, Ranexa  500 mg twice daily. Hyperlipidemia, LDL at goal, continue Lipitor.  Follow-up in 3 months     Medication Adjustments/Labs and Tests Ordered: Current medicines are reviewed at length with the patient today.  Concerns regarding medicines are outlined above.  No orders of the defined types were placed in this encounter.  No orders of the defined types were placed in this encounter.   Patient Instructions  Medication Instructions:  Your physician recommends that you continue on your current medications as directed. Please refer to the Current Medication list given to you today.   *If you need a refill on your cardiac medications before your next appointment, please call your pharmacy*  Lab Work: No labs ordered today  If you have labs (blood work) drawn today and your tests are completely normal, you will receive your results only by: MyChart Message (if you have MyChart) OR A paper copy in the mail If you have any lab test that is abnormal or we need to change your treatment, we will call you to review the results.  Testing/Procedures: No test ordered today   Follow-Up: At [facility], you and your health needs are our priority.  As part of our continuing mission to provide you with exceptional heart care, our providers are all part of one team.  This team includes your primary Cardiologist (physician) and Advanced Practice Providers or APPs (Physician Assistants and Nurse Practitioners) who all work together to provide you with the care you need, when you need it.  Your next appointment:   1 year(s)  Provider:   You may  see Redell Cave, MD or  one of the following Advanced Practice Providers on your designated Care Team:   Lonni Meager, NP Lesley Maffucci, PA-C Bernardino Bring, PA-C Cadence [place], PA-C Tylene Lunch, NP Barnie Hila, NP    We recommend signing up for the patient portal called MyChart.  Sign up information is provided on this After Visit Summary.  MyChart is used to connect with patients for Virtual Visits (Telemedicine).  Patients are able to view lab/test results, encounter notes, upcoming appointments, etc.  Non-urgent messages can be sent to your provider as well.   To learn more about what you can do with MyChart, go to [email or web address].         Signed, Redell Cave, MD  02/27/2024 11:50 AM    [facility]

## 2024-03-01 ENCOUNTER — Other Ambulatory Visit: Payer: Self-pay | Admitting: Cardiology

## 2024-05-24 ENCOUNTER — Other Ambulatory Visit: Payer: Self-pay | Admitting: Cardiology

## 2024-08-09 ENCOUNTER — Other Ambulatory Visit: Payer: Self-pay | Admitting: Physician Assistant

## 2024-08-09 DIAGNOSIS — M79672 Pain in left foot: Secondary | ICD-10-CM

## 2024-08-10 ENCOUNTER — Ambulatory Visit

## 2024-08-10 NOTE — Progress Notes (Signed)
 HISTORY OF PRESENT ILLNESS:   Nicolas Solis is a 74 y.o. male with PMH sniffing and for Parkinson's, dementia, CAD, anemia, hyperglycemia, neuropathy, BPH that is presenting to clinic for CC: left foot concern.   Location: Global -when pressed a little harder his primary complaints are medial aspect of the left hallux toenail, plantar aspect of fifth MPJ, lateral ankle. Duration of Condition: Proximately 6+ weeks Onset: Gradual Course: Progressive Aggravating factors: Pressure hurts when he places his foot down, however at rest pain exists Alleviating factors: Movement without direct pressure Prior treatment: Has seen multiple providers, was treated as cellulitis however redness did not resolve with antibiotic usage.  Was seen by neurology initially, sent for nerve conduction studies which are anticipated to be done this Friday -these were prescribed by Dr. Jamal office.  She reports that he did try gabapentin  100 mg twice daily for issue that could be related to nerves.  He did not note any relief or change when taking this medication.  I advised that this medication may not be at the appropriate dosage and he may require an increased dosage to receive any benefit.  I will defer to our neurology colleagues to adjust the medications as he patient has concurrent diagnoses of Parkinson's.  Patient and wife are aware.  I advised that they should continue the gabapentin  as prescribed given they have a follow-up very soon with neurology and this will show consistency with medication and systemic response.  Patient and wife are aware.  Patient has never seen vascular colleagues.  He does have cardiac history of angiopathy.  He does have a recent history of having a lateral malleoli are injury that resulted in a wound to his left ankle.  He was prescribed antibiotics for this as the redness and pain to the foot was thought to be coming from the wound.  The wound originated after he accidentally  scratched his leg.  This is the first time that he has had a wound present, and antibiotics did not resolve the redness that was noted.  Given redness and pain have progressed and are persistent, they were seen by internal medicine again yesterday who recommended acute follow-up with podiatry today for review.  With internal medicine they obtained baseline imaging which showed no acute fracture or structural issue that was noted.  Symptoms described above related to the patient condition have progressed / not resolved completely prompting visit to our offices today. No other acute pedal complaints at this time.   Past Medical History:  Diagnosis Date   Coronary artery disease 07/30/1999   High triglycerides    Osteoarthritis 2015   Right hip pain    Venous stasis dermatitis      Your Medication List       * Accurate as of August 10, 2024 11:59 PM. Always use your most recent med list.            Instructions 8 am 12 pm 5 pm 8 pm Notes  acetaminophen  500 MG tablet Commonly known as: TYLENOL   Take 500 mg by mouth every 6 (six) hours as needed Take 1,000 mg by mouth every 6 (six) hours as needed (for pain.).             aspirin  81 MG EC tablet  Take 81 mg by mouth once daily  Take 1 tablet (81 mg total) by mouth daily. Swallow whole.             atorvastatin  20 MG tablet Commonly  known as: LIPITOR  Take 20 mg by mouth once daily Take 1 tablet (20 mg total) by mouth daily.             carbidopa -levodopa  25-100 mg CR tablet Commonly known as: SINEMET  CR  Take 1 tablet by mouth at bedtime             carbidopa -levodopa  25-100 mg tablet Commonly known as: SINEMET   Take 2 tablets by mouth 3 (three) times daily             cyanocobalamin  250 MCG tablet Commonly known as: VITAMIN B12  Take 500 mcg by mouth once daily             dorzolamide -timoloL  22.3-6.8 mg/mL ophthalmic solution Commonly known as: COSOPT   Place 1 drop into both eyes once  daily for 180 days             gabapentin  100 MG capsule Commonly known as: NEURONTIN   Take 1 capsule (100 mg total) by mouth 2 (two) times daily             ibuprofen  200 MG tablet Commonly known as: MOTRIN   Take 200 mg by mouth as directed for Pain.             midodrine  2.5 MG tablet Commonly known as: PROAMATINE   Take 2.5 mg by mouth 3 (three) times daily             multivitamin tablet  Take 1 tablet by mouth once daily             nitroGLYcerin  0.4 MG SL tablet Commonly known as: NITROSTAT   Place 0.4 mg under the tongue every 5 (five) minutes as needed             ranolazine  500 MG ER tablet Commonly known as: RANEXA   Take 500 mg by mouth 2 (two) times daily             tamsulosin  0.4 mg capsule Commonly known as: FLOMAX   TAKE ONE CAPSULE BY MOUTH ONCE A DAY 30 MINUTES AFTER THE SAME MEAL                 No Known Allergies Past Surgical History:  Procedure Laterality Date   JOINT REPLACEMENT  04/2010   RIGHT TOTAL HIP REPLACEMENT SURGERY  09/25/2010   [facility]   COLONOSCOPY  01/2011   multiple polyps, divertics   ARTHROPLASTY HIP TOTAL Left 09/29/2018   Procedure: LEFT TOTAL HIP REPLACEMENT;  Surgeon: Charleen Glendia Cooter, MD;  Location: [facility] OR;  Service: Orthopedics;  Laterality: Left;   APPENDECTOMY     EYE SURGERY Right fall 2018   macular hole repair   EYE SURGERY Right spring 2019   cataract    ORAL SURGERY OPERATIVE NOTE     actinomycosis      PHYSICAL EXAMINATION  BP 99/61   Pulse 80   Ht 182.9 cm (6')   Wt 75.8 kg (167 lb)   BMI 22.65 kg/m  Constitutional: Patient appears of normal development and good nutritional status for stated age, well-groomed, and of normal body habitus. Phonating appropriately.  Lymphatic: On examination of both lower extremities, there is no lymphadenopathy, lymphangitis, or lymphedema on either the right or the left feet or legs.  Psychiatric: Alert and oriented to  time, place, person and situation. The patient is noted to have good judgment and insight into the reason for today's visit and treatments.   FOCUSED LOWER EXTREMITY EXAMINATION:  Neurological:  Gross protective sensation intact  No focal motor or sensory deficits identified  No Tinel's or Valliex's sign  Hyperalgesia to the left foot  Vascular:  Dorsalis Pedis artery on palpation: Diminished thready Posterior Tibial artery on palpation: Nonpalpable Capillary Filling Times: Sluggish Peripheral edema: Pitting 1+ Dependency Rubor: Present-significant Varicosities: Present In comparison to the contralateral limb there is diffuse rubor and discoloration of the distal foot to global foot in pigmentation -consistent with vascular compromise more so than infectious source.  Dermatological:  Skin quality: Atrophic No ulcerations, open wounds noted bilaterally.  Interdigital spaces: Clean dry and intact Discoloration with ecchymosis noted to the plantar fifth MPJ.  Unstageable pressure injury.  There is no hyperkeratosis overlying this injured area.  There is no trampolining or fluctuance that is noted suggestive of deeper seeded abscess.  Given inability to palpate pulses will opt to not debride this area.  Musculoskeletal:  Overall foot type: Pes cavus Muscle strength: 4+/5 in all 4 quadrants Ankle Joint ROM:  decreased, equinus noted Global foot - TTP: Global -primary spots that patient cannot tolerate are medial aspect of the hallux distal nail, plantar fifth MPJ, lateral aspect of the ankle and area of partial-thickness ulcerations overlying the malleolus.  3 Views reviewed from 08/09/2024  Diffuse arterial calcifications noted throughout the global foot. Diffuse osteopenic quality of bone. No soft tissue anomaly suggestive of deeper seeded soft tissue infection. Contractures noted at the lesser MPJs to all digits. No acute fractures or dislocations noted  This impression was  discussed/ shared with the patient.    ASSESSMENT/ PLAN: This visit today consisted of multiple elements:   Number and complexity of problems addressed as well as the risk of morbidity\mortality from additional diagnostic testing or treatment: Moderate risk  - 1 acute illness with discussion of side effects of treatment  - Detailed discussion of treatment options, treatment course, and potential prognoses dependent on clinical course / compliance.   I have determined the complexity of the problem and Condition(s), etiologies, and options for care, treatment plan, and prognosis were reviewed with the patient.   Encounter Diagnoses  Name Primary?   Left foot pain    Critical lower limb ischemia (CMS-HCC) Yes   Pressure injury of left foot, stage 1    Ingrown nail    Great toe pain, left    Critical limb ischemia -Wounds:  - Pressure sore, subfifth left foot -horseshoe offloading pad, Betadine daily, Band-Aid while in shoes, permit to air out at night. - Pressure sore-stable fibrotic/eschar lateral malleolus left ankle -discussed concern for critical limb ischemia and vascular changes noted to the global foot extending to the level of the ankle.  We discussed the pressure sore that is subfifth and need for potential vascular evaluation.   *We will submit for ABIs and noninvasive studies to be conducted however instructed patient to follow-up with vascular surgery as well as put in a referral for acute consultation.  - Ingrown nail, medial side left hallux (to avoid soaking the left subfifth) we discussed warm compresses 10 to 15 minutes twice a day for 2 to 3 days.  Slant back debridement conducted.  Neuropathy I do believe there is a component of common peroneal nerve neuropathy given patient's symptoms that are positional and resting at night.  He has noted a weakness in his motor on that lateral aspect of the knee and symptoms starting from the knee and progressing distally can be  consistent with a form of peripheral neuropathy or impingement neuropathy.  He is scheduled to get nerve conduction studies this Friday which should assist in ruling out this versus critical limb ischemia component of nerve pain. - I encouraged patient to follow-up with his neurologist regarding these findings and the nerve conduction study I do believe that there may be 2 separate issues that being neuropathy and critical limb ischemia.  RTC 2 to 3 weeks for follow-up of wound and ensure that this is addressed.   - Should have had either vascular studies or vascular consultation at that time - Follow-up with nerve conduction studies.  This note has been created using automated tools and reviewed for accuracy by AMY GABRIELLE WILSON.   *Some images could not be shown.

## 2024-08-24 ENCOUNTER — Emergency Department

## 2024-08-24 ENCOUNTER — Other Ambulatory Visit: Payer: Self-pay

## 2024-08-24 ENCOUNTER — Inpatient Hospital Stay
Admission: EM | Admit: 2024-08-24 | Source: Home / Self Care | Attending: Emergency Medicine | Admitting: Emergency Medicine

## 2024-08-24 DIAGNOSIS — Z79899 Other long term (current) drug therapy: Secondary | ICD-10-CM | POA: Diagnosis not present

## 2024-08-24 DIAGNOSIS — I96 Gangrene, not elsewhere classified: Secondary | ICD-10-CM | POA: Insufficient documentation

## 2024-08-24 DIAGNOSIS — Z87891 Personal history of nicotine dependence: Secondary | ICD-10-CM

## 2024-08-24 DIAGNOSIS — E785 Hyperlipidemia, unspecified: Secondary | ICD-10-CM | POA: Diagnosis present

## 2024-08-24 DIAGNOSIS — S91302A Unspecified open wound, left foot, initial encounter: Secondary | ICD-10-CM | POA: Diagnosis not present

## 2024-08-24 DIAGNOSIS — I480 Paroxysmal atrial fibrillation: Secondary | ICD-10-CM | POA: Insufficient documentation

## 2024-08-24 DIAGNOSIS — Z7982 Long term (current) use of aspirin: Secondary | ICD-10-CM

## 2024-08-24 DIAGNOSIS — I70222 Atherosclerosis of native arteries of extremities with rest pain, left leg: Secondary | ICD-10-CM | POA: Diagnosis not present

## 2024-08-24 DIAGNOSIS — L97529 Non-pressure chronic ulcer of other part of left foot with unspecified severity: Secondary | ICD-10-CM | POA: Diagnosis not present

## 2024-08-24 DIAGNOSIS — J188 Other pneumonia, unspecified organism: Secondary | ICD-10-CM | POA: Insufficient documentation

## 2024-08-24 DIAGNOSIS — L03116 Cellulitis of left lower limb: Secondary | ICD-10-CM

## 2024-08-24 DIAGNOSIS — I2510 Atherosclerotic heart disease of native coronary artery without angina pectoris: Secondary | ICD-10-CM | POA: Diagnosis present

## 2024-08-24 HISTORY — DX: Presence of unspecified artificial hip joint: Z96.649

## 2024-08-24 LAB — BASIC METABOLIC PANEL WITH GFR
Anion gap: 14 (ref 5–15)
BUN: 16 mg/dL (ref 8–23)
CO2: 25 mmol/L (ref 22–32)
Calcium: 9.3 mg/dL (ref 8.9–10.3)
Chloride: 93 mmol/L — ABNORMAL LOW (ref 98–111)
Creatinine, Ser: 0.56 mg/dL — ABNORMAL LOW (ref 0.61–1.24)
GFR, Estimated: >60 mL/min
Glucose, Bld: 120 mg/dL — ABNORMAL HIGH (ref 70–99)
Potassium: 4.3 mmol/L (ref 3.5–5.1)
Sodium: 131 mmol/L — ABNORMAL LOW (ref 135–145)

## 2024-08-24 LAB — CBC WITH DIFFERENTIAL/PLATELET
Abs Immature Granulocytes: 0.19 10*3/uL — ABNORMAL HIGH (ref 0.00–0.07)
Basophils Absolute: 0.1 10*3/uL (ref 0.0–0.1)
Basophils Relative: 0 %
Eosinophils Absolute: 0.1 10*3/uL (ref 0.0–0.5)
Eosinophils Relative: 0 %
HCT: 31.9 % — ABNORMAL LOW (ref 39.0–52.0)
Hemoglobin: 10.6 g/dL — ABNORMAL LOW (ref 13.0–17.0)
Immature Granulocytes: 1 %
Lymphocytes Relative: 7 %
Lymphs Abs: 1 10*3/uL (ref 0.7–4.0)
MCH: 33.9 pg (ref 26.0–34.0)
MCHC: 33.2 g/dL (ref 30.0–36.0)
MCV: 101.9 fL — ABNORMAL HIGH (ref 80.0–100.0)
Monocytes Absolute: 1 10*3/uL (ref 0.1–1.0)
Monocytes Relative: 7 %
Neutro Abs: 13.1 10*3/uL — ABNORMAL HIGH (ref 1.7–7.7)
Neutrophils Relative %: 85 %
Platelets: 283 10*3/uL (ref 150–400)
RBC: 3.13 MIL/uL — ABNORMAL LOW (ref 4.22–5.81)
RDW: 13.3 % (ref 11.5–15.5)
WBC: 15.4 10*3/uL — ABNORMAL HIGH (ref 4.0–10.5)
nRBC: 0 % (ref 0.0–0.2)

## 2024-08-24 LAB — PROTIME-INR
INR: 1.1 (ref 0.8–1.2)
Prothrombin Time: 14.6 s (ref 11.4–15.2)

## 2024-08-24 LAB — PROCALCITONIN: Procalcitonin: 0.16 ng/mL

## 2024-08-24 LAB — LACTIC ACID, PLASMA: Lactic Acid, Venous: 1.3 mmol/L (ref 0.5–1.9)

## 2024-08-24 LAB — APTT: aPTT: 36 s (ref 24–36)

## 2024-08-24 MED ORDER — PIPERACILLIN-TAZOBACTAM 3.375 G IVPB 30 MIN
3.3750 g | Freq: Once | INTRAVENOUS | Status: AC
  Administered 2024-08-24: 3.375 g via INTRAVENOUS
  Filled 2024-08-24: qty 50

## 2024-08-24 MED ORDER — MORPHINE SULFATE (PF) 4 MG/ML IV SOLN
4.0000 mg | Freq: Once | INTRAVENOUS | Status: AC
  Administered 2024-08-24: 4 mg via INTRAVENOUS
  Filled 2024-08-24: qty 1

## 2024-08-24 MED ORDER — SODIUM CHLORIDE 0.9 % IV SOLN: INTRAVENOUS | Status: AC

## 2024-08-24 MED ORDER — HEPARIN (PORCINE) 25000 UT/250ML-% IV SOLN
1800.0000 [IU]/h | INTRAVENOUS | Status: DC
  Administered 2024-08-24 (×2): 1200 [IU]/h via INTRAVENOUS
  Filled 2024-08-24 (×2): qty 250

## 2024-08-24 MED ORDER — VANCOMYCIN HCL 1750 MG/350ML IV SOLN
1750.0000 mg | Freq: Once | INTRAVENOUS | Status: AC
  Administered 2024-08-24: 1750 mg via INTRAVENOUS
  Filled 2024-08-24: qty 350

## 2024-08-24 MED ORDER — VANCOMYCIN HCL IN DEXTROSE 1-5 GM/200ML-% IV SOLN
1000.0000 mg | Freq: Two times a day (BID) | INTRAVENOUS | Status: DC
  Administered 2024-08-25: 1000 mg via INTRAVENOUS
  Filled 2024-08-24 (×3): qty 200

## 2024-08-24 MED ORDER — OXYCODONE HCL 5 MG PO TABS
5.0000 mg | ORAL_TABLET | ORAL | Status: AC | PRN
  Administered 2024-08-24 – 2024-09-03 (×17): 5 mg via ORAL
  Filled 2024-08-24 (×17): qty 1

## 2024-08-24 MED ORDER — ONDANSETRON HCL 4 MG/2ML IJ SOLN: 4.0000 mg | Freq: Four times a day (QID) | INTRAMUSCULAR | Status: AC | PRN

## 2024-08-24 MED ORDER — HEPARIN SODIUM (PORCINE) 5000 UNIT/ML IJ SOLN
4000.0000 [IU] | Freq: Once | INTRAMUSCULAR | Status: AC
  Administered 2024-08-24: 4000 [IU] via INTRAVENOUS
  Filled 2024-08-24: qty 1

## 2024-08-24 MED ORDER — CARBIDOPA-LEVODOPA ER 25-100 MG PO TBCR
1.0000 | EXTENDED_RELEASE_TABLET | Freq: Every day | ORAL | Status: AC
  Administered 2024-08-24 – 2024-09-03 (×10): 1 via ORAL
  Filled 2024-08-24 (×12): qty 1

## 2024-08-24 MED ORDER — ACETAMINOPHEN 325 MG PO TABS
650.0000 mg | ORAL_TABLET | Freq: Four times a day (QID) | ORAL | Status: AC | PRN
  Administered 2024-08-25 – 2024-09-03 (×2): 650 mg via ORAL
  Filled 2024-08-24 (×2): qty 2

## 2024-08-24 MED ORDER — MORPHINE SULFATE (PF) 2 MG/ML IV SOLN
2.0000 mg | INTRAVENOUS | Status: DC | PRN
  Administered 2024-08-24 – 2024-09-02 (×20): 2 mg via INTRAVENOUS
  Filled 2024-08-24 (×21): qty 1

## 2024-08-24 MED ORDER — SODIUM CHLORIDE 0.9 % IV SOLN
1.0000 g | INTRAVENOUS | Status: AC
  Administered 2024-08-24 – 2024-09-02 (×10): 1 g via INTRAVENOUS
  Filled 2024-08-24 (×10): qty 10

## 2024-08-24 MED ORDER — ONDANSETRON HCL 4 MG PO TABS: 4.0000 mg | ORAL_TABLET | Freq: Four times a day (QID) | ORAL | Status: AC | PRN

## 2024-08-24 MED ORDER — CARBIDOPA-LEVODOPA 25-100 MG PO TABS
2.0000 | ORAL_TABLET | Freq: Three times a day (TID) | ORAL | Status: AC
  Administered 2024-08-25 – 2024-09-03 (×24): 2 via ORAL
  Filled 2024-08-24 (×31): qty 2

## 2024-08-24 MED ORDER — ACETAMINOPHEN 650 MG RE SUPP: 650.0000 mg | Freq: Four times a day (QID) | RECTAL | Status: AC | PRN

## 2024-08-24 MED ORDER — DOCUSATE SODIUM 100 MG PO CAPS
100.0000 mg | ORAL_CAPSULE | Freq: Two times a day (BID) | ORAL | Status: AC
  Administered 2024-08-24 – 2024-09-03 (×19): 100 mg via ORAL
  Filled 2024-08-24 (×20): qty 1

## 2024-08-24 MED ORDER — POLYETHYLENE GLYCOL 3350 17 G PO PACK
17.0000 g | PACK | Freq: Every day | ORAL | Status: AC | PRN
  Administered 2024-08-27 – 2024-09-01 (×2): 17 g via ORAL
  Filled 2024-08-24 (×2): qty 1

## 2024-08-24 MED ORDER — GABAPENTIN 100 MG PO CAPS
100.0000 mg | ORAL_CAPSULE | Freq: Two times a day (BID) | ORAL | Status: AC
  Administered 2024-08-24 – 2024-09-03 (×20): 100 mg via ORAL
  Filled 2024-08-24 (×20): qty 1

## 2024-08-24 NOTE — ED Notes (Addendum)
 Patient pulled out both IVs. RN was in emergency in other room and unable to turn off beeping noise in room in time before patient pulled out both IVs. Paramedic that was transporting patient made RN aware before transport up to room for documentation. Patient was AOX4.

## 2024-08-24 NOTE — Progress Notes (Signed)
 Pharmacy Antibiotic Note  Nicolas Solis is a 74 y.o. male admitted on 08/24/2024 with concern for ulcer and decreased circulation to the left foot. The patient was initially seen at a PCP appointment today and was referred to the ED due to multiple wounds, erythema, and swelling of the left foot extending up to the calf. PMH significant for Parkinson's, dementia, CAD, anemia, hyperglycemia, neuropathy, and BPH. Pharmacist. Pharmacy has been consulted for Vancomycin  dosing and monitoring due to concern for cellulitis.  1/27: Scr 0.56 is at BL~0.4-0.6. Patient has leukocytosis, with WBC 15.4. Left foot xray unremarkable for OM.   Plan: - Vancomycin  1750 mg IV loading dose x 1 ordered by ED provider - Will start Vancomycin  maintenance regimen at 1000 mg IV q12h (eAUC 463.5, Cmin 12.6, Scr 0.8, TBW used, Vd 0.72 L/kg) - Goal AUC 400-550 - Patient also receiving Ceftriaxone  1 g IV q24h  - Will continue to monitor renal function and signs of clinical improvement     Height: 6' (182.9 cm) Weight: 77.1 kg (170 lb) IBW/kg (Calculated) : 77.6  Temp (24hrs), Avg:98.7 F (37.1 C), Min:98.6 F (37 C), Max:98.7 F (37.1 C)  Recent Labs  Lab 08/24/24 1407  WBC 15.4*  CREATININE 0.56*  LATICACIDVEN 1.3    Estimated Creatinine Clearance: 88.3 mL/min (A) (by C-G formula based on SCr of 0.56 mg/dL (L)).    Allergies[1]  Antimicrobials this admission: Zosyn  x 1 Ceftriaxone  1/27 >> Vancomycin  1/27 >>  Dose adjustments this admission:   Microbiology results: 1/27 BCx: in process  Thank you for allowing pharmacy to be a part of this patients care.   Ransom Blanch PGY-1 Pharmacy Resident   - [facility]  08/24/2024 5:41 PM      [1] No Known Allergies

## 2024-08-24 NOTE — Consult Note (Signed)
 " [facility] Vascular Consult Note  MRN : [id number]  Nicolas Solis is a 74 y.o. (10-Dec-1950) male who presents with chief complaint of  Chief Complaint  Patient presents with   Wound Infection  .   Consulting Physician:Dylan Claudene, MD Reason for consult: Critical limb ischemia History of Present Illness: Nicolas Solis is a 74 year old male with a past medical history of coronary artery disease, Parkinson's disease, and abdominal aortic aneurysm last measured at 3.3 cm.  He presented to [facility] on 08/24/2024 due to worsening foot pain and wound.  This occurred on his left lower extremity.  His wife notes this has been ongoing for the last several weeks that initially started as just numbness in the left lower extremity.  Initially was thought that the patient may have neuropathy however he subsequently developed a wound on his left foot.  Initially was treated with antibiotics believing it to be more cellulitis but it has progressively worsened.  In addition to the wound worsening he has begun to experience progressive rest pain.  He recently visited podiatry who felt that his worsening wound may be related to vascular disease and placed a referral for us  to see the patient, however prior to being able to schedule a visit his pain subsequently worsened.  The patient is a previous smoker and stopped several years ago.  He had a bedside ABI done on the left lower extremity which showed 0.54.  Current Facility-Administered Medications  Medication Dose Route Frequency Provider Last Rate Last Admin   heparin  ADULT infusion 100 units/mL (25000 units/250mL)  1,200 Units/hr Intravenous Continuous Perley Lum DEL, RPH 12 mL/hr at 08/24/24 1502 1,200 Units/hr at 08/24/24 1502   vancomycin  (VANCOREADY) IVPB 1750 mg/350 mL  1,750 mg Intravenous Once Smith, Dylan, MD 175 mL/hr at 08/24/24 1451 1,750 mg at 08/24/24 1451   Current Outpatient Medications   Medication Sig Dispense Refill   acetaminophen  (TYLENOL ) 500 MG tablet Take 1,000 mg by mouth every 6 (six) hours as needed (for pain.).     aspirin  EC 81 MG tablet Take 1 tablet (81 mg total) by mouth daily. Swallow whole. 90 tablet 3   atorvastatin  (LIPITOR) 20 MG tablet Take 1 tablet (20 mg total) by mouth daily. 90 tablet 3   carbidopa -levodopa  (SINEMET  IR) 25-100 MG tablet Take 2 tablets by mouth 3 (three) times daily before meals.     Carbidopa -Levodopa  ER (SINEMET  CR) 25-100 MG tablet controlled release Take 1 tablet by mouth at bedtime.     Cholecalciferol  (VITAMIN D3) 50 MCG (2000 UT) TABS Take 2,000 Units by mouth in the morning.     Cyanocobalamin  (VITAMIN B-12) 5000 MCG LOZG Take 2,500 mcg by mouth every evening.     dorzolamide -timolol  (COSOPT ) 22.3-6.8 MG/ML ophthalmic solution Place 1 drop into both eyes daily.     midodrine  (PROAMATINE ) 2.5 MG tablet Take 1 tablet (2.5 mg total) by mouth 3 (three) times daily with meals. 270 tablet 2   Multiple Vitamins-Minerals (ADULT ONE DAILY GUMMIES PO) Take 2 tablets by mouth every evening.     nitroGLYCERIN  (NITROSTAT ) 0.4 MG SL tablet Place 1 tablet (0.4 mg total) under the tongue every 5 (five) minutes as needed for chest pain. For a max of 3 doses in 1 day. 30 tablet 1   Omega-3 Fatty Acids (FISH OIL PO) Take 1,000 mg by mouth in the morning.     polyethylene glycol (MIRALAX  / GLYCOLAX ) 17 g packet Take 17 g by  mouth daily. 14 each 0   ranolazine  (RANEXA ) 500 MG 12 hr tablet TAKE ONE TABLET BY MOUTH TWICE DAILY 180 tablet 3   tamsulosin  (FLOMAX ) 0.4 MG CAPS capsule Take 0.4 mg by mouth daily after supper.      Past Medical History:  Diagnosis Date   Compression fracture of first cervical vertebra (HCC)    Essential tremor    Ileus (HCC)     Past Surgical History:  Procedure Laterality Date   25 GAUGE PARS PLANA VITRECTOMY WITH 20 GAUGE MVR PORT Right 03/18/2017   25 GAUGE PARS PLANA VITRECTOMY WITH 20 GAUGE MVR PORT FOR MACULAR  HOLE Right 03/18/2017   Procedure: 25 GAUGE PARS PLANA VITRECTOMY WITH 20 GAUGE MVR PORT FOR MACULAR HOLE; PHOTOCOAGULATION RIGHT EYE;  Surgeon: Alvia Norleen BIRCH, MD;  Location: [facility] OR;  Service: Ophthalmology;  Laterality: Right;   APPENDECTOMY     COLONOSCOPY W/ POLYPECTOMY     GAS INSERTION Right 03/18/2017   Procedure: INSERTION OF GAS RIGHT (C3F8);  Surgeon: Alvia Norleen BIRCH, MD;  Location: [facility] OR;  Service: Ophthalmology;  Laterality: Right;   GAS/FLUID EXCHANGE Right 03/18/2017   Procedure: GAS/FLUID EXCHANGE RIGHT EYE;  Surgeon: Alvia Norleen BIRCH, MD;  Location: [facility] OR;  Service: Ophthalmology;  Laterality: Right;   JOINT REPLACEMENT Right    Hip   LEFT HEART CATH AND CORONARY ANGIOGRAPHY N/A 02/06/2022   Procedure: LEFT HEART CATH AND CORONARY ANGIOGRAPHY;  Surgeon: Darron Deatrice LABOR, MD;  Location: [facility] INVASIVE CV LAB;  Service: Cardiovascular;  Laterality: N/A;   MEMBRANE PEEL Right 03/18/2017   Procedure: MEMBRANE PEEL RIGHT EYE;  Surgeon: Alvia Norleen BIRCH, MD;  Location: [facility] OR;  Service: Ophthalmology;  Laterality: Right;   MOUTH SURGERY     PHOTOCOAGULATION Left 03/18/2017   Procedure: PHOTOCOAGULATION LEFT EYE;  Surgeon: Alvia Norleen BIRCH, MD;  Location: [facility] OR;  Service: Ophthalmology;  Laterality: Left;   SERUM PATCH Right 03/18/2017   Procedure: SERUM PATCH RIGHT EYE;  Surgeon: Alvia Norleen BIRCH, MD;  Location: [facility] OR;  Service: Ophthalmology;  Laterality: Right;    Social History Social History[1]  Family History Family History  Problem Relation Age of Onset   Stroke Brother     Allergies[2]   REVIEW OF SYSTEMS (Negative unless checked)  Constitutional: [] Weight loss  [] Fever  [] Chills Cardiac: [] Chest pain   [] Chest pressure   [] Palpitations   [] Shortness of breath when laying flat   [] Shortness of breath at rest   [] Shortness of breath with exertion. Vascular:  [] Pain in legs with walking   [] Pain in legs at rest   [] Pain in legs when laying flat   [] Claudication   [] Pain in  feet when walking  [] Pain in feet at rest  [x] Pain in feet when laying flat   [] History of DVT   [] Phlebitis   [] Swelling in legs   [] Varicose veins   [x] Non-healing ulcers Pulmonary:   [] Uses home oxygen   [] Productive cough   [] Hemoptysis   [] Wheeze  [] COPD   [] Asthma Neurologic:  [] Dizziness  [] Blackouts   [] Seizures   [] History of stroke   [] History of TIA  [] Aphasia   [] Temporary blindness   [] Dysphagia   [] Weakness or numbness in arms   [x] Weakness or numbness in legs Musculoskeletal:  [] Arthritis   [] Joint swelling   [] Joint pain   [] Low back pain Hematologic:  [] Easy bruising  [] Easy bleeding   [] Hypercoagulable state   [] Anemic  [] Hepatitis Gastrointestinal:  [] Blood in stool   [] Vomiting  blood  [] Gastroesophageal reflux/heartburn   [] Difficulty swallowing. Genitourinary:  [] Chronic kidney disease   [] Difficult urination  [] Frequent urination  [] Burning with urination   [] Blood in urine Skin:  [] Rashes   [] Ulcers   [] Wounds Psychological:  [] History of anxiety   []  History of major depression.  Physical Examination  Vitals:   08/24/24 1348 08/24/24 1349 08/24/24 1415 08/24/24 1430  BP: (!) 78/40 (!) 144/80  130/71  Pulse:   100 97  Resp:   13 16  Temp:      TempSrc:      SpO2:   94% 100%  Weight:      Height:       Body mass index is 23.06 kg/m. Gen:  WD/WN, NAD Head: [place]/AT, No temporalis wasting. Prominent temp pulse not noted. Ear/Nose/Throat: Hearing grossly intact, nares w/o erythema or drainage, oropharynx w/o Erythema/Exudate Eyes: Sclera non-icteric, conjunctiva clear Neck: Trachea midline.  No JVD.  Pulmonary:  Good air movement, respirations not labored, equal bilaterally.  Cardiac: RRR, normal S1, S2. Vascular: Wound on the subfifth left foot.  Left foot cooler with ruddy appearance Vessel Right Left  PT Not Palpable Not Palpable  DP Trace Palpable Not Palpable   Gastrointestinal: soft, non-tender/non-distended. No guarding/reflex.  Musculoskeletal: M/S 5/5  throughout.  Extremities without ischemic changes.  No deformity or atrophy.  1+ edema on left lower extremity Neurologic: Sensation grossly intact in extremities.  Symmetrical.  Speech is fluent. Motor exam as listed above.  Tremors Psychiatric: Judgment intact, Mood & affect appropriate for pt's clinical situation. Lymph : No Cervical, Axillary, or Inguinal lymphadenopathy.    CBC Lab Results  Component Value Date   WBC 15.4 (H) 08/24/2024   HGB 10.6 (L) 08/24/2024   HCT 31.9 (L) 08/24/2024   MCV 101.9 (H) 08/24/2024   PLT 283 08/24/2024    BMET    Component Value Date/Time   NA 131 (L) 08/24/2024 1407   K 4.3 08/24/2024 1407   CL 93 (L) 08/24/2024 1407   CO2 25 08/24/2024 1407   GLUCOSE 120 (H) 08/24/2024 1407   BUN 16 08/24/2024 1407   CREATININE 0.56 (L) 08/24/2024 1407   CALCIUM  9.3 08/24/2024 1407   GFRNONAA >60 08/24/2024 1407   GFRAA >60 03/18/2017 0925   Estimated Creatinine Clearance: 88.3 mL/min (A) (by C-G formula based on SCr of 0.56 mg/dL (L)).  COAG Lab Results  Component Value Date   INR 1.1 08/24/2024    Radiology DG Foot Complete Left Result Date: 08/24/2024 EXAM: 3 OR MORE VIEW(S) XRAY OF THE LEFT FOOT 08/24/2024 02:19:00 PM COMPARISON: None available. CLINICAL HISTORY: Ischemic left foot, cellulitis. Nonhealing ulcer laterally over the fifth metatarsal. Evaluate signs of osteomyelitis. FINDINGS: BONES AND JOINTS: No acute fracture. No malalignment. No underlying bone erosion to suggest osteomyelitis. Degenerative changes in the intertarsal joints. SOFT TISSUES: Vascular calcifications. Mild soft tissue edema about the lateral foot. Soft tissue swelling with focal soft tissue defect over the 5th metatarsal head. IMPRESSION: 1. No evidence of osteomyelitis. 2. Soft tissue swelling with focal soft tissue defect over the fifth metatarsal head. 3. Mild soft tissue edema about the lateral foot. 4. Vascular calcifications. Electronically signed by: Elsie Gravely MD 08/24/2024 03:08 PM EST RP Workstation: [id number]      Assessment/Plan 1.  Atherosclerosis obliterans with ulceration  Today the patient bedside ABI was 0.54.  Based upon much of the history the patient is describing rest pain that has been going on for the last several weeks and worsening.  Based on his worsening wound it would be in his best interest to move forward with an angiogram.  I have discussed with the wife and patient that not intervention could lead to worsening outcomes up to and including amputation.  We have discussed the risk, benefits and alternatives of the procedure and they are agreeable to move forward with intervention.  We will plan on angiogram of the left lower extremity on 08/25/2024.  Recommended the patient be placed on heparin  in the interim.  2.  Left foot wound Currently seeing [facility] clinic podiatry outpatient.  Would recommend podiatry consult while inpatient to evaluate if any intervention is necessary while he is in-house    Plan of care discussed with Dr.Dew and he is in agreement with plan noted above.   Family Communication: Wife at bedside   I spent 75 minutes in this encounter including personally reviewing extensive medical records, personally reviewing imaging studies and compared to prior scans, counseling the patient, placing orders, coordinating care and performing appropriate documentation  Thank you for allowing us  to participate in the care of this patient.   Devondre Guzzetta E Antaniya Venuti, NP [facility] [phone] (Office Phone) [phone] (Office Fax) [phone] (Pager)  08/24/2024 3:11 PM  Staff may message me via secure chat in Epic  but this may not receive immediate response,  please page for urgent matters!  Dictation software was used to generate the above note. Typos may occur and escape review, as with typed/written notes. Any error is purely unintentional.  Please contact me directly for  clarity if needed.      [1]  Social History Tobacco Use   Smoking status: Former    Current packs/day: 0.00    Types: Cigarettes    Start date: 10/1965    Quit date: 10/2015    Years since quitting: 8.8    Passive exposure: Past   Smokeless tobacco: Never  Vaping Use   Vaping status: Never Used  Substance Use Topics   Alcohol use: Yes    Alcohol/week: 7.0 standard drinks of alcohol    Types: 7 Shots of liquor per week   Drug use: No  [2] No Known Allergies  "

## 2024-08-24 NOTE — H&P (Signed)
 " History and Physical    Patient: Nicolas Solis FMW:[id number] DOB: Sep 10, 1950 DOA: 08/24/2024 DOS: the patient was seen and examined on 08/24/2024 PCP: Sadie Manna, MD  Patient coming from: Home  Chief Complaint:  Chief Complaint  Patient presents with   Wound Infection   HPI: Nicolas Solis is a 74 y.o. male with medical history significant of coronary artery disease, Parkinson's disease presenting to the emergency department for evaluation of worsening pain, swelling, and discoloration of his left foot.  History is per his wife, present at bedside.  He has had numbness and tingling of his left leg from the knee throughout his foot since December.  At some point he developed an ulceration at the base of his fifth toe on the left that has been increasing in size for the past few weeks. No known injury or drainage from the wound. He started having chills 2 nights ago. Denies other systemic symptoms.     He's taken a course of antibiotics and was prescribed gabapentin  without improvement.    He saw Podiatry about two weeks ago and was referred to Vascular Surgery but has not yet seen them. He was in for follow up with PCP today and was referred to the ED given concern for acute limb ischemia   In the ED he met SIRS criteria with WBC and tachycardia. BP soft initially but fluid responsive.  XR left foot without evidence of OM. Bedside ABI  0.54.  Vascular surgery was consulted.  He was started on heparin  drip and antibiotics with plan for angiography tomorrow. Hospitalist consulted for admission    Review of Systems: Review of Systems  Constitutional:  Positive for chills. Negative for fever and malaise/fatigue.  Respiratory:  Negative for cough, sputum production and shortness of breath.   Cardiovascular:  Positive for leg swelling. Negative for chest pain.  Gastrointestinal:  Positive for constipation. Negative for diarrhea, nausea and vomiting.  Genitourinary:  Negative for  dysuria and frequency.  Musculoskeletal:  Positive for joint pain.  Skin:        Lesion on left foot   Neurological:  Positive for dizziness (chronic dizzy spells), tingling and tremors. Negative for loss of consciousness.    Past Medical History:  Diagnosis Date   Compression fracture of first cervical vertebra (HCC)    Essential tremor    Ileus (HCC)    Past Surgical History:  Procedure Laterality Date   25 GAUGE PARS PLANA VITRECTOMY WITH 20 GAUGE MVR PORT Right 03/18/2017   25 GAUGE PARS PLANA VITRECTOMY WITH 20 GAUGE MVR PORT FOR MACULAR HOLE Right 03/18/2017   Procedure: 25 GAUGE PARS PLANA VITRECTOMY WITH 20 GAUGE MVR PORT FOR MACULAR HOLE; PHOTOCOAGULATION RIGHT EYE;  Surgeon: Alvia Norleen BIRCH, MD;  Location: [facility] OR;  Service: Ophthalmology;  Laterality: Right;   APPENDECTOMY     COLONOSCOPY W/ POLYPECTOMY     GAS INSERTION Right 03/18/2017   Procedure: INSERTION OF GAS RIGHT (C3F8);  Surgeon: Alvia Norleen BIRCH, MD;  Location: [facility] OR;  Service: Ophthalmology;  Laterality: Right;   GAS/FLUID EXCHANGE Right 03/18/2017   Procedure: GAS/FLUID EXCHANGE RIGHT EYE;  Surgeon: Alvia Norleen BIRCH, MD;  Location: [facility] [place];  Service: Ophthalmology;  Laterality: Right;   JOINT REPLACEMENT Right    Hip   LEFT HEART CATH AND CORONARY ANGIOGRAPHY N/A 02/06/2022   Procedure: LEFT HEART CATH AND CORONARY ANGIOGRAPHY;  Surgeon: Darron Deatrice LABOR, MD;  Location: [facility] INVASIVE CV LAB;  Service: Cardiovascular;  Laterality: N/A;  MEMBRANE PEEL Right 03/18/2017   Procedure: MEMBRANE PEEL RIGHT EYE;  Surgeon: Alvia Norleen BIRCH, MD;  Location: [facility] OR;  Service: Ophthalmology;  Laterality: Right;   MOUTH SURGERY     PHOTOCOAGULATION Left 03/18/2017   Procedure: PHOTOCOAGULATION LEFT EYE;  Surgeon: Alvia Norleen BIRCH, MD;  Location: [facility] OR;  Service: Ophthalmology;  Laterality: Left;   SERUM PATCH Right 03/18/2017   Procedure: SERUM PATCH RIGHT EYE;  Surgeon: Alvia Norleen BIRCH, MD;  Location: [facility] OR;  Service:  Ophthalmology;  Laterality: Right;   Social History:  reports that he quit smoking about 8 years ago. His smoking use included cigarettes. He started smoking about 58 years ago. He has been exposed to tobacco smoke. He has never used smokeless tobacco. He reports current alcohol use of about 7.0 standard drinks of alcohol per week. He reports that he does not use drugs.  Allergies[1]  Family History  Problem Relation Age of Onset   Stroke Brother     Prior to Admission medications  Medication Sig Start Date End Date Taking? Authorizing Provider  acetaminophen  (TYLENOL ) 500 MG tablet Take 1,000 mg by mouth every 6 (six) hours as needed (for pain.).    [provider]  aspirin  EC 81 MG tablet Take 1 tablet (81 mg total) by mouth daily. Swallow whole. 02/01/22   Darliss Rogue, MD  atorvastatin  (LIPITOR) 20 MG tablet Take 1 tablet (20 mg total) by mouth daily. 01/15/24   Darliss Rogue, MD  carbidopa -levodopa  (SINEMET  IR) 25-100 MG tablet Take 2 tablets by mouth 3 (three) times daily before meals. 12/05/21   [provider]  Carbidopa -Levodopa  ER (SINEMET  CR) 25-100 MG tablet controlled release Take 1 tablet by mouth at bedtime. 12/05/21   [provider]  Cholecalciferol  (VITAMIN D3) 50 MCG (2000 UT) TABS Take 2,000 Units by mouth in the morning.    [provider]  Cyanocobalamin  (VITAMIN B-12) 5000 MCG LOZG Take 2,500 mcg by mouth every evening.    [provider]  dorzolamide -timolol  (COSOPT ) 22.3-6.8 MG/ML ophthalmic solution Place 1 drop into both eyes daily. 10/04/21   [provider]  midodrine  (PROAMATINE ) 2.5 MG tablet Take 1 tablet (2.5 mg total) by mouth 3 (three) times daily with meals. 05/25/24   Darliss Rogue, MD  Multiple Vitamins-Minerals (ADULT ONE DAILY GUMMIES PO) Take 2 tablets by mouth every evening.    [provider]  nitroGLYCERIN  (NITROSTAT ) 0.4 MG SL tablet Place 1 tablet (0.4 mg total) under the  tongue every 5 (five) minutes as needed for chest pain. For a max of 3 doses in 1 day. 09/26/22   Darliss Rogue, MD  Omega-3 Fatty Acids (FISH OIL PO) Take 1,000 mg by mouth in the morning.    [provider]  polyethylene glycol (MIRALAX  / GLYCOLAX ) 17 g packet Take 17 g by mouth daily. 10/11/22   Agbata, Tochukwu, MD  ranolazine  (RANEXA ) 500 MG 12 hr tablet TAKE ONE TABLET BY MOUTH TWICE DAILY 03/03/24   Darliss Rogue, MD  tamsulosin  (FLOMAX ) 0.4 MG CAPS capsule Take 0.4 mg by mouth daily after supper. 09/08/21   [provider]    Physical Exam: Vitals:   08/24/24 1349 08/24/24 1415 08/24/24 1430 08/24/24 1711  BP: (!) 144/80  130/71 138/75  Pulse:  100 97 97  Resp:  13 16 16   Temp:    98.7 F (37.1 C)  TempSrc:      SpO2:  94% 100% 95%  Weight:      Height:  Physical Exam Vitals and nursing note reviewed.  Constitutional:      General: He is not in acute distress.    Appearance: He is not toxic-appearing.  HENT:     Head: Normocephalic and atraumatic.     Mouth/Throat:     Mouth: Mucous membranes are moist.  Eyes:     Extraocular Movements: Extraocular movements intact.     Pupils: Pupils are equal, round, and reactive to light.  Cardiovascular:     Rate and Rhythm: Normal rate and regular rhythm.  Pulmonary:     Effort: Pulmonary effort is normal.     Breath sounds: Normal breath sounds.  Abdominal:     General: There is no distension.     Palpations: Abdomen is soft.     Tenderness: There is no abdominal tenderness.  Musculoskeletal:        General: Swelling (Left foot and LE to midcalf) present.     Cervical back: Neck supple.     Comments: B/l feet warm, left foot DP pulse not able to be palpated due to swelling, right foot DP pulse strong   Skin:    Capillary Refill: Capillary refill takes less than 2 seconds.     Comments: Purple/blue discoloration from foot to mid calf  Black eschar on plantar surface of fifth MTP on left foot  with surrounding erythema  Neurological:     Mental Status: He is alert. Mental status is at baseline.     Motor: Tremor present. No abnormal muscle tone.     Data Reviewed:   Labs on Admission: I have personally reviewed following labs and imaging studies  CBC: Recent Labs  Lab 08/24/24 1407  WBC 15.4*  NEUTROABS 13.1*  HGB 10.6*  HCT 31.9*  MCV 101.9*  PLT 283   Basic Metabolic Panel: Recent Labs  Lab 08/24/24 1407  NA 131*  K 4.3  CL 93*  CO2 25  GLUCOSE 120*  BUN 16  CREATININE 0.56*  CALCIUM  9.3   GFR: Estimated Creatinine Clearance: 88.3 mL/min (A) (by C-G formula based on SCr of 0.56 mg/dL (L)). Liver Function Tests: No results for input(s): AST, ALT, ALKPHOS, BILITOT, PROT, ALBUMIN in the last 168 hours. No results for input(s): LIPASE, AMYLASE in the last 168 hours. No results for input(s): AMMONIA in the last 168 hours. Coagulation Profile: Recent Labs  Lab 08/24/24 1407  INR 1.1   Cardiac Enzymes: No results for input(s): CKTOTAL, CKMB, CKMBINDEX, TROPONINI in the last 168 hours. BNP (last 3 results) No results for input(s): PROBNP in the last 8760 hours. HbA1C: No results for input(s): HGBA1C in the last 72 hours. CBG: No results for input(s): GLUCAP in the last 168 hours. Lipid Profile: No results for input(s): CHOL, HDL, LDLCALC, TRIG, CHOLHDL, LDLDIRECT in the last 72 hours. Thyroid Function Tests: No results for input(s): TSH, T4TOTAL, FREET4, T3FREE, THYROIDAB in the last 72 hours. Anemia Panel: No results for input(s): VITAMINB12, FOLATE, FERRITIN, TIBC, IRON, RETICCTPCT in the last 72 hours. Urine analysis:    Component Value Date/Time   COLORURINE YELLOW (A) 10/06/2022 1338   APPEARANCEUR CLEAR (A) 10/06/2022 1338   LABSPEC 1.011 10/06/2022 1338   PHURINE 7.0 10/06/2022 1338   GLUCOSEU NEGATIVE 10/06/2022 1338   HGBUR NEGATIVE 10/06/2022 1338   BILIRUBINUR  NEGATIVE 10/06/2022 1338   KETONESUR 5 (A) 10/06/2022 1338   PROTEINUR NEGATIVE 10/06/2022 1338   NITRITE NEGATIVE 10/06/2022 1338   LEUKOCYTESUR NEGATIVE 10/06/2022 1338    Radiological Exams on Admission:  DG Foot Complete Left Result Date: 08/24/2024 EXAM: 3 OR MORE VIEW(S) XRAY OF THE LEFT FOOT 08/24/2024 02:19:00 PM COMPARISON: None available. CLINICAL HISTORY: Ischemic left foot, cellulitis. Nonhealing ulcer laterally over the fifth metatarsal. Evaluate signs of osteomyelitis. FINDINGS: BONES AND JOINTS: No acute fracture. No malalignment. No underlying bone erosion to suggest osteomyelitis. Degenerative changes in the intertarsal joints. SOFT TISSUES: Vascular calcifications. Mild soft tissue edema about the lateral foot. Soft tissue swelling with focal soft tissue defect over the 5th metatarsal head. IMPRESSION: 1. No evidence of osteomyelitis. 2. Soft tissue swelling with focal soft tissue defect over the fifth metatarsal head. 3. Mild soft tissue edema about the lateral foot. 4. Vascular calcifications. Electronically signed by: Elsie Gravely MD 08/24/2024 03:08 PM EST RP Workstation: [id number]       Assessment and Plan:  Atherosclerosis obliterans with ulceration  - heparin  gtt  - Vascular Surgery assistance appreciated, plan for left lower extremity angiogram tomorrow  - neuro/vascular checks q4 - pain control   SIRS Left foot ulceration with surrounding cellulitis  - leukocytosis and tachycardia, otherwise hemodynamically stable - blood cultures pending  - Podiatry consulted, appreciate recommendations  - rocephin /vancomycin    - IVF  - wound care   Acute on chronic macrocytic anemia  - Hgb slowly owndrending from 12.5 in December 2025 to 10.7 today. No obvious blood loss  - f/u folate, B12 - monitor/transfuse as indicated   Hyponatremia, mild  -NS, monitor    Parkinson's disease  Dementia  - Resume home medications when reconciled  - delirium precautions    CAD HLD - resume home medications when reconciled   Heparin  gtt  NS@75  Heart healthy diet  Monitor/replace electrolytes   Advance Care Planning:   Code Status: Full Code  Discussed with patient at time of admission.   Consults: Vascular Surgery   Family Communication: Wife at bedside   Severity of Illness: The appropriate patient status for this patient is INPATIENT. Inpatient status is judged to be reasonable and necessary in order to provide the required intensity of service to ensure the patient's safety. The patient's presenting symptoms, physical exam findings, and initial radiographic and laboratory data in the context of their chronic comorbidities is felt to place them at high risk for further clinical deterioration. Furthermore, it is not anticipated that the patient will be medically stable for discharge from the hospital within 2 midnights of admission.   * I certify that at the point of admission it is my clinical judgment that the patient will require inpatient hospital care spanning beyond 2 midnights from the point of admission due to high intensity of service, high risk for further deterioration and high frequency of surveillance required.*  Author: Daved JAYSON Pump, DO 08/24/2024 5:22 PM  For on call review [email or web address].      [1] No Known Allergies  "

## 2024-08-24 NOTE — H&P (View-Only) (Signed)
 " [facility] VASC[facility] VEIN SPECIALISTS Vascular Consult Note  MRN : [id number]  Nicolas Solis is a 74 y.o. (10-Dec-1950) male who presents with chief complaint of  Chief Complaint  Patient presents with   Wound Infection  .   Consulting Physician:Dylan Claudene, MD Reason for consult: Critical limb ischemia History of Present Illness: Nicolas Solis is a 74 year old male with a past medical history of coronary artery disease, Parkinson's disease, and abdominal aortic aneurysm last measured at 3.3 cm.  He presented to [facility] on 08/24/2024 due to worsening foot pain and wound.  This occurred on his left lower extremity.  His wife notes this has been ongoing for the last several weeks that initially started as just numbness in the left lower extremity.  Initially was thought that the patient may have neuropathy however he subsequently developed a wound on his left foot.  Initially was treated with antibiotics believing it to be more cellulitis but it has progressively worsened.  In addition to the wound worsening he has begun to experience progressive rest pain.  He recently visited podiatry who felt that his worsening wound may be related to vascular disease and placed a referral for us  to see the patient, however prior to being able to schedule a visit his pain subsequently worsened.  The patient is a previous smoker and stopped several years ago.  He had a bedside ABI done on the left lower extremity which showed 0.54.  Current Facility-Administered Medications  Medication Dose Route Frequency Provider Last Rate Last Admin   heparin  ADULT infusion 100 units/mL (25000 units/250mL)  1,200 Units/hr Intravenous Continuous Perley Lum DEL, RPH 12 mL/hr at 08/24/24 1502 1,200 Units/hr at 08/24/24 1502   vancomycin  (VANCOREADY) IVPB 1750 mg/350 mL  1,750 mg Intravenous Once Smith, Dylan, MD 175 mL/hr at 08/24/24 1451 1,750 mg at 08/24/24 1451   Current Outpatient Medications   Medication Sig Dispense Refill   acetaminophen  (TYLENOL ) 500 MG tablet Take 1,000 mg by mouth every 6 (six) hours as needed (for pain.).     aspirin  EC 81 MG tablet Take 1 tablet (81 mg total) by mouth daily. Swallow whole. 90 tablet 3   atorvastatin  (LIPITOR) 20 MG tablet Take 1 tablet (20 mg total) by mouth daily. 90 tablet 3   carbidopa -levodopa  (SINEMET  IR) 25-100 MG tablet Take 2 tablets by mouth 3 (three) times daily before meals.     Carbidopa -Levodopa  ER (SINEMET  CR) 25-100 MG tablet controlled release Take 1 tablet by mouth at bedtime.     Cholecalciferol  (VITAMIN D3) 50 MCG (2000 UT) TABS Take 2,000 Units by mouth in the morning.     Cyanocobalamin  (VITAMIN B-12) 5000 MCG LOZG Take 2,500 mcg by mouth every evening.     dorzolamide -timolol  (COSOPT ) 22.3-6.8 MG/ML ophthalmic solution Place 1 drop into both eyes daily.     midodrine  (PROAMATINE ) 2.5 MG tablet Take 1 tablet (2.5 mg total) by mouth 3 (three) times daily with meals. 270 tablet 2   Multiple Vitamins-Minerals (ADULT ONE DAILY GUMMIES PO) Take 2 tablets by mouth every evening.     nitroGLYCERIN  (NITROSTAT ) 0.4 MG SL tablet Place 1 tablet (0.4 mg total) under the tongue every 5 (five) minutes as needed for chest pain. For a max of 3 doses in 1 day. 30 tablet 1   Omega-3 Fatty Acids (FISH OIL PO) Take 1,000 mg by mouth in the morning.     polyethylene glycol (MIRALAX  / GLYCOLAX ) 17 g packet Take 17 g by  mouth daily. 14 each 0   ranolazine  (RANEXA ) 500 MG 12 hr tablet TAKE ONE TABLET BY MOUTH TWICE DAILY 180 tablet 3   tamsulosin  (FLOMAX ) 0.4 MG CAPS capsule Take 0.4 mg by mouth daily after supper.      Past Medical History:  Diagnosis Date   Compression fracture of first cervical vertebra (HCC)    Essential tremor    Ileus (HCC)     Past Surgical History:  Procedure Laterality Date   25 GAUGE PARS PLANA VITRECTOMY WITH 20 GAUGE MVR PORT Right 03/18/2017   25 GAUGE PARS PLANA VITRECTOMY WITH 20 GAUGE MVR PORT FOR MACULAR  HOLE Right 03/18/2017   Procedure: 25 GAUGE PARS PLANA VITRECTOMY WITH 20 GAUGE MVR PORT FOR MACULAR HOLE; PHOTOCOAGULATION RIGHT EYE;  Surgeon: Alvia Norleen BIRCH, MD;  Location: [facility] OR;  Service: Ophthalmology;  Laterality: Right;   APPENDECTOMY     COLONOSCOPY W/ POLYPECTOMY     GAS INSERTION Right 03/18/2017   Procedure: INSERTION OF GAS RIGHT (C3F8);  Surgeon: Alvia Norleen BIRCH, MD;  Location: [facility] OR;  Service: Ophthalmology;  Laterality: Right;   GAS/FLUID EXCHANGE Right 03/18/2017   Procedure: GAS/FLUID EXCHANGE RIGHT EYE;  Surgeon: Alvia Norleen BIRCH, MD;  Location: [facility] OR;  Service: Ophthalmology;  Laterality: Right;   JOINT REPLACEMENT Right    Hip   LEFT HEART CATH AND CORONARY ANGIOGRAPHY N/A 02/06/2022   Procedure: LEFT HEART CATH AND CORONARY ANGIOGRAPHY;  Surgeon: Darron Deatrice LABOR, MD;  Location: [facility] INVASIVE CV LAB;  Service: Cardiovascular;  Laterality: N/A;   MEMBRANE PEEL Right 03/18/2017   Procedure: MEMBRANE PEEL RIGHT EYE;  Surgeon: Alvia Norleen BIRCH, MD;  Location: [facility] OR;  Service: Ophthalmology;  Laterality: Right;   MOUTH SURGERY     PHOTOCOAGULATION Left 03/18/2017   Procedure: PHOTOCOAGULATION LEFT EYE;  Surgeon: Alvia Norleen BIRCH, MD;  Location: [facility] OR;  Service: Ophthalmology;  Laterality: Left;   SERUM PATCH Right 03/18/2017   Procedure: SERUM PATCH RIGHT EYE;  Surgeon: Alvia Norleen BIRCH, MD;  Location: [facility] OR;  Service: Ophthalmology;  Laterality: Right;    Social History Social History[1]  Family History Family History  Problem Relation Age of Onset   Stroke Brother     Allergies[2]   REVIEW OF SYSTEMS (Negative unless checked)  Constitutional: [] Weight loss  [] Fever  [] Chills Cardiac: [] Chest pain   [] Chest pressure   [] Palpitations   [] Shortness of breath when laying flat   [] Shortness of breath at rest   [] Shortness of breath with exertion. Vascular:  [] Pain in legs with walking   [] Pain in legs at rest   [] Pain in legs when laying flat   [] Claudication   [] Pain in  feet when walking  [] Pain in feet at rest  [x] Pain in feet when laying flat   [] History of DVT   [] Phlebitis   [] Swelling in legs   [] Varicose veins   [x] Non-healing ulcers Pulmonary:   [] Uses home oxygen   [] Productive cough   [] Hemoptysis   [] Wheeze  [] COPD   [] Asthma Neurologic:  [] Dizziness  [] Blackouts   [] Seizures   [] History of stroke   [] History of TIA  [] Aphasia   [] Temporary blindness   [] Dysphagia   [] Weakness or numbness in arms   [x] Weakness or numbness in legs Musculoskeletal:  [] Arthritis   [] Joint swelling   [] Joint pain   [] Low back pain Hematologic:  [] Easy bruising  [] Easy bleeding   [] Hypercoagulable state   [] Anemic  [] Hepatitis Gastrointestinal:  [] Blood in stool   [] Vomiting  blood  [] Gastroesophageal reflux/heartburn   [] Difficulty swallowing. Genitourinary:  [] Chronic kidney disease   [] Difficult urination  [] Frequent urination  [] Burning with urination   [] Blood in urine Skin:  [] Rashes   [] Ulcers   [] Wounds Psychological:  [] History of anxiety   []  History of major depression.  Physical Examination  Vitals:   08/24/24 1348 08/24/24 1349 08/24/24 1415 08/24/24 1430  BP: (!) 78/40 (!) 144/80  130/71  Pulse:   100 97  Resp:   13 16  Temp:      TempSrc:      SpO2:   94% 100%  Weight:      Height:       Body mass index is 23.06 kg/m. Gen:  WD/WN, NAD Head: [place]/AT, No temporalis wasting. Prominent temp pulse not noted. Ear/Nose/Throat: Hearing grossly intact, nares w/o erythema or drainage, oropharynx w/o Erythema/Exudate Eyes: Sclera non-icteric, conjunctiva clear Neck: Trachea midline.  No JVD.  Pulmonary:  Good air movement, respirations not labored, equal bilaterally.  Cardiac: RRR, normal S1, S2. Vascular: Wound on the subfifth left foot.  Left foot cooler with ruddy appearance Vessel Right Left  PT Not Palpable Not Palpable  DP Trace Palpable Not Palpable   Gastrointestinal: soft, non-tender/non-distended. No guarding/reflex.  Musculoskeletal: M/S 5/5  throughout.  Extremities without ischemic changes.  No deformity or atrophy.  1+ edema on left lower extremity Neurologic: Sensation grossly intact in extremities.  Symmetrical.  Speech is fluent. Motor exam as listed above.  Tremors Psychiatric: Judgment intact, Mood & affect appropriate for pt's clinical situation. Lymph : No Cervical, Axillary, or Inguinal lymphadenopathy.    CBC Lab Results  Component Value Date   WBC 15.4 (H) 08/24/2024   HGB 10.6 (L) 08/24/2024   HCT 31.9 (L) 08/24/2024   MCV 101.9 (H) 08/24/2024   PLT 283 08/24/2024    BMET    Component Value Date/Time   NA 131 (L) 08/24/2024 1407   K 4.3 08/24/2024 1407   CL 93 (L) 08/24/2024 1407   CO2 25 08/24/2024 1407   GLUCOSE 120 (H) 08/24/2024 1407   BUN 16 08/24/2024 1407   CREATININE 0.56 (L) 08/24/2024 1407   CALCIUM  9.3 08/24/2024 1407   GFRNONAA >60 08/24/2024 1407   GFRAA >60 03/18/2017 0925   Estimated Creatinine Clearance: 88.3 mL/min (A) (by C-G formula based on SCr of 0.56 mg/dL (L)).  COAG Lab Results  Component Value Date   INR 1.1 08/24/2024    Radiology DG Foot Complete Left Result Date: 08/24/2024 EXAM: 3 OR MORE VIEW(S) XRAY OF THE LEFT FOOT 08/24/2024 02:19:00 PM COMPARISON: None available. CLINICAL HISTORY: Ischemic left foot, cellulitis. Nonhealing ulcer laterally over the fifth metatarsal. Evaluate signs of osteomyelitis. FINDINGS: BONES AND JOINTS: No acute fracture. No malalignment. No underlying bone erosion to suggest osteomyelitis. Degenerative changes in the intertarsal joints. SOFT TISSUES: Vascular calcifications. Mild soft tissue edema about the lateral foot. Soft tissue swelling with focal soft tissue defect over the 5th metatarsal head. IMPRESSION: 1. No evidence of osteomyelitis. 2. Soft tissue swelling with focal soft tissue defect over the fifth metatarsal head. 3. Mild soft tissue edema about the lateral foot. 4. Vascular calcifications. Electronically signed by: Elsie Gravely MD 08/24/2024 03:08 PM EST RP Workstation: [id number]      Assessment/Plan 1.  Atherosclerosis obliterans with ulceration  Today the patient bedside ABI was 0.54.  Based upon much of the history the patient is describing rest pain that has been going on for the last several weeks and worsening.  Based on his worsening wound it would be in his best interest to move forward with an angiogram.  I have discussed with the wife and patient that not intervention could lead to worsening outcomes up to and including amputation.  We have discussed the risk, benefits and alternatives of the procedure and they are agreeable to move forward with intervention.  We will plan on angiogram of the left lower extremity on 08/25/2024.  Recommended the patient be placed on heparin  in the interim.  2.  Left foot wound Currently seeing [facility] clinic podiatry outpatient.  Would recommend podiatry consult while inpatient to evaluate if any intervention is necessary while he is in-house    Plan of care discussed with Dr.Dew and he is in agreement with plan noted above.   Family Communication: Wife at bedside   I spent 75 minutes in this encounter including personally reviewing extensive medical records, personally reviewing imaging studies and compared to prior scans, counseling the patient, placing orders, coordinating care and performing appropriate documentation  Thank you for allowing us  to participate in the care of this patient.   Devondre Guzzetta E Antaniya Venuti, NP [facility] [phone] (Office Phone) [phone] (Office Fax) [phone] (Pager)  08/24/2024 3:11 PM  Staff may message me via secure chat in Epic  but this may not receive immediate response,  please page for urgent matters!  Dictation software was used to generate the above note. Typos may occur and escape review, as with typed/written notes. Any error is purely unintentional.  Please contact me directly for  clarity if needed.      [1]  Social History Tobacco Use   Smoking status: Former    Current packs/day: 0.00    Types: Cigarettes    Start date: 10/1965    Quit date: 10/2015    Years since quitting: 8.8    Passive exposure: Past   Smokeless tobacco: Never  Vaping Use   Vaping status: Never Used  Substance Use Topics   Alcohol use: Yes    Alcohol/week: 7.0 standard drinks of alcohol    Types: 7 Shots of liquor per week   Drug use: No  [2] No Known Allergies  "

## 2024-08-24 NOTE — Consult Note (Signed)
 PHARMACY - ANTICOAGULATION CONSULT NOTE  Pharmacy Consult for heparin  dosing Indication: critical limb ischemia  Allergies[1]  Patient Measurements: Height: 6' (182.9 cm) Weight: 77.1 kg (170 lb) IBW/kg (Calculated) : 77.6 HEPARIN  DW (KG): 77.1  Vital Signs: Temp: 98.6 F (37 C) (01/27 1326) Temp Source: Oral (01/27 1326) BP: 130/71 (01/27 1430) Pulse Rate: 97 (01/27 1430)  Labs: Recent Labs    08/24/24 1407  HGB 10.6*  HCT 31.9*  PLT 283  APTT 36  LABPROT 14.6  INR 1.1  CREATININE 0.56*    Estimated Creatinine Clearance: 88.3 mL/min (A) (by C-G formula based on SCr of 0.56 mg/dL (L)).   Medical History: Past Medical History:  Diagnosis Date   Compression fracture of first cervical vertebra (HCC)    Essential tremor    Ileus (HCC)     Medications:  No PTA anticoagulation  Assessment: Nicolas Solis is a 65 yoM presenting with concerns for critical limb ischemia of left foot. Past medical history is notable for coronary artery disease, Parkinson's disease, dementia, and abdominal aortic aneurysm. Patient had been complaining of worsening left foot pain, with significant pain at rest that worsens with movement. Physical exam noted for erythema of left lower extremity. ABI of left leg 0.54. Imaging of left foot from 1/27 with no evidence of osteomyelitis. Soft tissue swelling..mild soft tissue edema.  Left lower extremity ultrasound has been ordered, with angiography planned for 1/28.  Baseline hgb 10.6, plt 283, INR 1.1, and aPTT 36.  Goal of Therapy:  Heparin  level 0.3-0.7 units/ml Monitor platelets by anticoagulation protocol: Yes   Plan:  Will follow ACS dosing for bolus and VTE dosing for infusion rate Give 4000 units bolus x 1, and start heparin  infusion at 1200 units/hr Check anti-Xa level in 8 hours, and daily while on heparin  Continue to monitor H&H and platelets  Nicolas Solis Nicolas Solis 08/24/2024,3:44 PM      [1] No Known Allergies

## 2024-08-24 NOTE — ED Provider Notes (Signed)
 "  [facility] Provider Note    Event Date/Time   First MD Initiated Contact with Patient 08/24/24 1331     (approximate)   History   Wound Infection   HPI  Nicolas Solis is a 75 y.o. male who presents to the ED for evaluation of Wound Infection   Reviewed podiatric clinic visit from 2 weeks ago.  History of Parkinson's disease, dementia, CAD.   Patient presents with wife due to worsening left foot pain, ulcerative lesion.  Significant pain at rest that worsens with ambulation.  No fevers or systemic symptoms   Physical Exam   Triage Vital Signs: ED Triage Vitals [08/24/24 1326]  Encounter Vitals Group     BP (!) 142/98     Girls Systolic BP Percentile      Girls Diastolic BP Percentile      Boys Systolic BP Percentile      Boys Diastolic BP Percentile      Pulse Rate (!) 104     Resp 20     Temp 98.6 F (37 C)     Temp Source Oral     SpO2      Weight 170 lb (77.1 kg)     Height 6' (1.829 m)     Head Circumference      Peak Flow      Pain Score 9     Pain Loc      Pain Education      Exclude from Growth Chart     Most recent vital signs: Vitals:   08/24/24 1349 08/24/24 1415  BP: (!) 144/80   Pulse:  100  Resp:  13  Temp:    SpO2:  94%    General: Awake, no distress.  CV:  Good peripheral perfusion.  Resp:  Normal effort.  Abd:  No distention.  MSK:  No deformity noted.  Neuro:  No focal deficits appreciated. Other:  Lower extremities pictured below.  Obvious swelling erythema to the distal left lower extremity from the mid shin down.  Toes are cool to the touch and has very slow capillary refill, cannot confidently palpate a DP pulse on the left, very faint on the right.  Ulcerative lesion laterally over the fifth metatarsal without purulence.  ABI in the left leg is fairly low at 0.54.     ED Results / Procedures / Treatments   Labs (all labs ordered are listed, but only abnormal results are displayed) Labs  Reviewed  CBC WITH DIFFERENTIAL/PLATELET - Abnormal; Notable for the following components:      Result Value   WBC 15.4 (*)    RBC 3.13 (*)    Hemoglobin 10.6 (*)    HCT 31.9 (*)    MCV 101.9 (*)    Neutro Abs 13.1 (*)    Abs Immature Granulocytes 0.19 (*)    All other components within normal limits  CULTURE, BLOOD (SINGLE)  PROTIME-INR  APTT  LACTIC ACID, PLASMA  PROCALCITONIN  BASIC METABOLIC PANEL WITH GFR    EKG   RADIOLOGY   Official radiology report(s): No results found.  PROCEDURES and INTERVENTIONS:  .Critical Care  Performed by: Claudene Rover, MD Authorized by: Claudene Rover, MD   Critical care provider statement:    Critical care time (minutes):  30   Critical care time was exclusive of:  Separately billable procedures and treating other patients   Critical care was necessary to treat or prevent imminent or life-threatening deterioration of the following  conditions:  Circulatory failure and sepsis   Critical care was time spent personally by me on the following activities:  Development of treatment plan with patient or surrogate, discussions with consultants, evaluation of patient's response to treatment, examination of patient, ordering and review of laboratory studies, ordering and review of radiographic studies, ordering and performing treatments and interventions, pulse oximetry, re-evaluation of patient's condition and review of old charts   Medications  vancomycin  (VANCOREADY) IVPB 1750 mg/350 mL (1,750 mg Intravenous New Bag/Given 08/24/24 1451)  heparin  ADULT infusion 100 units/mL (25000 units/250mL) (has no administration in time range)  morphine  (PF) 4 MG/ML injection 4 mg (4 mg Intravenous Given 08/24/24 1413)  heparin  injection 4,000 Units (4,000 Units Intravenous Given 08/24/24 1414)  piperacillin -tazobactam (ZOSYN ) IVPB 3.375 g (0 g Intravenous Stopped 08/24/24 1447)     IMPRESSION / MDM / ASSESSMENT AND PLAN / ED COURSE  I reviewed the triage  vital signs and the nursing notes.  Differential diagnosis includes, but is not limited to, venous stasis dermatitis, cellulitis, sepsis, necrotizing soft tissue infection, critical limb ischemia, DVT  {Patient presents with symptoms of an acute illness or injury that is potentially life-threatening.  Patient presents with progressive nonhealing ulcer to his left foot and worsening pain concerning for limb ischemia and cellulitis requiring antibiotics, heparinization and medical admission.  Poor circulation to the left foot, pictured above.  Elevated heart rate and WBC are SIRS criteria and likely degree of infection/cellulitis.  Draw cultures and started on antibiotics, heparin .  Consult with vascular surgery and medicine for admission.  Clinical Course as of 08/24/24 1455  Tue Aug 24, 2024  1356 ABI 78/144 - 0.54 [DS]  1400 I consult with vascular surgery who agrees with heparinization and they will see this afternoon for planned angiography tomorrow morning [DS]    Clinical Course User Index [DS] Claudene Rover, MD     FINAL CLINICAL IMPRESSION(S) / ED DIAGNOSES   Final diagnoses:  Critical limb ischemia of left lower extremity (HCC)  Cellulitis of left lower extremity     Rx / DC Orders   ED Discharge Orders     None        Note:  This document was prepared using Dragon voice recognition software and may include unintentional dictation errors.   Claudene Rover, MD 08/24/24 1455  "

## 2024-08-24 NOTE — ED Triage Notes (Signed)
 First nurse note: pt to ED from Dr Clemencia office, concern for ulcer and decreased circulation to left foot

## 2024-08-24 NOTE — ED Triage Notes (Signed)
 Pt has multiple wounds to his left foot. He presents today with erythema and swelling extending up to calf. PCP sent him to ER concerned for vascular compromise and further evaluation.

## 2024-08-25 ENCOUNTER — Encounter: Admission: EM | Payer: Self-pay | Source: Home / Self Care | Attending: Emergency Medicine

## 2024-08-25 ENCOUNTER — Encounter: Payer: Self-pay | Admitting: Emergency Medicine

## 2024-08-25 DIAGNOSIS — L97529 Non-pressure chronic ulcer of other part of left foot with unspecified severity: Secondary | ICD-10-CM

## 2024-08-25 LAB — CBC
HCT: 28.4 % — ABNORMAL LOW (ref 39.0–52.0)
HCT: 31.4 % — ABNORMAL LOW (ref 39.0–52.0)
Hemoglobin: 9.5 g/dL — ABNORMAL LOW (ref 13.0–17.0)
Hemoglobin: 10.5 g/dL — ABNORMAL LOW (ref 13.0–17.0)
MCH: 33.5 pg (ref 26.0–34.0)
MCH: 33.3 pg (ref 26.0–34.0)
MCHC: 33.5 g/dL (ref 30.0–36.0)
MCHC: 33.4 g/dL (ref 30.0–36.0)
MCV: 100 fL (ref 80.0–100.0)
MCV: 99.7 fL (ref 80.0–100.0)
Platelets: 265 10*3/uL (ref 150–400)
Platelets: 263 10*3/uL (ref 150–400)
RBC: 2.84 MIL/uL — ABNORMAL LOW (ref 4.22–5.81)
RBC: 3.15 MIL/uL — ABNORMAL LOW (ref 4.22–5.81)
RDW: 13.2 % (ref 11.5–15.5)
RDW: 13.2 % (ref 11.5–15.5)
WBC: 12.9 10*3/uL — ABNORMAL HIGH (ref 4.0–10.5)
WBC: 14.1 10*3/uL — ABNORMAL HIGH (ref 4.0–10.5)
nRBC: 0 % (ref 0.0–0.2)
nRBC: 0 % (ref 0.0–0.2)

## 2024-08-25 LAB — HEPARIN LEVEL (UNFRACTIONATED)
Heparin Unfractionated: 0.13 [IU]/mL — ABNORMAL LOW (ref 0.30–0.70)
Heparin Unfractionated: <0.1 [IU]/mL — ABNORMAL LOW (ref 0.30–0.70)

## 2024-08-25 LAB — BASIC METABOLIC PANEL WITH GFR
Anion gap: 14 (ref 5–15)
BUN: 12 mg/dL (ref 8–23)
CO2: 21 mmol/L — ABNORMAL LOW (ref 22–32)
Calcium: 8.3 mg/dL — ABNORMAL LOW (ref 8.9–10.3)
Chloride: 95 mmol/L — ABNORMAL LOW (ref 98–111)
Creatinine, Ser: 0.53 mg/dL — ABNORMAL LOW (ref 0.61–1.24)
GFR, Estimated: >60 mL/min
Glucose, Bld: 107 mg/dL — ABNORMAL HIGH (ref 70–99)
Potassium: 3.7 mmol/L (ref 3.5–5.1)
Sodium: 130 mmol/L — ABNORMAL LOW (ref 135–145)

## 2024-08-25 LAB — FIBRINOGEN: Fibrinogen: 657 mg/dL — ABNORMAL HIGH (ref 210–475)

## 2024-08-25 LAB — MRSA NEXT GEN BY PCR, NASAL: MRSA by PCR Next Gen: NOT DETECTED

## 2024-08-25 MED ORDER — ALTEPLASE 2 MG IJ SOLR
INTRAMUSCULAR | Status: DC | PRN
  Administered 2024-08-25: 10 mg

## 2024-08-25 MED ORDER — MIDAZOLAM HCL 2 MG/ML PO SYRP
8.0000 mg | ORAL_SOLUTION | Freq: Once | ORAL | Status: DC | PRN
  Filled 2024-08-25: qty 5

## 2024-08-25 MED ORDER — CHLORHEXIDINE GLUCONATE CLOTH 2 % EX PADS
6.0000 | MEDICATED_PAD | Freq: Every day | CUTANEOUS | Status: DC
  Administered 2024-08-25 – 2024-08-31 (×5): 6 via TOPICAL

## 2024-08-25 MED ORDER — FENTANYL CITRATE (PF) 50 MCG/ML IJ SOSY
PREFILLED_SYRINGE | INTRAMUSCULAR | Status: AC
  Filled 2024-08-25: qty 1

## 2024-08-25 MED ORDER — FENTANYL CITRATE (PF) 100 MCG/2ML IJ SOLN
INTRAMUSCULAR | Status: AC
  Filled 2024-08-25: qty 2

## 2024-08-25 MED ORDER — SODIUM CHLORIDE 0.9 % IV SOLN
0.5000 mg/h | INTRAVENOUS | Status: DC
  Administered 2024-08-26: 0.5 mg/h
  Filled 2024-08-25 (×4): qty 10

## 2024-08-25 MED ORDER — VANCOMYCIN HCL IN DEXTROSE 1-5 GM/200ML-% IV SOLN
1000.0000 mg | Freq: Two times a day (BID) | INTRAVENOUS | Status: AC
  Administered 2024-08-25 – 2024-08-31 (×12): 1000 mg via INTRAVENOUS
  Filled 2024-08-25 (×13): qty 200

## 2024-08-25 MED ORDER — HEPARIN BOLUS VIA INFUSION
2300.0000 [IU] | Freq: Once | INTRAVENOUS | Status: AC
  Administered 2024-08-25: 2300 [IU] via INTRAVENOUS
  Filled 2024-08-25: qty 2300

## 2024-08-25 MED ORDER — METHYLPREDNISOLONE SODIUM SUCC 125 MG IJ SOLR: 125.0000 mg | Freq: Once | INTRAMUSCULAR | Status: DC | PRN

## 2024-08-25 MED ORDER — MIDAZOLAM HCL 2 MG/2ML IJ SOLN
INTRAMUSCULAR | Status: AC
  Filled 2024-08-25: qty 4

## 2024-08-25 MED ORDER — FAMOTIDINE 20 MG PO TABS: 40.0000 mg | ORAL_TABLET | Freq: Once | ORAL | Status: DC | PRN

## 2024-08-25 MED ORDER — ONDANSETRON HCL 4 MG/2ML IJ SOLN: 4.0000 mg | Freq: Four times a day (QID) | INTRAMUSCULAR | Status: DC | PRN

## 2024-08-25 MED ORDER — SODIUM CHLORIDE 0.9% FLUSH: 3.0000 mL | INTRAVENOUS | Status: DC | PRN

## 2024-08-25 MED ORDER — MIDAZOLAM HCL (PF) 2 MG/2ML IJ SOLN
INTRAMUSCULAR | Status: DC | PRN
  Administered 2024-08-25: .5 mg via INTRAVENOUS
  Administered 2024-08-25: 2 mg via INTRAVENOUS
  Administered 2024-08-25 (×2): .5 mg via INTRAVENOUS

## 2024-08-25 MED ORDER — SODIUM CHLORIDE 0.9% FLUSH
3.0000 mL | Freq: Two times a day (BID) | INTRAVENOUS | Status: DC
  Administered 2024-08-25 – 2024-08-31 (×10): 3 mL via INTRAVENOUS

## 2024-08-25 MED ORDER — ALTEPLASE 2 MG IJ SOLR
INTRAMUSCULAR | Status: AC
  Filled 2024-08-25: qty 10

## 2024-08-25 MED ORDER — HEPARIN (PORCINE) IN NACL 2000-0.9 UNIT/L-% IV SOLN
INTRAVENOUS | Status: DC | PRN
  Administered 2024-08-25: 1000 mL

## 2024-08-25 MED ORDER — SODIUM CHLORIDE 0.9 % IV SOLN: 250.0000 mL | INTRAVENOUS | Status: AC | PRN

## 2024-08-25 MED ORDER — SODIUM CHLORIDE 0.9 % IV SOLN
1.0000 mg/h | INTRAVENOUS | Status: AC
  Administered 2024-08-25: 1 mg/h
  Filled 2024-08-25: qty 10

## 2024-08-25 MED ORDER — DIPHENHYDRAMINE HCL 50 MG/ML IJ SOLN: 50.0000 mg | Freq: Once | INTRAMUSCULAR | Status: DC | PRN

## 2024-08-25 MED ORDER — HEPARIN (PORCINE) 25000 UT/250ML-% IV SOLN
600.0000 [IU]/h | INTRAVENOUS | Status: DC
  Administered 2024-08-25 – 2024-08-26 (×2): 600 [IU]/h via INTRAVENOUS

## 2024-08-25 MED ORDER — HEPARIN (PORCINE) 25000 UT/250ML-% IV SOLN
INTRAVENOUS | Status: AC
  Filled 2024-08-25: qty 250

## 2024-08-25 MED ORDER — HEPARIN SODIUM (PORCINE) 1000 UNIT/ML IJ SOLN
INTRAMUSCULAR | Status: DC | PRN
  Administered 2024-08-25: 4000 [IU] via INTRAVENOUS

## 2024-08-25 MED ORDER — ATORVASTATIN CALCIUM 20 MG PO TABS
20.0000 mg | ORAL_TABLET | Freq: Every day | ORAL | Status: AC
  Administered 2024-08-25 – 2024-09-03 (×10): 20 mg via ORAL
  Filled 2024-08-25 (×10): qty 1

## 2024-08-25 MED ORDER — IODIXANOL 320 MG/ML IV SOLN
INTRAVENOUS | Status: DC | PRN
  Administered 2024-08-25: 45 mL

## 2024-08-25 MED ORDER — HEPARIN SODIUM (PORCINE) 1000 UNIT/ML IJ SOLN
INTRAMUSCULAR | Status: AC
  Filled 2024-08-25: qty 10

## 2024-08-25 MED ORDER — ASPIRIN 81 MG PO TBEC
81.0000 mg | DELAYED_RELEASE_TABLET | Freq: Every day | ORAL | Status: AC
  Administered 2024-08-25 – 2024-09-03 (×10): 81 mg via ORAL
  Filled 2024-08-25 (×10): qty 1

## 2024-08-25 MED ORDER — FENTANYL CITRATE (PF) 100 MCG/2ML IJ SOLN
INTRAMUSCULAR | Status: DC | PRN
  Administered 2024-08-25: 50 ug via INTRAVENOUS
  Administered 2024-08-25 (×3): 25 ug via INTRAVENOUS

## 2024-08-25 MED ORDER — TAMSULOSIN HCL 0.4 MG PO CAPS
0.4000 mg | ORAL_CAPSULE | Freq: Every day | ORAL | Status: DC
  Administered 2024-08-25 – 2024-08-27 (×3): 0.4 mg via ORAL
  Filled 2024-08-25 (×4): qty 1

## 2024-08-25 MED ORDER — SODIUM CHLORIDE 0.9 % IV SOLN: INTRAVENOUS | Status: DC

## 2024-08-25 MED ORDER — LIDOCAINE-EPINEPHRINE (PF) 1 %-1:200000 IJ SOLN
INTRAMUSCULAR | Status: DC | PRN
  Administered 2024-08-25: 20 mL

## 2024-08-25 MED ORDER — HYDROMORPHONE HCL 1 MG/ML IJ SOLN
1.0000 mg | Freq: Once | INTRAMUSCULAR | Status: AC | PRN
  Administered 2024-08-31: 1 mg via INTRAVENOUS
  Filled 2024-08-25: qty 1

## 2024-08-25 MED ORDER — HEPARIN BOLUS VIA INFUSION
2300.0000 [IU] | Freq: Once | INTRAVENOUS | Status: DC
  Filled 2024-08-25: qty 2300

## 2024-08-25 NOTE — Consult Note (Signed)
 PHARMACY - ANTICOAGULATION CONSULT NOTE  Pharmacy Consult for heparin  dosing Indication: critical limb ischemia  Allergies[1]  Patient Measurements: Height: 6' (182.9 cm) Weight: 77.1 kg (170 lb) IBW/kg (Calculated) : 77.6 HEPARIN  DW (KG): 77.1  Vital Signs: Temp: 100.1 F (37.8 C) (01/28 0801) Temp Source: Oral (01/28 0801) BP: 150/82 (01/28 0801) Pulse Rate: 91 (01/28 0801)  Labs: Recent Labs    08/24/24 1407 08/24/24 2341 08/25/24 0529 08/25/24 0816  HGB 10.6*  --  9.5*  --   HCT 31.9*  --  28.4*  --   PLT 283  --  265  --   APTT 36  --   --   --   LABPROT 14.6  --   --   --   INR 1.1  --   --   --   HEPARINUNFRC  --  <0.10*  --  0.13*  CREATININE 0.56*  --  0.53*  --     Estimated Creatinine Clearance: 88.3 mL/min (A) (by C-G formula based on SCr of 0.53 mg/dL (L)).   Medical History: Past Medical History:  Diagnosis Date   Compression fracture of first cervical vertebra (HCC)    Essential tremor    History of hip replacement    Ileus (HCC)     Medications:  No PTA anticoagulation  Assessment: Nicolas Solis is a 54 yoM presenting with concerns for critical limb ischemia of left foot. Past medical history is notable for coronary artery disease, Parkinson's disease, dementia, and abdominal aortic aneurysm. Patient had been complaining of worsening left foot pain, with significant pain at rest that worsens with movement. Physical exam noted for erythema of left lower extremity. ABI of left leg 0.54. Imaging of left foot from 1/27 with no evidence of osteomyelitis. Soft tissue swelling..mild soft tissue edema.  Left lower extremity ultrasound has been ordered, with angiography planned for 1/28.  Baseline hgb 10.6, plt 283, INR 1.1, and aPTT 36.  Date Time HL Rate/Comment 01/27 2341 <0.10 Subtherapeutic on 1200 units/hour 01/28 0816 0.13 Subtherapeutic on 1500 units/hour  Goal of Therapy:  Heparin  level 0.3-0.7 units/ml Monitor platelets by  anticoagulation protocol: Yes   Plan:  HL remains subtherapeutic, CBC stable Give heparin  bolus of 2300 units x1 Increase heparin  infusion to 1800 units/hour Recheck HL 8 hrs after rate change  Continue to monitor H&H and platelets  Thank you for involving pharmacy in this patient's care.   Damien Napoleon, PharmD Clinical Pharmacist 08/25/2024 10:55 AM    [1] No Known Allergies

## 2024-08-25 NOTE — Progress Notes (Signed)
 " Progress Note   Patient: Nicolas Solis FMW:[id number] DOB: August 20, 1950 DOA: 08/24/2024     1 DOS: the patient was seen and examined on 08/25/2024   Brief hospital course: From HPI Nicolas Solis is a 74 y.o. male with medical history significant of coronary artery disease, Parkinson's disease presenting to the emergency department for evaluation of worsening pain, swelling, and discoloration of his left foot.  History is per his wife, present at bedside.  He has had numbness and tingling of his left leg from the knee throughout his foot since December.  At some point he developed an ulceration at the base of his fifth toe on the left that has been increasing in size for the past few weeks. No known injury or drainage from the wound. He started having chills 2 nights ago. Denies other systemic symptoms.     He's taken a course of antibiotics and was prescribed gabapentin  without improvement.    He saw Podiatry about two weeks ago and was referred to Vascular Surgery but has not yet seen them. He was in for follow up with PCP today and was referred to the ED given concern for acute limb ischemia    In the ED he met SIRS criteria with WBC and tachycardia. BP soft initially but fluid responsive.  XR left foot without evidence of OM. Bedside ABI  0.54.  Vascular surgery was consulted.  He was started on heparin  drip and antibiotics with plan for angiography tomorrow. Hospitalist consulted for admission     Assessment and Plan:   Atherosclerosis obliterans with ulceration  Continue heparin  Vascular surgery planning angiogram today Podiatry following   SIRS Left foot ulceration with surrounding cellulitis  - leukocytosis and tachycardia, otherwise hemodynamically stable Continue current antibiotics Vascular surgeon and podiatry on board   Acute on chronic macrocytic anemia  - Hgb slowly owndrending from 12.5 in December 2025  - f/u folate, B12 - monitor/transfuse as indicated     Hyponatremia, mild  Sodium level improving   Parkinson's disease  Dementia  - Resume home medications when reconciled  - delirium precautions    CAD HLD Continue home statin therapy     Advance Care Planning:   Code Status: Full Code  Discussed with patient at time of admission.    Consults: Vascular Surgery    Family Communication: Wife at bedside     Subjective:  Patient seen and examined at bedside this morning Denies nausea vomiting abdominal pain chest pain Still has some pain involving the left leg Vascular surgery planning intervention today  Physical Exam:  General: He is not in acute distress.    Appearance: He is not toxic-appearing.  Eyes:     Extraocular Movements: Extraocular movements intact.     Pupils: Pupils are equal, round, and reactive to light.  Cardiovascular:     Rate and Rhythm: Normal rate and regular rhythm.  Pulmonary:     Effort: Pulmonary effort is normal.     Breath sounds: Normal breath sounds.  Abdominal:     General: There is no distension.     Palpations: Abdomen is soft.     Tenderness: There is no abdominal tenderness.  Musculoskeletal:    Dressing noted to the left leg appears clean and dry Neurological: Tremors noted to the left upper extremity   Data Reviewed:    Latest Ref Rng & Units 08/25/2024    5:48 PM 08/25/2024    5:29 AM 08/24/2024    2:07 PM  CBC  WBC 4.0 - 10.5 K/uL 14.1  12.9  15.4   Hemoglobin 13.0 - 17.0 g/dL 89.4  9.5  89.3   Hematocrit 39.0 - 52.0 % 31.4  28.4  31.9   Platelets 150 - 400 K/uL 263  265  283        Latest Ref Rng & Units 08/25/2024    5:29 AM 08/24/2024    2:07 PM 10/11/2022    8:20 AM  BMP  Glucose 70 - 99 mg/dL 892  879  895   BUN 8 - 23 mg/dL 12  16  14    Creatinine 0.61 - 1.24 mg/dL 9.46  9.43  9.50   Sodium 135 - 145 mmol/L 130  131  127   Potassium 3.5 - 5.1 mmol/L 3.7  4.3  4.0   Chloride 98 - 111 mmol/L 95  93  92   CO2 22 - 32 mmol/L 21  25  23    Calcium  8.9 - 10.3 mg/dL 8.3   9.3  8.9     Vitals:   08/25/24 1600 08/25/24 1614 08/25/24 1615 08/25/24 1700  BP: (!) 147/69 (!) 146/77 (!) 146/77 (!) 160/87  Pulse: 88 84 86 85  Resp: 19 18 17  (!) 25  Temp:  98 F (36.7 C)    TempSrc:  Oral    SpO2: 94%  94% 100%  Weight:      Height:         Author: Drue ONEIDA Potter, MD 08/25/2024 6:00 PM  For on call review [email or web address].  "

## 2024-08-25 NOTE — Progress Notes (Signed)
 OT Cancellation Note  Patient Details Name: Nicolas Solis MRN: [id number] DOB: [birth year]   Cancelled Treatment:    Reason Eval/Treat Not Completed: Patient at procedure or test/ unavailable (upon OT entry, transport present to take patient down for angiogram) OT to follow up when patient available  Maryelizabeth CHRISTELLA Clause 08/25/2024, 1:58 PM

## 2024-08-25 NOTE — Op Note (Signed)
 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  Ultrasound guidance for vascular access right femoral artery             2.  Catheter placement into left anterior tibial artery from right femoral approach             3.  Aortogram and selective left lower extremity angiogram             4.  Percutaneous transluminal angioplasty of the left anterior tibial artery with 3 mm diameter angioplasty balloon for occlusion/complex lesion             5.  Percutaneous transluminal angioplasty of the left SFA and popliteal arteries with 3 mm diameter angioplasty balloon for occlusion/complex lesion  6.  Placement of a 135 cm total length 50 cm working length thrombolytic catheter with instillation of 10 mg of tPA at the time of the procedure from the mid SFA down through the popliteal artery and into the proximal anterior tibial artery             7.  Ultrasound guidance vascular access right femoral vein  8.   Placement of right femoral venous triple-lumen catheter  EBL: 10 cc  Contrast: 45 cc  Fluoro Time: 18.1 minutes  Moderate Conscious Sedation Time: approximately 61 minutes using 3.5 mg of Versed  and 125 mcg of Fentanyl               Indications:  Patient is a 73 y.o.male with a profoundly ischemic left lower extremity. The patient is brought in for angiography for further evaluation and potential treatment.  Due to the limb threatening nature of the situation, angiogram was performed for attempted limb salvage. The patient is aware that if the procedure fails, amputation would be  expected.  The patient also understands that even with successful revascularization, amputation may still be required due to the severity of the situation.  Risks and benefits are discussed and informed consent is obtained.   Procedure:  The patient was identified and appropriate procedural time out was performed.  The patient was then placed supine on the table and prepped and draped in the usual sterile fashion. Moderate conscious sedation was administered during a face to face encounter with the patient throughout the procedure with my supervision of the RN administering medicines and monitoring the patient's vital signs, pulse oximetry, telemetry and mental status throughout from the start of the procedure until the patient was taken to the recovery room. Ultrasound was used to evaluate the right common femoral artery.  It was patent .  A digital ultrasound image was acquired.  A Seldinger needle was used to access the right common femoral artery under direct ultrasound guidance and a permanent image was performed.  A 0.035 J wire was advanced without resistance and a 5Fr sheath was placed.  Pigtail catheter was placed into the aorta and an AP aortogram was performed. This demonstrated normal renal arteries.  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  This was associated with marked tortuosity and significant atherosclerosis but no clear hemodynamically  significant stenosis in either iliac system.  I then crossed the aortic bifurcation and advanced to the left femoral head and then into the proximal left SFA.  This was tedious due to the aneurysms in the iliac arteries and the marked tortuosity and required a glide catheter and an advantage wire to be able to get up and over for left leg imaging.. Selective left lower extremity angiogram was then performed. This demonstrated extremely poor flow with severe disease.  There was significant atherosclerotic occlusive disease with  heavy calcification of the proximal and mid SFA.  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  The distal reconstitution was faint.  There appeared to be reconstitution of a peroneal and anterior tibial artery although both vessels had heavy atherosclerotic occlusive disease and appeared to be occluded in the proximal segments from the atherosclerotic occlusive disease more than the aneurysmal disease although thrombosis from the aneurysmal disease could be contributing as well.  This was a dire and clearly limb threatening situation. It was felt that it was in the patient's best interest to proceed with intervention after these images to avoid a second procedure and a larger amount of contrast and fluoroscopy based off of the findings from the initial angiogram. The patient was systemically heparinized and a 6 French Ansell sheath was then placed over the [employer] wire.  This was tedious and difficult due to the marked tortuosity as well as the aneurysmal dilatation of the iliac arteries particularly.  Ultimately, I was able to get a 6 French Ansell sheath into the proximal SFA.  I then used a Kumpe catheter and the advantage wire to navigate down into the combination of severe atherosclerotic occlusive disease and thrombosed popliteal aneurysm.  This was an extremely tedious and difficult task due to the long segment occlusion, the marked tortuosity in the vessel, as well as the large aneurysm making it difficult to find our way distally.  Eventually, I exchanged for a 0.014 command wire and a CXI catheter.  I was able to get across the popliteal aneurysm and down into the most distal popliteal artery where selective imaging showed occlusion of the proximal portion of both the peroneal and anterior tibial arteries.  I then tediously was able to get down  into the anterior tibial artery with the command wire and CXI catheter and confirm intraluminal flow in the proximal to mid anterior tibial artery.  It was clear this was going to need thrombolytic therapy to have any chance of restoration of patency and limb salvage.  However, due to the severe atherosclerotic occlusive disease in both the SFA, popliteal artery, and anterior tibial artery I could not get the lysis catheter to track over the wire and this required angioplasty of these vessels.  This was separate to the popliteal aneurysmal issues.  A 3 mm diameter by 30 cm length angioplasty balloon was then brought onto the field.  This was taken down to the proximal to mid anterior tibial artery initially and inflated to 12 atm for 1 minute.  It was then pulled back into the SFA and popliteal artery and inflated to 14 atm for 1 minute.  There remained continued occlusion throughout these vessels, but I was now able to get a 135 cm total length 50 cm working length thrombolytic catheter down with the distal tip in the proximal anterior  tibial artery and the proximal extent in the mid SFA encompassing the entirety of the occlusion and thrombosis.  10 mg of tPA were then instilled in the catheter and the catheter and sheath were secured in place for continuous thrombolytic therapy overnight.  With continuous thrombolytic therapy, a central line was placed.  As the right groin was prepped, the right femoral vein was visualized with ultrasound and found to be widely patent.  It was then accessed under direct ultrasound guidance without difficulty with a Seldinger needle and a permanent image was recorded.  A J-wire was placed.  After skin nick dilatation a triple-lumen catheter was placed over the wire and the wire was removed.  All 3 lines of the triple-lumen catheter withdrew dark red blood and flushed easily with heparinized saline.  It was then secured to the skin with 2 silk sutures. I elected to terminate the  procedure. The patient was taken to the recovery room in stable condition having tolerated the procedure well.  Findings:               Aortogram:  This demonstrated normal renal arteries.  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  This was associated with marked tortuosity and significant atherosclerosis but no clear hemodynamically significant stenosis in either iliac system.             Left lower Extremity:  This demonstrated extremely poor flow with severe disease.  There was significant atherosclerotic occlusive disease with heavy calcification of the proximal and mid SFA.  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  The distal reconstitution was faint.  There appeared to be reconstitution of a peroneal and anterior tibial artery although both vessels had heavy atherosclerotic occlusive disease and appeared to be occluded in the proximal segments from the atherosclerotic occlusive disease more than the aneurysmal disease although thrombosis from the aneurysmal disease could be contributing as well.  This was a dire and clearly limb threatening situation   Disposition: Patient was taken to the recovery room in stable condition having tolerated the procedure well.  Complications: None  Selinda Gu 08/25/2024 3:41 PM   This note was created with Dragon Medical transcription system. Any errors in dictation are purely unintentional.

## 2024-08-25 NOTE — Consult Note (Addendum)
 PODIATRY / FOOT AND ANKLE SURGERY CONSULTATION NOTE  Requesting Physician: Dr. Collie  Reason for consult: L foot gangrene   HPI: Nicolas Solis is a 74 y.o. male who presented to [facility] yesterday afternoon due to worsening left foot pain with darkening discoloration to the left foot.  Patient had significant pain with any ambulation but also when at rest.  Patient was seen 2 weeks ago by Dr. Tanda in an outpatient setting and was noted to have some early signs of gangrene to the area and was noted to have critical limb ischemia.  Patient was instructed to follow-up with vascular surgeon as soon as possible.  Things have worsened since that time and patient's pain has gotten worse as well as discoloration of the foot so he came to the hospital.  Patient was subsequently admitted due to gangrene of left foot as well as limb ischemia.  Vascular surgery is planning CT angiogram with intervention today.  Patient presents today resting in bed but does have significant pain to the left foot and ankle.  Patient's wife is at bedside and seems to be surprised by status of patient's foot today.  PMHx:  Past Medical History:  Diagnosis Date   Compression fracture of first cervical vertebra (HCC)    Essential tremor    History of hip replacement    Ileus (HCC)     Surgical Hx:  Past Surgical History:  Procedure Laterality Date   25 GAUGE PARS PLANA VITRECTOMY WITH 20 GAUGE MVR PORT Right 03/18/2017   25 GAUGE PARS PLANA VITRECTOMY WITH 20 GAUGE MVR PORT FOR MACULAR HOLE Right 03/18/2017   Procedure: 25 GAUGE PARS PLANA VITRECTOMY WITH 20 GAUGE MVR PORT FOR MACULAR HOLE; PHOTOCOAGULATION RIGHT EYE;  Surgeon: Alvia Norleen BIRCH, MD;  Location: [facility] OR;  Service: Ophthalmology;  Laterality: Right;   APPENDECTOMY     COLONOSCOPY W/ POLYPECTOMY     GAS INSERTION Right 03/18/2017   Procedure: INSERTION OF GAS RIGHT (C3F8);  Surgeon: Alvia Norleen BIRCH, MD;  Location: [facility] OR;  Service: Ophthalmology;  Laterality:  Right;   GAS/FLUID EXCHANGE Right 03/18/2017   Procedure: GAS/FLUID EXCHANGE RIGHT EYE;  Surgeon: Alvia Norleen BIRCH, MD;  Location: [facility] OR;  Service: Ophthalmology;  Laterality: Right;   JOINT REPLACEMENT Right    Hip   LEFT HEART CATH AND CORONARY ANGIOGRAPHY N/A 02/06/2022   Procedure: LEFT HEART CATH AND CORONARY ANGIOGRAPHY;  Surgeon: Darron Deatrice LABOR, MD;  Location: [facility] INVASIVE CV LAB;  Service: Cardiovascular;  Laterality: N/A;   MEMBRANE PEEL Right 03/18/2017   Procedure: MEMBRANE PEEL RIGHT EYE;  Surgeon: Alvia Norleen BIRCH, MD;  Location: [facility] OR;  Service: Ophthalmology;  Laterality: Right;   MOUTH SURGERY     PHOTOCOAGULATION Left 03/18/2017   Procedure: PHOTOCOAGULATION LEFT EYE;  Surgeon: Alvia Norleen BIRCH, MD;  Location: [facility] OR;  Service: Ophthalmology;  Laterality: Left;   SERUM PATCH Right 03/18/2017   Procedure: SERUM PATCH RIGHT EYE;  Surgeon: Alvia Norleen BIRCH, MD;  Location: [facility] OR;  Service: Ophthalmology;  Laterality: Right;    FHx:  Family History  Problem Relation Age of Onset   Stroke Brother     Social History:  reports that he quit smoking about 8 years ago. His smoking use included cigarettes. He started smoking about 58 years ago. He has been exposed to tobacco smoke. He has never used smokeless tobacco. He reports current alcohol use of about 7.0 standard drinks of alcohol per week. He reports that he does not  use drugs.  Allergies: Allergies[1]  Medications Prior to Admission  Medication Sig Dispense Refill   acetaminophen  (TYLENOL ) 500 MG tablet Take 1,000 mg by mouth every 6 (six) hours as needed (for pain.).     aspirin  EC 81 MG tablet Take 1 tablet (81 mg total) by mouth daily. Swallow whole. 90 tablet 3   atorvastatin  (LIPITOR) 20 MG tablet Take 1 tablet (20 mg total) by mouth daily. 90 tablet 3   carbidopa -levodopa  (SINEMET  IR) 25-100 MG tablet Take 2 tablets by mouth 3 (three) times daily before meals.     Carbidopa -Levodopa  ER (SINEMET  CR) 25-100 MG tablet  controlled release Take 1 tablet by mouth at bedtime.     Cholecalciferol  (VITAMIN D3) 50 MCG (2000 UT) TABS Take 2,000 Units by mouth in the morning.     Cyanocobalamin  (VITAMIN B-12) 5000 MCG LOZG Take 2,500 mcg by mouth every evening.     gabapentin  (NEURONTIN ) 100 MG capsule Take 100 mg by mouth 2 (two) times daily.     midodrine  (PROAMATINE ) 2.5 MG tablet Take 1 tablet (2.5 mg total) by mouth 3 (three) times daily with meals. 270 tablet 2   Multiple Vitamins-Minerals (ADULT ONE DAILY GUMMIES PO) Take 2 tablets by mouth every evening.     Omega-3 Fatty Acids (FISH OIL PO) Take 1,000 mg by mouth in the morning.     ranolazine  (RANEXA ) 500 MG 12 hr tablet TAKE ONE TABLET BY MOUTH TWICE DAILY 180 tablet 3   tamsulosin  (FLOMAX ) 0.4 MG CAPS capsule Take 0.4 mg by mouth daily after supper.     doxycycline (VIBRAMYCIN) 100 MG capsule Take 100 mg by mouth 2 (two) times daily.      Physical Exam: General: Alert and oriented.  No apparent distress.  Vascular: DP/PT pulses not palpable to the left side, difficult to palpate also on the right side, capillary fill time appears to be absent to the 4th and 5th toes left foot, ischemic changes present to the fifth metatarsal phalangeal joint area extending to the fourth metatarsal phalange joint area as well, erythema present surrounding these areas with edema that extends to the midshaft of the metatarsal phalangeal joints.  Neuro: Light touch sensation intact bilateral lower extremities.  Derm: Dry gangrene present to the left fifth metatarsal phalangeal joint area, fifth digit, fourth digit, dusky changes present to the dorsal and plantar aspects of the forefoot with associated erythema and edema that extends to the midfoot, overall edema appears to be worse on the left side than the right side in general with increased erythema.  Small skin fissures present to the posterior aspect left heel, some areas of necrosis to these areas as well as documented  below.         MSK: Pain on palpation left foot  Results for orders placed or performed during the hospital encounter of 08/24/24 (from the past 48 hours)  Protime-INR     Status: None   Collection Time: 08/24/24  2:07 PM  Result Value Ref Range   Prothrombin Time 14.6 11.4 - 15.2 seconds   INR 1.1 0.8 - 1.2    Comment: (NOTE) INR goal varies based on device and disease states. Performed at [facility], [address] [place], [place] [address]   APTT     Status: None   Collection Time: 08/24/24  2:07 PM  Result Value Ref Range   aPTT 36 24 - 36 seconds    Comment: Performed at [facility], [address] [place], [place] [address]  Basic metabolic panel with GFR     Status: Abnormal   Collection Time: 08/24/24  2:07 PM  Result Value Ref Range   Sodium 131 (L) 135 - 145 mmol/L   Potassium 4.3 3.5 - 5.1 mmol/L   Chloride 93 (L) 98 - 111 mmol/L   CO2 25 22 - 32 mmol/L   Glucose, Bld 120 (H) 70 - 99 mg/dL    Comment: Glucose reference range applies only to samples taken after fasting for at least 8 hours.   BUN 16 8 - 23 mg/dL   Creatinine, Ser 9.43 (L) 0.61 - 1.24 mg/dL   Calcium  9.3 8.9 - 10.3 mg/dL   GFR, Estimated >39 >39 mL/min    Comment: (NOTE) Calculated using the CKD-EPI Creatinine Equation (2021)    Anion gap 14 5 - 15    Comment: Performed at [facility], [address] [place], [place] [address]  CBC with Differential/Platelet     Status: Abnormal   Collection Time: 08/24/24  2:07 PM  Result Value Ref Range   WBC 15.4 (H) 4.0 - 10.5 K/uL   RBC 3.13 (L) 4.22 - 5.81 MIL/uL   Hemoglobin 10.6 (L) 13.0 - 17.0 g/dL   HCT 68.0 (L) 60.9 - 47.9 %   MCV 101.9 (H) 80.0 - 100.0 fL   MCH 33.9 26.0 - 34.0 pg   MCHC 33.2 30.0 - 36.0 g/dL   RDW 86.6 88.4 - 84.4 %   Platelets 283 150 - 400 K/uL   nRBC 0.0 0.0 - 0.2 %   Neutrophils Relative % 85 %   Neutro Abs 13.1 (H) 1.7 - 7.7 K/uL   Lymphocytes Relative 7 %   Lymphs Abs 1.0  0.7 - 4.0 K/uL   Monocytes Relative 7 %   Monocytes Absolute 1.0 0.1 - 1.0 K/uL   Eosinophils Relative 0 %   Eosinophils Absolute 0.1 0.0 - 0.5 K/uL   Basophils Relative 0 %   Basophils Absolute 0.1 0.0 - 0.1 K/uL   Immature Granulocytes 1 %   Abs Immature Granulocytes 0.19 (H) 0.00 - 0.07 K/uL    Comment: Performed at [facility], [address] [place], [place] [address]  Lactic acid, plasma     Status: None   Collection Time: 08/24/24  2:07 PM  Result Value Ref Range   Lactic Acid, Venous 1.3 0.5 - 1.9 mmol/L    Comment: Performed at [facility], [address] [place], [place] [address]  Procalcitonin     Status: None   Collection Time: 08/24/24  2:07 PM  Result Value Ref Range   Procalcitonin 0.16 ng/mL    Comment: (NOTE)   Sepsis PCT Algorithm          Lower Respiratory Tract Infection                                         PCT Algorithm -----------------------------------------------------------------  <0.5 ng/mL                    <0.10 ng/mL  Associated with low           Antibiotic therapy strongly   risk for progression          discouraged. Indicates absence   to severe sepsis              of bacteria  infection  and/or septic shock             --------------------------------------------------------------  0.5-2.0 ng/mL                 0.10-0.25 ng/mL  Recommended to retest         Antibiotic therapy discouraged.  PCT within 6-24 hours         Bacterial infection unlikely  ------------------------------------------------------------  >2 ng/mL                      0.26-0.50 ng/mL  Associated with high risk     Antibiotic therapy encouraged.  for progression to severe     Bacterial infection possible  sepsis/and or septic shock    ------------------------------                                 >0.50 ng/mL                                Antibiotic therapy strongly                                 encouraged.                                 Suggestive of presence of                                 bacterial infection.                                 -------------------------------------------------------------------  < or = 0.50 ng/mL OR          < or = 0.25 OR 80% decrease in PCT  80% decrease in PCT           Antibiotic therapy   Antibiotic therapy may        may be discontinued  be discontinued                                 Performed at [facility], [address] [place], [place] [address]   Heparin  level (unfractionated)     Status: Abnormal   Collection Time: 08/24/24 11:41 PM  Result Value Ref Range   Heparin  Unfractionated <0.10 (L) 0.30 - 0.70 IU/mL    Comment: REPEATED TO VERIFY Performed at [facility], [address] [place], [place] [address]   CBC     Status: Abnormal   Collection Time: 08/25/24  5:29 AM  Result Value Ref Range   WBC 12.9 (H) 4.0 - 10.5 K/uL   RBC 2.84 (L) 4.22 - 5.81 MIL/uL   Hemoglobin 9.5 (L) 13.0 - 17.0 g/dL   HCT 71.5 (L) 60.9 - 47.9 %   MCV 100.0 80.0 - 100.0 fL   MCH 33.5 26.0 - 34.0 pg   MCHC 33.5 30.0 - 36.0 g/dL   RDW 86.7 88.4 - 84.4 %   Platelets 265 150 - 400 K/uL   nRBC 0.0 0.0 - 0.2 %  Comment: Performed at [facility], [address] [place], [place] [address]  Basic metabolic panel     Status: Abnormal   Collection Time: 08/25/24  5:29 AM  Result Value Ref Range   Sodium 130 (L) 135 - 145 mmol/L   Potassium 3.7 3.5 - 5.1 mmol/L   Chloride 95 (L) 98 - 111 mmol/L   CO2 21 (L) 22 - 32 mmol/L   Glucose, Bld 107 (H) 70 - 99 mg/dL    Comment: Glucose reference range applies only to samples taken after fasting for at least 8 hours.   BUN 12 8 - 23 mg/dL   Creatinine, Ser 9.46 (L) 0.61 - 1.24 mg/dL   Calcium  8.3 (L) 8.9 - 10.3 mg/dL   GFR, Estimated >39 >39 mL/min    Comment: (NOTE) Calculated using the CKD-EPI Creatinine Equation (2021)    Anion gap 14 5 - 15    Comment: Performed at [facility], [address] [place], [place] [address]  Heparin  level (unfractionated)     Status: Abnormal   Collection Time: 08/25/24  8:16 AM  Result Value Ref Range   Heparin  Unfractionated 0.13 (L) 0.30 - 0.70 IU/mL    Comment: (NOTE) The clinical reportable range upper limit is being lowered to >1.10 to align with the FDA approved guidance for the current laboratory assay.  If heparin  results are below expected values, and patient dosage has  been confirmed, suggest follow up testing of antithrombin III levels. Performed at [facility], [address] [place], [place] [address]    DG Foot Complete Left Result Date: 08/24/2024 EXAM: 3 OR MORE VIEW(S) XRAY OF THE LEFT FOOT 08/24/2024 02:19:00 PM COMPARISON: None available. CLINICAL HISTORY: Ischemic left foot, cellulitis. Nonhealing ulcer laterally over the fifth metatarsal. Evaluate signs of osteomyelitis. FINDINGS: BONES AND JOINTS: No acute fracture. No malalignment. No underlying bone erosion to suggest osteomyelitis. Degenerative changes in the intertarsal joints. SOFT TISSUES: Vascular calcifications. Mild soft tissue edema about the lateral foot. Soft tissue swelling with focal soft tissue defect over the 5th metatarsal head. IMPRESSION: 1. No evidence of osteomyelitis. 2. Soft tissue swelling with focal soft tissue defect over the fifth metatarsal head. 3. Mild soft tissue edema about the lateral foot. 4. Vascular calcifications. Electronically signed by: Elsie Gravely MD 08/24/2024 03:08 PM EST RP Workstation: [id number]    Blood pressure 136/74, pulse 91, temperature 98.3 F (36.8 C), temperature source Temporal, resp. rate 20, height 6' (1.829 m), weight 77.1 kg, SpO2 90%.  Assessment Critical limb ischemia left lower extremity PVD Left foot gangrene  Plan - Patient seen and examined - X-ray imaging reviewed, no evidence for osteomyelitis.  Arterial calcification noted to the foot and ankle. - Clinically patient appears to have  dry stable gangrene to the left forefoot, mostly appears to involve 4th and 5th toes as well as metatarsal phalangeal joint areas, substantial tissue loss present.  Appears to have dusky changes to the remainder of the forefoot. - Discussed great concern with patient and patient's wife in detail.  Discussed that patient is at high risk for limb loss.  If lower extremity revascularization procedure is successful could consider transmetatarsal amputation.  If is not successful then patient may ultimately require BKA or AKA.  Briefly discussed partial ray amputations but patient would have a substantial soft tissue void present that may take a number of months to potentially years to heal and may not heal.  Discussed with patient's current soft tissue envelope believe  that transmetatarsal amputation would be a viable option if revascularization is successful.  Once again patient is at high risk for limb loss and this was discussed in detail. -Appreciate vascular recommendations, plan for CT angiogram today.  Will follow back up again tomorrow after angiogram was performed to review the foot and CTA results and to see if any further vascular intervention is needed.  If circulation is improved enough could consider surgical intervention on Friday. - Betadine wet to dry dressing applied today. - Appreciate medicine recommendations for antibiotic therapy.  Do not believe the antibiotics are likely getting down to the tissues at this time due to gangrene.  May still give prophylactically at this time.  Prentice Lee, DPM 08/25/2024, 2:39 PM         [1] No Known Allergies

## 2024-08-25 NOTE — Plan of Care (Signed)

## 2024-08-25 NOTE — Consult Note (Signed)
 PHARMACY - ANTICOAGULATION CONSULT NOTE  Pharmacy Consult for heparin  dosing Indication: critical limb ischemia  Allergies[1]  Patient Measurements: Height: 6' (182.9 cm) Weight: 77.1 kg (170 lb) IBW/kg (Calculated) : 77.6 HEPARIN  DW (KG): 77.1  Vital Signs: Temp: 98.9 F (37.2 C) (01/27 2014) Temp Source: Oral (01/27 1326) BP: 124/68 (01/27 2014) Pulse Rate: 96 (01/27 2014)  Labs: Recent Labs    08/24/24 1407 08/24/24 2341  HGB 10.6*  --   HCT 31.9*  --   PLT 283  --   APTT 36  --   LABPROT 14.6  --   INR 1.1  --   HEPARINUNFRC  --  <0.10*  CREATININE 0.56*  --     Estimated Creatinine Clearance: 88.3 mL/min (A) (by C-G formula based on SCr of 0.56 mg/dL (L)).   Medical History: Past Medical History:  Diagnosis Date   Compression fracture of first cervical vertebra (HCC)    Essential tremor    Ileus (HCC)     Medications:  No PTA anticoagulation  Assessment: Nicolas Solis is a 75 yoM presenting with concerns for critical limb ischemia of left foot. Past medical history is notable for coronary artery disease, Parkinson's disease, dementia, and abdominal aortic aneurysm. Patient had been complaining of worsening left foot pain, with significant pain at rest that worsens with movement. Physical exam noted for erythema of left lower extremity. ABI of left leg 0.54. Imaging of left foot from 1/27 with no evidence of osteomyelitis. Soft tissue swelling..mild soft tissue edema.  Left lower extremity ultrasound has been ordered, with angiography planned for 1/28.  Baseline hgb 10.6, plt 283, INR 1.1, and aPTT 36.  Goal of Therapy:  Heparin  level 0.3-0.7 units/ml Monitor platelets by anticoagulation protocol: Yes   Plan:  1/27:  HL @ 2341 = < 0.1, SUBtherapeutic - Will order heparin  2300 units IV X 1 bolus and increase drip rate to 1500 units/hr - Recheck HL 8 hrs after rate change  Continue to monitor H&H and platelets  Khan Chura D 08/25/2024,12:37  AM       [1] No Known Allergies

## 2024-08-26 ENCOUNTER — Encounter: Admission: EM | Payer: Self-pay | Source: Home / Self Care | Attending: Emergency Medicine

## 2024-08-26 ENCOUNTER — Inpatient Hospital Stay

## 2024-08-26 ENCOUNTER — Encounter: Payer: Self-pay | Admitting: Vascular Surgery

## 2024-08-26 ENCOUNTER — Ambulatory Visit: Payer: Self-pay | Admitting: Pharmacy Technician

## 2024-08-26 DIAGNOSIS — L97529 Non-pressure chronic ulcer of other part of left foot with unspecified severity: Secondary | ICD-10-CM | POA: Diagnosis not present

## 2024-08-26 LAB — CBC
HCT: 26.6 % — ABNORMAL LOW (ref 39.0–52.0)
HCT: 26 % — ABNORMAL LOW (ref 39.0–52.0)
HCT: 28.4 % — ABNORMAL LOW (ref 39.0–52.0)
Hemoglobin: 8.9 g/dL — ABNORMAL LOW (ref 13.0–17.0)
Hemoglobin: 8.7 g/dL — ABNORMAL LOW (ref 13.0–17.0)
Hemoglobin: 9.3 g/dL — ABNORMAL LOW (ref 13.0–17.0)
MCH: 33.2 pg (ref 26.0–34.0)
MCH: 33.3 pg (ref 26.0–34.0)
MCH: 33.2 pg (ref 26.0–34.0)
MCHC: 33.5 g/dL (ref 30.0–36.0)
MCHC: 33.5 g/dL (ref 30.0–36.0)
MCHC: 32.7 g/dL (ref 30.0–36.0)
MCV: 99.3 fL (ref 80.0–100.0)
MCV: 99.6 fL (ref 80.0–100.0)
MCV: 101.4 fL — ABNORMAL HIGH (ref 80.0–100.0)
Platelets: 232 10*3/uL (ref 150–400)
Platelets: 226 10*3/uL (ref 150–400)
Platelets: 230 10*3/uL (ref 150–400)
RBC: 2.68 MIL/uL — ABNORMAL LOW (ref 4.22–5.81)
RBC: 2.61 MIL/uL — ABNORMAL LOW (ref 4.22–5.81)
RBC: 2.8 MIL/uL — ABNORMAL LOW (ref 4.22–5.81)
RDW: 13.1 % (ref 11.5–15.5)
RDW: 13.2 % (ref 11.5–15.5)
RDW: 13.2 % (ref 11.5–15.5)
WBC: 13 10*3/uL — ABNORMAL HIGH (ref 4.0–10.5)
WBC: 12.8 10*3/uL — ABNORMAL HIGH (ref 4.0–10.5)
WBC: 13.8 10*3/uL — ABNORMAL HIGH (ref 4.0–10.5)
nRBC: 0 % (ref 0.0–0.2)
nRBC: 0 % (ref 0.0–0.2)
nRBC: 0 % (ref 0.0–0.2)

## 2024-08-26 LAB — CBC WITH DIFFERENTIAL/PLATELET
Abs Immature Granulocytes: 0.16 10*3/uL — ABNORMAL HIGH (ref 0.00–0.07)
Basophils Absolute: 0 10*3/uL (ref 0.0–0.1)
Basophils Relative: 0 %
Eosinophils Absolute: 0.1 10*3/uL (ref 0.0–0.5)
Eosinophils Relative: 1 %
HCT: 26.9 % — ABNORMAL LOW (ref 39.0–52.0)
Hemoglobin: 9 g/dL — ABNORMAL LOW (ref 13.0–17.0)
Immature Granulocytes: 1 %
Lymphocytes Relative: 9 %
Lymphs Abs: 1.2 10*3/uL (ref 0.7–4.0)
MCH: 33.6 pg (ref 26.0–34.0)
MCHC: 33.5 g/dL (ref 30.0–36.0)
MCV: 100.4 fL — ABNORMAL HIGH (ref 80.0–100.0)
Monocytes Absolute: 1.1 10*3/uL — ABNORMAL HIGH (ref 0.1–1.0)
Monocytes Relative: 8 %
Neutro Abs: 10.3 10*3/uL — ABNORMAL HIGH (ref 1.7–7.7)
Neutrophils Relative %: 81 %
Platelets: 222 10*3/uL (ref 150–400)
RBC: 2.68 MIL/uL — ABNORMAL LOW (ref 4.22–5.81)
RDW: 13.2 % (ref 11.5–15.5)
WBC: 12.8 10*3/uL — ABNORMAL HIGH (ref 4.0–10.5)
nRBC: 0 % (ref 0.0–0.2)

## 2024-08-26 LAB — BASIC METABOLIC PANEL WITH GFR
Anion gap: 12 (ref 5–15)
BUN: 7 mg/dL — ABNORMAL LOW (ref 8–23)
CO2: 23 mmol/L (ref 22–32)
Calcium: 8.1 mg/dL — ABNORMAL LOW (ref 8.9–10.3)
Chloride: 93 mmol/L — ABNORMAL LOW (ref 98–111)
Creatinine, Ser: 0.42 mg/dL — ABNORMAL LOW (ref 0.61–1.24)
GFR, Estimated: >60 mL/min
Glucose, Bld: 114 mg/dL — ABNORMAL HIGH (ref 70–99)
Potassium: 3.9 mmol/L (ref 3.5–5.1)
Sodium: 128 mmol/L — ABNORMAL LOW (ref 135–145)

## 2024-08-26 LAB — GLUCOSE, CAPILLARY
Glucose-Capillary: 108 mg/dL — ABNORMAL HIGH (ref 70–99)
Glucose-Capillary: 111 mg/dL — ABNORMAL HIGH (ref 70–99)
Glucose-Capillary: 111 mg/dL — ABNORMAL HIGH (ref 70–99)
Glucose-Capillary: 119 mg/dL — ABNORMAL HIGH (ref 70–99)
Glucose-Capillary: 129 mg/dL — ABNORMAL HIGH (ref 70–99)
Glucose-Capillary: 135 mg/dL — ABNORMAL HIGH (ref 70–99)

## 2024-08-26 LAB — FIBRINOGEN
Fibrinogen: 490 mg/dL — ABNORMAL HIGH (ref 210–475)
Fibrinogen: 440 mg/dL (ref 210–475)
Fibrinogen: 309 mg/dL (ref 210–475)

## 2024-08-26 LAB — HEPARIN LEVEL (UNFRACTIONATED): Heparin Unfractionated: <0.1 [IU]/mL — ABNORMAL LOW (ref 0.30–0.70)

## 2024-08-26 MED ORDER — LIDOCAINE-EPINEPHRINE (PF) 1 %-1:200000 IJ SOLN
INTRAMUSCULAR | Status: DC | PRN
  Administered 2024-08-26: 10 mL

## 2024-08-26 MED ORDER — METOPROLOL TARTRATE 5 MG/5ML IV SOLN
5.0000 mg | Freq: Once | INTRAVENOUS | Status: AC
  Administered 2024-08-26: 5 mg via INTRAVENOUS

## 2024-08-26 MED ORDER — MIDAZOLAM HCL 5 MG/5ML IJ SOLN
INTRAMUSCULAR | Status: AC
  Filled 2024-08-26: qty 5

## 2024-08-26 MED ORDER — METOPROLOL TARTRATE 5 MG/5ML IV SOLN
INTRAVENOUS | Status: AC
  Filled 2024-08-26: qty 5

## 2024-08-26 MED ORDER — METOPROLOL TARTRATE 5 MG/5ML IV SOLN: 5.0000 mg | Freq: Four times a day (QID) | INTRAVENOUS | Status: AC | PRN

## 2024-08-26 MED ORDER — VITAMIN C 500 MG PO TABS
500.0000 mg | ORAL_TABLET | Freq: Two times a day (BID) | ORAL | Status: AC
  Administered 2024-08-26 – 2024-09-03 (×17): 500 mg via ORAL
  Filled 2024-08-26 (×17): qty 1

## 2024-08-26 MED ORDER — FENTANYL CITRATE (PF) 100 MCG/2ML IJ SOLN
INTRAMUSCULAR | Status: DC | PRN
  Administered 2024-08-26: 50 ug via INTRAVENOUS
  Administered 2024-08-26: 25 ug via INTRAVENOUS

## 2024-08-26 MED ORDER — IODIXANOL 320 MG/ML IV SOLN
INTRAVENOUS | Status: DC | PRN
  Administered 2024-08-26: 20 mL

## 2024-08-26 MED ORDER — MIDAZOLAM HCL (PF) 2 MG/2ML IJ SOLN
INTRAMUSCULAR | Status: DC | PRN
  Administered 2024-08-26 (×2): 1 mg via INTRAVENOUS

## 2024-08-26 MED ORDER — AMIODARONE HCL IN DEXTROSE 360-4.14 MG/200ML-% IV SOLN
60.0000 mg/h | INTRAVENOUS | Status: DC
  Administered 2024-08-26 (×2): 60 mg/h via INTRAVENOUS
  Filled 2024-08-26 (×2): qty 200

## 2024-08-26 MED ORDER — DEXMEDETOMIDINE HCL IN NACL 400 MCG/100ML IV SOLN
0.0000 ug/kg/h | INTRAVENOUS | Status: DC
  Administered 2024-08-26: 0.4 ug/kg/h via INTRAVENOUS

## 2024-08-26 MED ORDER — ALTEPLASE 2 MG IJ SOLR
INTRAMUSCULAR | Status: AC
  Filled 2024-08-26: qty 2

## 2024-08-26 MED ORDER — DEXMEDETOMIDINE HCL IN NACL 400 MCG/100ML IV SOLN
INTRAVENOUS | Status: AC
  Filled 2024-08-26: qty 100

## 2024-08-26 MED ORDER — ENSURE PLUS HIGH PROTEIN PO LIQD
237.0000 mL | Freq: Three times a day (TID) | ORAL | Status: DC
  Administered 2024-08-26 – 2024-08-30 (×6): 237 mL via ORAL

## 2024-08-26 MED ORDER — ADULT MULTIVITAMIN W/MINERALS CH
1.0000 | ORAL_TABLET | Freq: Every day | ORAL | Status: AC
  Administered 2024-08-28 – 2024-09-03 (×7): 1 via ORAL
  Filled 2024-08-26 (×7): qty 1

## 2024-08-26 MED ORDER — AMIODARONE HCL IN DEXTROSE 360-4.14 MG/200ML-% IV SOLN
60.0000 mg/h | INTRAVENOUS | Status: DC
  Administered 2024-08-27 (×2): 60 mg/h via INTRAVENOUS
  Filled 2024-08-26 (×2): qty 200

## 2024-08-26 MED ORDER — HEPARIN SODIUM (PORCINE) 1000 UNIT/ML IJ SOLN
INTRAMUSCULAR | Status: AC
  Filled 2024-08-26: qty 10

## 2024-08-26 MED ORDER — FENTANYL CITRATE (PF) 100 MCG/2ML IJ SOLN
INTRAMUSCULAR | Status: AC
  Filled 2024-08-26: qty 2

## 2024-08-26 MED ORDER — HEPARIN (PORCINE) 25000 UT/250ML-% IV SOLN
INTRAVENOUS | Status: AC
  Filled 2024-08-26: qty 250

## 2024-08-26 MED ORDER — DILTIAZEM HCL-DEXTROSE 125-5 MG/125ML-% IV SOLN (PREMIX)
5.0000 mg/h | INTRAVENOUS | Status: DC
  Filled 2024-08-26: qty 125

## 2024-08-26 MED ORDER — AMIODARONE HCL IN DEXTROSE 360-4.14 MG/200ML-% IV SOLN: 30.0000 mg/h | INTRAVENOUS | Status: DC

## 2024-08-26 MED ORDER — ALTEPLASE 2 MG IJ SOLR
INTRAMUSCULAR | Status: AC
  Filled 2024-08-26: qty 8

## 2024-08-26 MED ORDER — AMIODARONE LOAD VIA INFUSION
150.0000 mg | Freq: Once | INTRAVENOUS | Status: AC
  Administered 2024-08-26: 150 mg via INTRAVENOUS
  Filled 2024-08-26: qty 83.34

## 2024-08-26 MED ORDER — HEPARIN (PORCINE) IN NACL 1000-0.9 UT/500ML-% IV SOLN
INTRAVENOUS | Status: DC | PRN
  Administered 2024-08-26: 1000 mL

## 2024-08-26 NOTE — Plan of Care (Signed)
  Problem: Clinical Measurements: Goal: Respiratory complications will improve Outcome: Progressing Goal: Cardiovascular complication will be avoided Outcome: Progressing   Problem: Nutrition: Goal: Adequate nutrition will be maintained Outcome: Progressing   Problem: Elimination: Goal: Will not experience complications related to urinary retention Outcome: Progressing   Problem: Safety: Goal: Ability to remain free from injury will improve Outcome: Progressing

## 2024-08-26 NOTE — Op Note (Signed)
  [facility]  Percutaneous Study/Intervention Procedural Note   Date of Surgery: 08/26/2024  Surgeon(s):Gilberta Peeters    Assistants:none  Pre-operative Diagnosis: Ischemic leg status post overnight thrombolytic therapy  Post-operative diagnosis:  Same  Procedure(s) Performed:             1.  Left lower extremity angiogram through existing catheter             2.  Catheter placement into left peroneal artery from right femoral approach             3.  Placement of a new 135 cm total length 50 cm working length thrombolytic catheter with instillation of another 8 mg of tPA from the left SFA through the popliteal artery and down into the tibioperoneal trunk and peroneal artery       EBL: 3 cc  Contrast: 20 cc  Fluoro Time: 1.2 minutes  Moderate Conscious Sedation Time: approximately 35 minutes using 2 mg of Versed  and 75 mcg of Fentanyl               Indications:  Patient is a 73 y.o.male with a profoundly ischemic left lower extremity status post overnight thrombolytic therapy. 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  The patient is brought in for angiography for further evaluation and potential treatment.  Due to the limb threatening nature of the situation, angiogram was performed for attempted limb salvage. The patient is aware that if the procedure fails, amputation would be expected.  The patient also understands that even with successful revascularization, amputation may still be required due to the severity of the situation.  Risks and benefits are discussed and informed consent is obtained.   Procedure:  The patient was identified and appropriate procedural time out was performed.  The patient was then placed supine on the table and prepped and draped in the usual sterile fashion. Moderate conscious sedation was administered during a face to face encounter with the patient throughout the  procedure with my supervision of the RN administering medicines and monitoring the patient's vital signs, pulse oximetry, telemetry and mental status throughout from the start of the procedure until the patient was taken to the recovery room.  I placed a V18 wire through the existing thrombolytic catheter and remove the existing thrombolytic catheter.  Selective imaging from the sheath showed the SFA to have flow above the thrombosed aneurysm but the thrombosed aneurysm remained occluded with no flow despite continuous thrombolytic therapy and there was associated occlusive disease above and within the SFA and popliteal arteries.  No flow was seen distally on these images.  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  The popliteal artery remained thrombosed but could be seen on retrograde filling.  Also, the patient had continuous motion despite appropriate moderate sedation.  With this continued thrombosis and difficulty with good imaging, I felt our only hope at limb salvage at this point would be another 24 hours of thrombolytic therapy and bring the patient back with anesthesia assisting tomorrow morning.  A new 135 cm total length 50 cm working length thrombolytic catheter was then parked with the proximal extent of the working portion in the proximal to mid SFA and the distal tip in  the peroneal artery.  8 mg of tPA were instilled through this catheter and the catheter secured in place with a Prolene suture.  The patient was taken to the recovery room in stable condition having tolerated the procedure well.  Findings:                            Left Lower Extremity:  Selective imaging from the sheath showed the SFA to have flow above the  thrombosed aneurysm but the thrombosed aneurysm remained occluded with no flow despite continuous thrombolytic therapy and there was associated occlusive disease above and within the SFA and popliteal arteries.  No flow was seen distally on these images.  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  The popliteal artery remained thrombosed but could be seen on retrograde filling.   Disposition: Patient was taken to the recovery room in stable condition having tolerated the procedure well.  Complications: None  Selinda Gu 08/26/2024 9:48 AM   This note was created with Dragon Medical transcription system. Any errors in dictation are purely unintentional.

## 2024-08-26 NOTE — Consult Note (Signed)
 PHARMACY - ANTICOAGULATION CONSULT NOTE  Pharmacy Consult for heparin  dosing Indication: critical limb ischemia  Allergies[1]  Patient Measurements: Height: 6' (182.9 cm) Weight: 77.1 kg (170 lb) IBW/kg (Calculated) : 77.6 HEPARIN  DW (KG): 77.1  Vital Signs: Temp: 97.7 F (36.5 C) (01/28 2026) Temp Source: Oral (01/28 2026) BP: 138/86 (01/29 0000) Pulse Rate: 86 (01/29 0200)  Labs: Recent Labs    08/24/24 1407 08/24/24 2341 08/25/24 0215 08/25/24 0529 08/25/24 0816 08/25/24 1748 08/26/24 0215  HGB 10.6*  --  8.9* 9.5*  --  10.5*  --   HCT 31.9*  --  26.6* 28.4*  --  31.4*  --   PLT 283  --  232 265  --  263  --   APTT 36  --   --   --   --   --   --   LABPROT 14.6  --   --   --   --   --   --   INR 1.1  --   --   --   --   --   --   HEPARINUNFRC  --  <0.10*  --   --  0.13*  --  <0.10*  CREATININE 0.56*  --   --  0.53*  --   --   --     Estimated Creatinine Clearance: 88.3 mL/min (A) (by C-G formula based on SCr of 0.53 mg/dL (L)).   Medical History: Past Medical History:  Diagnosis Date   Compression fracture of first cervical vertebra (HCC)    Essential tremor    History of hip replacement    Ileus (HCC)     Medications:  No PTA anticoagulation  Assessment: Nicolas Solis is a 41 yoM presenting with concerns for critical limb ischemia of left foot. Past medical history is notable for coronary artery disease, Parkinson's disease, dementia, and abdominal aortic aneurysm. Patient had been complaining of worsening left foot pain, with significant pain at rest that worsens with movement. Physical exam noted for erythema of left lower extremity. ABI of left leg 0.54. Imaging of left foot from 1/27 with no evidence of osteomyelitis. Soft tissue swelling..mild soft tissue edema.  Left lower extremity ultrasound has been ordered, with angiography planned for 1/28.  Baseline hgb 10.6, plt 283, INR 1.1, and aPTT  36.  Date Time HL Rate/Comment 01/27 2341 <0.10 Subtherapeutic on 1200 units/hour 01/28 0816 0.13 Subtherapeutic on 1500 units/hour 01/29   0215    0.10    SUBtherapeutic @ 600 units/hr   Goal of Therapy:  Heparin  level 0.3-0.7 units/ml Monitor platelets by anticoagulation protocol: Yes   Plan:  1/29:  HL @ 0215 = < 0.1 - heparin  is currently running @ 600 units/hr along with alteplase  continuous infusion,  appears that this is meant to be continuous heparin  rate without titration  - will keep  heparin  @ 600 units/hr for now and f/u with vascular, AM team Continue to monitor H&H and platelets  Thank you for involving pharmacy in this patient's care.   Nicolas Solis Clinical Pharmacist 08/26/2024 4:03 AM     [1] No Known Allergies

## 2024-08-26 NOTE — Progress Notes (Signed)
 OT Cancellation Note  Patient Details Name: Nicolas Solis MRN: [id number] DOB: [birth year]   Cancelled Treatment:    Reason Eval/Treat Not Completed: Active bedrest order  Maryelizabeth CHRISTELLA Clause 08/26/2024, 1:43 PM

## 2024-08-26 NOTE — Progress Notes (Addendum)
 Pt to VIR lab for procedure, was in afib HR in 120-130s. Communicated with Dew and Djan at time with new orders. IV metrop given per Djan in recovery, ECG done. Handoff to recovery RN

## 2024-08-26 NOTE — Interval H&P Note (Signed)
 History and Physical Interval Note:  08/26/2024 9:08 AM  Nicolas Solis Medicine  has presented today for surgery, with the diagnosis of left leg lysis.  The various methods of treatment have been discussed with the patient and family. After consideration of risks, benefits and other options for treatment, the patient has consented to  Procedures: LOWER EXTREMITY INTERVENTION (Left) as a surgical intervention.  The patient's history has been reviewed, patient examined, no change in status, stable for surgery.  I have reviewed the patient's chart and labs.  Questions were answered to the patient's satisfaction.     Nicolas Solis

## 2024-08-26 NOTE — Progress Notes (Signed)
 Dr. Lennie gave order for betadine, wet to dry dressing changes to left foot daily.

## 2024-08-26 NOTE — TOC Initial Note (Signed)
 Transition of Care [facility]) - Initial/Assessment Note    Patient Details  Name: Nicolas Solis MRN: [id number] Date of Birth: 13-Aug-1950  [facility]) CM/SW Contact:    Corrie JINNY Ruts, LCSW Phone Number: 08/26/2024, 2:17 PM  Clinical Narrative:                 Chart reviewed. Please note that the patient had sedation today due to a procedure. I was able to speak with the patient wife via phone call. I introduced myself, my role, and reason for consult. The patient PCP is Vishwanath Hande.  The patient wife reports that she lives in the home with the patient. The patient wife reports that the patient needs assistance completing task in the home and she would assist the patient.   The patient wife reports that she would drive the patient to medical appointments. The patient wife reports that she will assist the patient at D/C. The patient wife reports that the patient uses [facility]. The patient wife reports that the patient has had HH a couple of years ago. The patient wife reports that the patient was admitted into [facility] car a few years ago. The patient wife reports that the patient has a museum/gallery exhibitions officer and wheel chair in the home.   SW inquired about if interested in  [facility] or SNF placement if recommended. The patient wife was accepting of both if recommended. The patient wife reports that she would not like to go back to [facility] care.         Patient Goals and CMS Choice            Expected Discharge Plan and Services                                              Prior Living Arrangements/Services                       Activities of Daily Living   ADL Screening (condition at time of admission) Independently performs ADLs?: No Does the patient have a NEW difficulty with bathing/dressing/toileting/self-feeding that is expected to last >3 days?: Yes (Initiates electronic notice to provider for possible OT consult) Does the patient  have a NEW difficulty with getting in/out of bed, walking, or climbing stairs that is expected to last >3 days?: No Does the patient have a NEW difficulty with communication that is expected to last >3 days?: Yes (Initiates electronic notice to provider for possible SLP consult) Is the patient deaf or have difficulty hearing?: Yes Does the patient have difficulty seeing, even when wearing glasses/contacts?: No Does the patient have difficulty concentrating, remembering, or making decisions?: Yes  Permission Sought/Granted                  Emotional Assessment              Admission diagnosis:  Cellulitis of left lower extremity [L03.116] Critical limb ischemia of left lower extremity (HCC) [I70.222] Chronic ulcer of left foot, unspecified ulcer stage (HCC) [L97.529] Patient Active Problem List   Diagnosis Date Noted   Critical limb ischemia of left lower extremity (HCC) 08/24/2024   Chronic ulcer of left foot, unspecified ulcer stage (HCC) 08/24/2024   Closed fracture of transverse process of lumbar vertebra (HCC) 10/08/2022   Hyponatremia 10/06/2022   Parkinson's disease (  HCC) 10/06/2022   Hyperlipidemia 10/06/2022   Frequent falls 10/06/2022   Hypomagnesemia 10/06/2022   Alcohol abuse 10/06/2022   Left rib fracture 10/06/2022   L1 vertebral fracture (HCC) 10/06/2022   Coronary artery disease    Macular hole, right eye 03/18/2017   PCP:  Sadie Manna, MD Pharmacy:   [facility] [address], [place] - [address] [place] [address] Phone: [phone] Fax: [phone]     Social Drivers of Health (SDOH) Social History: SDOH Screenings   Food Insecurity: No Food Insecurity (08/25/2024)  Housing: Low Risk (08/25/2024)  Transportation Needs: No Transportation Needs (08/25/2024)  Utilities: Not At Risk (08/25/2024)  Financial Resource Strain: Low Risk  (08/10/2024)   Received from [facility]  Social  Connections: Moderately Isolated (08/25/2024)  Tobacco Use: Medium Risk (08/25/2024)   SDOH Interventions:     Readmission Risk Interventions     No data to display

## 2024-08-26 NOTE — Progress Notes (Signed)
 Upon arrival to Special procedures this patient appeared to be in atrial fibrillation rate of 110.  Previous report was that patient was in NSR with frequent PAC's rate in the 80's.  Report from Central telemetry was nsr with frequent PAC;s at [phone].  Rocky Love, RN sedating P/V nurse notified the hospitalist Dr. Dorinda of the change in this patient's heart rthythm.

## 2024-08-26 NOTE — Progress Notes (Addendum)
 Initial Nutrition Assessment  DOCUMENTATION CODES:   Not applicable  INTERVENTION:   Ensure Plus High Protein po BID, each supplement provides 350 kcal and 20 grams of protein  MVI po daily   Vitamin C  250mg  po BID  Pt at high refeed risk; recommend monitor potassium, magnesium  and phosphorus labs daily until stable  Daily weights   NUTRITION DIAGNOSIS:   Increased nutrient needs related to wound healing as evidenced by estimated needs.  GOAL:   Patient will meet greater than or equal to 90% of their needs  MONITOR:   PO intake, Supplement acceptance, Labs, Weight trends, Skin, I & O's  REASON FOR ASSESSMENT:   Malnutrition Screening Tool    ASSESSMENT:   74 y/o male with h/o CAD, HLD, Parkinsons disease, dementia, tremor and compression fracture who is admitted with leg ischemia s/p thrombolytic therapy, SIRS and cellulitis.  RD unable to see pt today. Pt with increased estimated needs r/t wound healing. RD will add supplements and vitamins to help pt meet his estimated needs and to support wound healing. Pt is documented to have eaten 50% of his meals today. Pt is at high refeed risk. Per chart, pt appears weight stable at baseline. RD will obtain exam at follow up.   Medications reviewed and include: aspirin , colace, ceftriaxone , heparin , vancomycin    Labs reviewed: Na 128(L), K 3.9 wnl, BUN 7(L), creat 0.42(L) Wbc- 13.8(H), Hgb 9.3(L), Hct 28.4(L), MCV 101.4(H) Cbgs- 111, 111, 108, 119 x 24 hrs   NUTRITION - FOCUSED PHYSICAL EXAM: Unable to perform at this time   Diet Order:   Diet Order             Diet Carb Modified Room service appropriate? Yes  Diet effective now                  EDUCATION NEEDS:   No education needs have been identified at this time  Skin:  Skin Assessment: Reviewed RN Assessment (wounds L foot and heel)  Last BM:  1/28- type 1  Height:   Ht Readings from Last 1 Encounters:  08/24/24 6' (1.829 m)    Weight:   Wt  Readings from Last 1 Encounters:  08/24/24 77.1 kg   BMI:  Body mass index is 23.06 kg/m.  Estimated Nutritional Needs:   Kcal:  2000-2300kcal/day  Protein:  100-115g/day  Fluid:  2.0-2.3L/day  Augustin Shams MS, RD, LDN If unable to be reached, please send secure chat to RD inpatient available from 8:00a-4:00p daily

## 2024-08-26 NOTE — Consult Note (Signed)
" °  CLINICAL SUPPORT TEAM - WOUND OSTOMY AND CONTINENCE TEAM  CONSULTATION SERVICES   WOC Nurse-Inpatient Note    WOC Nurse Consult Note: Reason for Consult: left foot ulceration   Noted Podiatry and Vascular involvement with patient in relation to left foot wounds and perfusion concerns.  Noted reviewed,  Podiatry saw patient on 1/28, notes at that time reflect the following wound care: Betadine wet to dry dressing applied today.   WOC team will defer management to Podiatry/vascular providers and will not consult at this time.   Thank you,  Doyal Polite, MSN, RN, [facility] WOC Team [phone] (Available Mon-Fri 0700-1500)     "

## 2024-08-26 NOTE — Progress Notes (Signed)
 " Progress Note   Patient: Nicolas Solis FMW:[id number] DOB: September 21, 1950 DOA: 08/24/2024     2 DOS: the patient was seen and examined on 08/26/2024     Brief hospital course: From HPI Nicolas Solis is a 74 y.o. male with medical history significant of coronary artery disease, Parkinson's disease presenting to the emergency department for evaluation of worsening pain, swelling, and discoloration of his left foot.  History is per his wife, present at bedside.  He has had numbness and tingling of his left leg from the knee throughout his foot since December.  At some point he developed an ulceration at the base of his fifth toe on the left that has been increasing in size for the past few weeks. No known injury or drainage from the wound. He started having chills 2 nights ago. Denies other systemic symptoms.     He's taken a course of antibiotics and was prescribed gabapentin  without improvement.    He saw Podiatry about two weeks ago and was referred to Vascular Surgery but has not yet seen them. He was in for follow up with PCP today and was referred to the ED given concern for acute limb ischemia    In the ED he met SIRS criteria with WBC and tachycardia. BP soft initially but fluid responsive.  XR left foot without evidence of OM. Bedside ABI  0.54.  Vascular surgery was consulted.  He was started on heparin  drip and antibiotics with plan for angiography tomorrow. Hospitalist consulted for admission      Assessment and Plan:    Atherosclerosis obliterans with ulceration  Continue heparin  Vascular surgery planning angiogram today Podiatry following   SIRS Left foot ulceration with surrounding cellulitis  - leukocytosis and tachycardia, otherwise hemodynamically stable Continue current antibiotics Vascular surgeon and podiatry on board   New onset A-fib with RVR Patient on heparin  drip Continue amiodarone  I have discussed case with cardiology Dr. Perla  Acute on chronic  macrocytic anemia  - Hgb slowly owndrending from 12.5 in December 2025  - f/u folate, B12 - monitor/transfuse as indicated    Hyponatremia, mild  Sodium level improving   Parkinson's disease  Dementia with behavioral disturbance Continue delirium precaution Currently on Precedex  drip   CAD HLD Continue home statin therapy      Advance Care Planning:   Code Status: Full Code  Discussed with patient at time of admission.    Consults: Vascular Surgery    Family Communication: Wife at bedside      Subjective:  Seen and examined bedside this morning .  Agitated during procedure as well as postprocedure Patient was therefore returned to the ICU by vascular surgeon and initiated on Precedex  drip Patient was also found to have new onset A-fib with RVR requiring amiodarone  drip and cardiologist consultation   Physical Exam:   General: He is not in acute distress.    Appearance: He is not toxic-appearing.  Eyes:     Extraocular Movements: Extraocular movements intact.     Pupils: Pupils are equal, round, and reactive to light.  Cardiovascular:     Rate and Rhythm: Normal rate and regular rhythm.  Pulmonary:     Effort: Pulmonary effort is normal.     Breath sounds: Normal breath sounds.  Abdominal:     General: There is no distension.     Palpations: Abdomen is soft.     Tenderness: There is no abdominal tenderness.  Musculoskeletal:    Dressing noted to the  left leg appears clean and dry Neurological: Tremors noted to the left upper extremity   Data Reviewed:    Latest Ref Rng & Units 08/26/2024   11:15 AM 08/26/2024    6:54 AM 08/26/2024    5:32 AM  CBC  WBC 4.0 - 10.5 K/uL 13.8  12.8  12.8   Hemoglobin 13.0 - 17.0 g/dL 9.3  8.7  9.0   Hematocrit 39.0 - 52.0 % 28.4  26.0  26.9   Platelets 150 - 400 K/uL 230  226  222        Latest Ref Rng & Units 08/26/2024    5:32 AM 08/25/2024    5:29 AM 08/24/2024    2:07 PM  BMP  Glucose 70 - 99 mg/dL 885  892  879   BUN 8 -  23 mg/dL 7  12  16    Creatinine 0.61 - 1.24 mg/dL 9.57  9.46  9.43   Sodium 135 - 145 mmol/L 128  130  131   Potassium 3.5 - 5.1 mmol/L 3.9  3.7  4.3   Chloride 98 - 111 mmol/L 93  95  93   CO2 22 - 32 mmol/L 23  21  25    Calcium  8.9 - 10.3 mg/dL 8.1  8.3  9.3    Vitals:   08/26/24 1615 08/26/24 1630 08/26/24 1645 08/26/24 1700  BP: 111/67 104/76  110/70  Pulse: 81 85 86 85  Resp: (!) 25 20 (!) 22 20  Temp:    99.1 F (37.3 C)  TempSrc:    Axillary  SpO2: 100% 96% 97% 99%  Weight:      Height:        Author: Drue ONEIDA Potter, MD 08/26/2024 6:09 PM  For on call review [email or web address].  "

## 2024-08-26 NOTE — Progress Notes (Signed)
 Notified Dr. Gollan that patient is back in Afib rate 120s. MD acknowledged and gave order to keep amiodarone  rate at 60 mg/H.

## 2024-08-27 ENCOUNTER — Encounter: Admission: EM | Payer: Self-pay | Source: Home / Self Care | Attending: Emergency Medicine

## 2024-08-27 ENCOUNTER — Inpatient Hospital Stay: Payer: Self-pay | Admitting: Certified Registered"

## 2024-08-27 ENCOUNTER — Inpatient Hospital Stay: Admit: 2024-08-27

## 2024-08-27 ENCOUNTER — Encounter: Payer: Self-pay | Admitting: Vascular Surgery

## 2024-08-27 DIAGNOSIS — E782 Mixed hyperlipidemia: Secondary | ICD-10-CM | POA: Diagnosis not present

## 2024-08-27 DIAGNOSIS — I70222 Atherosclerosis of native arteries of extremities with rest pain, left leg: Secondary | ICD-10-CM | POA: Diagnosis not present

## 2024-08-27 DIAGNOSIS — I2510 Atherosclerotic heart disease of native coronary artery without angina pectoris: Secondary | ICD-10-CM | POA: Diagnosis not present

## 2024-08-27 DIAGNOSIS — I480 Paroxysmal atrial fibrillation: Secondary | ICD-10-CM

## 2024-08-27 DIAGNOSIS — L97529 Non-pressure chronic ulcer of other part of left foot with unspecified severity: Secondary | ICD-10-CM | POA: Diagnosis not present

## 2024-08-27 LAB — CBC WITH DIFFERENTIAL/PLATELET
Abs Immature Granulocytes: 0.24 10*3/uL — ABNORMAL HIGH (ref 0.00–0.07)
Basophils Absolute: 0 10*3/uL (ref 0.0–0.1)
Basophils Relative: 0 %
Eosinophils Absolute: 0.2 10*3/uL (ref 0.0–0.5)
Eosinophils Relative: 1 %
HCT: 25 % — ABNORMAL LOW (ref 39.0–52.0)
Hemoglobin: 8.2 g/dL — ABNORMAL LOW (ref 13.0–17.0)
Immature Granulocytes: 2 %
Lymphocytes Relative: 9 %
Lymphs Abs: 1.1 10*3/uL (ref 0.7–4.0)
MCH: 32.8 pg (ref 26.0–34.0)
MCHC: 32.8 g/dL (ref 30.0–36.0)
MCV: 100 fL (ref 80.0–100.0)
Monocytes Absolute: 0.9 10*3/uL (ref 0.1–1.0)
Monocytes Relative: 7 %
Neutro Abs: 9.6 10*3/uL — ABNORMAL HIGH (ref 1.7–7.7)
Neutrophils Relative %: 81 %
Platelets: 192 10*3/uL (ref 150–400)
RBC: 2.5 MIL/uL — ABNORMAL LOW (ref 4.22–5.81)
RDW: 13.2 % (ref 11.5–15.5)
WBC: 12 10*3/uL — ABNORMAL HIGH (ref 4.0–10.5)
nRBC: 0 % (ref 0.0–0.2)

## 2024-08-27 LAB — CBC
HCT: 25.9 % — ABNORMAL LOW (ref 39.0–52.0)
HCT: 25.7 % — ABNORMAL LOW (ref 39.0–52.0)
HCT: 24 % — ABNORMAL LOW (ref 39.0–52.0)
Hemoglobin: 8.6 g/dL — ABNORMAL LOW (ref 13.0–17.0)
Hemoglobin: 8.8 g/dL — ABNORMAL LOW (ref 13.0–17.0)
Hemoglobin: 8 g/dL — ABNORMAL LOW (ref 13.0–17.0)
MCH: 33.3 pg (ref 26.0–34.0)
MCH: 33.8 pg (ref 26.0–34.0)
MCH: 33.2 pg (ref 26.0–34.0)
MCHC: 33.2 g/dL (ref 30.0–36.0)
MCHC: 34.2 g/dL (ref 30.0–36.0)
MCHC: 33.3 g/dL (ref 30.0–36.0)
MCV: 100.4 fL — ABNORMAL HIGH (ref 80.0–100.0)
MCV: 98.8 fL (ref 80.0–100.0)
MCV: 99.6 fL (ref 80.0–100.0)
Platelets: 208 10*3/uL (ref 150–400)
Platelets: 203 10*3/uL (ref 150–400)
Platelets: 203 10*3/uL (ref 150–400)
RBC: 2.58 MIL/uL — ABNORMAL LOW (ref 4.22–5.81)
RBC: 2.6 MIL/uL — ABNORMAL LOW (ref 4.22–5.81)
RBC: 2.41 MIL/uL — ABNORMAL LOW (ref 4.22–5.81)
RDW: 13.2 % (ref 11.5–15.5)
RDW: 13.2 % (ref 11.5–15.5)
RDW: 13.1 % (ref 11.5–15.5)
WBC: 13.1 10*3/uL — ABNORMAL HIGH (ref 4.0–10.5)
WBC: 13 10*3/uL — ABNORMAL HIGH (ref 4.0–10.5)
WBC: 14.3 10*3/uL — ABNORMAL HIGH (ref 4.0–10.5)
nRBC: 0 % (ref 0.0–0.2)
nRBC: 0 % (ref 0.0–0.2)
nRBC: 0 % (ref 0.0–0.2)

## 2024-08-27 LAB — GLUCOSE, CAPILLARY
Glucose-Capillary: 110 mg/dL — ABNORMAL HIGH (ref 70–99)
Glucose-Capillary: 139 mg/dL — ABNORMAL HIGH (ref 70–99)
Glucose-Capillary: 187 mg/dL — ABNORMAL HIGH (ref 70–99)
Glucose-Capillary: 203 mg/dL — ABNORMAL HIGH (ref 70–99)

## 2024-08-27 LAB — HEPATIC FUNCTION PANEL
ALT: 12 U/L (ref 0–44)
AST: 84 U/L — ABNORMAL HIGH (ref 15–41)
Albumin: 3 g/dL — ABNORMAL LOW (ref 3.5–5.0)
Alkaline Phosphatase: 201 U/L — ABNORMAL HIGH (ref 38–126)
Bilirubin, Direct: 0.3 mg/dL — ABNORMAL HIGH (ref 0.0–0.2)
Indirect Bilirubin: 0.1 mg/dL — ABNORMAL LOW (ref 0.3–0.9)
Total Bilirubin: 0.4 mg/dL (ref 0.0–1.2)
Total Protein: 6.9 g/dL (ref 6.5–8.1)

## 2024-08-27 LAB — BASIC METABOLIC PANEL WITH GFR
Anion gap: 9 (ref 5–15)
BUN: 14 mg/dL (ref 8–23)
CO2: 23 mmol/L (ref 22–32)
Calcium: 7.7 mg/dL — ABNORMAL LOW (ref 8.9–10.3)
Chloride: 91 mmol/L — ABNORMAL LOW (ref 98–111)
Creatinine, Ser: 0.52 mg/dL — ABNORMAL LOW (ref 0.61–1.24)
GFR, Estimated: >60 mL/min
Glucose, Bld: 119 mg/dL — ABNORMAL HIGH (ref 70–99)
Potassium: 3.5 mmol/L (ref 3.5–5.1)
Sodium: 124 mmol/L — ABNORMAL LOW (ref 135–145)

## 2024-08-27 LAB — SODIUM, URINE, RANDOM: Sodium, Ur: 31 mmol/L

## 2024-08-27 LAB — PHOSPHORUS: Phosphorus: 2.8 mg/dL (ref 2.5–4.6)

## 2024-08-27 LAB — CHLORIDE, URINE, RANDOM: Chloride Urine: 56 mmol/L

## 2024-08-27 LAB — MAGNESIUM: Magnesium: 1.8 mg/dL (ref 1.7–2.4)

## 2024-08-27 LAB — OSMOLALITY, URINE: Osmolality, Ur: 651 mosm/kg (ref 300–900)

## 2024-08-27 LAB — OSMOLALITY: Osmolality: 272 mosm/kg — ABNORMAL LOW (ref 275–295)

## 2024-08-27 MED ORDER — ONDANSETRON HCL 4 MG/2ML IJ SOLN
INTRAMUSCULAR | Status: AC
  Filled 2024-08-27: qty 2

## 2024-08-27 MED ORDER — FENTANYL CITRATE (PF) 100 MCG/2ML IJ SOLN
INTRAMUSCULAR | Status: DC | PRN
  Administered 2024-08-27 (×2): 50 ug via INTRAVENOUS

## 2024-08-27 MED ORDER — NEOSTIGMINE METHYLSULFATE 10 MG/10ML IV SOLN
INTRAVENOUS | Status: AC
  Filled 2024-08-27: qty 1

## 2024-08-27 MED ORDER — PROPOFOL 10 MG/ML IV BOLUS
INTRAVENOUS | Status: AC
  Filled 2024-08-27: qty 20

## 2024-08-27 MED ORDER — SUGAMMADEX SODIUM 200 MG/2ML IV SOLN
INTRAVENOUS | Status: DC | PRN
  Administered 2024-08-27: 320 mg via INTRAVENOUS

## 2024-08-27 MED ORDER — PROPOFOL 10 MG/ML IV BOLUS
INTRAVENOUS | Status: DC | PRN
  Administered 2024-08-27: 90 mg via INTRAVENOUS

## 2024-08-27 MED ORDER — AMIODARONE HCL 200 MG PO TABS
200.0000 mg | ORAL_TABLET | Freq: Every day | ORAL | Status: AC
  Administered 2024-09-01 – 2024-09-03 (×3): 200 mg via ORAL
  Filled 2024-08-27 (×3): qty 1

## 2024-08-27 MED ORDER — ROCURONIUM BROMIDE 100 MG/10ML IV SOLN
INTRAVENOUS | Status: DC | PRN
  Administered 2024-08-27: 70 mg via INTRAVENOUS

## 2024-08-27 MED ORDER — PHENYLEPHRINE 80 MCG/ML (10ML) SYRINGE FOR IV PUSH (FOR BLOOD PRESSURE SUPPORT)
PREFILLED_SYRINGE | INTRAVENOUS | Status: DC | PRN
  Administered 2024-08-27 (×2): 80 ug via INTRAVENOUS

## 2024-08-27 MED ORDER — LIDOCAINE HCL (PF) 2 % IJ SOLN
INTRAMUSCULAR | Status: AC
  Filled 2024-08-27: qty 5

## 2024-08-27 MED ORDER — ONDANSETRON HCL 4 MG/2ML IJ SOLN
INTRAMUSCULAR | Status: DC | PRN
  Administered 2024-08-27: 4 mg via INTRAVENOUS

## 2024-08-27 MED ORDER — ROCURONIUM BROMIDE 10 MG/ML (PF) SYRINGE
PREFILLED_SYRINGE | INTRAVENOUS | Status: AC
  Filled 2024-08-27: qty 10

## 2024-08-27 MED ORDER — EPHEDRINE 5 MG/ML INJ
INTRAVENOUS | Status: AC
  Filled 2024-08-27: qty 5

## 2024-08-27 MED ORDER — PHENYLEPHRINE 80 MCG/ML (10ML) SYRINGE FOR IV PUSH (FOR BLOOD PRESSURE SUPPORT)
PREFILLED_SYRINGE | INTRAVENOUS | Status: AC
  Filled 2024-08-27: qty 10

## 2024-08-27 MED ORDER — DEXAMETHASONE SOD PHOSPHATE PF 10 MG/ML IJ SOLN
INTRAMUSCULAR | Status: DC | PRN
  Administered 2024-08-27: 10 mg via INTRAVENOUS

## 2024-08-27 MED ORDER — LACTATED RINGERS IV SOLN: INTRAVENOUS | Status: DC

## 2024-08-27 MED ORDER — ACETAMINOPHEN 10 MG/ML IV SOLN: 1000.0000 mg | Freq: Once | INTRAVENOUS | Status: DC | PRN

## 2024-08-27 MED ORDER — IODIXANOL 320 MG/ML IV SOLN
INTRAVENOUS | Status: DC | PRN
  Administered 2024-08-27: 40 mL

## 2024-08-27 MED ORDER — HEPARIN SODIUM (PORCINE) 1000 UNIT/ML IJ SOLN
INTRAMUSCULAR | Status: AC
  Filled 2024-08-27: qty 10

## 2024-08-27 MED ORDER — LACTATED RINGERS IV SOLN: INTRAVENOUS | Status: DC | PRN

## 2024-08-27 MED ORDER — DEXAMETHASONE SOD PHOSPHATE PF 10 MG/ML IJ SOLN
INTRAMUSCULAR | Status: AC
  Filled 2024-08-27: qty 1

## 2024-08-27 MED ORDER — VANCOMYCIN HCL IN DEXTROSE 1-5 GM/200ML-% IV SOLN
INTRAVENOUS | Status: AC
  Filled 2024-08-27: qty 200

## 2024-08-27 MED ORDER — FENTANYL CITRATE (PF) 100 MCG/2ML IJ SOLN
INTRAMUSCULAR | Status: AC
  Filled 2024-08-27: qty 2

## 2024-08-27 MED ORDER — FENTANYL CITRATE (PF) 100 MCG/2ML IJ SOLN
25.0000 ug | INTRAMUSCULAR | Status: DC | PRN
  Administered 2024-08-27 (×2): 25 ug via INTRAVENOUS

## 2024-08-27 MED ORDER — HEPARIN SODIUM (PORCINE) 1000 UNIT/ML IJ SOLN
INTRAMUSCULAR | Status: DC | PRN
  Administered 2024-08-27: 3000 [IU] via INTRAVENOUS

## 2024-08-27 MED ORDER — SODIUM CHLORIDE 0.9 % IV SOLN: INTRAVENOUS | Status: AC

## 2024-08-27 MED ORDER — PHENYLEPHRINE HCL-NACL 20-0.9 MG/250ML-% IV SOLN
INTRAVENOUS | Status: AC
  Filled 2024-08-27: qty 250

## 2024-08-27 MED ORDER — LIDOCAINE-EPINEPHRINE (PF) 1 %-1:200000 IJ SOLN
INTRAMUSCULAR | Status: DC | PRN
  Administered 2024-08-27: 10 mL

## 2024-08-27 MED ORDER — LIDOCAINE HCL (CARDIAC) PF 100 MG/5ML IV SOSY
PREFILLED_SYRINGE | INTRAVENOUS | Status: DC | PRN
  Administered 2024-08-27: 100 mg via INTRAVENOUS

## 2024-08-27 MED ORDER — HEPARIN (PORCINE) IN NACL 2000-0.9 UNIT/L-% IV SOLN
INTRAVENOUS | Status: DC | PRN
  Administered 2024-08-27: 1000 mL

## 2024-08-27 MED ORDER — AMIODARONE HCL 200 MG PO TABS
400.0000 mg | ORAL_TABLET | Freq: Two times a day (BID) | ORAL | Status: AC
  Administered 2024-08-27 – 2024-08-31 (×10): 400 mg via ORAL
  Filled 2024-08-27 (×10): qty 2

## 2024-08-27 MED ORDER — TIROFIBAN (AGGRASTAT) BOLUS VIA INFUSION
25.0000 ug/kg | Freq: Once | INTRAVENOUS | Status: AC
  Administered 2024-08-27: 1970 ug via INTRAVENOUS
  Filled 2024-08-27: qty 40

## 2024-08-27 MED ORDER — PHENYLEPHRINE HCL-NACL 20-0.9 MG/250ML-% IV SOLN
INTRAVENOUS | Status: DC | PRN
  Administered 2024-08-27: 40 ug/min via INTRAVENOUS

## 2024-08-27 MED ORDER — EPHEDRINE SULFATE-NACL 50-0.9 MG/10ML-% IV SOSY
PREFILLED_SYRINGE | INTRAVENOUS | Status: DC | PRN
  Administered 2024-08-27 (×2): 5 mg via INTRAVENOUS

## 2024-08-27 MED ORDER — TIROFIBAN HCL IN NACL 5-0.9 MG/100ML-% IV SOLN
0.1500 ug/kg/min | INTRAVENOUS | Status: AC
  Administered 2024-08-27 (×3): 0.15 ug/kg/min via INTRAVENOUS
  Filled 2024-08-27 (×5): qty 100

## 2024-08-27 NOTE — Progress Notes (Addendum)
 Pharmacy Antibiotic Note  Nicolas Solis is a 74 y.o. male admitted on 08/24/2024 with concern for ulcer and decreased circulation to the left foot. The patient was initially seen at a PCP appointment today and was referred to the ED due to multiple wounds, erythema, and swelling of the left foot extending up to the calf. PMH significant for Parkinson's, dementia, CAD, anemia, hyperglycemia, neuropathy, and BPH. Pharmacist. Pharmacy has been consulted for vancomycin  dosing and monitoring due to concern for cellulitis.  Plan:  Continue vancomycin  1 g IV q12h --Calculated AUC: 450, Cmin: 12.2 --Scr 0.8, IBW, Vd 0.72 --Daily Scr per protocol, creatinine has been stable. Hold off on levels for now and follow-up ability to de-escalate after procedure. Pending transmetatarsal foot amputation 08/28/24.   Height: 6' (182.9 cm) Weight: 78.8 kg (173 lb 11.6 oz) IBW/kg (Calculated) : 77.6  Temp (24hrs), Avg:98.6 F (37 C), Min:97.8 F (36.6 C), Max:99.3 F (37.4 C)  Recent Labs  Lab 08/24/24 1407 08/25/24 0215 08/25/24 0529 08/25/24 1748 08/26/24 0532 08/26/24 0654 08/26/24 1115 08/27/24 0423  WBC 15.4*   < > 12.9* 14.1* 12.8* 12.8* 13.8* 12.0*  CREATININE 0.56*  --  0.53*  --  0.42*  --   --  0.52*  LATICACIDVEN 1.3  --   --   --   --   --   --   --    < > = values in this interval not displayed.    Estimated Creatinine Clearance: 88.9 mL/min (A) (by C-G formula based on SCr of 0.52 mg/dL (L)).    Allergies[1]  Antimicrobials this admission: Zosyn  x 1 Ceftriaxone  1/27 >> Vancomycin  1/27 >>  Dose adjustments this admission: N/A  Microbiology results: 1/27 BCx: NGTD 1/28 MRSA PCR: (-)  Thank you for allowing pharmacy to be a part of this patients care.   Marolyn KATHEE Mare 08/27/2024 12:18 PM     [1] No Known Allergies

## 2024-08-27 NOTE — Plan of Care (Signed)
   Problem: Health Behavior/Discharge Planning: Goal: Ability to manage health-related needs will improve Outcome: Progressing   Problem: Education: Goal: Knowledge of General Education information will improve Description: Including pain rating scale, medication(s)/side effects and non-pharmacologic comfort measures Outcome: Progressing   Problem: Clinical Measurements: Goal: Ability to maintain clinical measurements within normal limits will improve Outcome: Progressing   Problem: Clinical Measurements: Goal: Will remain free from infection Outcome: Progressing   Problem: Clinical Measurements: Goal: Diagnostic test results will improve Outcome: Progressing

## 2024-08-27 NOTE — Progress Notes (Signed)
" ° ° °  PROCEDURAL EXPEDITER PROGRESS NOTE  Patient Name: Nicolas Solis  DOB:05-27-1951 Date of Admission: 08/24/2024  Date of Assessment:08/27/24   -------------------------------------------------------------------------------------------------------------------   Brief clinical summary: pt is a 38 male scheduled for surgery on 08/28/24 for amputation of left foot.    Orders in place:  No   Communication with surgical team if no orders: reached out to Dr. Lennie for consent and pre op orders.    Labs, test, and orders reviewed: yes  Requires surgical clearance:  No  What type of clearance: n/a  Clearance received: n/a  Barriers noted:n/a   Intervention provided by [facility] team: reached out to surgeon Dr. Lennie for orders by IB message   Barrier resolved:  not applicable   -------------------------------------------------------------------------------------------------------------------  [facility] [employer], Ronal DELENA Bald Please contact us  directly via secure chat (search for [facility]) or by calling us  at [phone] [facility]).  "

## 2024-08-27 NOTE — Progress Notes (Signed)
 PODIATRY / FOOT AND ANKLE SURGERY PROGRESS NOTE  Requesting Physician: Dr. Collie  Reason for consult: L foot gangrene   HPI: Nicolas Solis is a 74 y.o. male who presents today resting in the ICU bed fairly comfortably.  Appears that his pain to the left lower extremity has improved since recent revascularization attempts.  Patient was noted to have two-vessel runoff to the foot with no anterior tibial flow through the dorsalis pedis to the top of the foot.  Patient's family is with patient at bedside.  PMHx:  Past Medical History:  Diagnosis Date   Compression fracture of first cervical vertebra (HCC)    Essential tremor    History of hip replacement    Ileus (HCC)     Surgical Hx:  Past Surgical History:  Procedure Laterality Date   25 GAUGE PARS PLANA VITRECTOMY WITH 20 GAUGE MVR PORT Right 03/18/2017   25 GAUGE PARS PLANA VITRECTOMY WITH 20 GAUGE MVR PORT FOR MACULAR HOLE Right 03/18/2017   Procedure: 25 GAUGE PARS PLANA VITRECTOMY WITH 20 GAUGE MVR PORT FOR MACULAR HOLE; PHOTOCOAGULATION RIGHT EYE;  Surgeon: Alvia Norleen BIRCH, MD;  Location: [facility] OR;  Service: Ophthalmology;  Laterality: Right;   APPENDECTOMY     COLONOSCOPY W/ POLYPECTOMY     GAS INSERTION Right 03/18/2017   Procedure: INSERTION OF GAS RIGHT (C3F8);  Surgeon: Alvia Norleen BIRCH, MD;  Location: [facility] OR;  Service: Ophthalmology;  Laterality: Right;   GAS/FLUID EXCHANGE Right 03/18/2017   Procedure: GAS/FLUID EXCHANGE RIGHT EYE;  Surgeon: Alvia Norleen BIRCH, MD;  Location: [facility] OR;  Service: Ophthalmology;  Laterality: Right;   JOINT REPLACEMENT Right    Hip   LEFT HEART CATH AND CORONARY ANGIOGRAPHY N/A 02/06/2022   Procedure: LEFT HEART CATH AND CORONARY ANGIOGRAPHY;  Surgeon: Darron Deatrice LABOR, MD;  Location: [facility] INVASIVE CV LAB;  Service: Cardiovascular;  Laterality: N/A;   LOWER EXTREMITY ANGIOGRAPHY Left 08/25/2024   Procedure: Lower Extremity Angiography;  Surgeon: Marea Selinda RAMAN, MD;  Location: [facility] INVASIVE CV LAB;   Service: Cardiovascular;  Laterality: Left;   LOWER EXTREMITY INTERVENTION Left 08/26/2024   Procedure: LOWER EXTREMITY INTERVENTION;  Surgeon: Marea Selinda RAMAN, MD;  Location: [facility] INVASIVE CV LAB;  Service: Cardiovascular;  Laterality: Left;   LOWER EXTREMITY INTERVENTION Left 08/27/2024   Procedure: LOWER EXTREMITY INTERVENTION;  Surgeon: Marea Selinda RAMAN, MD;  Location: [facility] INVASIVE CV LAB;  Service: Cardiovascular;  Laterality: Left;   MEMBRANE PEEL Right 03/18/2017   Procedure: MEMBRANE PEEL RIGHT EYE;  Surgeon: Alvia Norleen BIRCH, MD;  Location: [facility] OR;  Service: Ophthalmology;  Laterality: Right;   MOUTH SURGERY     PHOTOCOAGULATION Left 03/18/2017   Procedure: PHOTOCOAGULATION LEFT EYE;  Surgeon: Alvia Norleen BIRCH, MD;  Location: [facility] OR;  Service: Ophthalmology;  Laterality: Left;   SERUM PATCH Right 03/18/2017   Procedure: SERUM PATCH RIGHT EYE;  Surgeon: Alvia Norleen BIRCH, MD;  Location: [facility] OR;  Service: Ophthalmology;  Laterality: Right;    FHx:  Family History  Problem Relation Age of Onset   Stroke Brother     Social History:  reports that he quit smoking about 8 years ago. His smoking use included cigarettes. He started smoking about 58 years ago. He has been exposed to tobacco smoke. He has never used smokeless tobacco. He reports current alcohol use of about 7.0 standard drinks of alcohol per week. He reports that he does not use drugs.  Allergies: Allergies[1]  Medications Prior to Admission  Medication Sig Dispense  Refill   acetaminophen  (TYLENOL ) 500 MG tablet Take 1,000 mg by mouth every 6 (six) hours as needed (for pain.).     aspirin  EC 81 MG tablet Take 1 tablet (81 mg total) by mouth daily. Swallow whole. 90 tablet 3   atorvastatin  (LIPITOR) 20 MG tablet Take 1 tablet (20 mg total) by mouth daily. 90 tablet 3   carbidopa -levodopa  (SINEMET  IR) 25-100 MG tablet Take 2 tablets by mouth 3 (three) times daily before meals.     Carbidopa -Levodopa  ER (SINEMET  CR) 25-100 MG tablet  controlled release Take 1 tablet by mouth at bedtime.     Cholecalciferol  (VITAMIN D3) 50 MCG (2000 UT) TABS Take 2,000 Units by mouth in the morning.     Cyanocobalamin  (VITAMIN B-12) 5000 MCG LOZG Take 2,500 mcg by mouth every evening.     gabapentin  (NEURONTIN ) 100 MG capsule Take 100 mg by mouth 2 (two) times daily.     midodrine  (PROAMATINE ) 2.5 MG tablet Take 1 tablet (2.5 mg total) by mouth 3 (three) times daily with meals. 270 tablet 2   Multiple Vitamins-Minerals (ADULT ONE DAILY GUMMIES PO) Take 2 tablets by mouth every evening.     Omega-3 Fatty Acids (FISH OIL PO) Take 1,000 mg by mouth in the morning.     ranolazine  (RANEXA ) 500 MG 12 hr tablet TAKE ONE TABLET BY MOUTH TWICE DAILY 180 tablet 3   tamsulosin  (FLOMAX ) 0.4 MG CAPS capsule Take 0.4 mg by mouth daily after supper.     doxycycline  (VIBRAMYCIN ) 100 MG capsule Take 100 mg by mouth 2 (two) times daily.      Physical Exam: General: Alert and oriented.  No apparent distress.  Vascular: DP/PT pulses not palpable to the left side, difficult to palpate also on the right side, capillary fill time delayed to left forefoot, no hair growth present to bilateral lower extremities.  Neuro: Light touch sensation intact bilateral lower extremities.  Derm: Dry gangrene present on the left fifth ray and fourth ray as well.  Appears to have new gangrenous changes that are present extending to the mid arch area plantarly and dorsally to the midfoot.  Left foot and leg overall appears to be less edematous and erythematous today from the ankle to proximal.        MSK: Pain on palpation left foot  Results for orders placed or performed during the hospital encounter of 08/24/24 (from the past 48 hours)  MRSA Next Gen by PCR, Nasal     Status: None   Collection Time: 08/25/24  4:45 PM   Specimen: Nasal Mucosa; Nasal Swab  Result Value Ref Range   MRSA by PCR Next Gen NOT DETECTED NOT DETECTED    Comment: (NOTE) The GeneXpert MRSA  Assay (FDA approved for NASAL specimens only), is one component of a comprehensive MRSA colonization surveillance program. It is not intended to diagnose MRSA infection nor to guide or monitor treatment for MRSA infections. Test performance is not FDA approved in patients less than 66 years old. Performed at [facility], [address] [place], [place] [address]   CBC every 6 hours x 4 post-procedure     Status: Abnormal   Collection Time: 08/25/24  5:48 PM  Result Value Ref Range   WBC 14.1 (H) 4.0 - 10.5 K/uL   RBC 3.15 (L) 4.22 - 5.81 MIL/uL   Hemoglobin 10.5 (L) 13.0 - 17.0 g/dL   HCT 68.5 (L) 60.9 - 47.9 %   MCV 99.7 80.0 - 100.0  fL   MCH 33.3 26.0 - 34.0 pg   MCHC 33.4 30.0 - 36.0 g/dL   RDW 86.7 88.4 - 84.4 %   Platelets 263 150 - 400 K/uL   nRBC 0.0 0.0 - 0.2 %    Comment: Performed at [facility], [address] [place], [place] [address]  Fibrinogen every 6 hours x 4 post-procedure     Status: Abnormal   Collection Time: 08/25/24  5:48 PM  Result Value Ref Range   Fibrinogen 657 (H) 210 - 475 mg/dL    Comment: (NOTE) Fibrinogen results may be underestimated in patients receiving thrombolytic therapy. Performed at [facility], [address] [place], [place] [address]   Heparin  level (unfractionated)     Status: Abnormal   Collection Time: 08/26/24  2:15 AM  Result Value Ref Range   Heparin  Unfractionated <0.10 (L) 0.30 - 0.70 IU/mL    Comment: (NOTE) The clinical reportable range upper limit is being lowered to >1.10 to align with the FDA approved guidance for the current laboratory assay.  If heparin  results are below expected values, and patient dosage has  been confirmed, suggest follow up testing of antithrombin III levels. Performed at [facility], [address] [place], [place] [address]   Glucose, capillary     Status: Abnormal   Collection Time: 08/26/24  4:00 AM  Result Value Ref Range    Glucose-Capillary 119 (H) 70 - 99 mg/dL    Comment: Glucose reference range applies only to samples taken after fasting for at least 8 hours.  CBC with Differential/Platelet     Status: Abnormal   Collection Time: 08/26/24  5:32 AM  Result Value Ref Range   WBC 12.8 (H) 4.0 - 10.5 K/uL   RBC 2.68 (L) 4.22 - 5.81 MIL/uL   Hemoglobin 9.0 (L) 13.0 - 17.0 g/dL   HCT 73.0 (L) 60.9 - 47.9 %   MCV 100.4 (H) 80.0 - 100.0 fL   MCH 33.6 26.0 - 34.0 pg   MCHC 33.5 30.0 - 36.0 g/dL   RDW 86.7 88.4 - 84.4 %   Platelets 222 150 - 400 K/uL   nRBC 0.0 0.0 - 0.2 %   Neutrophils Relative % 81 %   Neutro Abs 10.3 (H) 1.7 - 7.7 K/uL   Lymphocytes Relative 9 %   Lymphs Abs 1.2 0.7 - 4.0 K/uL   Monocytes Relative 8 %   Monocytes Absolute 1.1 (H) 0.1 - 1.0 K/uL   Eosinophils Relative 1 %   Eosinophils Absolute 0.1 0.0 - 0.5 K/uL   Basophils Relative 0 %   Basophils Absolute 0.0 0.0 - 0.1 K/uL   Immature Granulocytes 1 %   Abs Immature Granulocytes 0.16 (H) 0.00 - 0.07 K/uL    Comment: Performed at [facility], [address] [place], [place] [address]  Basic metabolic panel with GFR     Status: Abnormal   Collection Time: 08/26/24  5:32 AM  Result Value Ref Range   Sodium 128 (L) 135 - 145 mmol/L   Potassium 3.9 3.5 - 5.1 mmol/L   Chloride 93 (L) 98 - 111 mmol/L   CO2 23 22 - 32 mmol/L   Glucose, Bld 114 (H) 70 - 99 mg/dL    Comment: Glucose reference range applies only to samples taken after fasting for at least 8 hours.   BUN 7 (L) 8 - 23 mg/dL   Creatinine, Ser 9.57 (L) 0.61 - 1.24 mg/dL   Calcium  8.1 (L)  8.9 - 10.3 mg/dL   GFR, Estimated >39 >39 mL/min    Comment: (NOTE) Calculated using the CKD-EPI Creatinine Equation (2021)    Anion gap 12 5 - 15    Comment: Performed at [facility], [address] [place], [place] [address]  CBC every 6 hours x 4 post-procedure     Status: Abnormal   Collection Time: 08/26/24  6:54 AM  Result Value Ref Range   WBC 12.8 (H) 4.0  - 10.5 K/uL   RBC 2.61 (L) 4.22 - 5.81 MIL/uL   Hemoglobin 8.7 (L) 13.0 - 17.0 g/dL   HCT 73.9 (L) 60.9 - 47.9 %   MCV 99.6 80.0 - 100.0 fL   MCH 33.3 26.0 - 34.0 pg   MCHC 33.5 30.0 - 36.0 g/dL   RDW 86.7 88.4 - 84.4 %   Platelets 226 150 - 400 K/uL   nRBC 0.0 0.0 - 0.2 %    Comment: Performed at [facility], [address] [place], [place] [address]  Fibrinogen every 6 hours x 4 post-procedure     Status: None   Collection Time: 08/26/24  6:54 AM  Result Value Ref Range   Fibrinogen 440 210 - 475 mg/dL    Comment: (NOTE) Fibrinogen results may be underestimated in patients receiving thrombolytic therapy. Performed at [facility], [address] [place], [place] [address]   Glucose, capillary     Status: Abnormal   Collection Time: 08/26/24  8:00 AM  Result Value Ref Range   Glucose-Capillary 108 (H) 70 - 99 mg/dL    Comment: Glucose reference range applies only to samples taken after fasting for at least 8 hours.  CBC every 6 hours x 4 post-procedure     Status: Abnormal   Collection Time: 08/26/24 11:15 AM  Result Value Ref Range   WBC 13.8 (H) 4.0 - 10.5 K/uL   RBC 2.80 (L) 4.22 - 5.81 MIL/uL   Hemoglobin 9.3 (L) 13.0 - 17.0 g/dL   HCT 71.5 (L) 60.9 - 47.9 %   MCV 101.4 (H) 80.0 - 100.0 fL   MCH 33.2 26.0 - 34.0 pg   MCHC 32.7 30.0 - 36.0 g/dL   RDW 86.7 88.4 - 84.4 %   Platelets 230 150 - 400 K/uL   nRBC 0.0 0.0 - 0.2 %    Comment: Performed at [facility], [address] [place], [place] [address]  Fibrinogen every 6 hours x 4 post-procedure     Status: None   Collection Time: 08/26/24 11:15 AM  Result Value Ref Range   Fibrinogen 309 210 - 475 mg/dL    Comment: (NOTE) Fibrinogen results may be underestimated in patients receiving thrombolytic therapy. Performed at [facility], [address] [place], [place] [address]   Glucose, capillary     Status: Abnormal   Collection Time: 08/26/24 11:53 AM  Result Value  Ref Range   Glucose-Capillary 111 (H) 70 - 99 mg/dL    Comment: Glucose reference range applies only to samples taken after fasting for at least 8 hours.  Glucose, capillary     Status: Abnormal   Collection Time: 08/26/24  3:38 PM  Result Value Ref Range   Glucose-Capillary 111 (H) 70 - 99 mg/dL    Comment: Glucose reference range applies only to samples taken after fasting for at least 8 hours.  Glucose, capillary     Status: Abnormal   Collection Time: 08/26/24  7:39 PM  Result Value Ref Range  Glucose-Capillary 129 (H) 70 - 99 mg/dL    Comment: Glucose reference range applies only to samples taken after fasting for at least 8 hours.  Glucose, capillary     Status: Abnormal   Collection Time: 08/26/24 11:45 PM  Result Value Ref Range   Glucose-Capillary 135 (H) 70 - 99 mg/dL    Comment: Glucose reference range applies only to samples taken after fasting for at least 8 hours.  Magnesium      Status: None   Collection Time: 08/27/24  4:23 AM  Result Value Ref Range   Magnesium  1.8 1.7 - 2.4 mg/dL    Comment: Performed at [facility], [address] [place], [place] [address]  Phosphorus     Status: None   Collection Time: 08/27/24  4:23 AM  Result Value Ref Range   Phosphorus 2.8 2.5 - 4.6 mg/dL    Comment: Performed at [facility], [address] [place], [place] [address]  CBC with Differential/Platelet     Status: Abnormal   Collection Time: 08/27/24  4:23 AM  Result Value Ref Range   WBC 12.0 (H) 4.0 - 10.5 K/uL   RBC 2.50 (L) 4.22 - 5.81 MIL/uL   Hemoglobin 8.2 (L) 13.0 - 17.0 g/dL   HCT 74.9 (L) 60.9 - 47.9 %   MCV 100.0 80.0 - 100.0 fL   MCH 32.8 26.0 - 34.0 pg   MCHC 32.8 30.0 - 36.0 g/dL   RDW 86.7 88.4 - 84.4 %   Platelets 192 150 - 400 K/uL   nRBC 0.0 0.0 - 0.2 %   Neutrophils Relative % 81 %   Neutro Abs 9.6 (H) 1.7 - 7.7 K/uL   Lymphocytes Relative 9 %   Lymphs Abs 1.1 0.7 - 4.0 K/uL   Monocytes Relative 7 %   Monocytes Absolute 0.9  0.1 - 1.0 K/uL   Eosinophils Relative 1 %   Eosinophils Absolute 0.2 0.0 - 0.5 K/uL   Basophils Relative 0 %   Basophils Absolute 0.0 0.0 - 0.1 K/uL   Immature Granulocytes 2 %   Abs Immature Granulocytes 0.24 (H) 0.00 - 0.07 K/uL    Comment: Performed at [facility], [address] [place], [place] [address]  Basic metabolic panel with GFR     Status: Abnormal   Collection Time: 08/27/24  4:23 AM  Result Value Ref Range   Sodium 124 (L) 135 - 145 mmol/L   Potassium 3.5 3.5 - 5.1 mmol/L   Chloride 91 (L) 98 - 111 mmol/L   CO2 23 22 - 32 mmol/L   Glucose, Bld 119 (H) 70 - 99 mg/dL    Comment: Glucose reference range applies only to samples taken after fasting for at least 8 hours.   BUN 14 8 - 23 mg/dL   Creatinine, Ser 9.47 (L) 0.61 - 1.24 mg/dL   Calcium  7.7 (L) 8.9 - 10.3 mg/dL   GFR, Estimated >39 >39 mL/min    Comment: (NOTE) Calculated using the CKD-EPI Creatinine Equation (2021)    Anion gap 9 5 - 15    Comment: Performed at [facility], [address] [place], [place] [address]  Glucose, capillary     Status: Abnormal   Collection Time: 08/27/24  5:29 AM  Result Value Ref Range   Glucose-Capillary 110 (H) 70 - 99 mg/dL    Comment: Glucose reference range applies only to samples taken after fasting for at least 8 hours.  Glucose, capillary     Status:  Abnormal   Collection Time: 08/27/24 11:12 AM  Result Value Ref Range   Glucose-Capillary 139 (H) 70 - 99 mg/dL    Comment: Glucose reference range applies only to samples taken after fasting for at least 8 hours.  CBC     Status: Abnormal   Collection Time: 08/27/24 12:06 PM  Result Value Ref Range   WBC 13.1 (H) 4.0 - 10.5 K/uL   RBC 2.58 (L) 4.22 - 5.81 MIL/uL   Hemoglobin 8.6 (L) 13.0 - 17.0 g/dL   HCT 74.0 (L) 60.9 - 47.9 %   MCV 100.4 (H) 80.0 - 100.0 fL   MCH 33.3 26.0 - 34.0 pg   MCHC 33.2 30.0 - 36.0 g/dL   RDW 86.7 88.4 - 84.4 %   Platelets 208 150 - 400 K/uL   nRBC 0.0 0.0 - 0.2 %     Comment: Performed at [facility], [address], [place], [place] [address]   PERIPHERAL VASCULAR CATHETERIZATION Result Date: 08/27/2024 See surgical note for result.  DG Chest Port 1 View Result Date: 08/26/2024 EXAM: 1 VIEW(S) XRAY OF THE CHEST 08/26/2024 11:13:00 AM COMPARISON: 10/06/2022. CLINICAL HISTORY: 858119 Shortness of breath. FINDINGS: LUNGS AND PLEURA: Trace bilateral pleural effusions. Diffuse interstitial prominence with increased peripheral septal markings. . No focal pulmonary opacity. No pneumothorax. HEART AND MEDIASTINUM: Mild cardiomegaly. Atherosclerotic calcifications. BONES AND SOFT TISSUES: No acute osseous abnormality. IMPRESSION: 1. Mild diffuse interstitial edema. 2. Trace bilateral pleural effusions. 3. Mild cardiomegaly. Electronically signed by: Waddell Calk MD 08/26/2024 12:05 PM EST RP Workstation: [id number]   PERIPHERAL VASCULAR CATHETERIZATION Result Date: 08/26/2024 See surgical note for result.  PERIPHERAL VASCULAR CATHETERIZATION Result Date: 08/25/2024 See surgical note for result.   Blood pressure (!) 123/90, pulse 76, temperature 99 F (37.2 C), resp. rate 18, height 6' (1.829 m), weight 78.8 kg, SpO2 94%.  Assessment Critical limb ischemia left lower extremity -gangrene left lower extremity, dry, stable PVD Left foot gangrene  Plan - Patient seen and examined - Reviewed CT angiogram with recent interventions.  Appears to have two-vessel runoff to the foot with no flow through the anterior tibial/DP artery. -Patient's foot appears to have new gangrenous changes involving the mid arch area plantarly and to the midfoot dorsally as well making limb salvage attempts very difficult.  Patient has a substantial amount of tissue loss. - Discussed with family could possibly attempt transmetatarsal amputation but if the skin and soft tissue does not come around further then it is not likely to work.  Discussed that a transmetatarsal  amputation is attempted in this current setting cannot guarantee closure and believe patient would be left with a very large wound with nonviable tissue still present.  Would like to allow the tissues to demarcate further over the weekend and recheck on Monday to evaluate the skin integrity both dorsally and plantarly.  If the skin appears to be worsening and further necrosis ensues then would recommend more proximal amputation. - Appreciate vascular surgery recommendations. - Order placed for Betadine wet to dry dressings.  Patient could put some weight on his heel for transfers in a postoperative shoe. - Gangrenous changes appear to be stable at this time.  Appreciate medicine recommendations for possible antibiotic management.  Do not believe antibiotics are likely getting to the gangrenous tissue anyway.  Will follow back up with patient on Sunday or Monday to assess demarcation further.  Prentice Lee, DPM 08/27/2024, 1:05 PM          [1] No  Known Allergies

## 2024-08-27 NOTE — Interval H&P Note (Signed)
 History and Physical Interval Note:  08/27/2024 7:57 AM  Nicolas Solis Medicine  has presented today for surgery, with the diagnosis of left leg ischemia.  The various methods of treatment have been discussed with the patient and family. After consideration of risks, benefits and other options for treatment, the patient has consented to  Procedures: LOWER EXTREMITY INTERVENTION (Left) as a surgical intervention.  The patient's history has been reviewed, patient examined, no change in status, stable for surgery.  I have reviewed the patient's chart and labs.  Questions were answered to the patient's satisfaction.     [name]

## 2024-08-27 NOTE — Anesthesia Procedure Notes (Signed)
 Procedure Name: Intubation Date/Time: 08/27/2024 8:18 AM  Performed by: Jackye Spanner, CRNAPre-anesthesia Checklist: Patient identified, Patient being monitored, Timeout performed, Emergency Drugs available and Suction available Patient Re-evaluated:Patient Re-evaluated prior to induction Oxygen Delivery Method: Circle system utilized Preoxygenation: Pre-oxygenation with 100% oxygen Induction Type: IV induction Ventilation: Mask ventilation without difficulty Laryngoscope Size: 3 and McGrath Grade View: Grade I Tube type: Oral Tube size: 7.0 mm Number of attempts: 1 Airway Equipment and Method: Stylet Placement Confirmation: ETT inserted through vocal cords under direct vision, positive ETCO2 and breath sounds checked- equal and bilateral Secured at: 22 cm Tube secured with: Tape Dental Injury: Teeth and Oropharynx as per pre-operative assessment  Comments: Smooth atraumatic intubation, no complications noted.

## 2024-08-27 NOTE — Transfer of Care (Signed)
 Immediate Anesthesia Transfer of Care Note  Patient: Nicolas Solis Medicine  Procedure(s) Performed: LOWER EXTREMITY INTERVENTION (Left)  Patient Location: PACU  Anesthesia Type:General  Level of Consciousness: awake, alert , and drowsy  Airway & Oxygen Therapy: Patient Spontanous Breathing and Patient connected to face mask oxygen  Post-op Assessment: Report given to RN and Post -op Vital signs reviewed and stable  Post vital signs: Reviewed and stable  Last Vitals:  Vitals Value Taken Time  BP 114/58 08/27/24 09:30  Temp 36.1 0926  Pulse 77 08/27/24 09:31  Resp 12 08/27/24 09:31  SpO2 96 % 08/27/24 09:31  Vitals shown include unfiled device data.  Last Pain:  Vitals:   08/27/24 0739  TempSrc: Oral  PainSc: 8       Patients Stated Pain Goal: 0 (08/27/24 0739)  Complications: There were no known notable events for this encounter.

## 2024-08-27 NOTE — Progress Notes (Signed)
 " Progress Note   Patient: Nicolas Solis FMW:[id number] DOB: Jun 18, 1951 DOA: 08/24/2024     3 DOS: the patient was seen and examined on 08/27/2024     Brief hospital course: From HPI Nicolas Solis is a 74 y.o. male with medical history significant of coronary artery disease, Parkinson's disease presenting to the emergency department for evaluation of worsening pain, swelling, and discoloration of his left foot.  History is per his wife, present at bedside.  He has had numbness and tingling of his left leg from the knee throughout his foot since December.  At some point he developed an ulceration at the base of his fifth toe on the left that has been increasing in size for the past few weeks. No known injury or drainage from the wound. He started having chills 2 nights ago. Denies other systemic symptoms.     He's taken a course of antibiotics and was prescribed gabapentin  without improvement.    He saw Podiatry about two weeks ago and was referred to Vascular Surgery but has not yet seen them. He was in for follow up with PCP today and was referred to the ED given concern for acute limb ischemia    In the ED he met SIRS criteria with WBC and tachycardia. BP soft initially but fluid responsive.  XR left foot without evidence of OM. Bedside ABI  0.54.  Vascular surgery was consulted.  He was started on heparin  drip and antibiotics with plan for angiography tomorrow. Hospitalist consulted for admission      Assessment and Plan:    Atherosclerosis obliterans with ulceration  Continue heparin  Vascular surgery to patient for angiography on 08/27/2024 Podiatry following   SIRS Left foot ulceration with surrounding cellulitis  - leukocytosis and tachycardia, otherwise hemodynamically stable Continue current antibiotics Vascular surgeon and podiatry on board   New onset A-fib with RVR Patient on heparin  drip Amiodarone  drip have been switched to oral since patient is seen sinus  rhythm Cardiology is on board and case discussed   Acute on chronic macrocytic anemia  - Hgb slowly owndrending from 12.5 in December 2025  - f/u folate, B12 - monitor/transfuse as indicated    Hyponatremia, mild  Sodium level improving   Parkinson's disease  Dementia with behavioral disturbance Continue delirium precaution Currently on Precedex  drip   CAD HLD Continue home statin therapy      Advance Care Planning:   Code Status: Full Code  Discussed with patient at time of admission.    Consults: Vascular Surgery    Family Communication: Wife at bedside      Subjective:  Patient looking much better today Denies nausea vomiting abdominal pain chest pain cough Podiatry planning possible  Physical Exam:   General: He is not in acute distress.    Appearance: He is not toxic-appearing.  Eyes:     Extraocular Movements: Extraocular movements intact.     Pupils: Pupils are equal, round, and reactive to light.  Cardiovascular:     Rate and Rhythm: Normal rate and regular rhythm.  Pulmonary:     Effort: Pulmonary effort is normal.     Breath sounds: Normal breath sounds.  Abdominal:     General: There is no distension.     Palpations: Abdomen is soft.     Tenderness: There is no abdominal tenderness.  Musculoskeletal:    Dressing noted to the left leg appears clean and dry Neurological: Tremors noted to the left upper extremity   Data Reviewed:  Latest Ref Rng & Units 08/27/2024    5:09 PM 08/27/2024   12:06 PM 08/27/2024    4:23 AM  CBC  WBC 4.0 - 10.5 K/uL 13.0  13.1  12.0   Hemoglobin 13.0 - 17.0 g/dL 8.8  8.6  8.2   Hematocrit 39.0 - 52.0 % 25.7  25.9  25.0   Platelets 150 - 400 K/uL 203  208  192        Latest Ref Rng & Units 08/27/2024    4:23 AM 08/26/2024    5:32 AM 08/25/2024    5:29 AM  BMP  Glucose 70 - 99 mg/dL 880  885  892   BUN 8 - 23 mg/dL 14  7  12    Creatinine 0.61 - 1.24 mg/dL 9.47  9.57  9.46   Sodium 135 - 145 mmol/L 124  128  130    Potassium 3.5 - 5.1 mmol/L 3.5  3.9  3.7   Chloride 98 - 111 mmol/L 91  93  95   CO2 22 - 32 mmol/L 23  23  21    Calcium  8.9 - 10.3 mg/dL 7.7  8.1  8.3    Vitals:   08/27/24 1000 08/27/24 1015 08/27/24 1030 08/27/24 1200  BP: 104/65 103/80 113/60 (!) 123/90  Pulse: 76 77 76 76  Resp: 15 19 20 18   Temp: 99 F (37.2 C)   97.7 F (36.5 C)  TempSrc:    Oral  SpO2: 97% 97% 97% 94%  Weight:      Height:         Author: Drue ONEIDA Potter, MD 08/27/2024 5:29 PM  For on call review [email or web address].  "

## 2024-08-27 NOTE — Progress Notes (Signed)
 Transport in room to take patient to specials. Specials made aware of pain score by RN. Specials to give pain medication.

## 2024-08-27 NOTE — Anesthesia Postprocedure Evaluation (Signed)
"   Anesthesia Post Note  Patient: Nicolas Solis Medicine  Procedure(s) Performed: LOWER EXTREMITY INTERVENTION (Left)  Patient location during evaluation: PACU Anesthesia Type: General Level of consciousness: awake and alert, oriented and patient cooperative Pain management: pain level controlled Vital Signs Assessment: post-procedure vital signs reviewed and stable Respiratory status: spontaneous breathing, nonlabored ventilation and respiratory function stable Cardiovascular status: blood pressure returned to baseline and stable Postop Assessment: adequate PO intake Anesthetic complications: no   There were no known notable events for this encounter.   Last Vitals:  Vitals:   08/27/24 0949 08/27/24 1000  BP: (!) 102/59 104/65  Pulse: 75 76  Resp: 19 15  Temp:  37.2 C  SpO2: 94% 97%    Last Pain:  Vitals:   08/27/24 1003  TempSrc:   PainSc: 5                  Alfonso Ruths      "

## 2024-08-27 NOTE — Consult Note (Signed)
 " Cardiology Consultation:  Patient ID: [name] MRN: [id number]; DOB: 1950-12-07  Admit date: 08/24/2024 Date of Consult: 08/27/2024  Primary Care Provider: Sadie Manna, MD Primary Cardiologist: Redell Cave, MD  Primary Electrophysiologist:  None   Patient Profile:  Nicolas Solis is a 74 y.o. male with a hx of PAD, Parkinson disease, CAD who is being seen today for the evaluation of rapid atrial fibrillation in the setting of critical limb ischemia.  History of Present Illness:  [name] presents with critical limb ischemia.  He was undergoing revascularization attempt 08/26/2024 during which he developed rapid atrial fibrillation which is a new diagnosis.  Amiodarone  and heparin  were started.  At the time of consult, he has converted back to sinus rhythm.  Labs show stable anemia with hemoglobin 8.6, leukocytosis 13.1, creatinine 0.52.  He has known CAD with a CTO of his RCA.  Previously normal ejection fraction.  Past Medical History: Past Medical History:  Diagnosis Date   Compression fracture of first cervical vertebra (HCC)    Essential tremor    History of hip replacement    [facility])     Past Surgical History: Past Surgical History:  Procedure Laterality Date   25 GAUGE PARS PLANA VITRECTOMY WITH 20 GAUGE MVR PORT Right 03/18/2017   25 GAUGE PARS PLANA VITRECTOMY WITH 20 GAUGE MVR PORT FOR MACULAR HOLE Right 03/18/2017   Procedure: 25 GAUGE PARS PLANA VITRECTOMY WITH 20 GAUGE MVR PORT FOR MACULAR HOLE; PHOTOCOAGULATION RIGHT EYE;  Surgeon: Alvia Norleen BIRCH, MD;  Location: [facility] OR;  Service: Ophthalmology;  Laterality: Right;   APPENDECTOMY     COLONOSCOPY W/ POLYPECTOMY     GAS INSERTION Right 03/18/2017   Procedure: INSERTION OF GAS RIGHT (C3F8);  Surgeon: Alvia Norleen BIRCH, MD;  Location: [facility] OR;  Service: Ophthalmology;  Laterality: Right;   GAS/FLUID EXCHANGE Right 03/18/2017   Procedure: GAS/FLUID EXCHANGE RIGHT EYE;  Surgeon: Alvia Norleen BIRCH,  MD;  Location: [facility] OR;  Service: Ophthalmology;  Laterality: Right;   JOINT REPLACEMENT Right    Hip   LEFT HEART CATH AND CORONARY ANGIOGRAPHY N/A 02/06/2022   Procedure: LEFT HEART CATH AND CORONARY ANGIOGRAPHY;  Surgeon: Darron Deatrice LABOR, MD;  Location: [facility] INVASIVE CV LAB;  Service: Cardiovascular;  Laterality: N/A;   LOWER EXTREMITY ANGIOGRAPHY Left 08/25/2024   Procedure: Lower Extremity Angiography;  Surgeon: Marea Selinda RAMAN, MD;  Location: [facility] INVASIVE CV LAB;  Service: Cardiovascular;  Laterality: Left;   LOWER EXTREMITY INTERVENTION Left 08/26/2024   Procedure: LOWER EXTREMITY INTERVENTION;  Surgeon: Marea Selinda RAMAN, MD;  Location: [facility] INVASIVE CV LAB;  Service: Cardiovascular;  Laterality: Left;   LOWER EXTREMITY INTERVENTION Left 08/27/2024   Procedure: LOWER EXTREMITY INTERVENTION;  Surgeon: Marea Selinda RAMAN, MD;  Location: [facility] INVASIVE CV LAB;  Service: Cardiovascular;  Laterality: Left;   MEMBRANE PEEL Right 03/18/2017   Procedure: MEMBRANE PEEL RIGHT EYE;  Surgeon: Alvia Norleen BIRCH, MD;  Location: [facility] OR;  Service: Ophthalmology;  Laterality: Right;   MOUTH SURGERY     PHOTOCOAGULATION Left 03/18/2017   Procedure: PHOTOCOAGULATION LEFT EYE;  Surgeon: Alvia Norleen BIRCH, MD;  Location: [facility] OR;  Service: Ophthalmology;  Laterality: Left;   SERUM PATCH Right 03/18/2017   Procedure: SERUM PATCH RIGHT EYE;  Surgeon: Alvia Norleen BIRCH, MD;  Location: [facility] OR;  Service: Ophthalmology;  Laterality: Right;     Allergies:    Allergies[1]  Social History:   Social History   Socioeconomic History   Marital status: Married  Spouse name: Sheryl   Number of children: Not on file   Years of education: Not on file   Highest education level: Not on file  Occupational History   Not on file  Tobacco Use   Smoking status: Former    Current packs/day: 0.00    Types: Cigarettes    Start date: 10/1965    Quit date: 10/2015    Years since quitting: 8.8    Passive exposure: Past   Smokeless tobacco:  Never  Vaping Use   Vaping status: Never Used  Substance and Sexual Activity   Alcohol use: Yes    Alcohol/week: 7.0 standard drinks of alcohol    Types: 7 Shots of liquor per week   Drug use: No   Sexual activity: Not Currently  Other Topics Concern   Not on file  Social History Narrative   Not on file   Social Drivers of Health   Tobacco Use: Medium Risk (08/25/2024)   Patient History    Smoking Tobacco Use: Former    Smokeless Tobacco Use: Never    Passive Exposure: Past  Physicist, Medical Strain: Low Risk  (08/10/2024)   Received from [facility]   Overall Financial Resource Strain (CARDIA)    Difficulty of Paying Living Expenses: Not hard at all  Food Insecurity: No Food Insecurity (08/25/2024)   Epic    Worried About Radiation Protection Practitioner of Food in the Last Year: Never true    Ran Out of Food in the Last Year: Never true  Transportation Needs: No Transportation Needs (08/25/2024)   Epic    Lack of Transportation (Medical): No    Lack of Transportation (Non-Medical): No  Physical Activity: Not on file  Stress: Not on file  Social Connections: Moderately Isolated (08/25/2024)   Social Connection and Isolation Panel    Frequency of Communication with Friends and Family: Once a week    Frequency of Social Gatherings with Friends and Family: Once a week    Attends Religious Services: More than 4 times per year    Active Member of [employer] or Organizations: No    Attends Banker Meetings: Never    Marital Status: Married  Catering Manager Violence: Not At Risk (08/25/2024)   Epic    Fear of Current or Ex-Partner: No    Emotionally Abused: No    Physically Abused: No    Sexually Abused: No  Depression (PHQ2-9): Not on file  Alcohol Screen: Not on file  Housing: Low Risk (08/25/2024)   Epic    Unable to Pay for Housing in the Last Year: No    Number of Times Moved in the Last Year: 0    Homeless in the Last Year: No  Utilities: Not At Risk  (08/25/2024)   Epic    Threatened with loss of utilities: No  Health Literacy: Not on file     Family History:    Family History  Problem Relation Age of Onset   Stroke Brother      ROS:  All other ROS reviewed and negative. Pertinent positives noted in the HPI.     Physical Exam/Data:   Vitals:   08/27/24 1000 08/27/24 1015 08/27/24 1030 08/27/24 1200  BP: 104/65 103/80 113/60 (!) 123/90  Pulse: 76 77 76 76  Resp: 15 19 20 18   Temp: 99 F (37.2 C)     TempSrc:      SpO2: 97% 97% 97% 94%  Weight:      Height:  Intake/Output Summary (Last 24 hours) at 08/27/2024 1431 Last data filed at 08/27/2024 1028 Gross per 24 hour  Intake 3087.95 ml  Output 557 ml  Net 2530.95 ml       08/27/2024    4:27 AM 08/24/2024    1:26 PM 02/27/2024   10:05 AM  Last 3 Weights  Weight (lbs) 173 lb 11.6 oz 170 lb 167 lb  Weight (kg) 78.8 kg 77.111 kg 75.751 kg    Body mass index is 23.56 kg/m.  General: no acute distress  Neck: no JVD Cardiac: Normal S1, S2; RRR; no murmurs, rubs, or gallops Lungs: Clear to auscultation bilaterally, no wheezing, rhonchi or rales  Ext: No edema, pulses 2+ Skin: Warm and dry Neuro: Alert and oriented to person, place, time, and situation Psych: Normal mood and affect   EKG:  The EKG was personally reviewed and demonstrates: Rapid atrial fibrillation Telemetry:  Telemetry was personally reviewed and demonstrates: Sinus rhythm  Relevant CV Studies: -TTE pending - TTE 01/2022 LVEF 70-75%, normal RV size and function, no significant valvular disease - Cath 01/2022 CTO RCA, 40% mid LCx, 30% ostial LAD  Assessment and Plan:  Rapid atrial fibrillation Paroxysmal atrial fibrillation Patient presents with new diagnosis of rapid atrial fibrillation that occurred during a revascularization attempt for critical limb ischemia.  He has since converted back to sinus rhythm.  Suspect this was probably provoked by his acute illness.  CHA2DS2-VASc of at least  4.  Plan: - Can stop amiodarone  gtt since he is in sinus rhythm; will complete a 5 gram oral load and then continue 200mg  thereafter for maintenance - Echo pending - Would start anticoagulation once able from a vascular surgery standpoint; would not start currently while also getting tirofiban  - We will plan to see him in clinic and decide on utility of ongoing amiodarone  therapy - Thyroid function and LFTs  CAD HLD Critical limb ischemia Known CTO of RCA from cath 01/2022.  Last LDL 39 06/2024.  At goal.  No angina.  Plan: - Continue ASA 81 mg daily - Lipitor 20 mg daily   For questions or updates, please contact [facility] Please consult [email or web address] for contact info under    Signed, Caron Poser, MD  [facility]  [facility]  08/27/2024 2:31 PM     [1] No Known Allergies  "

## 2024-08-27 NOTE — Progress Notes (Signed)
 OT Cancellation Note  Patient Details Name: VASH QUEZADA MRN: [id number] DOB: [birth year]   Cancelled Treatment:    Reason Eval/Treat Not Completed: Patient at procedure or test/ unavailable. Pt currently off unit at cath lab for procedure. Evaluation held today and will re-attempt when pt is able to actively participate.  Izetta Claude, MS, OTR/L , CBIS ascom [phone]  08/27/24, 8:48 AM

## 2024-08-27 NOTE — Anesthesia Preprocedure Evaluation (Addendum)
"                                    Anesthesia Evaluation  Patient identified by MRN, date of birth, ID band Patient awake    Reviewed: Allergy & Precautions, NPO status , Patient's Chart, lab work & pertinent test results  History of Anesthesia Complications Negative for: history of anesthetic complications  Airway Mallampati: III   Neck ROM: Full    Dental  (+) Missing   Pulmonary former smoker (quit 2007)   Pulmonary exam normal breath sounds clear to auscultation       Cardiovascular + CAD and + Peripheral Vascular Disease  Normal cardiovascular exam+ dysrhythmias (a fib, new this admission; currently in NSR)  Rhythm:Regular Rate:Normal  ECG 08/26/24:  Atrial fibrillation  Right bundle branch block   Neuro/Psych Essential tremor  Neuromuscular disease (Parkinson disease)    GI/Hepatic negative GI ROS,,,  Endo/Other  negative endocrine ROS    Renal/GU negative Renal ROS     Musculoskeletal   Abdominal   Peds  Hematology negative hematology ROS (+)   Anesthesia Other Findings   Reproductive/Obstetrics                              Anesthesia Physical Anesthesia Plan  ASA: 3  Anesthesia Plan: General   Post-op Pain Management:    Induction: Intravenous  PONV Risk Score and Plan: 2 and Ondansetron , Dexamethasone  and Treatment may vary due to age or medical condition  Airway Management Planned: Oral ETT  Additional Equipment:   Intra-op Plan:   Post-operative Plan: Extubation in OR  Informed Consent: I have reviewed the patients History and Physical, chart, labs and discussed the procedure including the risks, benefits and alternatives for the proposed anesthesia with the patient or authorized representative who has indicated his/her understanding and acceptance.     Dental advisory given  Plan Discussed with: CRNA  Anesthesia Plan Comments: (Patient consented for risks of anesthesia including but not  limited to:  - adverse reactions to medications - damage to eyes, teeth, lips or other oral mucosa - nerve damage due to positioning  - sore throat or hoarseness - damage to heart, brain, nerves, lungs, other parts of body or loss of life  Informed patient about role of CRNA in peri- and intra-operative care.  Patient voiced understanding.)         Anesthesia Quick Evaluation  "

## 2024-08-27 NOTE — Op Note (Signed)
 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  Left lower extremity angiogram             2.  Percutaneous transluminal angioplasty of left peroneal artery with 3 mm diameter by 15 cm length angioplasty balloon for complex, occlusive lesion             3.  Stent placement x 2 to the left SFA and popliteal arteries for treatment of popliteal artery aneurysm as well as severe atherosclerotic occlusive disease with subtotal occlusion after thrombolytic therapy with a 7 mm diameter by 25 cm length Viabahn stent and an 8 mm diameter by 15 cm length Viabahn stent             4.  Stent placement to the tibioperoneal trunk with 3.75 mm diameter by 38 mm length Esprit stent             5.  StarClose closure device right femoral artery  EBL: 5 cc  Contrast: 40 cc  Fluoro Time: 6.9 minutes  Anesthesia: General              Indications:  Patient is a 73 y.o.male with an ischemic left lower extremity with rest pain and ulceration.  The patient has been on thrombolytic therapy for the last 2 days with a thrombosed popliteal aneurysm and severe atherosclerotic occlusive disease.  The patient is brought in for angiography for further evaluation and potential treatment.  Due to the limb threatening nature of the situation, angiogram was performed for attempted limb salvage. The patient is aware that if the procedure fails, amputation would be expected.  The patient also understands that even with successful revascularization, amputation may still be required due to the severity of the situation.  Risks and  benefits are discussed and informed consent is obtained.   Procedure:  The patient was identified and appropriate procedural time out was performed.  The patient was then placed supine on the table and prepped and draped in the usual sterile fashion.  Anesthesia provided sedation due to continuous motion on previous attempts with moderate conscious sedation.  This was done throughout from the start of the procedure until the patient was taken to the recovery room.  The existing lysis catheter was removed and a V18 wire was placed.  Selective left lower extremity angiogram was then performed. This demonstrated resolution of the vast majority of thrombus in the left SFA, popliteal artery, and tibial vessels after thrombolytic therapy.  There was still an occlusion in the proximal peroneal artery and the very large popliteal aneurysm which would need treated with covered stents.  There is atherosclerotic occlusive disease just proximal to the SFA/popliteal aneurysm with a continued near total occlusion. It was felt that it was in the patient's best interest to proceed with intervention after these images to avoid a second procedure and a larger amount of contrast and fluoroscopy based off of the findings from the initial angiogram. The patient was systemically heparinized and a 7 French Ansell sheath was then placed over the [employer] wire. I then performed angioplasty of the peroneal artery with a 3  mm diameter by 15 cm length angioplasty balloon inflated to 12 atm for 1 minute.  The popliteal aneurysm was successfully excluded as well as the atherosclerotic occlusive disease with a 7 mm diameter by 25 cm length Viabahn stent and an 8 mm diameter by 15 cm length Viabahn stent.  This started in the proximal to mid SFA and a relatively normal area of the vessel and terminated in the distal popliteal artery just below the aneurysm and just above the takeoff of the anterior tibial artery.  These were postdilated  with 7 mm balloon distally and 8 mm balloon proximally with excellent angiograph completion result.  There was now brisk flow through the stents with no residual stenosis and successful exclusion of the popliteal aneurysms.  There was still disease in the peroneal artery and I elected to place a 3.7 mm diameter by 38 mm length Esprit stent after exchanging for a 0.014 wire.  This was postdilated with a 4 mm balloon with excellent angiograph completion result and less than 10% residual stenosis.  He also had some flow through a diseased anterior tibial artery giving him two-vessel runoff distally.  I elected to terminate the procedure. The sheath was removed and StarClose closure device was deployed in the right femoral artery with excellent hemostatic result. The patient was taken to the recovery room in stable condition having tolerated the procedure well.  Findings:                            Left Lower Extremity: Thrombolytic therapy was able to remove thrombus and he now has two-vessel runoff distally although the anterior tibial artery remains diseased.  Thrombosed popliteal aneurysm has been successfully revascularized and excluded with covered stents.   Disposition: Patient was taken to the recovery room in stable condition having tolerated the procedure well.  Complications: None  Selinda Gu 08/27/2024 9:10 AM   This note was created with Dragon Medical transcription system. Any errors in dictation are purely unintentional.

## 2024-08-28 ENCOUNTER — Inpatient Hospital Stay: Admit: 2024-08-28 | Discharge: 2024-08-28 | Disposition: A | Attending: Vascular Surgery | Admitting: Vascular Surgery

## 2024-08-28 ENCOUNTER — Encounter: Admission: EM | Payer: Self-pay | Source: Home / Self Care | Attending: Emergency Medicine

## 2024-08-28 DIAGNOSIS — I70222 Atherosclerosis of native arteries of extremities with rest pain, left leg: Secondary | ICD-10-CM | POA: Diagnosis not present

## 2024-08-28 DIAGNOSIS — I050 Rheumatic mitral stenosis: Secondary | ICD-10-CM

## 2024-08-28 DIAGNOSIS — I2510 Atherosclerotic heart disease of native coronary artery without angina pectoris: Secondary | ICD-10-CM | POA: Diagnosis not present

## 2024-08-28 DIAGNOSIS — E782 Mixed hyperlipidemia: Secondary | ICD-10-CM | POA: Diagnosis not present

## 2024-08-28 DIAGNOSIS — I4891 Unspecified atrial fibrillation: Secondary | ICD-10-CM

## 2024-08-28 DIAGNOSIS — I480 Paroxysmal atrial fibrillation: Secondary | ICD-10-CM | POA: Diagnosis not present

## 2024-08-28 DIAGNOSIS — L97529 Non-pressure chronic ulcer of other part of left foot with unspecified severity: Secondary | ICD-10-CM | POA: Diagnosis not present

## 2024-08-28 LAB — CBC WITH DIFFERENTIAL/PLATELET
Abs Immature Granulocytes: 0.26 10*3/uL — ABNORMAL HIGH (ref 0.00–0.07)
Basophils Absolute: 0 10*3/uL (ref 0.0–0.1)
Basophils Relative: 0 %
Eosinophils Absolute: 0 10*3/uL (ref 0.0–0.5)
Eosinophils Relative: 0 %
HCT: 24.5 % — ABNORMAL LOW (ref 39.0–52.0)
Hemoglobin: 8.1 g/dL — ABNORMAL LOW (ref 13.0–17.0)
Immature Granulocytes: 2 %
Lymphocytes Relative: 5 %
Lymphs Abs: 0.8 10*3/uL (ref 0.7–4.0)
MCH: 33.2 pg (ref 26.0–34.0)
MCHC: 33.1 g/dL (ref 30.0–36.0)
MCV: 100.4 fL — ABNORMAL HIGH (ref 80.0–100.0)
Monocytes Absolute: 0.8 10*3/uL (ref 0.1–1.0)
Monocytes Relative: 6 %
Neutro Abs: 13.1 10*3/uL — ABNORMAL HIGH (ref 1.7–7.7)
Neutrophils Relative %: 87 %
Platelets: 211 10*3/uL (ref 150–400)
RBC: 2.44 MIL/uL — ABNORMAL LOW (ref 4.22–5.81)
RDW: 13.2 % (ref 11.5–15.5)
WBC: 15 10*3/uL — ABNORMAL HIGH (ref 4.0–10.5)
nRBC: 0 % (ref 0.0–0.2)

## 2024-08-28 LAB — BASIC METABOLIC PANEL WITH GFR
Anion gap: 9 (ref 5–15)
BUN: 11 mg/dL (ref 8–23)
CO2: 24 mmol/L (ref 22–32)
Calcium: 7.7 mg/dL — ABNORMAL LOW (ref 8.9–10.3)
Chloride: 96 mmol/L — ABNORMAL LOW (ref 98–111)
Creatinine, Ser: 0.42 mg/dL — ABNORMAL LOW (ref 0.61–1.24)
GFR, Estimated: >60 mL/min
Glucose, Bld: 143 mg/dL — ABNORMAL HIGH (ref 70–99)
Potassium: 3.9 mmol/L (ref 3.5–5.1)
Sodium: 129 mmol/L — ABNORMAL LOW (ref 135–145)

## 2024-08-28 LAB — HEPARIN LEVEL (UNFRACTIONATED): Heparin Unfractionated: 0.38 [IU]/mL (ref 0.30–0.70)

## 2024-08-28 LAB — ECHOCARDIOGRAM COMPLETE
AR max vel: 1.82 cm2
AV Area VTI: 1.96 cm2
AV Area mean vel: 1.77 cm2
AV Mean grad: 8 mmHg
AV Peak grad: 15.7 mmHg
Ao pk vel: 1.98 m/s
Area-P 1/2: 2.25 cm2
Height: 72 in
MV VTI: 1.11 cm2
S' Lateral: 2.8 cm
Weight: 2895.96 [oz_av]

## 2024-08-28 LAB — GLUCOSE, CAPILLARY
Glucose-Capillary: 167 mg/dL — ABNORMAL HIGH (ref 70–99)
Glucose-Capillary: 150 mg/dL — ABNORMAL HIGH (ref 70–99)

## 2024-08-28 MED ORDER — TAMSULOSIN HCL 0.4 MG PO CAPS
0.4000 mg | ORAL_CAPSULE | Freq: Every day | ORAL | Status: AC
  Administered 2024-08-28 – 2024-09-03 (×5): 0.4 mg via ORAL
  Filled 2024-08-28 (×8): qty 1

## 2024-08-28 MED ORDER — HEPARIN (PORCINE) 25000 UT/250ML-% IV SOLN
1950.0000 [IU]/h | INTRAVENOUS | Status: DC
  Administered 2024-08-28 – 2024-08-31 (×7): 1800 [IU]/h via INTRAVENOUS
  Administered 2024-09-01: 1950 [IU]/h via INTRAVENOUS
  Filled 2024-08-28 (×8): qty 250

## 2024-08-28 MED ORDER — HEPARIN BOLUS VIA INFUSION
4000.0000 [IU] | Freq: Once | INTRAVENOUS | Status: AC
  Administered 2024-08-28: 4000 [IU] via INTRAVENOUS
  Filled 2024-08-28: qty 4000

## 2024-08-28 NOTE — Evaluation (Signed)
 Physical Therapy Evaluation Patient Details Name: Nicolas Solis MRN: [id number] DOB: Jun 27, 1951 Today's Date: 08/28/2024  History of Present Illness  74 year old male presented to the ED with worsening pain, swelling, discoloration of L foot; admitted with SRIS from left foot ulercation with surrounding cellulitis, new onset afib with RVR; now s/p  LEFT SFA/Pop Stenting;TP trunk stenting 08/27/24         PMH significant for coronary artery disease, Parkinson's disease, dementia with behavioral disturbance  Clinical Impression  Pt pleasant and willing to work with PT but was functionally very limited and needed considerable assist with bed mobility, transfers and generally needed a lot of cuing and reinforcement.  Pt assisted with and educated on donning post-op shoe as well as heel only WBing goal.  He was able to stand and do some side shuffling steps from bed to recliner with heavy walker reliance and constant assist but generally was quick to fatigue and lacked a lot of confidence.  Pt overall showed good effort but is functionally limited, will benefit from continued PT to address these functional deficits.        If plan is discharge home, recommend the following: A lot of help with walking and/or transfers;A little help with bathing/dressing/bathroom;Assistance with cooking/housework;Assist for transportation;Help with stairs or ramp for entrance   Can travel by private vehicle   No    Equipment Recommendations  (TBD at rehab)  Recommendations for Other Services       Functional Status Assessment Patient has had a recent decline in their functional status and demonstrates the ability to make significant improvements in function in a reasonable and predictable amount of time.     Precautions / Restrictions Precautions Precautions: Fall Recall of Precautions/Restrictions: Impaired Required Braces or Orthoses: Other Brace Restrictions Weight Bearing Restrictions Per Provider  Order: Yes LLE Weight Bearing Per Provider Order:  (heel WBing with shoe per podiatry 1/31) Other Position/Activity Restrictions: limited walking/transfers only per podiatry 1/31      Mobility  Bed Mobility Overal bed mobility: Needs Assistance Bed Mobility: Supine to Sit     Supine to sit: Mod assist, HOB elevated, Used rails     General bed mobility comments: good effort but unable to initiate upward movement w/o HHA, heavier assist to complete the task    Transfers Overall transfer level: Needs assistance Equipment used: Rolling walker (2 wheels) Transfers: Sit to/from Stand, Bed to chair/wheelchair/BSC Sit to Stand: Mod assist, From elevated surface Stand pivot transfers: Mod assist         General transfer comment: Pt struggled to intiaite upward movement to get to standing, needed constant cuing and assist to manipulate the walker, side shuffle to EOB and generally close assist to insure safety as well as VCs to insure heel WBing with the post-op shoe donned    Ambulation/Gait               General Gait Details: no true ambulation, shuffling to turn 90* to the recliner  Stairs            Wheelchair Mobility     Tilt Bed    Modified Rankin (Stroke Patients Only)       Balance Overall balance assessment: Needs assistance Sitting-balance support: Single extremity supported Sitting balance-Leahy Scale: Good     Standing balance support: Bilateral upper extremity supported Standing balance-Leahy Scale: Poor Standing balance comment: reliant on walker, forward flexed posture, weak with some tremoring  Pertinent Vitals/Pain Pain Assessment Pain Assessment: 0-10 Pain Score: 7  Pain Location: L foot    Home Living Family/patient expects to be discharged to:: Private residence Living Arrangements: Spouse/significant other Available Help at Discharge: Family;Available 24 hours/day Type of Home:  House Home Access: Level entry       Home Layout: Able to live on main level with bedroom/bathroom Home Equipment: Rolling Walker (2 wheels);Rollator (4 wheels);Cane - single point;Shower seat;BSC/3in1      Prior Function Prior Level of Function : Needs assist             Mobility Comments: until approx 1 month ago, pt was amb with AD household/community distances; participating in rock stedy boxing, significant decrease in overall function in the last month with pt amb limited distances around house ADLs Comments: was MOD I-I in ADL prior to approx 1 month ago, now requiring some assist for ADLs, wife assist with IADLs     Extremity/Trunk Assessment   Upper Extremity Assessment Upper Extremity Assessment: Generalized weakness    Lower Extremity Assessment Lower Extremity Assessment: Generalized weakness       Communication   Communication Communication: Impaired Factors Affecting Communication: Reduced clarity of speech    Cognition Arousal: Alert Behavior During Therapy: Flat affect                             Following commands: Impaired Following commands impaired: Follows one step commands with increased time     Cueing Cueing Techniques: Verbal cues, Tactile cues     General Comments General comments (skin integrity, edema, etc.): DOE, some shortness of breath pre and (more so) post activity, SpO2 remains in the 88-92% range t/o session on room air    Exercises     Assessment/Plan    PT Assessment Patient needs continued PT services  PT Problem List Decreased strength;Decreased range of motion;Decreased activity tolerance;Decreased balance;Decreased mobility;Decreased knowledge of use of DME;Decreased safety awareness;Pain;Cardiopulmonary status limiting activity       PT Treatment Interventions DME instruction;Gait training;Functional mobility training;Therapeutic activities;Therapeutic exercise;Balance training;Neuromuscular  re-education;Patient/family education    PT Goals (Current goals can be found in the Care Plan section)  Acute Rehab PT Goals Patient Stated Goal: get walking better again PT Goal Formulation: With patient Time For Goal Achievement: 09/10/24 Potential to Achieve Goals: Fair    Frequency Min 2X/week     Co-evaluation               AM-PAC PT 6 Clicks Mobility  Outcome Measure Help needed turning from your back to your side while in a flat bed without using bedrails?: A Little Help needed moving from lying on your back to sitting on the side of a flat bed without using bedrails?: A Lot Help needed moving to and from a bed to a chair (including a wheelchair)?: A Lot Help needed standing up from a chair using your arms (e.g., wheelchair or bedside chair)?: A Lot Help needed to walk in hospital room?: Total Help needed climbing 3-5 steps with a railing? : Total 6 Click Score: 11    End of Session Equipment Utilized During Treatment: Gait belt Activity Tolerance: Patient tolerated treatment well Patient left: with call bell/phone within reach;in chair;with family/visitor present Nurse Communication: Mobility status;Precautions PT Visit Diagnosis: Unsteadiness on feet (R26.81);Muscle weakness (generalized) (M62.81);Difficulty in walking, not elsewhere classified (R26.2);Pain Pain - Right/Left: Left Pain - part of body: Ankle and joints of foot  Time: 8448-8379 PT Time Calculation (min) (ACUTE ONLY): 29 min   Charges:   PT Evaluation $PT Eval Low Complexity: 1 Low PT Treatments $Therapeutic Activity: 8-22 mins PT General Charges $$ ACUTE PT VISIT: 1 Visit         Carmin JONELLE Deed, DPT 08/28/2024, 4:36 PM

## 2024-08-28 NOTE — Consult Note (Signed)
 "  Interval history:  No notable events overnight.  Maintaining sinus rhythm.  Past Medical History: Past Medical History:  Diagnosis Date   Compression fracture of first cervical vertebra (HCC)    Essential tremor    History of hip replacement    Ileus [facility])     Past Surgical History: Past Surgical History:  Procedure Laterality Date   25 GAUGE PARS PLANA VITRECTOMY WITH 20 GAUGE MVR PORT Right 03/18/2017   25 GAUGE PARS PLANA VITRECTOMY WITH 20 GAUGE MVR PORT FOR MACULAR HOLE Right 03/18/2017   Procedure: 25 GAUGE PARS PLANA VITRECTOMY WITH 20 GAUGE MVR PORT FOR MACULAR HOLE; PHOTOCOAGULATION RIGHT EYE;  Surgeon: Alvia Norleen BIRCH, MD;  Location: [facility] OR;  Service: Ophthalmology;  Laterality: Right;   APPENDECTOMY     COLONOSCOPY W/ POLYPECTOMY     GAS INSERTION Right 03/18/2017   Procedure: INSERTION OF GAS RIGHT (C3F8);  Surgeon: Alvia Norleen BIRCH, MD;  Location: [facility] OR;  Service: Ophthalmology;  Laterality: Right;   GAS/FLUID EXCHANGE Right 03/18/2017   Procedure: GAS/FLUID EXCHANGE RIGHT EYE;  Surgeon: Alvia Norleen BIRCH, MD;  Location: [facility] OR;  Service: Ophthalmology;  Laterality: Right;   JOINT REPLACEMENT Right    Hip   LEFT HEART CATH AND CORONARY ANGIOGRAPHY N/A 02/06/2022   Procedure: LEFT HEART CATH AND CORONARY ANGIOGRAPHY;  Surgeon: Darron Deatrice LABOR, MD;  Location: [facility] INVASIVE CV LAB;  Service: Cardiovascular;  Laterality: N/A;   LOWER EXTREMITY ANGIOGRAPHY Left 08/25/2024   Procedure: Lower Extremity Angiography;  Surgeon: Marea Selinda RAMAN, MD;  Location: [facility] INVASIVE CV LAB;  Service: Cardiovascular;  Laterality: Left;   LOWER EXTREMITY INTERVENTION Left 08/26/2024   Procedure: LOWER EXTREMITY INTERVENTION;  Surgeon: Marea Selinda RAMAN, MD;  Location: [facility] INVASIVE CV LAB;  Service: Cardiovascular;  Laterality: Left;   LOWER EXTREMITY INTERVENTION Left 08/27/2024   Procedure: LOWER EXTREMITY INTERVENTION;  Surgeon: Marea Selinda RAMAN, MD;  Location: [facility] INVASIVE CV LAB;  Service:  Cardiovascular;  Laterality: Left;   MEMBRANE PEEL Right 03/18/2017   Procedure: MEMBRANE PEEL RIGHT EYE;  Surgeon: Alvia Norleen BIRCH, MD;  Location: [facility] OR;  Service: Ophthalmology;  Laterality: Right;   MOUTH SURGERY     PHOTOCOAGULATION Left 03/18/2017   Procedure: PHOTOCOAGULATION LEFT EYE;  Surgeon: Alvia Norleen BIRCH, MD;  Location: [facility] OR;  Service: Ophthalmology;  Laterality: Left;   SERUM PATCH Right 03/18/2017   Procedure: SERUM PATCH RIGHT EYE;  Surgeon: Alvia Norleen BIRCH, MD;  Location: [facility] OR;  Service: Ophthalmology;  Laterality: Right;     Allergies:    Allergies[1]  Social History:   Social History   Socioeconomic History   Marital status: Married    Spouse name: Surveyor, Minerals   Number of children: Not on file   Years of education: Not on file   Highest education level: Not on file  Occupational History   Not on file  Tobacco Use   Smoking status: Former    Current packs/day: 0.00    Types: Cigarettes    Start date: 10/1965    Quit date: 10/2015    Years since quitting: 8.8    Passive exposure: Past   Smokeless tobacco: Never  Vaping Use   Vaping status: Never Used  Substance and Sexual Activity   Alcohol use: Yes    Alcohol/week: 7.0 standard drinks of alcohol    Types: 7 Shots of liquor per week   Drug use: No   Sexual activity: Not Currently  Other Topics Concern   Not on  file  Social History Narrative   Not on file   Social Drivers of Health   Tobacco Use: Medium Risk (08/25/2024)   Patient History    Smoking Tobacco Use: Former    Smokeless Tobacco Use: Never    Passive Exposure: Past  Physicist, Medical Strain: Low Risk  (08/10/2024)   Received from [facility]   Overall Financial Resource Strain (CARDIA)    Difficulty of Paying Living Expenses: Not hard at all  Food Insecurity: No Food Insecurity (08/25/2024)   Epic    Worried About Radiation Protection Practitioner of Food in the Last Year: Never true    Ran Out of Food in the Last Year: Never true   Transportation Needs: No Transportation Needs (08/25/2024)   Epic    Lack of Transportation (Medical): No    Lack of Transportation (Non-Medical): No  Physical Activity: Not on file  Stress: Not on file  Social Connections: Moderately Isolated (08/25/2024)   Social Connection and Isolation Panel    Frequency of Communication with Friends and Family: Once a week    Frequency of Social Gatherings with Friends and Family: Once a week    Attends Religious Services: More than 4 times per year    Active Member of [employer] or Organizations: No    Attends Banker Meetings: Never    Marital Status: Married  Catering Manager Violence: Not At Risk (08/25/2024)   Epic    Fear of Current or Ex-Partner: No    Emotionally Abused: No    Physically Abused: No    Sexually Abused: No  Depression (PHQ2-9): Not on file  Alcohol Screen: Not on file  Housing: Low Risk (08/25/2024)   Epic    Unable to Pay for Housing in the Last Year: No    Number of Times Moved in the Last Year: 0    Homeless in the Last Year: No  Utilities: Not At Risk (08/25/2024)   Epic    Threatened with loss of utilities: No  Health Literacy: Not on file     Family History:    Family History  Problem Relation Age of Onset   Stroke Brother      ROS:  All other ROS reviewed and negative. Pertinent positives noted in the HPI.     Physical Exam/Data:   Vitals:   08/28/24 0500 08/28/24 0600 08/28/24 0800 08/28/24 0900  BP: 125/66 127/65 111/71 130/69  Pulse: 71 70 76 79  Resp: 20 20 17  (!) 28  Temp:      TempSrc:      SpO2: 94% 93% 95% 91%  Weight: 82.1 kg     Height:        Intake/Output Summary (Last 24 hours) at 08/28/2024 0944 Last data filed at 08/28/2024 0626 Gross per 24 hour  Intake 2944.55 ml  Output 1935 ml  Net 1009.55 ml       08/28/2024    5:00 AM 08/27/2024    4:27 AM 08/24/2024    1:26 PM  Last 3 Weights  Weight (lbs) 181 lb 173 lb 11.6 oz 170 lb  Weight (kg) 82.1 kg 78.8 kg 77.111 kg     Body mass index is 24.55 kg/m.  General: no acute distress  Neck: no JVD Cardiac: Normal S1, S2; RRR; no murmurs, rubs, or gallops Lungs: Clear to auscultation bilaterally, no wheezing, rhonchi or rales  Ext: No edema, pulses 2+ Skin: Warm and dry Neuro: Alert and oriented to person, place, time, and situation  Psych: Normal mood and affect   EKG:  The EKG was personally reviewed and demonstrates: Rapid atrial fibrillation Telemetry:  Telemetry was personally reviewed and demonstrates: Sinus rhythm  Relevant CV Studies: -TTE 08/28/2024 normal biventricular function, moderate calcific mitral stenosis.  Calcified aortic valve without clear evidence of aortic stenosis. - TTE 01/2022 LVEF 70-75%, normal RV size and function, no significant valvular disease - Cath 01/2022 CTO RCA, 40% mid LCx, 30% ostial LAD  Assessment and Plan:  Rapid atrial fibrillation Paroxysmal atrial fibrillation Patient presents with new diagnosis of rapid atrial fibrillation that occurred during a revascularization attempt for critical limb ischemia.  He has since converted back to sinus rhythm.  Suspect this was probably provoked by his acute illness.  CHA2DS2-VASc of at least 4.  Ejection fraction preserved.  Plan: - Continue p.o. amiodarone  load - Continue anticoagulation with heparin ; transition to p.o. once appropriate - We will plan to see him in clinic and decide on utility of ongoing amiodarone  therapy - Thyroid function and LFTs  CAD HLD Critical limb ischemia Known CTO of RCA from cath 01/2022.  Last LDL 39 06/2024.  At goal.  No angina.  Plan: - Continue ASA 81 mg daily - Lipitor 20 mg daily  Moderate calcific mitral stenosis Heavily calcified mitral annulus with mean gradient of 9.  No further inpatient evaluation/treatment needed.  Will follow-up outpatient.   Since patient is in sinus rhythm, cardiology will sign off.  We will plan to see him in clinic for ongoing care after he is  discharged.  For questions or updates, please contact [facility] Please consult [email or web address] for contact info under    Signed, Caron Poser, MD  [facility]  [facility]  08/28/2024 9:44 AM     [1] No Known Allergies  "

## 2024-08-28 NOTE — Progress Notes (Signed)
" °  Echocardiogram 2D Echocardiogram has been performed.  Nicolas Solis Louder 08/28/2024, 9:05 AM "

## 2024-08-28 NOTE — Plan of Care (Signed)

## 2024-08-28 NOTE — Consult Note (Signed)
 PHARMACY - ANTICOAGULATION CONSULT NOTE  Pharmacy Consult for heparin  dosing Indication: critical limb ischemia  Allergies[1]  Patient Measurements: Height: 6' (182.9 cm) Weight: 82.1 kg (181 lb) IBW/kg (Calculated) : 77.6 HEPARIN  DW (KG): 77.1  Vital Signs: Temp: 98 F (36.7 C) (01/31 0400) Temp Source: Oral (01/31 0400) BP: 127/65 (01/31 0600) Pulse Rate: 70 (01/31 0600)  Labs: Recent Labs    08/26/24 0215 08/26/24 0532 08/26/24 0654 08/27/24 0423 08/27/24 1206 08/27/24 1709 08/27/24 2245 08/28/24 0515  HGB  --  9.0*   < > 8.2*   < > 8.8* 8.0* 8.1*  HCT  --  26.9*   < > 25.0*   < > 25.7* 24.0* 24.5*  PLT  --  222   < > 192   < > 203 203 211  HEPARINUNFRC <0.10*  --   --   --   --   --   --   --   CREATININE  --  0.42*  --  0.52*  --   --   --  0.42*   < > = values in this interval not displayed.    Estimated Creatinine Clearance: 88.9 mL/min (A) (by C-G formula based on SCr of 0.42 mg/dL (L)).   Medical History: Past Medical History:  Diagnosis Date   Compression fracture of first cervical vertebra (HCC)    Essential tremor    History of hip replacement    Ileus (HCC)     Medications:  No PTA anticoagulation  Assessment: Nicolas Solis is a 38 yoM presenting with concerns for critical limb ischemia of left foot. Past medical history is notable for coronary artery disease, Parkinson's disease, dementia, and abdominal aortic aneurysm. Patient had been complaining of worsening left foot pain, with significant pain at rest that worsens with movement. Physical exam noted for erythema of left lower extremity. ABI of left leg 0.54. Imaging of left foot from 1/27 with no evidence of osteomyelitis. Soft tissue swelling..mild soft tissue edema.  s/p LEFT SFA/Pop Stenting;TP trunk stenting 08/27/24 now resuming IV heparin   Baseline Labs hgb 10.6, plt 283, INR 1.1, and aPTT 36.   Goal of Therapy:  Heparin  level 0.3-0.7 units/ml Monitor platelets by  anticoagulation protocol: Yes   Plan:  --will give heparin  bolus of 4000 units IV x 1 --will start IV heparin  infusion at 1800 units/hour (rate based on data from this admission) --check heparin  level in 8 hrs after initiation --Continue to monitor H&H and platelets  Thank you for involving pharmacy in this patient's care.   Adriana Bolster, PharmD, BCPS Clinical Pharmacist 08/28/2024 8:39 AM     [1] No Known Allergies

## 2024-08-28 NOTE — Progress Notes (Signed)
 " Progress Note   Patient: Nicolas Solis FMW:[id number] DOB: [birth year] DOA: 08/24/2024     4 DOS: the patient was seen and examined on 08/28/2024    Brief hospital course: From HPI Nicolas Solis is a 74 y.o. male with medical history significant of coronary artery disease, Parkinson's disease presenting to the emergency department for evaluation of worsening pain, swelling, and discoloration of his left foot.  History is per his wife, present at bedside.  He has had numbness and tingling of his left leg from the knee throughout his foot since December.  At some point he developed an ulceration at the base of his fifth toe on the left that has been increasing in size for the past few weeks. No known injury or drainage from the wound. He started having chills 2 nights ago. Denies other systemic symptoms.     He's taken a course of antibiotics and was prescribed gabapentin  without improvement.    He saw Podiatry about two weeks ago and was referred to Vascular Surgery but has not yet seen them. He was in for follow up with PCP today and was referred to the ED given concern for acute limb ischemia    In the ED he met SIRS criteria with WBC and tachycardia. BP soft initially but fluid responsive.  XR left foot without evidence of OM. Bedside ABI  0.54.  Vascular surgery was consulted.  He was started on heparin  drip and antibiotics with plan for angiography tomorrow. Hospitalist consulted for admission      Assessment and Plan:    Atherosclerosis obliterans with ulceration  Continue heparin  Vascular surgery took patient for angiography on 08/27/2024 Podiatry following   SIRS Left foot ulceration with surrounding cellulitis  - leukocytosis and tachycardia, otherwise hemodynamically stable Continue current antibiotics Vascular surgeon and podiatry on board   New onset A-fib with RVR Patient on heparin  drip Amiodarone  drip have been switched to oral since patient is seen sinus  rhythm Cardiology is on board and case discussed   Acute on chronic macrocytic anemia  - Hgb slowly owndrending from 12.5 in December 2025  - f/u folate, B12 - monitor/transfuse as indicated    Hyponatremia, mild  Sodium level improving   Parkinson's disease  Dementia with behavioral disturbance Continue delirium precaution Currently on Precedex  drip   CAD HLD Continue home statin therapy      Advance Care Planning:   Code Status: Full Code  Discussed with patient at time of admission.    Consults: Vascular Surgery    Family Communication: Wife at bedside      Subjective:  Patient much better today Denies chest pain nausea vomiting   Physical Exam:   General: He is not in acute distress.    Appearance: He is not toxic-appearing.  Eyes:     Extraocular Movements: Extraocular movements intact.     Pupils: Pupils are equal, round, and reactive to light.  Cardiovascular:     Rate and Rhythm: Normal rate and regular rhythm.  Pulmonary:     Effort: Pulmonary effort is normal.     Breath sounds: Normal breath sounds.  Abdominal:     General: There is no distension.     Palpations: Abdomen is soft.     Tenderness: There is no abdominal tenderness.  Musculoskeletal:    Dressing noted to the left leg appears clean and dry Neurological: Tremors noted to the left upper extremity   Data Reviewed:    Latest Ref Rng &  Units 08/28/2024    5:15 AM 08/27/2024   10:45 PM 08/27/2024    5:09 PM  CBC  WBC 4.0 - 10.5 K/uL 15.0  14.3  13.0   Hemoglobin 13.0 - 17.0 g/dL 8.1  8.0  8.8   Hematocrit 39.0 - 52.0 % 24.5  24.0  25.7   Platelets 150 - 400 K/uL 211  203  203        Latest Ref Rng & Units 08/28/2024    5:15 AM 08/27/2024    4:23 AM 08/26/2024    5:32 AM  BMP  Glucose 70 - 99 mg/dL 856  880  885   BUN 8 - 23 mg/dL 11  14  7    Creatinine 0.61 - 1.24 mg/dL 9.57  9.47  9.57   Sodium 135 - 145 mmol/L 129  124  128   Potassium 3.5 - 5.1 mmol/L 3.9  3.5  3.9   Chloride 98  - 111 mmol/L 96  91  93   CO2 22 - 32 mmol/L 24  23  23    Calcium  8.9 - 10.3 mg/dL 7.7  7.7  8.1      Vitals:   08/28/24 0900 08/28/24 1100 08/28/24 1200 08/28/24 1338  BP: 130/69 (!) 144/71 (!) 154/74 128/70  Pulse: 79 88 81 84  Resp: (!) 28 (!) 30 (!) 29 18  Temp:    97.8 F (36.6 C)  TempSrc:      SpO2: 91% 93% 94% 93%  Weight:      Height:         Author: Drue ONEIDA Potter, MD 08/28/2024 6:29 PM  For on call review [email or web address].  "

## 2024-08-29 DIAGNOSIS — L97529 Non-pressure chronic ulcer of other part of left foot with unspecified severity: Secondary | ICD-10-CM | POA: Diagnosis not present

## 2024-08-29 LAB — THYROID PANEL WITH TSH
Free Thyroxine Index: 2.2 (ref 1.2–4.9)
T3 Uptake Ratio: 35 % (ref 24–39)
T4, Total: 6.4 ug/dL (ref 4.5–12.0)
TSH: 1.21 u[IU]/mL (ref 0.450–4.500)

## 2024-08-29 LAB — BASIC METABOLIC PANEL WITH GFR
Anion gap: 9 (ref 5–15)
BUN: 9 mg/dL (ref 8–23)
CO2: 26 mmol/L (ref 22–32)
Calcium: 7.8 mg/dL — ABNORMAL LOW (ref 8.9–10.3)
Chloride: 92 mmol/L — ABNORMAL LOW (ref 98–111)
Creatinine, Ser: 0.44 mg/dL — ABNORMAL LOW (ref 0.61–1.24)
GFR, Estimated: >60 mL/min
Glucose, Bld: 138 mg/dL — ABNORMAL HIGH (ref 70–99)
Potassium: 3.9 mmol/L (ref 3.5–5.1)
Sodium: 126 mmol/L — ABNORMAL LOW (ref 135–145)

## 2024-08-29 LAB — CBC
HCT: 27.4 % — ABNORMAL LOW (ref 39.0–52.0)
Hemoglobin: 8.9 g/dL — ABNORMAL LOW (ref 13.0–17.0)
MCH: 32.5 pg (ref 26.0–34.0)
MCHC: 32.5 g/dL (ref 30.0–36.0)
MCV: 100 fL (ref 80.0–100.0)
Platelets: 306 10*3/uL (ref 150–400)
RBC: 2.74 MIL/uL — ABNORMAL LOW (ref 4.22–5.81)
RDW: 13.4 % (ref 11.5–15.5)
WBC: 13 10*3/uL — ABNORMAL HIGH (ref 4.0–10.5)
nRBC: 0 % (ref 0.0–0.2)

## 2024-08-29 LAB — CULTURE, BLOOD (SINGLE)
Culture: NO GROWTH
Special Requests: ADEQUATE

## 2024-08-29 LAB — HEPARIN LEVEL (UNFRACTIONATED): Heparin Unfractionated: 0.4 [IU]/mL (ref 0.30–0.70)

## 2024-08-29 LAB — GLUCOSE, CAPILLARY: Glucose-Capillary: 132 mg/dL — ABNORMAL HIGH (ref 70–99)

## 2024-08-29 NOTE — Progress Notes (Signed)
 PODIATRY / FOOT AND ANKLE SURGERY PROGRESS NOTE  Requesting Physician: Dr. Collie  Reason for consult: L foot gangrene   HPI: Nicolas Solis is a 74 y.o. male who presents today sitting up in bed.  He still complains of some left foot pain.  He is getting up and moving around more and wearing a surgical shoe to do this.  He had his dressing changed last night and they have not noticed any worsening with the necrosis present since Friday.  PMHx:  Past Medical History:  Diagnosis Date   Compression fracture of first cervical vertebra (HCC)    Essential tremor    History of hip replacement    Ileus (HCC)     Surgical Hx:  Past Surgical History:  Procedure Laterality Date   25 GAUGE PARS PLANA VITRECTOMY WITH 20 GAUGE MVR PORT Right 03/18/2017   25 GAUGE PARS PLANA VITRECTOMY WITH 20 GAUGE MVR PORT FOR MACULAR HOLE Right 03/18/2017   Procedure: 25 GAUGE PARS PLANA VITRECTOMY WITH 20 GAUGE MVR PORT FOR MACULAR HOLE; PHOTOCOAGULATION RIGHT EYE;  Surgeon: Alvia Norleen BIRCH, MD;  Location: [facility] OR;  Service: Ophthalmology;  Laterality: Right;   APPENDECTOMY     COLONOSCOPY W/ POLYPECTOMY     GAS INSERTION Right 03/18/2017   Procedure: INSERTION OF GAS RIGHT (C3F8);  Surgeon: Alvia Norleen BIRCH, MD;  Location: [facility] OR;  Service: Ophthalmology;  Laterality: Right;   GAS/FLUID EXCHANGE Right 03/18/2017   Procedure: GAS/FLUID EXCHANGE RIGHT EYE;  Surgeon: Alvia Norleen BIRCH, MD;  Location: [facility] OR;  Service: Ophthalmology;  Laterality: Right;   JOINT REPLACEMENT Right    Hip   LEFT HEART CATH AND CORONARY ANGIOGRAPHY N/A 02/06/2022   Procedure: LEFT HEART CATH AND CORONARY ANGIOGRAPHY;  Surgeon: Darron Deatrice LABOR, MD;  Location: [facility] INVASIVE CV LAB;  Service: Cardiovascular;  Laterality: N/A;   LOWER EXTREMITY ANGIOGRAPHY Left 08/25/2024   Procedure: Lower Extremity Angiography;  Surgeon: Marea Selinda RAMAN, MD;  Location: [facility] INVASIVE CV LAB;  Service: Cardiovascular;  Laterality: Left;   LOWER EXTREMITY  INTERVENTION Left 08/26/2024   Procedure: LOWER EXTREMITY INTERVENTION;  Surgeon: Marea Selinda RAMAN, MD;  Location: [facility] INVASIVE CV LAB;  Service: Cardiovascular;  Laterality: Left;   LOWER EXTREMITY INTERVENTION Left 08/27/2024   Procedure: LOWER EXTREMITY INTERVENTION;  Surgeon: Marea Selinda RAMAN, MD;  Location: [facility] INVASIVE CV LAB;  Service: Cardiovascular;  Laterality: Left;   MEMBRANE PEEL Right 03/18/2017   Procedure: MEMBRANE PEEL RIGHT EYE;  Surgeon: Alvia Norleen BIRCH, MD;  Location: [facility] OR;  Service: Ophthalmology;  Laterality: Right;   MOUTH SURGERY     PHOTOCOAGULATION Left 03/18/2017   Procedure: PHOTOCOAGULATION LEFT EYE;  Surgeon: Alvia Norleen BIRCH, MD;  Location: [facility] OR;  Service: Ophthalmology;  Laterality: Left;   SERUM PATCH Right 03/18/2017   Procedure: SERUM PATCH RIGHT EYE;  Surgeon: Alvia Norleen BIRCH, MD;  Location: [facility] OR;  Service: Ophthalmology;  Laterality: Right;    FHx:  Family History  Problem Relation Age of Onset   Stroke Brother     Social History:  reports that he quit smoking about 8 years ago. His smoking use included cigarettes. He started smoking about 58 years ago. He has been exposed to tobacco smoke. He has never used smokeless tobacco. He reports current alcohol use of about 7.0 standard drinks of alcohol per week. He reports that he does not use drugs.  Allergies: Allergies[1]  Medications Prior to Admission  Medication Sig Dispense Refill   acetaminophen  (TYLENOL ) 500  MG tablet Take 1,000 mg by mouth every 6 (six) hours as needed (for pain.).     aspirin  EC 81 MG tablet Take 1 tablet (81 mg total) by mouth daily. Swallow whole. 90 tablet 3   atorvastatin  (LIPITOR) 20 MG tablet Take 1 tablet (20 mg total) by mouth daily. 90 tablet 3   carbidopa -levodopa  (SINEMET  IR) 25-100 MG tablet Take 2 tablets by mouth 3 (three) times daily before meals.     Carbidopa -Levodopa  ER (SINEMET  CR) 25-100 MG tablet controlled release Take 1 tablet by mouth at bedtime.      Cholecalciferol  (VITAMIN D3) 50 MCG (2000 UT) TABS Take 2,000 Units by mouth in the morning.     Cyanocobalamin  (VITAMIN B-12) 5000 MCG LOZG Take 2,500 mcg by mouth every evening.     gabapentin  (NEURONTIN ) 100 MG capsule Take 100 mg by mouth 2 (two) times daily.     midodrine  (PROAMATINE ) 2.5 MG tablet Take 1 tablet (2.5 mg total) by mouth 3 (three) times daily with meals. 270 tablet 2   Multiple Vitamins-Minerals (ADULT ONE DAILY GUMMIES PO) Take 2 tablets by mouth every evening.     Omega-3 Fatty Acids (FISH OIL PO) Take 1,000 mg by mouth in the morning.     ranolazine  (RANEXA ) 500 MG 12 hr tablet TAKE ONE TABLET BY MOUTH TWICE DAILY 180 tablet 3   tamsulosin  (FLOMAX ) 0.4 MG CAPS capsule Take 0.4 mg by mouth daily after supper.     doxycycline  (VIBRAMYCIN ) 100 MG capsule Take 100 mg by mouth 2 (two) times daily.      Physical Exam: General: Alert and oriented.  No apparent distress.  Vascular: DP/PT pulses not palpable to the left side, difficult to palpate also on the right side, capillary fill time delayed to left forefoot, no hair growth present to bilateral lower extremities.  Neuro: Light touch sensation intact bilateral lower extremities.  Derm: Dry gangrene present on the left fifth ray and fourth ray, spotty areas about the dorsal and plantar forefoot/midfoot appear to be slightly improved, appears to be blistering in this area, removed some of the blistered type tissue to observe the tissues and did appear to be slightly more healthy compared to last evaluation on Friday.  Still has mild to moderate edema and erythema present to the left forefoot in general but slightly improved.  MSK: Pain on palpation left foot  Results for orders placed or performed during the hospital encounter of 08/24/24 (from the past 48 hours)  CBC     Status: Abnormal   Collection Time: 08/27/24  5:09 PM  Result Value Ref Range   WBC 13.0 (H) 4.0 - 10.5 K/uL   RBC 2.60 (L) 4.22 - 5.81 MIL/uL    Hemoglobin 8.8 (L) 13.0 - 17.0 g/dL   HCT 74.2 (L) 60.9 - 47.9 %   MCV 98.8 80.0 - 100.0 fL   MCH 33.8 26.0 - 34.0 pg   MCHC 34.2 30.0 - 36.0 g/dL   RDW 86.7 88.4 - 84.4 %   Platelets 203 150 - 400 K/uL   nRBC 0.0 0.0 - 0.2 %    Comment: Performed at [facility], [address] [place], [place] [address]  Thyroid Panel With TSH     Status: None   Collection Time: 08/27/24  5:09 PM  Result Value Ref Range   TSH 1.210 0.450 - 4.500 uIU/mL   T4, Total 6.4 4.5 - 12.0 ug/dL   T3 Uptake Ratio 35 24 - 39 %  Free Thyroxine Index 2.2 1.2 - 4.9    Comment: (NOTE) Performed At: [facility] [address] [place], [place] [id number] Jennette Shorter MD Ey:[id number]   Hepatic function panel     Status: Abnormal   Collection Time: 08/27/24  5:09 PM  Result Value Ref Range   Total Protein 6.9 6.5 - 8.1 g/dL   Albumin 3.0 (L) 3.5 - 5.0 g/dL   AST 84 (H) 15 - 41 U/L   ALT 12 0 - 44 U/L   Alkaline Phosphatase 201 (H) 38 - 126 U/L   Total Bilirubin 0.4 0.0 - 1.2 mg/dL   Bilirubin, Direct 0.3 (H) 0.0 - 0.2 mg/dL   Indirect Bilirubin 0.1 (L) 0.3 - 0.9 mg/dL    Comment: Performed at [facility], [address] [place], [place] [address]  Glucose, capillary     Status: Abnormal   Collection Time: 08/27/24  5:32 PM  Result Value Ref Range   Glucose-Capillary 203 (H) 70 - 99 mg/dL    Comment: Glucose reference range applies only to samples taken after fasting for at least 8 hours.  Sodium, urine, random     Status: None   Collection Time: 08/27/24  7:50 PM  Result Value Ref Range   Sodium, Ur 31 mmol/L    Comment: NO NORMAL RANGE ESTABLISHED FOR THIS TEST Performed at [facility], [address] [place], [place] [address]   Chloride, urine, random     Status: None   Collection Time: 08/27/24  7:50 PM  Result Value Ref Range   Chloride Urine 56 mmol/L    Comment: NO NORMAL RANGE ESTABLISHED FOR THIS TEST Performed at [facility], [address], [place], [place] [address]   Osmolality, urine     Status: None   Collection Time: 08/27/24  7:50 PM  Result Value Ref Range   Osmolality, Ur 651 300 - 900 mOsm/kg    Comment: REPEATED TO VERIFY Performed at [facility], [address] [place], [place] [address]   Osmolality     Status: Abnormal   Collection Time: 08/27/24  7:50 PM  Result Value Ref Range   Osmolality 272 (L) 275 - 295 mOsm/kg    Comment: REPEATED TO VERIFY Performed at [facility], [address] [place], [place] [address]   CBC     Status: Abnormal   Collection Time: 08/27/24 10:45 PM  Result Value Ref Range   WBC 14.3 (H) 4.0 - 10.5 K/uL   RBC 2.41 (L) 4.22 - 5.81 MIL/uL   Hemoglobin 8.0 (L) 13.0 - 17.0 g/dL   HCT 75.9 (L) 60.9 - 47.9 %   MCV 99.6 80.0 - 100.0 fL   MCH 33.2 26.0 - 34.0 pg   MCHC 33.3 30.0 - 36.0 g/dL   RDW 86.8 88.4 - 84.4 %   Platelets 203 150 - 400 K/uL   nRBC 0.0 0.0 - 0.2 %    Comment: Performed at [facility], [address] [place], [place] [address]  Glucose, capillary     Status: Abnormal   Collection Time: 08/27/24 11:33 PM  Result Value Ref Range   Glucose-Capillary 187 (H) 70 - 99 mg/dL    Comment: Glucose reference range applies only to samples taken after fasting for at least 8 hours.  Glucose, capillary     Status: Abnormal   Collection Time: 08/28/24  3:38 AM  Result Value Ref Range   Glucose-Capillary 167 (H) 70 - 99  mg/dL    Comment: Glucose reference range applies only to samples taken after fasting for at least 8 hours.  CBC with Differential/Platelet     Status: Abnormal   Collection Time: 08/28/24  5:15 AM  Result Value Ref Range   WBC 15.0 (H) 4.0 - 10.5 K/uL   RBC 2.44 (L) 4.22 - 5.81 MIL/uL   Hemoglobin 8.1 (L) 13.0 - 17.0 g/dL   HCT 75.4 (L) 60.9 - 47.9 %   MCV 100.4 (H) 80.0 - 100.0 fL   MCH 33.2 26.0 - 34.0 pg   MCHC 33.1 30.0 - 36.0 g/dL   RDW 86.7 88.4 - 84.4 %   Platelets 211 150 - 400 K/uL   nRBC 0.0 0.0 - 0.2  %   Neutrophils Relative % 87 %   Neutro Abs 13.1 (H) 1.7 - 7.7 K/uL   Lymphocytes Relative 5 %   Lymphs Abs 0.8 0.7 - 4.0 K/uL   Monocytes Relative 6 %   Monocytes Absolute 0.8 0.1 - 1.0 K/uL   Eosinophils Relative 0 %   Eosinophils Absolute 0.0 0.0 - 0.5 K/uL   Basophils Relative 0 %   Basophils Absolute 0.0 0.0 - 0.1 K/uL   Immature Granulocytes 2 %   Abs Immature Granulocytes 0.26 (H) 0.00 - 0.07 K/uL    Comment: Performed at [facility], [address] [place], [place] [address]  Basic metabolic panel with GFR     Status: Abnormal   Collection Time: 08/28/24  5:15 AM  Result Value Ref Range   Sodium 129 (L) 135 - 145 mmol/L   Potassium 3.9 3.5 - 5.1 mmol/L   Chloride 96 (L) 98 - 111 mmol/L   CO2 24 22 - 32 mmol/L   Glucose, Bld 143 (H) 70 - 99 mg/dL    Comment: Glucose reference range applies only to samples taken after fasting for at least 8 hours.   BUN 11 8 - 23 mg/dL   Creatinine, Ser 9.57 (L) 0.61 - 1.24 mg/dL   Calcium  7.7 (L) 8.9 - 10.3 mg/dL   GFR, Estimated >39 >39 mL/min    Comment: (NOTE) Calculated using the CKD-EPI Creatinine Equation (2021)    Anion gap 9 5 - 15    Comment: Performed at [facility], [address] [place], [place] [address]  Glucose, capillary     Status: Abnormal   Collection Time: 08/28/24  1:36 PM  Result Value Ref Range   Glucose-Capillary 150 (H) 70 - 99 mg/dL    Comment: Glucose reference range applies only to samples taken after fasting for at least 8 hours.  Heparin  level (unfractionated)     Status: None   Collection Time: 08/28/24  7:20 PM  Result Value Ref Range   Heparin  Unfractionated 0.38 0.30 - 0.70 IU/mL    Comment: (NOTE) The clinical reportable range upper limit is being lowered to >1.10 to align with the FDA approved guidance for the current laboratory assay.  If heparin  results are below expected values, and patient dosage has  been confirmed, suggest follow up testing of antithrombin III  levels. Performed at [facility], [address], [place], [place] [address]   Basic metabolic panel with GFR     Status: Abnormal   Collection Time: 08/29/24  8:14 AM  Result Value Ref Range   Sodium 126 (L) 135 - 145 mmol/L   Potassium 3.9 3.5 - 5.1 mmol/L   Chloride 92 (L) 98 - 111 mmol/L   CO2 26  22 - 32 mmol/L   Glucose, Bld 138 (H) 70 - 99 mg/dL    Comment: Glucose reference range applies only to samples taken after fasting for at least 8 hours.   BUN 9 8 - 23 mg/dL   Creatinine, Ser 9.55 (L) 0.61 - 1.24 mg/dL   Calcium  7.8 (L) 8.9 - 10.3 mg/dL   GFR, Estimated >39 >39 mL/min    Comment: (NOTE) Calculated using the CKD-EPI Creatinine Equation (2021)    Anion gap 9 5 - 15    Comment: Performed at [facility], [address] [place], [place] [address]  Heparin  level (unfractionated)     Status: None   Collection Time: 08/29/24  8:14 AM  Result Value Ref Range   Heparin  Unfractionated 0.40 0.30 - 0.70 IU/mL    Comment: (NOTE) The clinical reportable range upper limit is being lowered to >1.10 to align with the FDA approved guidance for the current laboratory assay.  If heparin  results are below expected values, and patient dosage has  been confirmed, suggest follow up testing of antithrombin III levels. Performed at [facility], [address] [place], [place] [address]   CBC     Status: Abnormal   Collection Time: 08/29/24  8:14 AM  Result Value Ref Range   WBC 13.0 (H) 4.0 - 10.5 K/uL   RBC 2.74 (L) 4.22 - 5.81 MIL/uL   Hemoglobin 8.9 (L) 13.0 - 17.0 g/dL   HCT 72.5 (L) 60.9 - 47.9 %   MCV 100.0 80.0 - 100.0 fL   MCH 32.5 26.0 - 34.0 pg   MCHC 32.5 30.0 - 36.0 g/dL   RDW 86.5 88.4 - 84.4 %   Platelets 306 150 - 400 K/uL   nRBC 0.0 0.0 - 0.2 %    Comment: Performed at [facility], [address] [place], [place] [address]  Glucose, capillary     Status: Abnormal   Collection Time: 08/29/24 12:17 PM  Result Value  Ref Range   Glucose-Capillary 132 (H) 70 - 99 mg/dL    Comment: Glucose reference range applies only to samples taken after fasting for at least 8 hours.   ECHOCARDIOGRAM COMPLETE Result Date: 08/28/2024    ECHOCARDIOGRAM REPORT   Patient Name:   Nicolas Solis [facility] Date of Exam: 08/28/2024 Medical Rec #:  [id number]          Height:       72.0 in Accession #:    [id number]         Weight:       181.0 lb Date of Birth:  11/10/1950           BSA:          2.042 m Patient Age:    73 years           BP:           127/65 mmHg Patient Gender: M                  HR:           72 bpm. Exam Location:  [facility] Procedure: 2D Echo, Cardiac Doppler, Color Doppler and Strain Analysis (Both            Spectral and Color Flow Doppler were utilized during procedure). Indications:     Atrial Fibrillation I48.91  History:         Patient has prior history of Echocardiogram examinations, most  recent 02/11/2022.  Sonographer:     Thedora Louder RDCS, FASE Referring Phys:  [id number] SELINDA RAMAN DEW Diagnosing Phys: Caron Poser  Sonographer Comments: Global longitudinal strain was attempted. IMPRESSIONS  1. Left ventricular ejection fraction, by estimation, is 65 to 70%. The left ventricle has normal function. Left ventricular endocardial border not optimally defined to evaluate regional wall motion. There is mild left ventricular hypertrophy. Left ventricular diastolic parameters are consistent with Grade II diastolic dysfunction (pseudonormalization).  2. Right ventricular systolic function is normal. The right ventricular size is normal. There is mildly elevated pulmonary artery systolic pressure. The estimated right ventricular systolic pressure is 44.0 mmHg.  3. Left atrial size was mildly dilated.  4. Right atrial size was mildly dilated.  5. The mitral valve is degenerative. Trivial mitral valve regurgitation. Moderate calcific mitral stenosis. The mean mitral valve gradient is 9.0 mmHg. Moderate to severe mitral  annular calcification.  6. The aortic valve has an indeterminant number of cusps. There is moderate calcification of the aortic valve. Aortic valve regurgitation is not visualized. Aortic valve sclerosis/calcification is present, without any evidence of aortic stenosis. Aortic  valve area, by VTI measures 1.96 cm. Aortic valve mean gradient measures 8.0 mmHg. Aortic valve Vmax measures 1.98 m/s. DVI >0.5 with SVI 36.  7. The inferior vena cava is dilated in size with >50% respiratory variability, suggesting right atrial pressure of 8 mmHg. Comparison(s): A prior study was performed on 02/11/2022. No significant change from prior study. FINDINGS  Left Ventricle: Left ventricular ejection fraction, by estimation, is 65 to 70%. The left ventricle has normal function. Left ventricular endocardial border not optimally defined to evaluate regional wall motion. Global longitudinal strain performed but  not reported based on interpreter judgement due to suboptimal tracking. The left ventricular internal cavity size was normal in size. There is mild left ventricular hypertrophy. Left ventricular diastolic parameters are consistent with Grade II diastolic dysfunction (pseudonormalization). Right Ventricle: The right ventricular size is normal. Right vetricular wall thickness was not well visualized. Right ventricular systolic function is normal. There is mildly elevated pulmonary artery systolic pressure. The tricuspid regurgitant velocity  is 3.00 m/s, and with an assumed right atrial pressure of 8 mmHg, the estimated right ventricular systolic pressure is 44.0 mmHg. Left Atrium: Left atrial size was mildly dilated. Right Atrium: Right atrial size was mildly dilated. Pericardium: There is no evidence of pericardial effusion. Mitral Valve: The mitral valve is degenerative in appearance. There is moderate thickening of the anterior and posterior mitral valve leaflet(s). There is mild calcification of the mitral valve  leaflet(s). Mildly decreased mobility of the mitral valve leaflets. Moderate to severe mitral annular calcification. Trivial mitral valve regurgitation. Moderate mitral valve stenosis. MV peak gradient, 20.6 mmHg. The mean mitral valve gradient is 9.0 mmHg. Tricuspid Valve: The tricuspid valve is not well visualized. Tricuspid valve regurgitation is trivial. No evidence of tricuspid stenosis. Aortic Valve: The aortic valve has an indeterminant number of cusps. There is moderate calcification of the aortic valve. Aortic valve regurgitation is not visualized. Aortic valve sclerosis/calcification is present, without any evidence of aortic stenosis. Aortic valve mean gradient measures 8.0 mmHg. Aortic valve peak gradient measures 15.7 mmHg. Aortic valve area, by VTI measures 1.96 cm. Pulmonic Valve: The pulmonic valve was not well visualized. Pulmonic valve regurgitation is not visualized. No evidence of pulmonic stenosis. Aorta: The aortic root and ascending aorta are structurally normal, with no evidence of dilitation. Venous: The inferior vena cava is dilated in size with  greater than 50% respiratory variability, suggesting right atrial pressure of 8 mmHg. IAS/Shunts: No atrial level shunt detected by color flow Doppler.  LEFT VENTRICLE PLAX 2D LVIDd:         4.10 cm   Diastology LVIDs:         2.80 cm   LV e' medial:    5.87 cm/s LV PW:         1.30 cm   LV E/e' medial:  33.2 LV IVS:        1.10 cm   LV e' lateral:   6.31 cm/s LVOT diam:     2.00 cm   LV E/e' lateral: 30.9 LV SV:         74 LV SV Index:   36        2D Longitudinal Strain LVOT Area:     3.14 cm  2D Strain GLS Avg:     -16.3 % LV IVRT:       53 msec  RIGHT VENTRICLE RV Basal diam:  3.60 cm     PULMONARY VEINS RV S prime:     18.70 cm/s  Diastolic Velocity: 36.10 cm/s TAPSE (M-mode): 2.3 cm      S/D Velocity:       1.70                             Systolic Velocity:  60.30 cm/s LEFT ATRIUM             Index        RIGHT ATRIUM           Index LA  diam:        4.40 cm 2.15 cm/m   RA Area:     22.00 cm LA Vol (A2C):   65.6 ml 32.12 ml/m  RA Volume:   66.00 ml  32.32 ml/m LA Vol (A4C):   66.9 ml 32.76 ml/m LA Biplane Vol: 73.7 ml 36.09 ml/m  AORTIC VALVE                     PULMONIC VALVE AV Area (Vmax):    1.82 cm      PV Vmax:        0.83 m/s AV Area (Vmean):   1.77 cm      PV Peak grad:   2.8 mmHg AV Area (VTI):     1.96 cm      RVOT Peak grad: 2 mmHg AV Vmax:           198.00 cm/s AV Vmean:          126.333 cm/s AV VTI:            0.376 m AV Peak Grad:      15.7 mmHg AV Mean Grad:      8.0 mmHg LVOT Vmax:         115.00 cm/s LVOT Vmean:        71.200 cm/s LVOT VTI:          0.235 m LVOT/AV VTI ratio: 0.62  AORTA Ao Root diam: 3.80 cm Ao Asc diam:  3.10 cm MITRAL VALVE                TRICUSPID VALVE MV Area (PHT): 2.25 cm     TR Peak grad:   36.0 mmHg MV Area VTI:   1.11 cm     TR Vmax:  300.00 cm/s MV Peak grad:  20.6 mmHg MV Mean grad:  9.0 mmHg     SHUNTS MV Vmax:       2.27 m/s     Systemic VTI:  0.24 m MV Vmean:      138.0 cm/s   Systemic Diam: 2.00 cm MV Decel Time: 337 msec MV E velocity: 195.00 cm/s MV A velocity: 134.00 cm/s MV E/A ratio:  1.46 Caron Poser Electronically signed by Caron Poser Signature Date/Time: 08/28/2024/9:42:45 AM    Final (Updated)     Blood pressure (!) 159/70, pulse 85, temperature 98 F (36.7 C), resp. rate 20, height 6' (1.829 m), weight 82.1 kg, SpO2 93%.  Assessment Critical limb ischemia left lower extremity -gangrene left lower extremity, dry, stable PVD Left foot gangrene  Plan - Patient seen and examined - Reviewed CT angiogram with recent interventions.  Appears to have two-vessel runoff to the foot with no flow through the anterior tibial/DP artery. - Patient's wound has stabilized with no further areas of necrosis.  Dorsal and plantar foot appears to be slightly improved compared to Friday.  Gangrenous necrosis still present around the lateral foot at the fifth metatarsal  phalange joint and fourth metatarsal phalange joint with associated digits. - Betadine wet-to-dry dressing applied today again.  Does not need to be changed until tomorrow. - Recommend further evaluation in the next 1 to 2 days to see if the skin continues to come around.  If this does then could consider transmetatarsal amputation at some point in near future. - Appreciate medicine recommendations for antibiotic and pain management - Appreciate vascular recommendations.  Prentice Lee, DPM 08/29/2024, 12:38 PM           [1] No Known Allergies

## 2024-08-29 NOTE — Consult Note (Signed)
 PHARMACY - ANTICOAGULATION CONSULT NOTE  Pharmacy Consult for heparin  dosing Indication: critical limb ischemia  Allergies[1]  Patient Measurements: Height: 6' (182.9 cm) Weight: 82.1 kg (181 lb) IBW/kg (Calculated) : 77.6 HEPARIN  DW (KG): 77.1  Vital Signs: Temp: 98 F (36.7 C) (02/01 0752) BP: 159/70 (02/01 0752) Pulse Rate: 85 (02/01 0752)  Labs: Recent Labs    08/27/24 0423 08/27/24 1206 08/27/24 1709 08/27/24 2245 08/28/24 0515 08/28/24 1920  HGB 8.2*   < > 8.8* 8.0* 8.1*  --   HCT 25.0*   < > 25.7* 24.0* 24.5*  --   PLT 192   < > 203 203 211  --   HEPARINUNFRC  --   --   --   --   --  0.38  CREATININE 0.52*  --   --   --  0.42*  --    < > = values in this interval not displayed.    Estimated Creatinine Clearance: 88.9 mL/min (A) (by C-G formula based on SCr of 0.42 mg/dL (L)).   Medical History: Past Medical History:  Diagnosis Date   Compression fracture of first cervical vertebra (HCC)    Essential tremor    History of hip replacement    Ileus (HCC)     Medications:  No PTA anticoagulation  Assessment: Nicolas Solis is a 71 yoM presenting with concerns for critical limb ischemia of left foot. Past medical history is notable for coronary artery disease, Parkinson's disease, dementia, and abdominal aortic aneurysm. Patient had been complaining of worsening left foot pain, with significant pain at rest that worsens with movement. Physical exam noted for erythema of left lower extremity. ABI of left leg 0.54. Imaging of left foot from 1/27 with no evidence of osteomyelitis. Soft tissue swelling..mild soft tissue edema.  s/p LEFT SFA/Pop Stenting;TP trunk stenting 08/27/24  resuming IV heparin   Baseline Labs hgb 10.6, plt 283, INR 1.1, and aPTT 36.   Goal of Therapy:  Heparin  level 0.3-0.7 units/ml Monitor platelets by anticoagulation protocol: Yes   Plan: heparin  level therapeutic x 2 --continue IV heparin  infusion at 1800 units/hour --Recheck  next heparin  level in am 02/02 --Continue to monitor H&H and platelets  Thank you for involving pharmacy in this patient's care.   Adriana Bolster, PharmD, BCPS Clinical Pharmacist 08/29/2024 8:02 AM      [1] No Known Allergies

## 2024-08-29 NOTE — Progress Notes (Signed)
 " Progress Note   Patient: Nicolas Solis FMW:[id number] DOB: 06-22-1951 DOA: 08/24/2024     5 DOS: the patient was seen and examined on 08/29/2024     Brief hospital course: From HPI Nicolas Solis is a 74 y.o. male with medical history significant of coronary artery disease, Parkinson's disease presenting to the emergency department for evaluation of worsening pain, swelling, and discoloration of his left foot.  History is per his wife, present at bedside.  He has had numbness and tingling of his left leg from the knee throughout his foot since December.  At some point he developed an ulceration at the base of his fifth toe on the left that has been increasing in size for the past few weeks. No known injury or drainage from the wound. He started having chills 2 nights ago. Denies other systemic symptoms.     He's taken a course of antibiotics and was prescribed gabapentin  without improvement.    He saw Podiatry about two weeks ago and was referred to Vascular Surgery but has not yet seen them. He was in for follow up with PCP today and was referred to the ED given concern for acute limb ischemia    In the ED he met SIRS criteria with WBC and tachycardia. BP soft initially but fluid responsive.  XR left foot without evidence of OM. Bedside ABI  0.54.  Vascular surgery was consulted.  He was started on heparin  drip and antibiotics with plan for angiography tomorrow. Hospitalist consulted for admission      Assessment and Plan:    Atherosclerosis obliterans with ulceration  Continue heparin  Vascular surgery took patient for angiography on 08/27/2024 Podiatry planning intervention tomorrow Continue PT OT  SIRS Left foot ulceration with surrounding cellulitis  - leukocytosis and tachycardia, otherwise hemodynamically stable Continue current antibiotics Vascular surgeon and podiatry on board   New onset A-fib with RVR Patient on heparin  drip Amiodarone  drip have been switched to oral  since patient is seen sinus rhythm Cardiology is on board and case discussed   Acute on chronic macrocytic anemia  - Hgb slowly owndrending from 12.5 in December 2025  - f/u folate, B12 - monitor/transfuse as indicated    Hyponatremia, mild  Sodium level improving   Parkinson's disease  Dementia with behavioral disturbance Continue delirium precaution Currently on Precedex  drip   CAD HLD Continue home statin therapy      Advance Care Planning:   Code Status: Full Code  Discussed with patient at time of admission.    Consults: Vascular Surgery, podiatry   Family Communication: Wife at bedside      Subjective:  Denies any acute overnight complaints Denies chest pain nausea vomiting   Physical Exam:   General: He is not in acute distress.    Appearance: He is not toxic-appearing.  Eyes:     Extraocular Movements: Extraocular movements intact.     Pupils: Pupils are equal, round, and reactive to light.  Cardiovascular:     Rate and Rhythm: Normal rate and regular rhythm.  Pulmonary:     Effort: Pulmonary effort is normal.     Breath sounds: Normal breath sounds.  Abdominal:     General: There is no distension.     Palpations: Abdomen is soft.     Tenderness: There is no abdominal tenderness.  Musculoskeletal:    Dressing noted to the left leg appears clean and dry Neurological: Tremors noted to the left upper extremity   Data Reviewed:  Vitals:   08/28/24 1833 08/29/24 0327 08/29/24 0500 08/29/24 0752  BP: (!) 159/76 118/69  (!) 159/70  Pulse: 89 78  85  Resp: 18 20    Temp: 98 F (36.7 C) 97.6 F (36.4 C)  98 F (36.7 C)  TempSrc:      SpO2: 95% 91%  93%  Weight:   82.1 kg   Height:          Latest Ref Rng & Units 08/29/2024    8:14 AM 08/28/2024    5:15 AM 08/27/2024   10:45 PM  CBC  WBC 4.0 - 10.5 K/uL 13.0  15.0  14.3   Hemoglobin 13.0 - 17.0 g/dL 8.9  8.1  8.0   Hematocrit 39.0 - 52.0 % 27.4  24.5  24.0   Platelets 150 - 400 K/uL 306  211   203        Latest Ref Rng & Units 08/29/2024    8:14 AM 08/28/2024    5:15 AM 08/27/2024    4:23 AM  BMP  Glucose 70 - 99 mg/dL 861  856  880   BUN 8 - 23 mg/dL 9  11  14    Creatinine 0.61 - 1.24 mg/dL 9.55  9.57  9.47   Sodium 135 - 145 mmol/L 126  129  124   Potassium 3.5 - 5.1 mmol/L 3.9  3.9  3.5   Chloride 98 - 111 mmol/L 92  96  91   CO2 22 - 32 mmol/L 26  24  23    Calcium  8.9 - 10.3 mg/dL 7.8  7.7  7.7      Author: Drue ONEIDA Potter, MD 08/29/2024 5:46 PM  For on call review [email or web address].  "

## 2024-08-29 NOTE — Plan of Care (Signed)

## 2024-08-30 DIAGNOSIS — L97529 Non-pressure chronic ulcer of other part of left foot with unspecified severity: Secondary | ICD-10-CM | POA: Diagnosis not present

## 2024-08-30 LAB — GLUCOSE, CAPILLARY
Glucose-Capillary: 136 mg/dL — ABNORMAL HIGH (ref 70–99)
Glucose-Capillary: 104 mg/dL — ABNORMAL HIGH (ref 70–99)
Glucose-Capillary: 101 mg/dL — ABNORMAL HIGH (ref 70–99)
Glucose-Capillary: 139 mg/dL — ABNORMAL HIGH (ref 70–99)

## 2024-08-30 LAB — CBC WITH DIFFERENTIAL/PLATELET
Abs Immature Granulocytes: 0.49 10*3/uL — ABNORMAL HIGH (ref 0.00–0.07)
Basophils Absolute: 0.1 10*3/uL (ref 0.0–0.1)
Basophils Relative: 1 %
Eosinophils Absolute: 0.2 10*3/uL (ref 0.0–0.5)
Eosinophils Relative: 2 %
HCT: 25.6 % — ABNORMAL LOW (ref 39.0–52.0)
Hemoglobin: 8.6 g/dL — ABNORMAL LOW (ref 13.0–17.0)
Immature Granulocytes: 4 %
Lymphocytes Relative: 10 %
Lymphs Abs: 1.3 10*3/uL (ref 0.7–4.0)
MCH: 33.3 pg (ref 26.0–34.0)
MCHC: 33.6 g/dL (ref 30.0–36.0)
MCV: 99.2 fL (ref 80.0–100.0)
Monocytes Absolute: 0.9 10*3/uL (ref 0.1–1.0)
Monocytes Relative: 7 %
Neutro Abs: 9.5 10*3/uL — ABNORMAL HIGH (ref 1.7–7.7)
Neutrophils Relative %: 76 %
Platelets: 350 10*3/uL (ref 150–400)
RBC: 2.58 MIL/uL — ABNORMAL LOW (ref 4.22–5.81)
RDW: 13.5 % (ref 11.5–15.5)
WBC: 12.5 10*3/uL — ABNORMAL HIGH (ref 4.0–10.5)
nRBC: 0 % (ref 0.0–0.2)

## 2024-08-30 LAB — BASIC METABOLIC PANEL WITH GFR
Anion gap: 10 (ref 5–15)
BUN: 7 mg/dL — ABNORMAL LOW (ref 8–23)
CO2: 23 mmol/L (ref 22–32)
Calcium: 7.8 mg/dL — ABNORMAL LOW (ref 8.9–10.3)
Chloride: 94 mmol/L — ABNORMAL LOW (ref 98–111)
Creatinine, Ser: 0.43 mg/dL — ABNORMAL LOW (ref 0.61–1.24)
GFR, Estimated: >60 mL/min
Glucose, Bld: 104 mg/dL — ABNORMAL HIGH (ref 70–99)
Potassium: 3.8 mmol/L (ref 3.5–5.1)
Sodium: 127 mmol/L — ABNORMAL LOW (ref 135–145)

## 2024-08-30 LAB — HEPARIN LEVEL (UNFRACTIONATED): Heparin Unfractionated: 0.47 [IU]/mL (ref 0.30–0.70)

## 2024-08-30 NOTE — Consult Note (Signed)
 PHARMACY - ANTICOAGULATION CONSULT NOTE  Pharmacy Consult for heparin  dosing Indication: critical limb ischemia  Allergies[1]  Patient Measurements: Height: 6' (182.9 cm) Weight: 82.1 kg (181 lb) IBW/kg (Calculated) : 77.6 HEPARIN  DW (KG): 77.1  Vital Signs: Temp: 98.2 F (36.8 C) (02/02 0426) BP: 140/65 (02/02 0426) Pulse Rate: 80 (02/02 0426)  Labs: Recent Labs    08/27/24 2245 08/28/24 0515 08/28/24 1920 08/29/24 0814 08/30/24 0439  HGB 8.0* 8.1*  --  8.9*  --   HCT 24.0* 24.5*  --  27.4*  --   PLT 203 211  --  306  --   HEPARINUNFRC  --   --  0.38 0.40 0.47  CREATININE  --  0.42*  --  0.44*  --     Estimated Creatinine Clearance: 88.9 mL/min (A) (by C-G formula based on SCr of 0.44 mg/dL (L)).   Medical History: Past Medical History:  Diagnosis Date   Compression fracture of first cervical vertebra (HCC)    Essential tremor    History of hip replacement    Ileus (HCC)     Medications:  No PTA anticoagulation  Assessment: Nicolas Solis is a 48 yoM presenting with concerns for critical limb ischemia of left foot. Past medical history is notable for coronary artery disease, Parkinson's disease, dementia, and abdominal aortic aneurysm. Patient had been complaining of worsening left foot pain, with significant pain at rest that worsens with movement. Physical exam noted for erythema of left lower extremity. ABI of left leg 0.54. Imaging of left foot from 1/27 with no evidence of osteomyelitis. Soft tissue swelling..mild soft tissue edema.  s/p LEFT SFA/Pop Stenting;TP trunk stenting 08/27/24  resuming IV heparin   Baseline Labs hgb 10.6, plt 283, INR 1.1, and aPTT 36.   Goal of Therapy:  Heparin  level 0.3-0.7 units/ml Monitor platelets by anticoagulation protocol: Yes   Plan:  2/02:  HL @ 0439 = 0.47, therapeutic X 3 - will continue this pt on current rate and recheck HL on 2/03 with AM labs.  --Continue to monitor H&H and platelets  Thank you for  involving pharmacy in this patient's care.   Jaiyanna Safran D Clinical Pharmacist 08/30/2024 5:47 AM       [1] No Known Allergies

## 2024-08-30 NOTE — Progress Notes (Signed)
 Pharmacy Antibiotic Note  Nicolas Solis is a 74 y.o. male admitted on 08/24/2024 with concern for ulcer and decreased circulation to the left foot. The patient was initially seen at a PCP appointment today and was referred to the ED due to multiple wounds, erythema, and swelling of the left foot extending up to the calf. PMH significant for Parkinson's, dementia, CAD, anemia, hyperglycemia, neuropathy, and BPH. Pharmacist. Pharmacy has been consulted for vancomycin  dosing and monitoring due to concern for cellulitis.  Plan:  Continue vancomycin  1 g IV q12h --Vanc trough in the morning to assess efficacy and rule/out toxicity   Height: 6' (182.9 cm) Weight: 79.4 kg (175 lb) IBW/kg (Calculated) : 77.6  Temp (24hrs), Avg:98.5 F (36.9 C), Min:98.2 F (36.8 C), Max:98.9 F (37.2 C)  Recent Labs  Lab 08/24/24 1407 08/25/24 0215 08/26/24 0532 08/26/24 0654 08/27/24 0423 08/27/24 1206 08/27/24 1709 08/27/24 2245 08/28/24 0515 08/29/24 0814 08/30/24 0439  WBC 15.4*   < > 12.8*   < > 12.0*   < > 13.0* 14.3* 15.0* 13.0* 12.5*  CREATININE 0.56*   < > 0.42*  --  0.52*  --   --   --  0.42* 0.44* 0.43*  LATICACIDVEN 1.3  --   --   --   --   --   --   --   --   --   --    < > = values in this interval not displayed.    Estimated Creatinine Clearance: 88.9 mL/min (A) (by C-G formula based on SCr of 0.43 mg/dL (L)).    Allergies[1]  Antimicrobials this admission: Zosyn  x 1 Ceftriaxone  1/27 >> Vancomycin  1/27 >>  Dose adjustments this admission: N/A  Microbiology results: 1/27 BCx: NGF 1/28 MRSA PCR: (-)  Thank you for allowing pharmacy to be a part of this patients care.   Bari Hamilton D 08/30/2024 12:12 PM      [1] No Known Allergies

## 2024-08-31 ENCOUNTER — Inpatient Hospital Stay

## 2024-08-31 DIAGNOSIS — L97529 Non-pressure chronic ulcer of other part of left foot with unspecified severity: Secondary | ICD-10-CM | POA: Diagnosis not present

## 2024-08-31 LAB — OSMOLALITY, URINE: Osmolality, Ur: 567 mosm/kg (ref 300–900)

## 2024-08-31 LAB — BASIC METABOLIC PANEL WITH GFR
Anion gap: 11 (ref 5–15)
BUN: 6 mg/dL — ABNORMAL LOW (ref 8–23)
CO2: 23 mmol/L (ref 22–32)
Calcium: 8.1 mg/dL — ABNORMAL LOW (ref 8.9–10.3)
Chloride: 86 mmol/L — ABNORMAL LOW (ref 98–111)
Creatinine, Ser: 0.38 mg/dL — ABNORMAL LOW (ref 0.61–1.24)
GFR, Estimated: >60 mL/min
Glucose, Bld: 128 mg/dL — ABNORMAL HIGH (ref 70–99)
Potassium: 3.9 mmol/L (ref 3.5–5.1)
Sodium: 119 mmol/L — CL (ref 135–145)

## 2024-08-31 LAB — MRSA NEXT GEN BY PCR, NASAL: MRSA by PCR Next Gen: NOT DETECTED

## 2024-08-31 LAB — CBC WITH DIFFERENTIAL/PLATELET
Abs Immature Granulocytes: 0.82 10*3/uL — ABNORMAL HIGH (ref 0.00–0.07)
Basophils Absolute: 0.1 10*3/uL (ref 0.0–0.1)
Basophils Relative: 1 %
Eosinophils Absolute: 0.2 10*3/uL (ref 0.0–0.5)
Eosinophils Relative: 1 %
HCT: 26.6 % — ABNORMAL LOW (ref 39.0–52.0)
Hemoglobin: 9 g/dL — ABNORMAL LOW (ref 13.0–17.0)
Immature Granulocytes: 5 %
Lymphocytes Relative: 7 %
Lymphs Abs: 1.2 10*3/uL (ref 0.7–4.0)
MCH: 33 pg (ref 26.0–34.0)
MCHC: 33.8 g/dL (ref 30.0–36.0)
MCV: 97.4 fL (ref 80.0–100.0)
Monocytes Absolute: 1.4 10*3/uL — ABNORMAL HIGH (ref 0.1–1.0)
Monocytes Relative: 8 %
Neutro Abs: 13.5 10*3/uL — ABNORMAL HIGH (ref 1.7–7.7)
Neutrophils Relative %: 78 %
Platelets: 437 10*3/uL — ABNORMAL HIGH (ref 150–400)
RBC: 2.73 MIL/uL — ABNORMAL LOW (ref 4.22–5.81)
RDW: 13.5 % (ref 11.5–15.5)
WBC: 17.2 10*3/uL — ABNORMAL HIGH (ref 4.0–10.5)
nRBC: 0 % (ref 0.0–0.2)

## 2024-08-31 LAB — SODIUM
Sodium: 118 mmol/L — CL (ref 135–145)
Sodium: 120 mmol/L — ABNORMAL LOW (ref 135–145)
Sodium: 115 mmol/L — CL (ref 135–145)
Sodium: 115 mmol/L — CL (ref 135–145)
Sodium: 115 mmol/L — CL (ref 135–145)

## 2024-08-31 LAB — GLUCOSE, CAPILLARY
Glucose-Capillary: 128 mg/dL — ABNORMAL HIGH (ref 70–99)
Glucose-Capillary: 133 mg/dL — ABNORMAL HIGH (ref 70–99)
Glucose-Capillary: 147 mg/dL — ABNORMAL HIGH (ref 70–99)
Glucose-Capillary: 153 mg/dL — ABNORMAL HIGH (ref 70–99)
Glucose-Capillary: 136 mg/dL — ABNORMAL HIGH (ref 70–99)

## 2024-08-31 LAB — SODIUM, URINE, RANDOM: Sodium, Ur: 211 mmol/L

## 2024-08-31 LAB — OSMOLALITY: Osmolality: 243 mosm/kg — CL (ref 275–295)

## 2024-08-31 LAB — VANCOMYCIN, TROUGH: Vancomycin Tr: 7 ug/mL — ABNORMAL LOW (ref 15–20)

## 2024-08-31 LAB — HEPARIN LEVEL (UNFRACTIONATED): Heparin Unfractionated: 0.33 [IU]/mL (ref 0.30–0.70)

## 2024-08-31 MED ORDER — IOHEXOL 300 MG/ML  SOLN
75.0000 mL | Freq: Once | INTRAMUSCULAR | Status: AC | PRN
  Administered 2024-08-31: 75 mL via INTRAVENOUS

## 2024-08-31 MED ORDER — JUVEN PO PACK
1.0000 | PACK | Freq: Two times a day (BID) | ORAL | Status: AC
  Administered 2024-08-31 – 2024-09-03 (×7): 1 via ORAL

## 2024-08-31 MED ORDER — VANCOMYCIN HCL 1250 MG/250ML IV SOLN
1250.0000 mg | Freq: Two times a day (BID) | INTRAVENOUS | Status: AC
  Administered 2024-08-31 – 2024-09-02 (×5): 1250 mg via INTRAVENOUS
  Filled 2024-08-31 (×5): qty 250

## 2024-08-31 MED ORDER — SODIUM CHLORIDE 3 % IV SOLN
INTRAVENOUS | Status: AC
  Filled 2024-08-31 (×9): qty 500

## 2024-08-31 MED ORDER — CHLORHEXIDINE GLUCONATE CLOTH 2 % EX PADS
6.0000 | MEDICATED_PAD | Freq: Every day | CUTANEOUS | Status: AC
  Administered 2024-09-01 – 2024-09-03 (×3): 6 via TOPICAL

## 2024-08-31 MED ORDER — ORAL CARE MOUTH RINSE: 15.0000 mL | OROMUCOSAL | Status: AC | PRN

## 2024-08-31 NOTE — Plan of Care (Signed)
  Problem: Education: Goal: Knowledge of General Education information will improve Description: Including pain rating scale, medication(s)/side effects and non-pharmacologic comfort measures Outcome: Progressing   Problem: Health Behavior/Discharge Planning: Goal: Ability to manage health-related needs will improve Outcome: Progressing   Problem: Clinical Measurements: Goal: Ability to maintain clinical measurements within normal limits will improve Outcome: Progressing Goal: Will remain free from infection Outcome: Progressing Goal: Diagnostic test results will improve Outcome: Progressing Goal: Respiratory complications will improve Outcome: Progressing Goal: Cardiovascular complication will be avoided Outcome: Progressing   Problem: Activity: Goal: Risk for activity intolerance will decrease Outcome: Progressing   Problem: Nutrition: Goal: Adequate nutrition will be maintained Outcome: Progressing   Problem: Coping: Goal: Level of anxiety will decrease Outcome: Progressing   Problem: Elimination: Goal: Will not experience complications related to bowel motility Outcome: Progressing Goal: Will not experience complications related to urinary retention Outcome: Progressing   Problem: Pain Managment: Goal: General experience of comfort will improve and/or be controlled Outcome: Progressing   Problem: Safety: Goal: Ability to remain free from injury will improve Outcome: Progressing   Problem: Skin Integrity: Goal: Risk for impaired skin integrity will decrease Outcome: Progressing   Problem: Education: Goal: Knowledge of the prescribed therapeutic regimen will improve Outcome: Progressing   Problem: Bowel/Gastric: Goal: Gastrointestinal status for postoperative course will improve Outcome: Progressing

## 2024-08-31 NOTE — Progress Notes (Signed)
 PHARMACY CONSULT NOTE - Hypertonic Saline  Pharmacy Consult for Hypertonic Saline Monitoring and Management  Recent Labs: Potassium (mmol/L)  Date Value  08/31/2024 3.9   Magnesium  (mg/dL)  Date Value  98/69/7973 1.8   Calcium  (mg/dL)  Date Value  97/96/7973 8.1 (L)   Albumin (g/dL)  Date Value  98/69/7973 3.0 (L)   Phosphorus (mg/dL)  Date Value  98/69/7973 2.8   Sodium (mmol/L)  Date Value  08/31/2024 115 (LL)    Assessment  Patient is a 74 y/o M with a PMH of CAD and Parkinson's disease admitted with acute limb ischemia, foot cellulitis, new-onset atrial fibrillation, and hyponatremia due to SIADH. Sodium at 119 this morning and trending down to 115 at 1624. MD ordered hypertonic saline upon transfer to the ICU. Pharmacy has been consulted to monitor hypertonic saline (3%) infusion.  Baseline Labs: Serum Na 119, Serum Osm 243, Urine Na 31, Urine Osm 651  Goal of Therapy:  Increase in Na by 4-6 mEq/L in 4-6 hours Do not exceed increase in Na by 10-12 mEq/L in 24 hours  Monitoring:  Date Time Na Rate/Comment  2/3 1845 115 @30mL /hr  Plan:   3% saline initiated at 30 mL/hr and started at 1715 Check Na Q2H x2 then Q4H  Stop infusion if  Na increases > 4 mEq/L in first 2 hours Na increases > 6 mEq/L in first 4 hours Na increases > 6 mEq/L in 6 hours  Na increases > 8 mEq/L in 8 hours (give D5W bolus) Continue to monitor for signs of clinical improvement and recommendations per nephrology  Annabella LOISE Banks, PharmD Clinical Pharmacist 08/31/2024 7:58 PM

## 2024-08-31 NOTE — Progress Notes (Signed)
 Patient is a 74 y/o M with a PMH of CAD and Parkinson's disease admitted with acute limb ischemia, foot cellulitis, new-onset atrial fibrillation, and hyponatremia due to SIADH. Sodium is now 119 and patient has orders for hypertonic saline to start upon transfer to the ICU.      Latest Ref Rng & Units 08/31/2024    5:37 AM 08/30/2024    4:39 AM 08/29/2024    8:14 AM  BMP  Glucose 70 - 99 mg/dL 871  895  861   BUN 8 - 23 mg/dL 6  7  9    Creatinine 0.61 - 1.24 mg/dL 9.61  9.56  9.55   Sodium 135 - 145 mmol/L 119  127  126   Potassium 3.5 - 5.1 mmol/L 3.9  3.8  3.9   Chloride 98 - 111 mmol/L 86  94  92   CO2 22 - 32 mmol/L 23  23  26    Calcium  8.9 - 10.3 mg/dL 8.1  7.8  7.8     Goal sodium correction < 8-10 mEq/24 hours.   Will monitor serial sodium levels.   Bari Hamilton D, PharmD, BCPS 08/31/24 11:36 AM

## 2024-08-31 NOTE — Care Management Important Message (Signed)
 Important Message  Patient Details  Name: Nicolas Solis MRN: [id number] Date of Birth: 08-Dec-1950   Important Message Given:  Yes - Medicare IM     Coden Franchi 08/31/2024, 1:59 PM

## 2024-08-31 NOTE — Progress Notes (Signed)
 Pharmacy Antibiotic Note  Nicolas Solis is a 74 y.o. male admitted on 08/24/2024 with concern for ulcer and decreased circulation to the left foot. The patient was initially seen at a PCP appointment today and was referred to the ED due to multiple wounds, erythema, and swelling of the left foot extending up to the calf. PMH significant for Parkinson's, dementia, CAD, anemia, hyperglycemia, neuropathy, and BPH. Pharmacist. Pharmacy has been consulted for vancomycin  dosing and monitoring due to concern for cellulitis.  2/3 @ 1009: vancomycin  trough = 7 mcg/mL  Plan: Will increase vancomycin  dosing to 1250 mg iv q 12 hours. Further levels if indicated depending upon renal function, culture results, and plans for antibiotics.    Height: 6' (182.9 cm) Weight: 76.2 kg (167 lb 15.9 oz) IBW/kg (Calculated) : 77.6  Temp (24hrs), Avg:98.3 F (36.8 C), Min:97.6 F (36.4 C), Max:99.2 F (37.3 C)  Recent Labs  Lab 08/24/24 1407 08/25/24 0215 08/27/24 0423 08/27/24 1206 08/27/24 2245 08/28/24 0515 08/29/24 0814 08/30/24 0439 08/31/24 0537 08/31/24 1009  WBC 15.4*   < > 12.0*   < > 14.3* 15.0* 13.0* 12.5* 17.2*  --   CREATININE 0.56*   < > 0.52*  --   --  0.42* 0.44* 0.43* 0.38*  --   LATICACIDVEN 1.3  --   --   --   --   --   --   --   --   --   VANCOTROUGH  --   --   --   --   --   --   --   --   --  7*   < > = values in this interval not displayed.    Estimated Creatinine Clearance: 87.3 mL/min (A) (by C-G formula based on SCr of 0.38 mg/dL (L)).    Allergies[1]  Antimicrobials this admission: Zosyn  x 1 Ceftriaxone  1/27 >> Vancomycin  1/27 >>  Dose adjustments this admission: N/A  Microbiology results: 1/27 BCx: NGF 1/28 MRSA PCR: (-)  Thank you for allowing pharmacy to be a part of this patients care.   [name] 08/31/2024 12:37 PM       [1] No Known Allergies

## 2024-08-31 NOTE — Consult Note (Signed)
 PHARMACY - ANTICOAGULATION CONSULT NOTE  Pharmacy Consult for heparin  dosing Indication: critical limb ischemia  Allergies[1]  Patient Measurements: Height: 6' (182.9 cm) Weight: 76.2 kg (167 lb 15.9 oz) IBW/kg (Calculated) : 77.6 HEPARIN  DW (KG): 77.1  Vital Signs: Temp: 97.6 F (36.4 C) (02/03 0353) Temp Source: Oral (02/02 2314) BP: 152/77 (02/03 0353) Pulse Rate: 80 (02/03 0353)  Labs: Recent Labs    08/29/24 0814 08/30/24 0439 08/31/24 0537  HGB 8.9* 8.6* 9.0*  HCT 27.4* 25.6* 26.6*  PLT 306 350 437*  HEPARINUNFRC 0.40 0.47 0.33  CREATININE 0.44* 0.43*  --     Estimated Creatinine Clearance: 87.3 mL/min (A) (by C-G formula based on SCr of 0.43 mg/dL (L)).   Medical History: Past Medical History:  Diagnosis Date   Compression fracture of first cervical vertebra (HCC)    Essential tremor    History of hip replacement    Ileus (HCC)     Medications:  No PTA anticoagulation  Assessment: Nicolas Solis is a 77 yoM presenting with concerns for critical limb ischemia of left foot. Past medical history is notable for coronary artery disease, Parkinson's disease, dementia, and abdominal aortic aneurysm. Patient had been complaining of worsening left foot pain, with significant pain at rest that worsens with movement. Physical exam noted for erythema of left lower extremity. ABI of left leg 0.54. Imaging of left foot from 1/27 with no evidence of osteomyelitis. Soft tissue swelling..mild soft tissue edema.  s/p LEFT SFA/Pop Stenting;TP trunk stenting 08/27/24  resuming IV heparin   Baseline Labs hgb 10.6, plt 283, INR 1.1, and aPTT 36.   Goal of Therapy:  Heparin  level 0.3-0.7 units/ml Monitor platelets by anticoagulation protocol: Yes   Plan:  2/03:  HL @ 0537 = 0.33, therapeutic X 4 - will continue this pt on current rate and recheck HL on 2/04 with AM labs.  --Continue to monitor H&H and platelets  Thank you for involving pharmacy in this patient's  care.   Nicolas Solis, PharmD, [facility] 08/31/2024 7:23 AM         [1] No Known Allergies

## 2024-08-31 NOTE — Progress Notes (Signed)
 Critical lab- Sodium 119. MD notified. Awaiting further orders.

## 2024-08-31 NOTE — Progress Notes (Signed)
 OT Cancellation Note  Patient Details Name: Nicolas Solis MRN: [id number] DOB: 1951-02-23   Cancelled Treatment:    Reason Eval/Treat Not Completed: Medical issues which prohibited therapy. Sodium currently at 119. OT to hold session. Will continue to follow and re-attempt when pt is able to safely participate.   Izetta Claude, MS, OTR/L , CBIS ascom [phone]  08/31/24, 11:24 AM

## 2024-09-01 ENCOUNTER — Other Ambulatory Visit (HOSPITAL_COMMUNITY): Payer: Self-pay

## 2024-09-01 ENCOUNTER — Telehealth (HOSPITAL_COMMUNITY): Payer: Self-pay | Admitting: Pharmacy Technician

## 2024-09-01 DIAGNOSIS — I70222 Atherosclerosis of native arteries of extremities with rest pain, left leg: Secondary | ICD-10-CM | POA: Diagnosis not present

## 2024-09-01 DIAGNOSIS — L97529 Non-pressure chronic ulcer of other part of left foot with unspecified severity: Secondary | ICD-10-CM | POA: Diagnosis not present

## 2024-09-01 DIAGNOSIS — E871 Hypo-osmolality and hyponatremia: Secondary | ICD-10-CM

## 2024-09-01 LAB — IRON AND TIBC
Iron: 11 ug/dL — ABNORMAL LOW (ref 45–182)
Saturation Ratios: 6 % — ABNORMAL LOW (ref 17.9–39.5)
TIBC: 175 ug/dL — ABNORMAL LOW (ref 250–450)
UIBC: 164 ug/dL

## 2024-09-01 LAB — CBC WITH DIFFERENTIAL/PLATELET
Abs Immature Granulocytes: 0.47 10*3/uL — ABNORMAL HIGH (ref 0.00–0.07)
Basophils Absolute: 0.1 10*3/uL (ref 0.0–0.1)
Basophils Relative: 0 %
Eosinophils Absolute: 0.2 10*3/uL (ref 0.0–0.5)
Eosinophils Relative: 1 %
HCT: 23.8 % — ABNORMAL LOW (ref 39.0–52.0)
Hemoglobin: 8.2 g/dL — ABNORMAL LOW (ref 13.0–17.0)
Immature Granulocytes: 3 %
Lymphocytes Relative: 4 %
Lymphs Abs: 0.7 10*3/uL (ref 0.7–4.0)
MCH: 32.9 pg (ref 26.0–34.0)
MCHC: 34.5 g/dL (ref 30.0–36.0)
MCV: 95.6 fL (ref 80.0–100.0)
Monocytes Absolute: 1.4 10*3/uL — ABNORMAL HIGH (ref 0.1–1.0)
Monocytes Relative: 8 %
Neutro Abs: 15 10*3/uL — ABNORMAL HIGH (ref 1.7–7.7)
Neutrophils Relative %: 84 %
Platelets: 363 10*3/uL (ref 150–400)
RBC: 2.49 MIL/uL — ABNORMAL LOW (ref 4.22–5.81)
RDW: 13.4 % (ref 11.5–15.5)
WBC: 17.8 10*3/uL — ABNORMAL HIGH (ref 4.0–10.5)
nRBC: 0 % (ref 0.0–0.2)

## 2024-09-01 LAB — BASIC METABOLIC PANEL WITH GFR
Anion gap: 11 (ref 5–15)
BUN: 9 mg/dL (ref 8–23)
CO2: 19 mmol/L — ABNORMAL LOW (ref 22–32)
Calcium: 7.8 mg/dL — ABNORMAL LOW (ref 8.9–10.3)
Chloride: 88 mmol/L — ABNORMAL LOW (ref 98–111)
Creatinine, Ser: 0.37 mg/dL — ABNORMAL LOW (ref 0.61–1.24)
GFR, Estimated: >60 mL/min
Glucose, Bld: 116 mg/dL — ABNORMAL HIGH (ref 70–99)
Potassium: 3.9 mmol/L (ref 3.5–5.1)
Sodium: 118 mmol/L — CL (ref 135–145)

## 2024-09-01 LAB — SODIUM
Sodium: 117 mmol/L — CL (ref 135–145)
Sodium: 119 mmol/L — CL (ref 135–145)
Sodium: 120 mmol/L — ABNORMAL LOW (ref 135–145)
Sodium: 123 mmol/L — ABNORMAL LOW (ref 135–145)

## 2024-09-01 LAB — RESPIRATORY PANEL BY PCR

## 2024-09-01 LAB — GLUCOSE, CAPILLARY
Glucose-Capillary: 107 mg/dL — ABNORMAL HIGH (ref 70–99)
Glucose-Capillary: 123 mg/dL — ABNORMAL HIGH (ref 70–99)
Glucose-Capillary: 126 mg/dL — ABNORMAL HIGH (ref 70–99)
Glucose-Capillary: 121 mg/dL — ABNORMAL HIGH (ref 70–99)

## 2024-09-01 LAB — HEPARIN LEVEL (UNFRACTIONATED): Heparin Unfractionated: 0.26 [IU]/mL — ABNORMAL LOW (ref 0.30–0.70)

## 2024-09-01 LAB — FOLATE: Folate: 12.9 ng/mL

## 2024-09-01 LAB — FERRITIN: Ferritin: 526 ng/mL — ABNORMAL HIGH (ref 24–336)

## 2024-09-01 LAB — PROCALCITONIN: Procalcitonin: 0.24 ng/mL

## 2024-09-01 MED ORDER — SODIUM BICARBONATE 650 MG PO TABS
1300.0000 mg | ORAL_TABLET | Freq: Two times a day (BID) | ORAL | Status: AC
  Administered 2024-09-01 – 2024-09-03 (×5): 1300 mg via ORAL
  Filled 2024-09-01 (×5): qty 2

## 2024-09-01 MED ORDER — APIXABAN 5 MG PO TABS
5.0000 mg | ORAL_TABLET | Freq: Two times a day (BID) | ORAL | Status: AC
  Administered 2024-09-01 – 2024-09-03 (×6): 5 mg via ORAL
  Filled 2024-09-01 (×6): qty 1

## 2024-09-01 MED ORDER — HEPARIN BOLUS VIA INFUSION
1200.0000 [IU] | Freq: Once | INTRAVENOUS | Status: AC
  Administered 2024-09-01: 1200 [IU] via INTRAVENOUS
  Filled 2024-09-01: qty 1200

## 2024-09-01 NOTE — Progress Notes (Signed)
 OT Cancellation Note  Patient Details Name: Nicolas Solis MRN: [id number] DOB: 11/22/1950   Cancelled Treatment:    Reason Eval/Treat Not Completed: Other (comment) (per chart, pt transferred to higher level of care, needs new orders to participate in OT. Secure chat sent to MD.)  Therisa Sheffield, OTD OTR/L  09/01/24, 1:12 PM

## 2024-09-01 NOTE — Progress Notes (Signed)
 SPIRITUAL CARE AND COUNSELING CONSULT NOTE   VISIT SUMMARY Met patient and wife during ICU rounds   SPIRITUAL ENCOUNTER                                                                                                                                                                      Type of Visit: Initial Care provided to:: Pt and family Reason for visit: Routine spiritual support OnCall Visit: No   SPIRITUAL FRAMEWORK  Presenting Themes: Goals in life/care, Rituals and practive Values/beliefs: Prayer Community/Connection: Family Strengths: Family/wife Needs/Challenges/Barriers: Unknown, very quiet Patient Stress Factors: Health changes Family Stress Factors: Health changes   GOALS   Clinical Care Goals: Increase blood flow and retain appendages   INTERVENTIONS   Spiritual Care Interventions Made: Compassionate presence, Reflective listening, Established relationship of care and support, Prayer    INTERVENTION OUTCOMES   Outcomes: Connection to spiritual care, Reduced anxiety, Awareness of support  SPIRITUAL CARE PLAN   Spiritual Care Issues Still Outstanding: Elia will continue to follow    If immediate needs arise, please contact [facility] 24 hour on call [phone]   Barnie JINNY Record, Chaplain  09/01/2024 4:27 PM

## 2024-09-01 NOTE — TOC Progression Note (Signed)
 Transition of Care [facility]) - Progression Note    Patient Details  Name: Nicolas Solis MRN: [id number] Date of Birth: June 27, 1951  [facility] CM/SW Contact  K'La JINNY Ruts, LCSW Phone Number: 09/01/2024, 3:11 PM  Clinical Narrative:    Chart reviewed. I was able to speak with the patient wife about bed offers. The patient wife reports that she would like 1. [facility], [facility]. [facility], and 3. [facility].   I have reached out to [facility] and they do not have any bed availability. SW will reach back out to wife about [facility].   SW messaged the dr. On [facility] medically readiness. The Dr. Mariea that the patient will be ready 3-5 day.   Auth will need to be start closer to medical readiness.                      Expected Discharge Plan and Services                                               Social Drivers of Health (SDOH) Interventions SDOH Screenings   Food Insecurity: No Food Insecurity (08/25/2024)  Housing: Low Risk (08/25/2024)  Transportation Needs: No Transportation Needs (08/25/2024)  Utilities: Not At Risk (08/25/2024)  Financial Resource Strain: Low Risk  (08/10/2024)   Received from [facility]  Social Connections: Moderately Isolated (08/25/2024)  Tobacco Use: Medium Risk (08/25/2024)    Readmission Risk Interventions     No data to display

## 2024-09-01 NOTE — Hospital Course (Signed)
 From HPI TSUGIO ELISON is a 74 y.o. male with medical history significant of coronary artery disease, Parkinson's disease presenting to the emergency department for evaluation of worsening pain, swelling, and discoloration of his left foot. He saw Podiatry about two weeks ago and was referred to Vascular Surgery but has not yet seen them. He was in for follow up with PCP today and was referred to the ED given concern for acute limb ischemia.  Patient was placed on heparin ,s/p LEFT SFA/Pop Stenting;TP trunk stenting 1/30.  Patient is also followed by podiatry. Patient had worsening hyponatremia on 2/3, started on 3% sodium chloride .

## 2024-09-01 NOTE — Progress Notes (Signed)
 PT Cancellation Note  Patient Details Name: Nicolas Solis MRN: [id number] DOB: February 04, 1951   Cancelled Treatment:    Reason Eval/Treat Not Completed: Medical issues which prohibited therapy Patient transferred to ICU. Will need new PT/OT orders when patient appropriate to resume therapy.   Tip Atienza 09/01/2024, 8:46 AM

## 2024-09-01 NOTE — Progress Notes (Signed)
 " Progress Note   Patient: Nicolas Solis FMW:[id number] DOB: 1951-06-25 DOA: 08/24/2024     8 DOS: the patient was seen and examined on 09/01/2024   Brief hospital course: From HPI BERISH BOHMAN is a 74 y.o. male with medical history significant of coronary artery disease, Parkinson's disease presenting to the emergency department for evaluation of worsening pain, swelling, and discoloration of his left foot. He saw Podiatry about two weeks ago and was referred to Vascular Surgery but has not yet seen them. He was in for follow up with PCP today and was referred to the ED given concern for acute limb ischemia.  Patient was placed on heparin ,s/p LEFT SFA/Pop Stenting;TP trunk stenting 1/30.  Patient is also followed by podiatry. Patient had worsening hyponatremia on 2/3, started on 3% sodium chloride .    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  This is thought to be secondary to SIADH.  Appreciate nephrology consult.  Continue monitor sodium closely.  Patient also placed on fluid restriction.    Atherosclerosis obliterans with ulceration  Gangrene involving left 4th and 5th toes On heparin  drip for the last 3 days, changed to Eliquis  as heparin  drip has made sodium level much lower with D5 as carrier fluids.  Vascular surgery took patient for angiography on 08/27/2024 Patient is also followed by podiatry, will be seeing again.   SIRS Left foot ulceration with surrounding cellulitis  - leukocytosis and tachycardia, otherwise hemodynamically stable Continue current antibiotics Cultures showed no growth to date Currently on vancomycin  and Rocephin .   New onset A-fib with RVR Patient on heparin   drip Amiodarone  drip have been switched to oral since patient is seen sinus rhythm Echo showing EF 65% with no shunt On Eliquis .   Acute on chronic macrocytic anemia  Check iron  B12 level  Multifocal pneumonia. Patient had a CT scan checking for malignancy of the chest, came back with multifocal pneumonia.  Check a viral panel.  Check a procalcitonin level.  Currently on Rocephin  and vancomycin .  Metabolic acidosis. Start sodium bicarb.   Parkinson's disease  Dementia with behavioral disturbance Patient mental status improved.  No longer has agitation   CAD HLD Continue home statin therapy      Subjective:  Patient doing relatively well, has some baseline confusion.  Physical Exam: Vitals:   09/01/24 0600 09/01/24 0800 09/01/24 1100 09/01/24 1200  BP: 120/61 (!) 115/58 (!) 97/49 (!) 103/54  Pulse: 79     Resp: (!) 25 (!) 22 (!) 25 (!) 22  Temp:  98.8 F (37.1 C)  98.5 F (36.9 C)  TempSrc:  Oral  Oral  SpO2: 95%  97% 98%  Weight:      Height:       General exam: Appears calm and comfortable  Respiratory system: Clear to auscultation. Respiratory effort normal. Cardiovascular system: S1 & S2 heard, RRR. No JVD, murmurs, rubs, gallops or clicks. No pedal edema. Gastrointestinal system: Abdomen is nondistended, soft and nontender. No organomegaly or masses felt. Normal bowel sounds heard. Central nervous system: Alert and oriented x2. No focal neurological deficits. Extremities: Symmetric 5 x 5 power. Skin: No rashes, lesions or ulcers Psychiatry: Flat affect'  Data Reviewed:  CT scan and lab results reviewed  Family Communication: Wife updated at bedside.  Disposition: Status is: Inpatient Remains inpatient appropriate because: Severity of disease, IV treatment.     Time spent: 55 minutes  Author: Murvin Mana, MD 09/01/2024 2:53 PM  For on call review [email or web address].    "

## 2024-09-01 NOTE — Progress Notes (Signed)
 PHARMACY CONSULT NOTE - Hypertonic Saline  Pharmacy Consult for Hypertonic Saline Monitoring and Management  Recent Labs: Potassium (mmol/L)  Date Value  09/01/2024 3.9   Magnesium  (mg/dL)  Date Value  98/69/7973 1.8   Calcium  (mg/dL)  Date Value  97/95/7973 7.8 (L)   Albumin (g/dL)  Date Value  98/69/7973 3.0 (L)   Phosphorus (mg/dL)  Date Value  98/69/7973 2.8   Sodium (mmol/L)  Date Value  09/01/2024 119 (LL)   Assessment  Patient is a 74 y/o M with a PMH of CAD and Parkinson's disease admitted with acute limb ischemia, foot cellulitis, new-onset atrial fibrillation, and hyponatremia due to SIADH. Sodium at 119 this morning and trending down to 115 at 1624. MD ordered hypertonic saline upon transfer to the ICU. Pharmacy has been consulted to monitor hypertonic saline (3%) infusion.  Baseline Labs: Serum Na 119, Serum Osm 243, Urine Na 31, Urine Osm 651  Goal of Therapy:  Increase in Na by 4-6 mEq/L in 4-6 hours Do not exceed increase in Na by 10-12 mEq/L in 24 hours  Monitoring:  Date Time Na Rate/Comment  2/3 1845 115 HTS at 30 mL/hr 2/4 0839 119 HTS at 30 mL/hr  Plan:  Continue HTS at 30 mL/hr. Currently at safe rate of increase Check Na checks q4h  Stop infusion if:  Na increases > 4 mEq/L in first 2 hours Na increases > 6 mEq/L in first 4 hours Na increases > 6 mEq/L in 6 hours  Na increases > 8 mEq/L in 8 hours (give D5W bolus) Continue to monitor for signs of clinical improvement and recommendations per nephrology  Marolyn KATHEE Mare 09/01/2024 11:17 AM

## 2024-09-01 NOTE — Consult Note (Signed)
 PHARMACY - ANTICOAGULATION CONSULT NOTE  Pharmacy Consult for heparin  dosing Indication: critical limb ischemia  Allergies[1]  Patient Measurements: Height: 6' (182.9 cm) Weight: 76.2 kg (167 lb 15.9 oz) IBW/kg (Calculated) : 77.6 HEPARIN  DW (KG): 77.1  Vital Signs: Temp: 98.7 F (37.1 C) (02/04 0200) Temp Source: Oral (02/04 0200) BP: 124/60 (02/04 0300) Pulse Rate: 82 (02/04 0300)  Labs: Recent Labs    08/29/24 0814 08/30/24 0439 08/31/24 0537 09/01/24 0452  HGB 8.9* 8.6* 9.0* 8.2*  HCT 27.4* 25.6* 26.6* 23.8*  PLT 306 350 437* 363  HEPARINUNFRC 0.40 0.47 0.33 0.26*  CREATININE 0.44* 0.43* 0.38*  --     Estimated Creatinine Clearance: 87.3 mL/min (A) (by C-G formula based on SCr of 0.38 mg/dL (L)).   Medical History: Past Medical History:  Diagnosis Date   Compression fracture of first cervical vertebra (HCC)    Essential tremor    History of hip replacement    Ileus (HCC)     Medications:  No PTA anticoagulation  Assessment: Nicolas Solis is a 73 yoM presenting with concerns for critical limb ischemia of left foot. Past medical history is notable for coronary artery disease, Parkinson's disease, dementia, and abdominal aortic aneurysm. Patient had been complaining of worsening left foot pain, with significant pain at rest that worsens with movement. Physical exam noted for erythema of left lower extremity. ABI of left leg 0.54. Imaging of left foot from 1/27 with no evidence of osteomyelitis. Soft tissue swelling..mild soft tissue edema.  s/p LEFT SFA/Pop Stenting;TP trunk stenting 08/27/24  resuming IV heparin   Baseline Labs hgb 10.6, plt 283, INR 1.1, and aPTT 36.   Goal of Therapy:  Heparin  level 0.3-0.7 units/ml Monitor platelets by anticoagulation protocol: Yes   Plan:  2/04:  HL @ 0452 = 0.26, subtherapeutic  --Bolus 1200 units x 1 --Increase heparin  infusion to 1950 units/hr --Will recheck HL in 8 hrs after rate change  --Continue to  monitor H&H and platelets daily  Thank you for involving pharmacy in this patient's care.   Rankin CANDIE Dills, PharmD, [facility] 09/01/2024 6:04 AM          [1] No Known Allergies

## 2024-09-01 NOTE — Plan of Care (Signed)
" °  Problem: Education: Goal: Knowledge of General Education information will improve Description: Including pain rating scale, medication(s)/side effects and non-pharmacologic comfort measures Outcome: Not Progressing   Problem: Health Behavior/Discharge Planning: Goal: Ability to manage health-related needs will improve Outcome: Not Progressing   Problem: Clinical Measurements: Goal: Ability to maintain clinical measurements within normal limits will improve Outcome: Progressing Goal: Will remain free from infection Outcome: Not Progressing Goal: Diagnostic test results will improve Outcome: Not Progressing   "

## 2024-09-01 NOTE — Progress Notes (Signed)
 " [facility]  ROUNDING NOTE   Subjective:   Nicolas Solis is a 74 y.o. male with past medical history of Parkinson's, tobacco and alcohol abuse. He presents to ED with left foot wounds, redness and swelling. Is currently admitted for Cellulitis of left lower extremity [L03.116] Critical limb ischemia of left lower extremity (HCC) [I70.222] Chronic ulcer of left foot, unspecified ulcer stage [facility]) [L97.529]  Patient is known to our practice from previous admissions.  States he began having worsening left foot pain over the past week.  He started having chills with no known fever 2-3 nights prior to ED arrival.  Patient does endorse tobacco use, states he quit smoking 20 to 30 years ago.  Reports appetite has remained appropriate.  Denies nausea or vomiting.  Sodium on ED arrival 131 and has decreased to 119 at time of evaluation.  TSH acceptable.  Urine osmolality acceptable.  CT left foot negative for osteomyelitis, soft tissue swelling noted.  Chest x-ray shows mild diffuse interstitial edema.  Update:  Patient did undergo CT of the chest yesterday.  This showed multifocal pneumonia.  After we saw the patient this serum sodium dropped to 115.  He was started on 3% saline and serum sodium level up to 118.    Objective:  Vital signs in last 24 hours:  Temp:  [98.1 F (36.7 C)-98.7 F (37.1 C)] 98.7 F (37.1 C) (02/04 0200) Pulse Rate:  [78-87] 79 (02/04 0600) Resp:  [18-27] 25 (02/04 0600) BP: (120-157)/(60-83) 120/61 (02/04 0600) SpO2:  [90 %-95 %] 95 % (02/04 0600) Weight:  [77 kg] 77 kg (02/04 0500)  Weight change: 0.8 kg Filed Weights   08/30/24 0500 08/31/24 0353 09/01/24 0500  Weight: 79.4 kg 76.2 kg 77 kg    Intake/Output: I/O last 3 completed shifts: In: 2625.7 [P.O.:420; I.V.:1149.3; IV Piggyback:1056.5] Out: 3095 [Urine:3095]   Intake/Output this shift:  No intake/output data recorded.  Physical Exam: General: NAD  Head: Normocephalic,  atraumatic. Moist oral mucosal membranes  Eyes: Anicteric  Lungs:  Scattered rhonchi with upper lobe.  Without  Data skin heart: Regular rate and rhythm  Abdomen:  Soft, nontender  Extremities: No peripheral edema.  Left foot gauze dressing  Neurologic: Awake, alert, conversant  Skin: Warm,dry, no rash  Access: None    Basic Metabolic Panel: Recent Labs  Lab 08/27/24 0423 08/28/24 0515 08/29/24 9185 08/30/24 0439 08/31/24 0537 08/31/24 1009 08/31/24 1624 08/31/24 1845 08/31/24 2053 09/01/24 0333 09/01/24 0452  NA 124* 129* 126* 127* 119*   < > 115* 115* 115* 117* 118*  K 3.5 3.9 3.9 3.8 3.9  --   --   --   --   --  3.9  CL 91* 96* 92* 94* 86*  --   --   --   --   --  88*  CO2 23 24 26 23 23   --   --   --   --   --  19*  GLUCOSE 119* 143* 138* 104* 128*  --   --   --   --   --  116*  BUN 14 11 9  7* 6*  --   --   --   --   --  9  CREATININE 0.52* 0.42* 0.44* 0.43* 0.38*  --   --   --   --   --  0.37*  CALCIUM  7.7* 7.7* 7.8* 7.8* 8.1*  --   --   --   --   --  7.8*  MG 1.8  --   --   --   --   --   --   --   --   --   --   PHOS 2.8  --   --   --   --   --   --   --   --   --   --    < > = values in this interval not displayed.    Liver Function Tests: Recent Labs  Lab 08/27/24 1709  AST 84*  ALT 12  ALKPHOS 201*  BILITOT 0.4  PROT 6.9  ALBUMIN 3.0*   No results for input(s): LIPASE, AMYLASE in the last 168 hours. No results for input(s): AMMONIA in the last 168 hours.  CBC: Recent Labs  Lab 08/27/24 0423 08/27/24 1206 08/28/24 0515 08/29/24 0814 08/30/24 0439 08/31/24 0537 09/01/24 0452  WBC 12.0*   < > 15.0* 13.0* 12.5* 17.2* 17.8*  NEUTROABS 9.6*  --  13.1*  --  9.5* 13.5* 15.0*  HGB 8.2*   < > 8.1* 8.9* 8.6* 9.0* 8.2*  HCT 25.0*   < > 24.5* 27.4* 25.6* 26.6* 23.8*  MCV 100.0   < > 100.4* 100.0 99.2 97.4 95.6  PLT 192   < > 211 306 350 437* 363   < > = values in this interval not displayed.    Cardiac Enzymes: No results for input(s):  CKTOTAL, CKMB, CKMBINDEX, TROPONINI in the last 168 hours.  BNP: Invalid input(s): POCBNP  CBG: Recent Labs  Lab 08/31/24 1256 08/31/24 1504 08/31/24 1923 08/31/24 2118 09/01/24 0649  GLUCAP 133* 147* 153* 136* 107*    Microbiology: Results for orders placed or performed during the hospital encounter of 08/24/24  Blood culture (single)     Status: None   Collection Time: 08/24/24  2:07 PM   Specimen: BLOOD  Result Value Ref Range Status   Specimen Description BLOOD RIGHT ANTECUBITAL  Final   Special Requests   Final    BOTTLES DRAWN AEROBIC AND ANAEROBIC Blood Culture adequate volume   Culture   Final    NO GROWTH 5 DAYS Performed at [facility], [address], [place], [place] [address]    Report Status 08/29/2024 FINAL  Final  MRSA Next Gen by PCR, Nasal     Status: None   Collection Time: 08/25/24  4:45 PM   Specimen: Nasal Mucosa; Nasal Swab  Result Value Ref Range Status   MRSA by PCR Next Gen NOT DETECTED NOT DETECTED Final    Comment: (NOTE) The GeneXpert MRSA Assay (FDA approved for NASAL specimens only), is one component of a comprehensive MRSA colonization surveillance program. It is not intended to diagnose MRSA infection nor to guide or monitor treatment for MRSA infections. Test performance is not FDA approved in patients less than 21 years old. Performed at [facility], [address] [place], [place] [address]   MRSA Next Gen by PCR, Nasal     Status: None   Collection Time: 08/31/24  3:02 PM   Specimen: Nasal Mucosa; Nasal Swab  Result Value Ref Range Status   MRSA by PCR Next Gen NOT DETECTED NOT DETECTED Final    Comment: (NOTE) The GeneXpert MRSA Assay (FDA approved for NASAL specimens only), is one component of a comprehensive MRSA colonization surveillance program. It is not intended to diagnose MRSA infection nor to guide or monitor treatment for MRSA infections. Test performance is not FDA approved in  patients less  than 76 years old. Performed at [facility], [address] [place], [place] [address]     Coagulation Studies: No results for input(s): LABPROT, INR in the last 72 hours.  Urinalysis: No results for input(s): COLORURINE, LABSPEC, PHURINE, GLUCOSEU, HGBUR, BILIRUBINUR, KETONESUR, PROTEINUR, UROBILINOGEN, NITRITE, LEUKOCYTESUR in the last 72 hours.  Invalid input(s): APPERANCEUR    Imaging: CT CHEST W CONTRAST Result Date: 09/01/2024 CLINICAL DATA:  Hyponatremia. EXAM: CT CHEST WITH CONTRAST TECHNIQUE: Multidetector CT imaging of the chest was performed during intravenous contrast administration. RADIATION DOSE REDUCTION: This exam was performed according to the departmental dose-optimization program which includes automated exposure control, adjustment of the mA and/or kV according to patient size and/or use of iterative reconstruction technique. CONTRAST:  75mL OMNIPAQUE  IOHEXOL  300 MG/ML  SOLN COMPARISON:  10/07/2022 FINDINGS: Cardiovascular: The heart size is upper normal to borderline enlarged. Coronary artery calcification is evident. Aortic valve calcification evident. Mitral valve calcification evident Mild atherosclerotic calcification is noted in the wall of the thoracic aorta. Mediastinum/Nodes: No mediastinal lymphadenopathy. There is no hilar lymphadenopathy. The esophagus has normal imaging features. There is no axillary lymphadenopathy. Lungs/Pleura: Fine architectural detail of the lung parenchyma is obscured by breathing motion. Patchy areas of ground-glass opacity are identified in both upper lobes with collapse/consolidative disease in the lower lobes bilaterally. No overt findings of pulmonary edema. Small to moderate relatively symmetric pleural effusions evident. Upper Abdomen: The stomach is distended with food and fluid. Musculoskeletal: No worrisome lytic or sclerotic osseous abnormality. IMPRESSION: 1. Patchy areas of  ground-glass opacity in both upper lobes. Imaging features are compatible with multifocal pneumonia. 2. Bilateral lower lobe dependent collapse/consolidation. Findings may be atelectatic although superimposed infection not excluded. 3. Small to moderate relatively symmetric bilateral pleural effusions. 4.  Aortic Atherosclerosis (ICD10-I70.0). Electronically Signed   By: Camellia Candle M.D.   On: 09/01/2024 05:49     Medications:    cefTRIAXone  (ROCEPHIN )  IV Stopped (08/31/24 2233)   heparin  1,950 Units/hr (09/01/24 0622)   sodium chloride  (hypertonic) 30 mL/hr at 09/01/24 0200   vancomycin  Stopped (09/01/24 0004)    amiodarone   200 mg Oral Daily   vitamin C   500 mg Oral BID   aspirin  EC  81 mg Oral Daily   atorvastatin   20 mg Oral Daily   carbidopa -levodopa   2 tablet Oral TID AC   Carbidopa -Levodopa  ER  1 tablet Oral QHS   Chlorhexidine  Gluconate Cloth  6 each Topical Q0600   docusate sodium   100 mg Oral BID   gabapentin   100 mg Oral BID   multivitamin with minerals  1 tablet Oral Daily   nutrition supplement (JUVEN)  1 packet Oral BID BM   tamsulosin   0.4 mg Oral Daily   acetaminophen  **OR** acetaminophen , metoprolol  tartrate, morphine  injection, ondansetron  (ZOFRAN ) IV, ondansetron  **OR** ondansetron  (ZOFRAN ) IV, mouth rinse, oxyCODONE , polyethylene glycol  Assessment/ Plan:  Mr. Nicolas Solis is a 74 y.o.  male with past medical history of Parkinson's, tobacco and alcohol abuse. He presents to ED with left foot wounds, redness and swelling. Is currently admitted for Cellulitis of left lower extremity [L03.116] Critical limb ischemia of left lower extremity (HCC) [I70.222] Chronic ulcer of left foot, unspecified ulcer stage (HCC) [L97.529]   Acute hyponatremia, serum sodium 131 upon admission and down to 118 at time of our evaluation Bacterial pneumonia. Acute metabolic acidosis with serum bicarbonate of 19.  Plan:  Shortly after our initial evaluation serum sodium  dropped to 115.  Patient was subsequently started  on 3% saline.  Serum sodium now up to 118.  We will target serum sodium of 125 by the end of today.  He also has a multifocal pneumonia.  Currently maintained on ceftriaxone  and vancomycin .    LOS: 8 Naomia Lenderman 2/4/20269:53 AM   "

## 2024-09-01 NOTE — Telephone Encounter (Signed)
 Patient Product/process Development Scientist completed.    The patient is insured through [employer]. Patient has Medicare and is not eligible for a copay card, but may be able to apply for patient assistance or Medicare RX Payment Plan (Patient Must reach out to their plan, if eligible for payment plan), if available.    Ran test claim for Eliquis  5 mg and the current 30 day co-pay is $249.70 due to a deductible.   This test claim was processed through [facility]- copay amounts may vary at other pharmacies due to pharmacy/plan contracts, or as the patient moves through the different stages of their insurance plan.     Reyes Sharps, CPHT Pharmacy Technician Patient Advocate Specialist Lead [facility] Patient Advocate Team Direct Number: [phone]  Fax: [phone]

## 2024-09-02 DIAGNOSIS — I70222 Atherosclerosis of native arteries of extremities with rest pain, left leg: Secondary | ICD-10-CM | POA: Diagnosis not present

## 2024-09-02 DIAGNOSIS — I96 Gangrene, not elsewhere classified: Secondary | ICD-10-CM | POA: Insufficient documentation

## 2024-09-02 DIAGNOSIS — I480 Paroxysmal atrial fibrillation: Secondary | ICD-10-CM | POA: Diagnosis not present

## 2024-09-02 DIAGNOSIS — J188 Other pneumonia, unspecified organism: Secondary | ICD-10-CM | POA: Diagnosis not present

## 2024-09-02 DIAGNOSIS — L97529 Non-pressure chronic ulcer of other part of left foot with unspecified severity: Secondary | ICD-10-CM | POA: Diagnosis not present

## 2024-09-02 LAB — BASIC METABOLIC PANEL WITH GFR
Anion gap: 10 (ref 5–15)
BUN: 17 mg/dL (ref 8–23)
CO2: 19 mmol/L — ABNORMAL LOW (ref 22–32)
Calcium: 7.5 mg/dL — ABNORMAL LOW (ref 8.9–10.3)
Chloride: 94 mmol/L — ABNORMAL LOW (ref 98–111)
Creatinine, Ser: 0.43 mg/dL — ABNORMAL LOW (ref 0.61–1.24)
GFR, Estimated: >60 mL/min
Glucose, Bld: 103 mg/dL — ABNORMAL HIGH (ref 70–99)
Potassium: 3.8 mmol/L (ref 3.5–5.1)
Sodium: 124 mmol/L — ABNORMAL LOW (ref 135–145)

## 2024-09-02 LAB — CBC
HCT: 21.2 % — ABNORMAL LOW (ref 39.0–52.0)
Hemoglobin: 7.1 g/dL — ABNORMAL LOW (ref 13.0–17.0)
MCH: 32.6 pg (ref 26.0–34.0)
MCHC: 33.5 g/dL (ref 30.0–36.0)
MCV: 97.2 fL (ref 80.0–100.0)
Platelets: 315 10*3/uL (ref 150–400)
RBC: 2.18 MIL/uL — ABNORMAL LOW (ref 4.22–5.81)
RDW: 13.6 % (ref 11.5–15.5)
WBC: 10.6 10*3/uL — ABNORMAL HIGH (ref 4.0–10.5)
nRBC: 0 % (ref 0.0–0.2)

## 2024-09-02 LAB — VITAMIN B12: Vitamin B-12: 3616 pg/mL — ABNORMAL HIGH (ref 180–914)

## 2024-09-02 LAB — GLUCOSE, CAPILLARY
Glucose-Capillary: 99 mg/dL (ref 70–99)
Glucose-Capillary: 131 mg/dL — ABNORMAL HIGH (ref 70–99)
Glucose-Capillary: 111 mg/dL — ABNORMAL HIGH (ref 70–99)
Glucose-Capillary: 126 mg/dL — ABNORMAL HIGH (ref 70–99)

## 2024-09-02 LAB — PREPARE RBC (CROSSMATCH)

## 2024-09-02 LAB — PHOSPHORUS: Phosphorus: 2.5 mg/dL (ref 2.5–4.6)

## 2024-09-02 LAB — MAGNESIUM: Magnesium: 1.9 mg/dL (ref 1.7–2.4)

## 2024-09-02 LAB — SODIUM
Sodium: 121 mmol/L — ABNORMAL LOW (ref 135–145)
Sodium: 123 mmol/L — ABNORMAL LOW (ref 135–145)
Sodium: 127 mmol/L — ABNORMAL LOW (ref 135–145)

## 2024-09-02 LAB — HEMOGLOBIN: Hemoglobin: 6.7 g/dL — ABNORMAL LOW (ref 13.0–17.0)

## 2024-09-02 LAB — ABO/RH: ABO/RH(D): O POS

## 2024-09-02 MED ORDER — POLYSACCHARIDE IRON COMPLEX 150 MG PO CAPS
150.0000 mg | ORAL_CAPSULE | Freq: Every day | ORAL | Status: AC
  Administered 2024-09-02 – 2024-09-03 (×2): 150 mg via ORAL
  Filled 2024-09-02 (×2): qty 1

## 2024-09-02 MED ORDER — SODIUM CHLORIDE 1 G PO TABS
1.0000 g | ORAL_TABLET | Freq: Two times a day (BID) | ORAL | Status: AC
  Administered 2024-09-02 – 2024-09-03 (×3): 1 g via ORAL
  Filled 2024-09-02 (×4): qty 1

## 2024-09-02 MED ORDER — OXYCODONE HCL 5 MG PO TABS
10.0000 mg | ORAL_TABLET | ORAL | Status: AC | PRN
  Administered 2024-09-02 – 2024-09-03 (×3): 10 mg via ORAL
  Filled 2024-09-02 (×3): qty 2

## 2024-09-02 MED ORDER — SODIUM CHLORIDE 0.9% IV SOLUTION: Freq: Once | INTRAVENOUS | Status: AC

## 2024-09-02 MED ORDER — AMOXICILLIN-POT CLAVULANATE 875-125 MG PO TABS
1.0000 | ORAL_TABLET | Freq: Two times a day (BID) | ORAL | Status: AC
  Administered 2024-09-03 (×2): 1 via ORAL
  Filled 2024-09-02 (×2): qty 1

## 2024-09-02 MED ORDER — DOXYCYCLINE HYCLATE 100 MG PO TABS
100.0000 mg | ORAL_TABLET | Freq: Two times a day (BID) | ORAL | Status: AC
  Administered 2024-09-03 (×2): 100 mg via ORAL
  Filled 2024-09-02 (×2): qty 1

## 2024-09-02 NOTE — Progress Notes (Signed)
 PHARMACY CONSULT NOTE - Hypertonic Saline  Pharmacy Consult for Hypertonic Saline Monitoring and Management  Recent Labs: Potassium (mmol/L)  Date Value  09/02/2024 3.8   Magnesium  (mg/dL)  Date Value  97/94/7973 1.9   Calcium  (mg/dL)  Date Value  97/94/7973 7.5 (L)   Albumin (g/dL)  Date Value  98/69/7973 3.0 (L)   Phosphorus (mg/dL)  Date Value  97/94/7973 2.5   Sodium (mmol/L)  Date Value  09/02/2024 127 (L)   Assessment  Patient is a 74 y/o M with a PMH of CAD and Parkinson's disease admitted with acute limb ischemia, foot cellulitis, new-onset atrial fibrillation, and hyponatremia due to SIADH. Sodium at 119 this morning and trending down to 115 at 1624. MD ordered hypertonic saline upon transfer to the ICU. Pharmacy has been consulted to monitor hypertonic saline (3%) infusion.  Baseline Labs: Serum Na 119, Serum Osm 243, Urine Na 31, Urine Osm 651  Goal of Therapy:  Increase in Na by 4-6 mEq/L in 4-6 hours Do not exceed increase in Na by 10-12 mEq/L in 24 hours  Monitoring:  Date Time Na Rate/Comment  2/3 1845 115 HTS at 30 mL/hr 2/4 0839 119 HTS at 30 mL/hr 2/4       1303    120     HTS at 30 mL/hr 2/4       1902    123     HTS at 30 mL/hr 2/5        0123   121     HTS at 30 mL/hr 2/5        0556   124     HTS at 30 mL/hr 2/5        1231   123     HTS at 30 mL/hr 2/5        1848   127     HTS at 30 mL/hr  Plan:  Continue HTS at 30 mL/hr. Currently at safe rate of increase Na checks q6h currently ordered  Stop infusion if:  Na increases > 4 mEq/L in first 2 hours Na increases > 6 mEq/L in first 4 hours Na increases > 6 mEq/L in 6 hours  Na increases > 8 mEq/L in 8 hours (give D5W bolus) Continue to monitor for signs of clinical improvement and recommendations per nephrology  Nicolas Solis 09/02/2024 7:26 PM

## 2024-09-02 NOTE — Evaluation (Addendum)
 Physical Therapy Evaluation Patient Details Name: EHSAN CORVIN MRN: [id number] DOB: May 02, 1951 Today's Date: 09/02/2024  History of Present Illness  74 year old male presented to the ED with worsening pain, swelling, discoloration of L foot; admitted with SRIS from left foot ulercation with surrounding cellulitis, new onset afib with RVR; now s/p  LEFT SFA/Pop Stenting;TP trunk stenting 08/27/24         PMH significant for coronary artery disease, Parkinson's disease, dementia with behavioral disturbance. Patient transferred to ICU for critical sodium level and is now appropriate to begin therapy again.   Clinical Impression  Patient received in bed, he is alert and agreeable to PT/OT re-assessment. Patient reports left foot pain. He requires max +2 assist with bed mobility. Patient is quite anxious throughout session and is limited by fatigue sitting edge of bed, requires heavy B UE support to maintain sitting. He will continue to benefit from skilled PT to improve strength, endurance and independence.           If plan is discharge home, recommend the following: Two people to help with walking and/or transfers;A lot of help with bathing/dressing/bathroom   Can travel by private vehicle   No    Equipment Recommendations Other (comment) (TBD)  Recommendations for Other Services       Functional Status Assessment Patient has had a recent decline in their functional status and demonstrates the ability to make significant improvements in function in a reasonable and predictable amount of time.     Precautions / Restrictions Precautions Precautions: Fall Recall of Precautions/Restrictions: Impaired Restrictions Weight Bearing Restrictions Per Provider Order: Yes LLE Weight Bearing Per Provider Order: Touchdown weight bearing Other Position/Activity Restrictions: limited walking/transfers only per podiatry 1/31, heel weight bearing in post op shoe      Mobility  Bed  Mobility Overal bed mobility: Needs Assistance Bed Mobility: Supine to Sit, Sit to Supine     Supine to sit: Max assist, +2 for physical assistance, Used rails Sit to supine: Max assist, +2 for physical assistance, Used rails   General bed mobility comments: Patient requires extensive assistance and is partially limited by anxiety with movement.    Transfers                   General transfer comment: not attempted this session    Ambulation/Gait               General Gait Details: not attempted  Stairs            Wheelchair Mobility     Tilt Bed    Modified Rankin (Stroke Patients Only)       Balance Overall balance assessment: Needs assistance Sitting-balance support: Bilateral upper extremity supported, Feet unsupported Sitting balance-Leahy Scale: Fair Sitting balance - Comments: patient able to sit edge of bed for several minutes however reliant on B UE support and is working hard to maintain sitting balance. Postural control: Posterior lean                                   Pertinent Vitals/Pain Pain Assessment Pain Assessment: Faces Faces Pain Scale: Hurts little more Pain Location: L foot Pain Intervention(s): Monitored during session, Repositioned    Home Living Family/patient expects to be discharged to:: Private residence Living Arrangements: Spouse/significant other Available Help at Discharge: Family;Available 24 hours/day Type of Home: House Home Access: Level entry  Home Layout: Able to live on main level with bedroom/bathroom Home Equipment: Rolling Walker (2 wheels);Rollator (4 wheels);Cane - single point;Shower seat;BSC/3in1      Prior Function Prior Level of Function : Needs assist             Mobility Comments: until approx 1 month ago, pt was amb with AD household/community distances; participating in rock stedy boxing, significant decrease in overall function in the last month with pt amb  limited distances around house ADLs Comments: was MOD I-I in ADL prior to approx 1 month ago, now requiring some assist for ADLs, wife assist with IADLs     Extremity/Trunk Assessment   Upper Extremity Assessment Upper Extremity Assessment: Generalized weakness    Lower Extremity Assessment Lower Extremity Assessment: Generalized weakness    Cervical / Trunk Assessment Cervical / Trunk Assessment: Normal  Communication   Communication Communication: Impaired Factors Affecting Communication: Reduced clarity of speech    Cognition Arousal: Alert Behavior During Therapy: Anxious                             Following commands: Impaired Following commands impaired: Follows one step commands with increased time     Cueing Cueing Techniques: Verbal cues, Tactile cues, Gestural cues     General Comments      Exercises     Assessment/Plan    PT Assessment Patient needs continued PT services  PT Problem List Decreased strength;Decreased activity tolerance;Decreased balance;Decreased mobility;Decreased knowledge of use of DME;Decreased safety awareness;Pain;Decreased knowledge of precautions;Decreased skin integrity;Decreased coordination       PT Treatment Interventions DME instruction;Gait training;Functional mobility training;Therapeutic activities;Therapeutic exercise;Patient/family education    PT Goals (Current goals can be found in the Care Plan section)  Acute Rehab PT Goals Patient Stated Goal: get walking better again PT Goal Formulation: With family Time For Goal Achievement: 09/10/24 Potential to Achieve Goals: Fair    Frequency Min 2X/week     Co-evaluation PT/OT/SLP Co-Evaluation/Treatment: Yes Reason for Co-Treatment: For patient/therapist safety;To address functional/ADL transfers PT goals addressed during session: Mobility/safety with mobility;Balance         AM-PAC PT 6 Clicks Mobility  Outcome Measure Help needed turning from  your back to your side while in a flat bed without using bedrails?: A Lot Help needed moving from lying on your back to sitting on the side of a flat bed without using bedrails?: A Lot Help needed moving to and from a bed to a chair (including a wheelchair)?: Total Help needed standing up from a chair using your arms (e.g., wheelchair or bedside chair)?: Total Help needed to walk in hospital room?: Total Help needed climbing 3-5 steps with a railing? : Total 6 Click Score: 8    End of Session   Activity Tolerance: Patient limited by fatigue;Other (comment) (anxious) Patient left: in bed;with call bell/phone within reach;with family/visitor present Nurse Communication: Mobility status PT Visit Diagnosis: Other abnormalities of gait and mobility (R26.89);Muscle weakness (generalized) (M62.81);Pain Pain - Right/Left: Left Pain - part of body: Ankle and joints of foot    Time: 1023-1040 PT Time Calculation (min) (ACUTE ONLY): 17 min   Charges:   PT Evaluation $PT Re-evaluation: 1 Re-eval   PT General Charges $$ ACUTE PT VISIT: 1 Visit         Tylynn Braniff, PT, GCS 09/02/24,10:55 AM

## 2024-09-02 NOTE — Progress Notes (Signed)
 " [facility]  ROUNDING NOTE   Subjective:   ASCENCION COYE is a 74 y.o. male with past medical history of Parkinson's, tobacco and alcohol abuse. He presents to ED with left foot wounds, redness and swelling. Is currently admitted for Cellulitis of left lower extremity [L03.116] Critical limb ischemia of left lower extremity (HCC) [I70.222] Chronic ulcer of left foot, unspecified ulcer stage [facility]) [L97.529]  Patient is known to our practice from previous admissions.  States he began having worsening left foot pain over the past week.  He started having chills with no known fever 2-3 nights prior to ED arrival.  Patient does endorse tobacco use, states he quit smoking 20 to 30 years ago.  Reports appetite has remained appropriate.  Denies nausea or vomiting.  Sodium on ED arrival 131 and has decreased to 119 at time of evaluation.  TSH acceptable.  Urine osmolality acceptable.  CT left foot negative for osteomyelitis, soft tissue swelling noted.  Chest x-ray shows mild diffuse interstitial edema.  Update:  Patient with multifocal pneumonia and hyponatremia due to SIADH. Serum sodium was up to 124 earlier this a.m.    Objective:  Vital signs in last 24 hours:  Temp:  [98.1 F (36.7 C)-99 F (37.2 C)] 98.1 F (36.7 C) (02/05 0800) Pulse Rate:  [77-89] 78 (02/05 1345) Resp:  [20-30] 26 (02/05 1345) BP: (99-149)/(51-93) 127/64 (02/05 1300) SpO2:  [89 %-98 %] 94 % (02/05 1345) Weight:  [77 kg] 77 kg (02/05 0420)  Weight change: 0 kg Filed Weights   08/31/24 0353 09/01/24 0500 09/02/24 0420  Weight: 76.2 kg 77 kg 77 kg    Intake/Output: I/O last 3 completed shifts: In: 3111.7 [P.O.:720; I.V.:1438; IV Piggyback:953.6] Out: 1050 [Urine:1050]   Intake/Output this shift:  Total I/O In: 630.1 [P.O.:480; I.V.:150.1] Out: 450 [Urine:450]  Physical Exam: General: NAD  Head: Normocephalic, atraumatic. Moist oral mucosal membranes  Eyes: Anicteric  Lungs:  Scattered  rhonchi with upper lobe.  Without  Data skin heart: Regular rate and rhythm  Abdomen:  Soft, nontender  Extremities: No peripheral edema.  Left foot gauze dressing  Neurologic: Awake, alert, conversant  Skin: Warm,dry, no rash  Access: None    Basic Metabolic Panel: Recent Labs  Lab 08/27/24 0423 08/28/24 0515 08/29/24 0814 08/30/24 0439 08/31/24 0537 08/31/24 1009 09/01/24 0452 09/01/24 0839 09/01/24 1303 09/01/24 1902 09/02/24 0123 09/02/24 0556 09/02/24 1231  NA 124*   < > 126* 127* 119*   < > 118*   < > 120* 123* 121* 124* 123*  K 3.5   < > 3.9 3.8 3.9  --  3.9  --   --   --   --  3.8  --   CL 91*   < > 92* 94* 86*  --  88*  --   --   --   --  94*  --   CO2 23   < > 26 23 23   --  19*  --   --   --   --  19*  --   GLUCOSE 119*   < > 138* 104* 128*  --  116*  --   --   --   --  103*  --   BUN 14   < > 9 7* 6*  --  9  --   --   --   --  17  --   CREATININE 0.52*   < > 0.44* 0.43* 0.38*  --  0.37*  --   --   --   --  0.43*  --   CALCIUM  7.7*   < > 7.8* 7.8* 8.1*  --  7.8*  --   --   --   --  7.5*  --   MG 1.8  --   --   --   --   --   --   --   --   --   --  1.9  --   PHOS 2.8  --   --   --   --   --   --   --   --   --   --  2.5  --    < > = values in this interval not displayed.    Liver Function Tests: Recent Labs  Lab 08/27/24 1709  AST 84*  ALT 12  ALKPHOS 201*  BILITOT 0.4  PROT 6.9  ALBUMIN 3.0*   No results for input(s): LIPASE, AMYLASE in the last 168 hours. No results for input(s): AMMONIA in the last 168 hours.  CBC: Recent Labs  Lab 08/27/24 0423 08/27/24 1206 08/28/24 0515 08/29/24 9185 08/30/24 0439 08/31/24 0537 09/01/24 0452 09/02/24 0556 09/02/24 1231  WBC 12.0*   < > 15.0* 13.0* 12.5* 17.2* 17.8* 10.6*  --   NEUTROABS 9.6*  --  13.1*  --  9.5* 13.5* 15.0*  --   --   HGB 8.2*   < > 8.1* 8.9* 8.6* 9.0* 8.2* 7.1* 6.7*  HCT 25.0*   < > 24.5* 27.4* 25.6* 26.6* 23.8* 21.2*  --   MCV 100.0   < > 100.4* 100.0 99.2 97.4 95.6 97.2   --   PLT 192   < > 211 306 350 437* 363 315  --    < > = values in this interval not displayed.    Cardiac Enzymes: No results for input(s): CKTOTAL, CKMB, CKMBINDEX, TROPONINI in the last 168 hours.  BNP: Invalid input(s): POCBNP  CBG: Recent Labs  Lab 09/01/24 1227 09/01/24 1609 09/01/24 2303 09/02/24 0551 09/02/24 1208  GLUCAP 123* 126* 121* 99 131*    Microbiology: Results for orders placed or performed during the hospital encounter of 08/24/24  Blood culture (single)     Status: None   Collection Time: 08/24/24  2:07 PM   Specimen: BLOOD  Result Value Ref Range Status   Specimen Description BLOOD RIGHT ANTECUBITAL  Final   Special Requests   Final    BOTTLES DRAWN AEROBIC AND ANAEROBIC Blood Culture adequate volume   Culture   Final    NO GROWTH 5 DAYS Performed at [facility], [address], [place], [place] [address]    Report Status 08/29/2024 FINAL  Final  MRSA Next Gen by PCR, Nasal     Status: None   Collection Time: 08/25/24  4:45 PM   Specimen: Nasal Mucosa; Nasal Swab  Result Value Ref Range Status   MRSA by PCR Next Gen NOT DETECTED NOT DETECTED Final    Comment: (NOTE) The GeneXpert MRSA Assay (FDA approved for NASAL specimens only), is one component of a comprehensive MRSA colonization surveillance program. It is not intended to diagnose MRSA infection nor to guide or monitor treatment for MRSA infections. Test performance is not FDA approved in patients less than 45 years old. Performed at [facility], [address], [place], [place] [address]   MRSA Next Gen by PCR, Nasal     Status: None   Collection  Time: 08/31/24  3:02 PM   Specimen: Nasal Mucosa; Nasal Swab  Result Value Ref Range Status   MRSA by PCR Next Gen NOT DETECTED NOT DETECTED Final    Comment: (NOTE) The GeneXpert MRSA Assay (FDA approved for NASAL specimens only), is one component of a comprehensive MRSA colonization surveillance program. It  is not intended to diagnose MRSA infection nor to guide or monitor treatment for MRSA infections. Test performance is not FDA approved in patients less than 46 years old. Performed at [facility], [address] [place], [place] [address]   Respiratory (~20 pathogens) panel by PCR     Status: None   Collection Time: 09/01/24  1:23 PM   Specimen: Nasopharyngeal Swab; Respiratory  Result Value Ref Range Status   Adenovirus NOT DETECTED NOT DETECTED Final   Coronavirus 229E NOT DETECTED NOT DETECTED Final    Comment: (NOTE) The Coronavirus on the Respiratory Panel, DOES NOT test for the novel  Coronavirus (2019 nCoV)    Coronavirus HKU1 NOT DETECTED NOT DETECTED Final   Coronavirus NL63 NOT DETECTED NOT DETECTED Final   Coronavirus OC43 NOT DETECTED NOT DETECTED Final   Metapneumovirus NOT DETECTED NOT DETECTED Final   Rhinovirus / Enterovirus NOT DETECTED NOT DETECTED Final   Influenza A NOT DETECTED NOT DETECTED Final   Influenza B NOT DETECTED NOT DETECTED Final   Parainfluenza Virus 1 NOT DETECTED NOT DETECTED Final   Parainfluenza Virus 2 NOT DETECTED NOT DETECTED Final   Parainfluenza Virus 3 NOT DETECTED NOT DETECTED Final   Parainfluenza Virus 4 NOT DETECTED NOT DETECTED Final   Respiratory Syncytial Virus NOT DETECTED NOT DETECTED Final   Bordetella pertussis NOT DETECTED NOT DETECTED Final   Bordetella Parapertussis NOT DETECTED NOT DETECTED Final   Chlamydophila pneumoniae NOT DETECTED NOT DETECTED Final   Mycoplasma pneumoniae NOT DETECTED NOT DETECTED Final    Comment: Performed at [facility], [address] [place], [place] [address]    Coagulation Studies: No results for input(s): LABPROT, INR in the last 72 hours.  Urinalysis: No results for input(s): COLORURINE, LABSPEC, PHURINE, GLUCOSEU, HGBUR, BILIRUBINUR, KETONESUR, PROTEINUR, UROBILINOGEN, NITRITE, LEUKOCYTESUR in the last 72 hours.  Invalid input(s):  APPERANCEUR    Imaging: CT CHEST W CONTRAST Result Date: 09/01/2024 CLINICAL DATA:  Hyponatremia. EXAM: CT CHEST WITH CONTRAST TECHNIQUE: Multidetector CT imaging of the chest was performed during intravenous contrast administration. RADIATION DOSE REDUCTION: This exam was performed according to the departmental dose-optimization program which includes automated exposure control, adjustment of the mA and/or kV according to patient size and/or use of iterative reconstruction technique. CONTRAST:  75mL OMNIPAQUE  IOHEXOL  300 MG/ML  SOLN COMPARISON:  10/07/2022 FINDINGS: Cardiovascular: The heart size is upper normal to borderline enlarged. Coronary artery calcification is evident. Aortic valve calcification evident. Mitral valve calcification evident Mild atherosclerotic calcification is noted in the wall of the thoracic aorta. Mediastinum/Nodes: No mediastinal lymphadenopathy. There is no hilar lymphadenopathy. The esophagus has normal imaging features. There is no axillary lymphadenopathy. Lungs/Pleura: Fine architectural detail of the lung parenchyma is obscured by breathing motion. Patchy areas of ground-glass opacity are identified in both upper lobes with collapse/consolidative disease in the lower lobes bilaterally. No overt findings of pulmonary edema. Small to moderate relatively symmetric pleural effusions evident. Upper Abdomen: The stomach is distended with food and fluid. Musculoskeletal: No worrisome lytic or sclerotic osseous abnormality. IMPRESSION: 1. Patchy areas of ground-glass opacity in both upper lobes. Imaging features are compatible with multifocal pneumonia. 2. Bilateral lower  lobe dependent collapse/consolidation. Findings may be atelectatic although superimposed infection not excluded. 3. Small to moderate relatively symmetric bilateral pleural effusions. 4.  Aortic Atherosclerosis (ICD10-I70.0). Electronically Signed   By: Camellia Candle M.D.   On: 09/01/2024 05:49     Medications:     cefTRIAXone  (ROCEPHIN )  IV Stopped (09/01/24 2155)   sodium chloride  (hypertonic) 30 mL/hr at 09/02/24 1137   vancomycin  1,250 mg (09/02/24 1019)    sodium chloride    Intravenous Once   amiodarone   200 mg Oral Daily   [START ON 09/03/2024] amoxicillin -clavulanate  1 tablet Oral Q12H   apixaban   5 mg Oral BID   vitamin C   500 mg Oral BID   aspirin  EC  81 mg Oral Daily   atorvastatin   20 mg Oral Daily   carbidopa -levodopa   2 tablet Oral TID AC   Carbidopa -Levodopa  ER  1 tablet Oral QHS   Chlorhexidine  Gluconate Cloth  6 each Topical Q0600   docusate sodium   100 mg Oral BID   [START ON 09/03/2024] doxycycline   100 mg Oral Q12H   gabapentin   100 mg Oral BID   iron  polysaccharides  150 mg Oral Daily   multivitamin with minerals  1 tablet Oral Daily   nutrition supplement (JUVEN)  1 packet Oral BID BM   sodium bicarbonate   1,300 mg Oral BID   sodium chloride   1 g Oral BID WC   tamsulosin   0.4 mg Oral Daily   acetaminophen  **OR** acetaminophen , metoprolol  tartrate, ondansetron  **OR** ondansetron  (ZOFRAN ) IV, mouth rinse, oxyCODONE , polyethylene glycol  Assessment/ Plan:  Nicolas Solis is a 74 y.o.  male with past medical history of Parkinson's, tobacco and alcohol abuse. He presents to ED with left foot wounds, redness and swelling. Is currently admitted for Cellulitis of left lower extremity [L03.116] Critical limb ischemia of left lower extremity (HCC) [I70.222] Chronic ulcer of left foot, unspecified ulcer stage (HCC) [L97.529]   Acute hyponatremia, serum sodium 131 upon admission and down to 118 at time of initial evaluation Bacterial pneumonia. Acute metabolic acidosis with serum bicarbonate of 19.  Plan:  Serum sodium responded appropriately to 3% saline.  Patient also on sodium chloride  1 g twice daily.  Serum sodium was up to 124 earlier this a.m.  Continue 3% saline at 30 cc/h.  Target sodium by tomorrow is 130-132.  Continue treatment of bacterial pneumonia.  Patient  on ceftriaxone  and vancomycin .  Hemoglobin also low at 6.7.  Recommend blood transfusion but defer to primary team.  Patient also on sodium bicarb and 1300 mg twice daily to treat underlying acute metabolic acidosis.    LOS: 9 Earlyne Feeser 2/5/20263:38 PM   "

## 2024-09-02 NOTE — Progress Notes (Addendum)
 Hospitalist and Podiatrist have been notified: The pt's wife reported that the patient's left 4th toe and pinky toe look worse as far as color and perfusion goes as they look more purple then they were yesterday.    1400 PM: sodium is 123 and hemoglobin is 6.7 No bleeding has been noticed. Do you want to transfuse and hold Eliquis  at this time? The provider has ordered type and screen, transfusion and sodium tablets.   1448 PM: blood transfusion consent has been signed and placed in the pt's chart.   1644 PM: Per hospitalist central line might be removed.

## 2024-09-02 NOTE — Progress Notes (Addendum)
 Occupational Therapy Re-evaluatoin Patient Details Name: Nicolas Solis MRN: [id number] DOB: [birth year] Today's Date: 09/02/2024   History of present illness Pt is a 74 year old male presented to the ED with worsening pain, swelling, discoloration of L foot; admitted with SRIS from left foot ulercation with surrounding cellulitis, new onset afib with RVR; now s/p  LEFT SFA/Pop Stenting;TP trunk stenting 08/27/24         PMH significant coronary artery disease, Parkinson's disease, dementia with behavioral disturbance, transferred back to step down due to critical sodium   OT comments  Chart reviewed to date, re evaluation completed as pt was transferred to a higher level of care. Pt continues to present with deficits in strength, endurance, activity tolerance, balance, cognition, affecting safe and optimal ADL completion. MAX A +2 required for bed mobility, MAX A +2 for attempts at lateral scoot up the bed. MAX A required for donning post op shoe, MOD A For seated grooming tasks. Fair static sitting balance on edge of bed with heavy UE support required. Pt is performing ADL/functional mobility below PLOF, will benefit from acute OT to address functional deficits/facilitate optimal ADL/functional mobility engagement. Pt is left as received (offloaded with wedge under R hip), all needs met. OT will follow.       If plan is discharge home, recommend the following:  A lot of help with walking and/or transfers;A lot of help with bathing/dressing/bathroom;Supervision due to cognitive status   Equipment Recommendations  Other (comment) (defer to next venue of care)    Recommendations for Other Services      Precautions / Restrictions Precautions Precautions: Fall Recall of Precautions/Restrictions: Impaired Required Braces or Orthoses: Other Brace Other Brace: post op shoe Restrictions Weight Bearing Restrictions Per Provider Order: Yes LLE Weight Bearing Per Provider Order: Touchdown weight  bearing Other Position/Activity Restrictions: limited walking/transfers only per podiatry 1/31, heel weight bearing in post op shoe       Mobility Bed Mobility Overal bed mobility: Needs Assistance Bed Mobility: Supine to Sit, Sit to Supine     Supine to sit: Max assist, +2 for physical assistance, Used rails Sit to supine: Max assist, +2 for physical assistance, Used rails        Transfers     Transfers: Bed to chair/wheelchair/BSC            Lateral/Scoot Transfers: Max assist, +2 physical assistance General transfer comment: lateral scoot up the bed with MAX A +2     Balance Overall balance assessment: Needs assistance Sitting-balance support: Bilateral upper extremity supported, Feet unsupported Sitting balance-Leahy Scale: Fair Sitting balance - Comments: At least CGA for static sitting Postural control: Posterior lean                                 ADL either performed or assessed with clinical judgement   ADL Overall ADL's : Needs assistance/impaired     Grooming: Sitting;Moderate assistance Grooming Details (indicate cue type and reason): on edge of bed             Lower Body Dressing: Maximal assistance;Bed level Lower Body Dressing Details (indicate cue type and reason): donn/doff post op shoe                    Extremity/Trunk Assessment Upper Extremity Assessment Upper Extremity Assessment: Generalized weakness   Lower Extremity Assessment Lower Extremity Assessment: Generalized weakness   Cervical / Trunk Assessment  Cervical / Trunk Assessment: Normal    Vision Patient Visual Report: No change from baseline     Perception     Praxis     Communication Communication Communication: Impaired Factors Affecting Communication: Reduced clarity of speech   Cognition Arousal: Alert Behavior During Therapy: Anxious, Flat affect Cognition: Cognition impaired, History of cognitive impairments       Memory  impairment (select all impairments): Short-term memory (baseline)   Executive functioning impairment (select all impairments): Problem solving                   Following commands: Impaired Following commands impaired: Follows one step commands with increased time      Cueing   Cueing Techniques: Verbal cues, Tactile cues, Gestural cues  Exercises Other Exercises Other Exercises: edu re role of OT, role of rehab, importance of changing positions q2 hrs to prevent skin breakdown     Shoulder Instructions       General Comments      Pertinent Vitals/ Pain       Pain Assessment Pain Assessment: Faces Faces Pain Scale: Hurts little more Pain Location: L foot Pain Descriptors / Indicators: Discomfort, Grimacing Pain Intervention(s): Repositioned, Limited activity within patient's tolerance, Monitored during session  Home Living Family/patient expects to be discharged to:: Private residence Living Arrangements: Spouse/significant other Available Help at Discharge: Family;Available 24 hours/day Type of Home: House Home Access: Level entry     Home Layout: Able to live on main level with bedroom/bathroom     Bathroom Shower/Tub: Walk-in shower         Home Equipment: Agricultural Consultant (2 wheels);Rollator (4 wheels);Cane - single point;Shower seat;BSC/3in1          Prior Functioning/Environment              Frequency  Min 2X/week        Progress Toward Goals  OT Goals(current goals can now be found in the care plan section)     Acute Rehab OT Goals Patient Stated Goal: rehab Time For Goal Achievement: 09/16/24 Potential to Achieve Goals: Good  Plan      Co-evaluation    PT/OT/SLP Co-Evaluation/Treatment: Yes Reason for Co-Treatment: For patient/therapist safety;To address functional/ADL transfers PT goals addressed during session: Mobility/safety with mobility;Balance OT goals addressed during session: ADL's and self-care      AM-PAC OT  6 Clicks Daily Activity     Outcome Measure   Help from another person eating meals?: A Little Help from another person taking care of personal grooming?: A Little Help from another person toileting, which includes using toliet, bedpan, or urinal?: A Lot Help from another person bathing (including washing, rinsing, drying)?: A Lot Help from another person to put on and taking off regular upper body clothing?: A Little Help from another person to put on and taking off regular lower body clothing?: A Lot 6 Click Score: 15    End of Session    OT Visit Diagnosis: Other abnormalities of gait and mobility (R26.89);Muscle weakness (generalized) (M62.81)   Activity Tolerance Patient tolerated treatment well   Patient Left in bed;with call bell/phone within reach;with bed alarm set;with family/visitor present   Nurse Communication Mobility status        Time: 1002-1040 OT Time Calculation (min): 38 min  Charges: OT General Charges $OT Visit: 1 Visit OT Evaluation $OT Re-eval: 1 Re-eval OT Treatments $Therapeutic Activity: 8-22 mins Therisa Sheffield, OTD OTR/L  09/02/24, 12:44 PM

## 2024-09-02 NOTE — Plan of Care (Signed)

## 2024-09-02 NOTE — Evaluation (Addendum)
 Clinical/Bedside Swallow Evaluation Patient Details  Name: Nicolas Solis MRN: [id number] Date of Birth: 1950/11/26  Today's Date: 09/02/2024 Time: SLP Start Time (ACUTE ONLY): 1130 SLP Stop Time (ACUTE ONLY): 1155 SLP Time Calculation (min) (ACUTE ONLY): 25 min  Past Medical History:  Past Medical History:  Diagnosis Date   Compression fracture of first cervical vertebra (HCC)    Essential tremor    History of hip replacement    Ileus (HCC)    Past Surgical History:  Past Surgical History:  Procedure Laterality Date   25 GAUGE PARS PLANA VITRECTOMY WITH 20 GAUGE MVR PORT Right 03/18/2017   25 GAUGE PARS PLANA VITRECTOMY WITH 20 GAUGE MVR PORT FOR MACULAR HOLE Right 03/18/2017   Procedure: 25 GAUGE PARS PLANA VITRECTOMY WITH 20 GAUGE MVR PORT FOR MACULAR HOLE; PHOTOCOAGULATION RIGHT EYE;  Surgeon: Alvia Norleen BIRCH, MD;  Location: [facility] OR;  Service: Ophthalmology;  Laterality: Right;   APPENDECTOMY     COLONOSCOPY W/ POLYPECTOMY     GAS INSERTION Right 03/18/2017   Procedure: INSERTION OF GAS RIGHT (C3F8);  Surgeon: Alvia Norleen BIRCH, MD;  Location: [facility] OR;  Service: Ophthalmology;  Laterality: Right;   GAS/FLUID EXCHANGE Right 03/18/2017   Procedure: GAS/FLUID EXCHANGE RIGHT EYE;  Surgeon: Alvia Norleen BIRCH, MD;  Location: [facility] OR;  Service: Ophthalmology;  Laterality: Right;   JOINT REPLACEMENT Right    Hip   LEFT HEART CATH AND CORONARY ANGIOGRAPHY N/A 02/06/2022   Procedure: LEFT HEART CATH AND CORONARY ANGIOGRAPHY;  Surgeon: Darron Deatrice LABOR, MD;  Location: [facility] INVASIVE CV LAB;  Service: Cardiovascular;  Laterality: N/A;   LOWER EXTREMITY ANGIOGRAPHY Left 08/25/2024   Procedure: Lower Extremity Angiography;  Surgeon: Marea Selinda RAMAN, MD;  Location: [facility] INVASIVE CV LAB;  Service: Cardiovascular;  Laterality: Left;   LOWER EXTREMITY INTERVENTION Left 08/26/2024   Procedure: LOWER EXTREMITY INTERVENTION;  Surgeon: Marea Selinda RAMAN, MD;  Location: [facility] INVASIVE CV LAB;  Service: Cardiovascular;   Laterality: Left;   LOWER EXTREMITY INTERVENTION Left 08/27/2024   Procedure: LOWER EXTREMITY INTERVENTION;  Surgeon: Marea Selinda RAMAN, MD;  Location: [facility] INVASIVE CV LAB;  Service: Cardiovascular;  Laterality: Left;   MEMBRANE PEEL Right 03/18/2017   Procedure: MEMBRANE PEEL RIGHT EYE;  Surgeon: Alvia Norleen BIRCH, MD;  [facility] OR;  Service: Ophthalmology;  Laterality: Right;   MOUTH SURGERY     PHOTOCOAGULATION Left 03/18/2017   Procedure: PHOTOCOAGULATION LEFT EYE;  Surgeon: Alvia Norleen BIRCH, MD;  Location: [facility] OR;  Service: Ophthalmology;  Laterality: Left;   SERUM PATCH Right 03/18/2017   Procedure: SERUM PATCH RIGHT EYE;  Surgeon: Alvia Norleen BIRCH, MD;  Location: [facility] OR;  Service: Ophthalmology;  Laterality: Right;   HPI:  Pt is a 74 year old male presented to the ED with worsening pain, swelling, discoloration of L foot; admitted with SRIS from left foot ulercation with surrounding cellulitis, new onset afib with RVR; now s/p  LEFT SFA/Pop Stenting;TP trunk stenting 08/27/24         PMH significant for coronary artery disease, Parkinson's disease, dementia with behavioral disturbance  CT Chest 08/31/24:  Patchy areas of ground-glass opacity in both upper lobes. Imaging features are compatible with multifocal pneumonia. Bilateral lower lobe dependent collapse/consolidation. Findings may be atelectatic although superimposed infection not excluded.   Assessment / Plan / Recommendation  Clinical Impression  Pt seen for bedside swallow assessment in the setting of concern for aspiration PNA. Pt w/o history of dysphagia, remote history of dysarthria intervention in relation to  underlying Parkinson's diagnosis. Family/pt denied difficulty with swallowing or recurrent instance of PNA. Upon therapist entrance, pt on room air, with O2 saturations maintained 95 and greater. Pt with baseline congested, non productive cough. RN and spouse reporting coughing t/o day. Generalized oral weakness/deconditioning noted,  vocal intensity reduced, though suspect that is baseline.   Pt seen with trials of thin liquids (via straw) and regular solids. No overt or subtle s/sx pharyngeal dysphagia noted. No change to vocal quality across trials. Vitals stable for duration of trials. Oral phase grossly intact- with complete manipulation and clearance of regular solid from oral cavity.   Based baseline Parkinson's disease and acute pulmonary status and debility, pt is at increased risk of aspiration. Therefore, recommend aspiration precautions (slow rate, small bites, elevated HOB, and alert for PO intake). Education shared with spouse and pt regarding increased risk for aspiration and need for close monitoring for further testing. Continue with current unrestricted diet. SLP will monitor for continued safety with current diet. MD and RN aware of recommendations.   SLP Visit Diagnosis: Dysphagia, unspecified (R13.10) (related to acute deconditioning)    Aspiration Risk  Moderate aspiration risk    Diet Recommendation   Thin;Age appropriate regular  Medication Administration: Whole meds with liquid    Other Recommendations Oral Care Recommendations: Oral care BID (assist as needed)      Functional Status Assessment Patient has had a recent decline in their functional status and demonstrates the ability to make significant improvements in function in a reasonable and predictable amount of time.  Frequency and Duration min 2x/week  2 weeks       Prognosis Prognosis for improved oropharyngeal function: Good Barriers to Reach Goals:  (progressive nature of condition)      Swallow Study   General Date of Onset: 09/02/24 HPI: Pt is a 74 year old male presented to the ED with worsening pain, swelling, discoloration of L foot; admitted with SRIS from left foot ulercation with surrounding cellulitis, new onset afib with RVR; now s/p  LEFT SFA/Pop Stenting;TP trunk stenting 08/27/24         PMH significant for coronary  artery disease, Parkinson's disease, dementia with behavioral disturbance Type of Study: Bedside Swallow Evaluation Previous Swallow Assessment: none in chart Diet Prior to this Study: Regular;Thin liquids (Level 0) Temperature Spikes Noted: No (WBC 10.6 trending down) Respiratory Status: Room air History of Recent Intubation: No Behavior/Cognition: Alert;Cooperative Oral Cavity Assessment: Within Functional Limits Oral Care Completed by SLP: Recent completion by staff Oral Cavity - Dentition: Adequate natural dentition Vision: Functional for self-feeding Self-Feeding Abilities: Able to feed self Patient Positioning: Upright in bed Baseline Vocal Quality: Low vocal intensity (suspect baseline) Volitional Cough: Strong;Congested (non productive) Volitional Swallow: Able to elicit    Oral/Motor/Sensory Function Overall Oral Motor/Sensory Function: Generalized oral weakness   Ice Chips Ice chips: Not tested   Thin Liquid Thin Liquid: Within functional limits Presentation: Straw;Self Fed    Nectar Thick Nectar Thick Liquid: Not tested   Honey Thick Honey Thick Liquid: Not tested   Puree Puree: Not tested   Solid     Solid: Within functional limits Presentation: Self Fed     Toa Mia Clapp, MS, CCC-SLP Speech Language Pathologist Rehab Services; [facility] [place] [phone] (ascom)   Marine Lezotte J Clapp 09/02/2024,12:37 PM

## 2024-09-02 NOTE — Progress Notes (Signed)
 " Progress Note   Patient: Nicolas Solis FMW:[id number] DOB: Dec 05, 1950 DOA: 08/24/2024     9 DOS: the patient was seen and examined on 09/02/2024   Brief hospital course: From HPI Nicolas Solis is a 74 y.o. male with medical history significant of coronary artery disease, Parkinson's disease presenting to the emergency department for evaluation of worsening pain, swelling, and discoloration of his left foot. He saw Podiatry about two weeks ago and was referred to Vascular Surgery but has not yet seen them. He was in for follow up with PCP today and was referred to the ED given concern for acute limb ischemia.  Patient was placed on heparin ,s/p LEFT SFA/Pop Stenting;TP trunk stenting 1/30.  Patient is also followed by podiatry. Patient had worsening hyponatremia on 2/3, started on 3% sodium chloride .    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  This is thought to be secondary to SIADH.  Appreciate nephrology consult.  Continue monitor sodium closely.  Patient also placed on fluid restriction. Sodium level is better, but still low, added salt tablets in addition to 3% sodium chloride .  Continue to monitor.    Atherosclerosis obliterans with ulceration  Gangrene involving left 4th and 5th toes On heparin  drip for the last 3 days, changed to Eliquis  as heparin  drip has made sodium level much lower with D5 as carrier fluids.  Vascular surgery took patient for angiography on 08/27/2024 Patient has been followed by podiatry.     SIRS Left foot ulceration with surrounding cellulitis  - leukocytosis and  tachycardia, otherwise hemodynamically stable Continue current antibiotics Cultures showed no growth to date Patient so far has completed 10 days of antibiotics, will change to Augmentin  and doxycycline  for additional 4 days.   New onset A-fib with RVR Patient on heparin  drip Amiodarone  drip have been switched to oral since patient is seen sinus rhythm Echo showing EF 65% with no shunt On Eliquis .   Acute on chronic macrocytic anemia  Appears to be anemia of chronic disease, iron  level is low, ferritin level elevated.  But I added this oral iron .  B12 level elevated. Hemoglobin dropped down to 6.7 today, no black stool or rectal bleeding noted.  Will continue to monitor closely, transfuse 1 unit PRBC.   Multifocal pneumonia. Patient had a CT scan checking for malignancy of the chest, came back with multifocal pneumonia.  Viral panel negative, procalcitonin level 0.24.  Currently on Rocephin  and vancomycin .  Change antibiotics to Augmentin  and doxycycline .  Possible likely this is aspiration pneumonitis.   Metabolic acidosis. Start sodium bicarb.   Parkinson's disease  Dementia with behavioral disturbance Patient mental status improved.  No longer has agitation   CAD HLD Continue home statin therapy       Subjective:  Patient feels better today, denies any short of breath.  Physical Exam: Vitals:   09/02/24 1300 09/02/24 1315 09/02/24 1330 09/02/24 1345  BP: 127/64     Pulse: 83 80 77 78  Resp: (!) 22 (!) 27 (!) 25 (!) 26  Temp:      TempSrc:      SpO2: 92%  95% 93% 94%  Weight:      Height:       General exam: Appears calm and comfortable  Respiratory system: Clear to auscultation. Respiratory effort normal. Cardiovascular system: S1 & S2 heard, RRR. No JVD, murmurs, rubs, gallops or clicks. No pedal edema. Gastrointestinal system: Abdomen is nondistended, soft and nontender. No organomegaly or masses felt. Normal bowel sounds heard. Central nervous system: Alert  and oriented x2. No focal neurological deficits. Extremities: Symmetric 5 x 5 power. Skin: No rashes, lesions or ulcers Psychiatry: Judgement and insight appear normal. Mood & affect appropriate.    Data Reviewed:  Lab results reviewed.  Family Communication: Wife updated at the bedside.  Disposition: Status is: Inpatient Remains inpatient appropriate because: Severity of disease, IV treatment     Time spent: 50 minutes  Author: Murvin Mana, MD 09/02/2024 2:08 PM  For on call review [email or web address].    "

## 2024-09-03 ENCOUNTER — Inpatient Hospital Stay

## 2024-09-03 LAB — TYPE AND SCREEN
ABO/RH(D): O POS
Antibody Screen: NEGATIVE
Unit division: 0

## 2024-09-03 LAB — CBC
HCT: 25.7 % — ABNORMAL LOW (ref 39.0–52.0)
Hemoglobin: 8.6 g/dL — ABNORMAL LOW (ref 13.0–17.0)
MCH: 32.3 pg (ref 26.0–34.0)
MCHC: 33.5 g/dL (ref 30.0–36.0)
MCV: 96.6 fL (ref 80.0–100.0)
Platelets: 330 10*3/uL (ref 150–400)
RBC: 2.66 MIL/uL — ABNORMAL LOW (ref 4.22–5.81)
RDW: 14.4 % (ref 11.5–15.5)
WBC: 10 10*3/uL (ref 4.0–10.5)
nRBC: 0 % (ref 0.0–0.2)

## 2024-09-03 LAB — GLUCOSE, CAPILLARY
Glucose-Capillary: 106 mg/dL — ABNORMAL HIGH (ref 70–99)
Glucose-Capillary: 112 mg/dL — ABNORMAL HIGH (ref 70–99)
Glucose-Capillary: 120 mg/dL — ABNORMAL HIGH (ref 70–99)
Glucose-Capillary: 122 mg/dL — ABNORMAL HIGH (ref 70–99)

## 2024-09-03 LAB — BPAM RBC
Blood Product Expiration Date: 202603092359
ISSUE DATE / TIME: 202602051626
Unit Type and Rh: 5100

## 2024-09-03 LAB — SODIUM
Sodium: 127 mmol/L — ABNORMAL LOW (ref 135–145)
Sodium: 126 mmol/L — ABNORMAL LOW (ref 135–145)
Sodium: 129 mmol/L — ABNORMAL LOW (ref 135–145)

## 2024-09-03 LAB — BASIC METABOLIC PANEL WITH GFR
Anion gap: 11 (ref 5–15)
BUN: 17 mg/dL (ref 8–23)
CO2: 21 mmol/L — ABNORMAL LOW (ref 22–32)
Calcium: 7.6 mg/dL — ABNORMAL LOW (ref 8.9–10.3)
Chloride: 95 mmol/L — ABNORMAL LOW (ref 98–111)
Creatinine, Ser: 0.44 mg/dL — ABNORMAL LOW (ref 0.61–1.24)
GFR, Estimated: >60 mL/min
Glucose, Bld: 110 mg/dL — ABNORMAL HIGH (ref 70–99)
Potassium: 3.7 mmol/L (ref 3.5–5.1)
Sodium: 127 mmol/L — ABNORMAL LOW (ref 135–145)

## 2024-09-03 LAB — MAGNESIUM: Magnesium: 1.7 mg/dL (ref 1.7–2.4)

## 2024-09-03 MED ORDER — IPRATROPIUM-ALBUTEROL 0.5-2.5 (3) MG/3ML IN SOLN
3.0000 mL | Freq: Once | RESPIRATORY_TRACT | Status: AC
  Administered 2024-09-03: 3 mL via RESPIRATORY_TRACT
  Filled 2024-09-03: qty 3

## 2024-09-03 MED ORDER — FUROSEMIDE 10 MG/ML IJ SOLN
40.0000 mg | Freq: Once | INTRAMUSCULAR | Status: AC
  Administered 2024-09-03: 40 mg via INTRAVENOUS
  Filled 2024-09-03: qty 4

## 2024-09-03 NOTE — Progress Notes (Signed)
 SPIRITUAL CARE AND COUNSELING CONSULT NOTE   VISIT SUMMARY Follow-up ICU visit   SPIRITUAL ENCOUNTER                                                                                                                                                                      Type of Visit: Follow up Care provided to:: Pt and family Conversation partners present during encounter: Other (comment) Building Surveyor) Reason for visit: Routine spiritual support OnCall Visit: No   SPIRITUAL FRAMEWORK  Presenting Themes: Courage hope and growth, Caregiving needs, Community and relationships Values/beliefs: faith, family, faith community Community/Connection: Family, Friend(s), Faith community, Designer, multimedia Strengths: great support system Needs/Challenges/Barriers: pain, Parkinsons Patient Stress Factors: Health changes Family Stress Factors: Health changes, Financial concerns, Lack of knowledge   GOALS   Self/Personal Goals: Move from ICU to regular room Clinical Care Goals: Address pain   INTERVENTIONS   Spiritual Care Interventions Made: Compassionate presence, Reflective listening, Established relationship of care and support, Prayer    INTERVENTION OUTCOMES   Outcomes: Connection to spiritual care, Awareness of support, Patient family open to resources  SPIRITUAL CARE PLAN   Spiritual Care Issues Still Outstanding: Elia will continue to follow    If immediate needs arise, please contact [facility] 24 hour on call [phone]   Barnie JINNY Record, Chaplain  09/03/2024 1:38 PM

## 2024-09-03 NOTE — Progress Notes (Signed)
"  ° °      Overnight   NAME: Nicolas Solis MRN: [id number] DOB : 1950/09/17    Date of Service   09/03/2024   HPI/Events of Note   HPI:  74 y.o. male with medical history significant of coronary artery disease, Parkinson's disease presenting to the emergency department for evaluation of worsening pain, swelling, and discoloration of his left foot. He saw Podiatry about two weeks ago and was referred to Vascular Surgery but has not yet seen them. He was in for follow up with PCP today and was referred to the ED given concern for acute limb ischemia.  Patient was placed on heparin ,s/p LEFT SFA/Pop Stenting;TP trunk stenting 1/30.  Patient is also followed by podiatry. Patient had worsening hyponatremia on 2/3, started on 3% sodium chloride .      Overnight: Notified by RN of worsening work of breathing, new adventitious breath sounds.  Chest x-ray and DuoNeb ordered at this time.  Physical Exam Vitals reviewed.  Eyes:     Pupils: Pupils are equal, round, and reactive to light.  Cardiovascular:     Rate and Rhythm: Regular rhythm. Tachycardia present.     Pulses:          Radial pulses are 2+ on the right side and 2+ on the left side.     Heart sounds: S1 normal and S2 normal.  Pulmonary:     Effort: Tachypnea and respiratory distress present.     Breath sounds: Decreased air movement present. Examination of the right-upper field reveals wheezing and rhonchi. Examination of the left-upper field reveals wheezing and rhonchi. Examination of the right-middle field reveals rhonchi. Examination of the left-middle field reveals rhonchi. Examination of the right-lower field reveals rhonchi. Examination of the left-lower field reveals rhonchi. Wheezing and rhonchi present.  Abdominal:     General: Abdomen is flat. Bowel sounds are normal.     Palpations: Abdomen is soft.  Musculoskeletal:     Right lower leg: No edema.     Left lower leg: No edema.  Skin:    General: Skin is warm and dry.      Capillary Refill: Capillary refill takes 2 to 3 seconds.  Neurological:     General: No focal deficit present.     Mental Status: He is alert and oriented to person, place, and time.     GCS: GCS eye subscore is 4. GCS verbal subscore is 5. GCS motor subscore is 6.       Interventions/ Plan   Cest xray: IMPRESSION: Pulmonary edema with bilateral pleural effusions similar to prior. 40 Mg Iv lasix  ordered Plan to monitor Sodium and UOP closely     Updates     Laneta Almarie Peter BSN RN CCRN AGACNP-BC Acute Care Nurse Practitioner [facility] [place]  "

## 2024-09-03 NOTE — Progress Notes (Signed)
 PODIATRY / FOOT AND ANKLE SURGERY PROGRESS NOTE   HPI:  Nicolas Solis is a 74 y.o. male who presents today sitting up in bed.   Patient reports he is doing fairly well, pain is much better to the LLE. Breathing is better but still with cough.  Complains of left foot pain however has improved since outpatient.   PMHx:  Past Medical History:  Diagnosis Date   Compression fracture of first cervical vertebra (HCC)    Essential tremor    History of hip replacement    Ileus (HCC)     Surgical Hx:  Past Surgical History:  Procedure Laterality Date   25 GAUGE PARS PLANA VITRECTOMY WITH 20 GAUGE MVR PORT Right 03/18/2017   25 GAUGE PARS PLANA VITRECTOMY WITH 20 GAUGE MVR PORT FOR MACULAR HOLE Right 03/18/2017   Procedure: 25 GAUGE PARS PLANA VITRECTOMY WITH 20 GAUGE MVR PORT FOR MACULAR HOLE; PHOTOCOAGULATION RIGHT EYE;  Surgeon: Alvia Norleen BIRCH, MD;  Location: [facility] OR;  Service: Ophthalmology;  Laterality: Right;   APPENDECTOMY     COLONOSCOPY W/ POLYPECTOMY     GAS INSERTION Right 03/18/2017   Procedure: INSERTION OF GAS RIGHT (C3F8);  Surgeon: Alvia Norleen BIRCH, MD;  Location: [facility] OR;  Service: Ophthalmology;  Laterality: Right;   GAS/FLUID EXCHANGE Right 03/18/2017   Procedure: GAS/FLUID EXCHANGE RIGHT EYE;  Surgeon: Alvia Norleen BIRCH, MD;  Location: [facility] OR;  Service: Ophthalmology;  Laterality: Right;   JOINT REPLACEMENT Right    Hip   LEFT HEART CATH AND CORONARY ANGIOGRAPHY N/A 02/06/2022   Procedure: LEFT HEART CATH AND CORONARY ANGIOGRAPHY;  Surgeon: Darron Deatrice LABOR, MD;  Location: [facility] INVASIVE CV LAB;  Service: Cardiovascular;  Laterality: N/A;   LOWER EXTREMITY ANGIOGRAPHY Left 08/25/2024   Procedure: Lower Extremity Angiography;  Surgeon: Marea Selinda RAMAN, MD;  Location: [facility] INVASIVE CV LAB;  Service: Cardiovascular;  Laterality: Left;   LOWER EXTREMITY INTERVENTION Left 08/26/2024   Procedure: LOWER EXTREMITY INTERVENTION;  Surgeon: Marea Selinda RAMAN, MD;  Location: [facility] INVASIVE CV LAB;   Service: Cardiovascular;  Laterality: Left;   LOWER EXTREMITY INTERVENTION Left 08/27/2024   Procedure: LOWER EXTREMITY INTERVENTION;  Surgeon: Marea Selinda RAMAN, MD;  Location: [facility] INVASIVE CV LAB;  Service: Cardiovascular;  Laterality: Left;   MEMBRANE PEEL Right 03/18/2017   Procedure: MEMBRANE PEEL RIGHT EYE;  Surgeon: Alvia Norleen BIRCH, MD;  Location: [facility] OR;  Service: Ophthalmology;  Laterality: Right;   MOUTH SURGERY     PHOTOCOAGULATION Left 03/18/2017   Procedure: PHOTOCOAGULATION LEFT EYE;  Surgeon: Alvia Norleen BIRCH, MD;  Location: [facility] OR;  Service: Ophthalmology;  Laterality: Left;   SERUM PATCH Right 03/18/2017   Procedure: SERUM PATCH RIGHT EYE;  Surgeon: Alvia Norleen BIRCH, MD;  Location: [facility] OR;  Service: Ophthalmology;  Laterality: Right;    FHx:  Family History  Problem Relation Age of Onset   Stroke Brother     Social History:  reports that he quit smoking about 8 years ago. His smoking use included cigarettes. He started smoking about 58 years ago. He has been exposed to tobacco smoke. He has never used smokeless tobacco. He reports current alcohol use of about 7.0 standard drinks of alcohol per week. He reports that he does not use drugs.  Allergies: Allergies[1]  Medications Prior to Admission  Medication Sig Dispense Refill   acetaminophen  (TYLENOL ) 500 MG tablet Take 1,000 mg by mouth every 6 (six) hours as needed (for pain.).     aspirin  EC 81 MG  tablet Take 1 tablet (81 mg total) by mouth daily. Swallow whole. 90 tablet 3   atorvastatin  (LIPITOR) 20 MG tablet Take 1 tablet (20 mg total) by mouth daily. 90 tablet 3   carbidopa -levodopa  (SINEMET  IR) 25-100 MG tablet Take 2 tablets by mouth 3 (three) times daily before meals.     Carbidopa -Levodopa  ER (SINEMET  CR) 25-100 MG tablet controlled release Take 1 tablet by mouth at bedtime.     Cholecalciferol  (VITAMIN D3) 50 MCG (2000 UT) TABS Take 2,000 Units by mouth in the morning.     Cyanocobalamin  (VITAMIN B-12) 5000 MCG LOZG  Take 2,500 mcg by mouth every evening.     gabapentin  (NEURONTIN ) 100 MG capsule Take 100 mg by mouth 2 (two) times daily.     midodrine  (PROAMATINE ) 2.5 MG tablet Take 1 tablet (2.5 mg total) by mouth 3 (three) times daily with meals. 270 tablet 2   Multiple Vitamins-Minerals (ADULT ONE DAILY GUMMIES PO) Take 2 tablets by mouth every evening.     Omega-3 Fatty Acids (FISH OIL PO) Take 1,000 mg by mouth in the morning.     ranolazine  (RANEXA ) 500 MG 12 hr tablet TAKE ONE TABLET BY MOUTH TWICE DAILY 180 tablet 3   tamsulosin  (FLOMAX ) 0.4 MG CAPS capsule Take 0.4 mg by mouth daily after supper.     doxycycline  (VIBRAMYCIN ) 100 MG capsule Take 100 mg by mouth 2 (two) times daily.      Physical Exam: General: Alert and oriented.  No apparent distress.  Vascular:  - Capillary fill time delayed to left forefoot - Pedal hair absent BLE - Overall improved discoloration to the global foot - left, L 5th toe appearing to stabilize to drier gangrenous state ; the L 4th with dusky appearance  Neuro: Light touch sensation intact bilateral lower extremities.  Derm:  - Dry gangrene to the dorsal 5th MPJ extending laterally to the plantar 5th and 4th MPJ.  -  L 5th toe appearing to stabilize to drier gangrenous state ; the L 4th with dusky appearance - Overall improved edema to the global foot - left  - Medial heel fissure with stabilization occurring - eschar forming 2/6  MSK:  - Pes cavus foot type  - Pain on palpation left foot - decreased from 2/4 exam                Results for orders placed or performed during the hospital encounter of 08/24/24 (from the past 48 hours)  Sodium     Status: Abnormal   Collection Time: 09/01/24  7:02 PM  Result Value Ref Range   Sodium 123 (L) 135 - 145 mmol/L    Comment: Performed at [facility], [address] [place], [place] [address]  Glucose, capillary     Status: Abnormal   Collection Time: 09/01/24 11:03 PM  Result Value Ref  Range   Glucose-Capillary 121 (H) 70 - 99 mg/dL    Comment: Glucose reference range applies only to samples taken after fasting for at least 8 hours.  Sodium     Status: Abnormal   Collection Time: 09/02/24  1:23 AM  Result Value Ref Range   Sodium 121 (L) 135 - 145 mmol/L    Comment: Performed at [facility], [address] [place], [place] [address]  Glucose, capillary     Status: None   Collection Time: 09/02/24  5:51 AM  Result Value Ref Range   Glucose-Capillary 99 70 - 99 mg/dL  Comment: Glucose reference range applies only to samples taken after fasting for at least 8 hours.  CBC     Status: Abnormal   Collection Time: 09/02/24  5:56 AM  Result Value Ref Range   WBC 10.6 (H) 4.0 - 10.5 K/uL   RBC 2.18 (L) 4.22 - 5.81 MIL/uL   Hemoglobin 7.1 (L) 13.0 - 17.0 g/dL   HCT 78.7 (L) 60.9 - 47.9 %   MCV 97.2 80.0 - 100.0 fL   MCH 32.6 26.0 - 34.0 pg   MCHC 33.5 30.0 - 36.0 g/dL   RDW 86.3 88.4 - 84.4 %   Platelets 315 150 - 400 K/uL   nRBC 0.0 0.0 - 0.2 %    Comment: Performed at [facility], [address] [place], [place] [address]  Basic metabolic panel with GFR     Status: Abnormal   Collection Time: 09/02/24  5:56 AM  Result Value Ref Range   Sodium 124 (L) 135 - 145 mmol/L   Potassium 3.8 3.5 - 5.1 mmol/L   Chloride 94 (L) 98 - 111 mmol/L   CO2 19 (L) 22 - 32 mmol/L   Glucose, Bld 103 (H) 70 - 99 mg/dL    Comment: Glucose reference range applies only to samples taken after fasting for at least 8 hours.   BUN 17 8 - 23 mg/dL   Creatinine, Ser 9.56 (L) 0.61 - 1.24 mg/dL   Calcium  7.5 (L) 8.9 - 10.3 mg/dL   GFR, Estimated >39 >39 mL/min    Comment: (NOTE) Calculated using the CKD-EPI Creatinine Equation (2021)    Anion gap 10 5 - 15    Comment: Performed at [facility], [address] [place], [place] [address]  Magnesium      Status: None   Collection Time: 09/02/24  5:56 AM  Result Value Ref Range   Magnesium  1.9 1.7 - 2.4 mg/dL     Comment: Performed at [facility], [address], [place], [place] [address]  Phosphorus     Status: None   Collection Time: 09/02/24  5:56 AM  Result Value Ref Range   Phosphorus 2.5 2.5 - 4.6 mg/dL    Comment: Performed at [facility] [address] [place], [place] [address]  Glucose, capillary     Status: Abnormal   Collection Time: 09/02/24 12:08 PM  Result Value Ref Range   Glucose-Capillary 131 (H) 70 - 99 mg/dL    Comment: Glucose reference range applies only to samples taken after fasting for at least 8 hours.  Sodium     Status: Abnormal   Collection Time: 09/02/24 12:31 PM  Result Value Ref Range   Sodium 123 (L) 135 - 145 mmol/L    Comment: Performed at [facility], [address] [place], [place] [address]  Hemoglobin     Status: Abnormal   Collection Time: 09/02/24 12:31 PM  Result Value Ref Range   Hemoglobin 6.7 (L) 13.0 - 17.0 g/dL    Comment: Performed at [facility], [address], [place], [place] [address]  ABO/Rh     Status: None   Collection Time: 09/02/24 12:31 PM  Result Value Ref Range   ABO/RH(D)      O POS Performed at [facility], [address] [place], [place] [address]   Type and screen [facility]     Status: None   Collection Time: 09/02/24  2:23 PM  Result Value Ref Range   ABO/RH(D)  O POS    Antibody Screen NEG    Sample Expiration 09/05/2024,2359    Unit Number [id number]    Blood Component Type RED CELLS,LR    Unit division 00    Status of Unit ISSUED,FINAL    Transfusion Status OK TO TRANSFUSE    Crossmatch Result      Compatible Performed at [facility], [address], [place], [place] [address]   Prepare RBC (crossmatch)     Status: None   Collection Time: 09/02/24  2:30 PM  Result Value Ref Range   Order Confirmation      ORDER PROCESSED BY BLOOD BANK Performed at [facility], [address] [place], [place]  [address]   Glucose, capillary     Status: Abnormal   Collection Time: 09/02/24  5:19 PM  Result Value Ref Range   Glucose-Capillary 111 (H) 70 - 99 mg/dL    Comment: Glucose reference range applies only to samples taken after fasting for at least 8 hours.  Sodium     Status: Abnormal   Collection Time: 09/02/24  6:48 PM  Result Value Ref Range   Sodium 127 (L) 135 - 145 mmol/L    Comment: Performed at [facility], [address] [place], [place] [address]  Glucose, capillary     Status: Abnormal   Collection Time: 09/02/24 11:23 PM  Result Value Ref Range   Glucose-Capillary 126 (H) 70 - 99 mg/dL    Comment: Glucose reference range applies only to samples taken after fasting for at least 8 hours.  Sodium     Status: Abnormal   Collection Time: 09/03/24  1:03 AM  Result Value Ref Range   Sodium 127 (L) 135 - 145 mmol/L    Comment: Performed at [facility], [address] [place], [place] [address]  CBC     Status: Abnormal   Collection Time: 09/03/24  5:12 AM  Result Value Ref Range   WBC 10.0 4.0 - 10.5 K/uL   RBC 2.66 (L) 4.22 - 5.81 MIL/uL   Hemoglobin 8.6 (L) 13.0 - 17.0 g/dL    Comment: REPEATED TO VERIFY   HCT 25.7 (L) 39.0 - 52.0 %   MCV 96.6 80.0 - 100.0 fL   MCH 32.3 26.0 - 34.0 pg   MCHC 33.5 30.0 - 36.0 g/dL   RDW 85.5 88.4 - 84.4 %   Platelets 330 150 - 400 K/uL   nRBC 0.0 0.0 - 0.2 %    Comment: Performed at [facility], [address] [place], [place] [address]  Basic metabolic panel with GFR     Status: Abnormal   Collection Time: 09/03/24  5:12 AM  Result Value Ref Range   Sodium 127 (L) 135 - 145 mmol/L   Potassium 3.7 3.5 - 5.1 mmol/L   Chloride 95 (L) 98 - 111 mmol/L   CO2 21 (L) 22 - 32 mmol/L   Glucose, Bld 110 (H) 70 - 99 mg/dL    Comment: Glucose reference range applies only to samples taken after fasting for at least 8 hours.   BUN 17 8 - 23 mg/dL   Creatinine, Ser 9.55 (L) 0.61 - 1.24 mg/dL   Calcium  7.6 (L) 8.9  - 10.3 mg/dL   GFR, Estimated >39 >39 mL/min    Comment: (NOTE) Calculated using the CKD-EPI Creatinine Equation (2021)    Anion gap 11 5 - 15    Comment: Performed at [facility], [address]  [place], [place] [address]  Magnesium      Status: None   Collection Time: 09/03/24  5:12 AM  Result Value Ref Range   Magnesium  1.7 1.7 - 2.4 mg/dL    Comment: Performed at [facility], [address] [place], [place] [address]  Glucose, capillary     Status: Abnormal   Collection Time: 09/03/24  6:27 AM  Result Value Ref Range   Glucose-Capillary 106 (H) 70 - 99 mg/dL    Comment: Glucose reference range applies only to samples taken after fasting for at least 8 hours.  Glucose, capillary     Status: Abnormal   Collection Time: 09/03/24 11:25 AM  Result Value Ref Range   Glucose-Capillary 112 (H) 70 - 99 mg/dL    Comment: Glucose reference range applies only to samples taken after fasting for at least 8 hours.  Sodium     Status: Abnormal   Collection Time: 09/03/24  1:01 PM  Result Value Ref Range   Sodium 126 (L) 135 - 145 mmol/L    Comment: Performed at [facility], [address] [place], [place] [address]   DG CHEST PORT 1 VIEW Result Date: 09/03/2024 EXAM: 1 VIEW XRAY OF THE CHEST 09/03/2024 01:33:18 AM COMPARISON: Comparison with 08/26/2024. CLINICAL HISTORY: Rhonchi. FINDINGS: LUNGS AND PLEURA: Diffuse interstitial coarsening similar to prior. Layering bilateral pleural effusions. Basilar airspace opacities. No pneumothorax. HEART AND MEDIASTINUM: Stable cardiomediastinal silhouette. Aortic atherosclerotic calcification. BONES AND SOFT TISSUES: No acute osseous abnormality. IMPRESSION: 1. Pulmonary edema with bilateral pleural effusions similar to prior. Electronically signed by: Norman Gatlin MD 09/03/2024 01:36 AM EST RP Workstation: [id number]     Blood pressure 121/71, pulse 82, temperature 98.3 F (36.8 C), temperature source Axillary, resp.  rate (!) 26, height 6' (1.829 m), weight 78.1 kg, SpO2 97%.  Assessment Critical limb ischemia left lower extremity -gangrene left lower extremity PVD Left foot gangrene   Currently patient in ICU secondary to hyponatremia.   Patient - systemically - appears to be improving in comparison to prior examination 09/01/2024. From podiatry standpoint, unless the foot is creating systemic challenges or becomes infected/unstable, podiatry recommends awaiting demarcation to see level of appropriate amputation if this is required.  Wife and patient voiced their understanding.  HBOT referral would be greatly appreciated if patient is considered a candidate, as this may be beneficial to local healing measures. Discussed with vascular team who feels as if patient has been optimized currently, and flow has improved significantly from initial presentation of hospitalization.    Plan - Recommend continuation of local wound care - staying AWAY from any moisture to foot. Please do not cleanse the necrosing area for routine measures - solely betadine paint. Betadine paint to all wounds (L medial heel, L lateral ankle, L plantar/dorsal foot) with dry gauze to 3rd and 4th IDS + overlying Kerlix and tape.  Does not need to be changed until tomorrow. - Demarcation may continue for days to a couple of weeks and can be addressed in outpatient setting if patient is systemically improved enough for discharge, until that point - demarcation will continue to be monitored intermittently by podiatry during hospital stay.  - Appreciate medicine recommendations for antibiotic and pain management. - Appreciate vascular recommendations. - Recommend HBOT initiation / referral.    Greig KANDICE Blush, DPM 09/03/2024, 5:30 PM           [1] No Known Allergies

## 2024-09-03 NOTE — Progress Notes (Signed)
 " [facility]  ROUNDING NOTE   Subjective:   Nicolas Solis is a 74 y.o. male with past medical history of Parkinson's, tobacco and alcohol abuse. He presents to ED with left foot wounds, redness and swelling. Is currently admitted for Cellulitis of left lower extremity [L03.116] Critical limb ischemia of left lower extremity (HCC) [I70.222] Chronic ulcer of left foot, unspecified ulcer stage [facility]) [L97.529]  Patient is known to our practice from previous admissions.  States he began having worsening left foot pain over the past week.  He started having chills with no known fever 2-3 nights prior to ED arrival.  Patient does endorse tobacco use, states he quit smoking 20 to 30 years ago.  Reports appetite has remained appropriate.  Denies nausea or vomiting.  Sodium on ED arrival 131 and has decreased to 119 at time of evaluation.  TSH acceptable.  Urine osmolality acceptable.  CT left foot negative for osteomyelitis, soft tissue swelling noted.  Chest x-ray shows mild diffuse interstitial edema.  Update:  At the time of evaluation this a.m. serum sodium was found to be 127. Wife at bedside. We decided to increase 3% saline to 40 cc/h.   Objective:  Vital signs in last 24 hours:  Temp:  [98.1 F (36.7 C)-100.2 F (37.9 C)] 98.3 F (36.8 C) (02/06 0800) Pulse Rate:  [65-93] 85 (02/06 1800) Resp:  [16-29] 27 (02/06 1800) BP: (98-160)/(53-82) 142/82 (02/06 1800) SpO2:  [91 %-100 %] 92 % (02/06 1800) Weight:  [78.1 kg] 78.1 kg (02/06 0500)  Weight change: 1.1 kg Filed Weights   09/01/24 0500 09/02/24 0420 09/03/24 0500  Weight: 77 kg 77 kg 78.1 kg    Intake/Output: I/O last 3 completed shifts: In: 4365.9 [P.O.:2000; I.V.:1213.9; Blood:552; IV Piggyback:600] Out: 4925 [Urine:4925]   Intake/Output this shift:  Total I/O In: -  Out: 175 [Urine:175]  Physical Exam: General: NAD  Head: Normocephalic, atraumatic. Moist oral mucosal membranes  Eyes: Anicteric   Lungs:  Scattered rhonchi  Heart: Regular rate and rhythm  Abdomen:  Soft, nontender  Extremities: No peripheral edema.  Left foot gauze dressing  Neurologic: Awake, alert, conversant  Skin: Warm,dry, no rash  Access: None    Basic Metabolic Panel: Recent Labs  Lab 08/30/24 0439 08/31/24 0537 08/31/24 1009 09/01/24 0452 09/01/24 0839 09/02/24 0556 09/02/24 1231 09/02/24 1848 09/03/24 0103 09/03/24 0512 09/03/24 1301 09/03/24 1822  NA 127* 119*   < > 118*   < > 124*   < > 127* 127* 127* 126* 129*  K 3.8 3.9  --  3.9  --  3.8  --   --   --  3.7  --   --   CL 94* 86*  --  88*  --  94*  --   --   --  95*  --   --   CO2 23 23  --  19*  --  19*  --   --   --  21*  --   --   GLUCOSE 104* 128*  --  116*  --  103*  --   --   --  110*  --   --   BUN 7* 6*  --  9  --  17  --   --   --  17  --   --   CREATININE 0.43* 0.38*  --  0.37*  --  0.43*  --   --   --  0.44*  --   --  CALCIUM  7.8* 8.1*  --  7.8*  --  7.5*  --   --   --  7.6*  --   --   MG  --   --   --   --   --  1.9  --   --   --  1.7  --   --   PHOS  --   --   --   --   --  2.5  --   --   --   --   --   --    < > = values in this interval not displayed.    Liver Function Tests: No results for input(s): AST, ALT, ALKPHOS, BILITOT, PROT, ALBUMIN in the last 168 hours.  No results for input(s): LIPASE, AMYLASE in the last 168 hours. No results for input(s): AMMONIA in the last 168 hours.  CBC: Recent Labs  Lab 08/28/24 0515 08/29/24 0814 08/30/24 0439 08/31/24 0537 09/01/24 0452 09/02/24 0556 09/02/24 1231 09/03/24 0512  WBC 15.0*   < > 12.5* 17.2* 17.8* 10.6*  --  10.0  NEUTROABS 13.1*  --  9.5* 13.5* 15.0*  --   --   --   HGB 8.1*   < > 8.6* 9.0* 8.2* 7.1* 6.7* 8.6*  HCT 24.5*   < > 25.6* 26.6* 23.8* 21.2*  --  25.7*  MCV 100.4*   < > 99.2 97.4 95.6 97.2  --  96.6  PLT 211   < > 350 437* 363 315  --  330   < > = values in this interval not displayed.    Cardiac Enzymes: No results for  input(s): CKTOTAL, CKMB, CKMBINDEX, TROPONINI in the last 168 hours.  BNP: Invalid input(s): POCBNP  CBG: Recent Labs  Lab 09/02/24 1719 09/02/24 2323 09/03/24 0627 09/03/24 1125 09/03/24 1804  GLUCAP 111* 126* 106* 112* 120*    Microbiology: Results for orders placed or performed during the hospital encounter of 08/24/24  Blood culture (single)     Status: None   Collection Time: 08/24/24  2:07 PM   Specimen: BLOOD  Result Value Ref Range Status   Specimen Description BLOOD RIGHT ANTECUBITAL  Final   Special Requests   Final    BOTTLES DRAWN AEROBIC AND ANAEROBIC Blood Culture adequate volume   Culture   Final    NO GROWTH 5 DAYS Performed at [facility], [address] [place], [place] [address]    Report Status 08/29/2024 FINAL  Final  MRSA Next Gen by PCR, Nasal     Status: None   Collection Time: 08/25/24  4:45 PM   Specimen: Nasal Mucosa; Nasal Swab  Result Value Ref Range Status   MRSA by PCR Next Gen NOT DETECTED NOT DETECTED Final    Comment: (NOTE) The GeneXpert MRSA Assay (FDA approved for NASAL specimens only), is one component of a comprehensive MRSA colonization surveillance program. It is not intended to diagnose MRSA infection nor to guide or monitor treatment for MRSA infections. Test performance is not FDA approved in patients less than 44 years old. Performed at [facility], [address] [place], [place] [address]   MRSA Next Gen by PCR, Nasal     Status: None   Collection Time: 08/31/24  3:02 PM   Specimen: Nasal Mucosa; Nasal Swab  Result Value Ref Range Status   MRSA by PCR Next Gen NOT DETECTED NOT DETECTED Final    Comment: (NOTE) The GeneXpert MRSA Assay (FDA approved for  NASAL specimens only), is one component of a comprehensive MRSA colonization surveillance program. It is not intended to diagnose MRSA infection nor to guide or monitor treatment for MRSA infections. Test performance is not FDA  approved in patients less than 39 years old. Performed at [facility], [address] [place], [place] [address]   Respiratory (~20 pathogens) panel by PCR     Status: None   Collection Time: 09/01/24  1:23 PM   Specimen: Nasopharyngeal Swab; Respiratory  Result Value Ref Range Status   Adenovirus NOT DETECTED NOT DETECTED Final   Coronavirus 229E NOT DETECTED NOT DETECTED Final    Comment: (NOTE) The Coronavirus on the Respiratory Panel, DOES NOT test for the novel  Coronavirus (2019 nCoV)    Coronavirus HKU1 NOT DETECTED NOT DETECTED Final   Coronavirus NL63 NOT DETECTED NOT DETECTED Final   Coronavirus OC43 NOT DETECTED NOT DETECTED Final   Metapneumovirus NOT DETECTED NOT DETECTED Final   Rhinovirus / Enterovirus NOT DETECTED NOT DETECTED Final   Influenza A NOT DETECTED NOT DETECTED Final   Influenza B NOT DETECTED NOT DETECTED Final   Parainfluenza Virus 1 NOT DETECTED NOT DETECTED Final   Parainfluenza Virus 2 NOT DETECTED NOT DETECTED Final   Parainfluenza Virus 3 NOT DETECTED NOT DETECTED Final   Parainfluenza Virus 4 NOT DETECTED NOT DETECTED Final   Respiratory Syncytial Virus NOT DETECTED NOT DETECTED Final   Bordetella pertussis NOT DETECTED NOT DETECTED Final   Bordetella Parapertussis NOT DETECTED NOT DETECTED Final   Chlamydophila pneumoniae NOT DETECTED NOT DETECTED Final   Mycoplasma pneumoniae NOT DETECTED NOT DETECTED Final    Comment: Performed at [facility], [address], [place], [place] [address]    Coagulation Studies: No results for input(s): LABPROT, INR in the last 72 hours.  Urinalysis: No results for input(s): COLORURINE, LABSPEC, PHURINE, GLUCOSEU, HGBUR, BILIRUBINUR, KETONESUR, PROTEINUR, UROBILINOGEN, NITRITE, LEUKOCYTESUR in the last 72 hours.  Invalid input(s): APPERANCEUR    Imaging: DG CHEST PORT 1 VIEW Result Date: 09/03/2024 EXAM: 1 VIEW XRAY OF THE CHEST 09/03/2024 01:33:18 AM  COMPARISON: Comparison with 08/26/2024. CLINICAL HISTORY: Rhonchi. FINDINGS: LUNGS AND PLEURA: Diffuse interstitial coarsening similar to prior. Layering bilateral pleural effusions. Basilar airspace opacities. No pneumothorax. HEART AND MEDIASTINUM: Stable cardiomediastinal silhouette. Aortic atherosclerotic calcification. BONES AND SOFT TISSUES: No acute osseous abnormality. IMPRESSION: 1. Pulmonary edema with bilateral pleural effusions similar to prior. Electronically signed by: Norman Gatlin MD 09/03/2024 01:36 AM EST RP Workstation: [id number]     Medications:    sodium chloride  (hypertonic) 40 mL/hr at 09/03/24 1853    amiodarone   200 mg Oral Daily   amoxicillin -clavulanate  1 tablet Oral Q12H   apixaban   5 mg Oral BID   vitamin C   500 mg Oral BID   aspirin  EC  81 mg Oral Daily   atorvastatin   20 mg Oral Daily   carbidopa -levodopa   2 tablet Oral TID AC   Carbidopa -Levodopa  ER  1 tablet Oral QHS   Chlorhexidine  Gluconate Cloth  6 each Topical Q0600   docusate sodium   100 mg Oral BID   doxycycline   100 mg Oral Q12H   gabapentin   100 mg Oral BID   iron  polysaccharides  150 mg Oral Daily   multivitamin with minerals  1 tablet Oral Daily   nutrition supplement (JUVEN)  1 packet Oral BID BM   sodium bicarbonate   1,300 mg Oral BID   sodium chloride   1 g Oral BID WC   tamsulosin   0.4 mg Oral Daily   acetaminophen  **OR** acetaminophen , metoprolol  tartrate, ondansetron  **OR** ondansetron  (ZOFRAN ) IV, mouth rinse, oxyCODONE , oxyCODONE , polyethylene glycol  Assessment/ Plan:  Mr. Nicolas Solis is a 74 y.o.  male with past medical history of Parkinson's, tobacco and alcohol abuse. He presents to ED with left foot wounds, redness and swelling. Is currently admitted for Cellulitis of left lower extremity [L03.116] Critical limb ischemia of left lower extremity (HCC) [I70.222] Chronic ulcer of left foot, unspecified ulcer stage (HCC) [L97.529]   Acute hyponatremia, serum sodium 131  upon admission and down to 118 at time of initial evaluation Bacterial pneumonia. Acute metabolic acidosis with serum bicarbonate of 19 at time of initial evaluation.  Plan:  Patient continues to respond to 3% saline albeit a bit slowly than expected.  Increase 3% saline to 40 cc/h.  Target sodium by tomorrow will be 132-134.  Patient transition to Augmentin  for treatment of pneumonia.  Acute metabolic acidosis also improved with most recent serum bicarbonate of 21.    LOS: 10 Journie Howson 2/6/20268:31 PM   "

## 2024-09-03 NOTE — Progress Notes (Signed)
 PHARMACY CONSULT NOTE - Hypertonic Saline  Pharmacy Consult for Hypertonic Saline Monitoring and Management  Recent Labs: Potassium (mmol/L)  Date Value  09/03/2024 3.7   Magnesium  (mg/dL)  Date Value  97/93/7973 1.7   Calcium  (mg/dL)  Date Value  97/93/7973 7.6 (L)   Albumin (g/dL)  Date Value  98/69/7973 3.0 (L)   Phosphorus (mg/dL)  Date Value  97/94/7973 2.5   Sodium (mmol/L)  Date Value  09/03/2024 127 (L)   Assessment  Patient is a 74 y/o M with a PMH of CAD and Parkinson's disease admitted with acute limb ischemia, foot cellulitis, new-onset atrial fibrillation, and hyponatremia due to SIADH. Sodium at 119 this morning and trending down to 115 at 1624. MD ordered hypertonic saline upon transfer to the ICU. Pharmacy has been consulted to monitor hypertonic saline (3%) infusion.  Baseline Labs: Serum Na 119, Serum Osm 243, Urine Na 31, Urine Osm 651  Goal of Therapy:  Increase in Na by 4-6 mEq/L in 4-6 hours Do not exceed increase in Na by 10-12 mEq/L in 24 hours  Monitoring:  Date Time Na Rate/Comment  2/3 1845 115 HTS at 30 mL/hr 2/4 0839 119 HTS at 30 mL/hr 2/4       1303    120     HTS at 30 mL/hr 2/4       1902    123     HTS at 30 mL/hr 2/5        0123   121     HTS at 30 mL/hr 2/5        0556   124     HTS at 30 mL/hr 2/5        1231   123     HTS at 30 mL/hr 2/5        1848   127     HTS at 30 mL/hr 2/6   0512  127 HTS at 30 mL/hr; increase to 40 mL/hr per nephrology  Plan:  Increase HTS to 40 mL/hr per nephrology given un-changed rate of Na increase over ~ 12 hours. Plan to continue HTS until Na around ~ 130 Na checks q6h currently ordered  Stop infusion if:  Na increases > 4 mEq/L in first 2 hours Na increases > 6 mEq/L in first 4 hours Na increases > 6 mEq/L in 6 hours  Na increases > 8 mEq/L in 8 hours (give D5W bolus) Continue to monitor for signs of clinical improvement and recommendations per nephrology  Marolyn KATHEE Mare 09/03/2024 10:50  AM

## 2024-09-03 NOTE — Progress Notes (Signed)
 PHARMACY CONSULT NOTE - Hypertonic Saline  Pharmacy Consult for Hypertonic Saline Monitoring and Management  Recent Labs: Potassium (mmol/L)  Date Value  09/03/2024 3.7   Magnesium  (mg/dL)  Date Value  97/93/7973 1.7   Calcium  (mg/dL)  Date Value  97/93/7973 7.6 (L)   Albumin (g/dL)  Date Value  98/69/7973 3.0 (L)   Phosphorus (mg/dL)  Date Value  97/94/7973 2.5   Sodium (mmol/L)  Date Value  09/03/2024 126 (L)   Assessment  Patient is a 74 y/o M with a PMH of CAD and Parkinson's disease admitted with acute limb ischemia, foot cellulitis, new-onset atrial fibrillation, and hyponatremia due to SIADH. Sodium at 119 this morning and trending down to 115 at 1624. MD ordered hypertonic saline upon transfer to the ICU. Pharmacy has been consulted to monitor hypertonic saline (3%) infusion.  Baseline Labs: Serum Na 119, Serum Osm 243, Urine Na 31, Urine Osm 651  Goal of Therapy:  Increase in Na by 4-6 mEq/L in 4-6 hours Do not exceed increase in Na by 10-12 mEq/L in 24 hours  Monitoring:  Date Time Na Rate/Comment  2/3 1845 115 HTS at 30 mL/hr 2/4 0839 119 HTS at 30 mL/hr 2/4       1303    120     HTS at 30 mL/hr 2/4       1902    123     HTS at 30 mL/hr 2/5        0123   121     HTS at 30 mL/hr 2/5        0556   124     HTS at 30 mL/hr 2/5        1231   123     HTS at 30 mL/hr 2/5        1848   127     HTS at 30 mL/hr 2/6   0512  127 HTS at 30 mL/hr; increase to 40 mL/hr per nephrology 2/6  1301 126 HTS at 40 ,mL/hr  Plan:  Continue HTS at 40 mL/hr (Rate increased from 30 ml/hr to 40 ml/hr ~1100). Plan to continue HTS until Na around ~ 130 Na checks q6h currently ordered  Stop infusion if:  Na increases > 4 mEq/L in first 2 hours Na increases > 6 mEq/L in first 4 hours Na increases > 6 mEq/L in 6 hours  Na increases > 8 mEq/L in 8 hours (give D5W bolus) Continue to monitor for signs of clinical improvement and recommendations per nephrology  Lum VEAR Mania 09/03/2024 2:38 PM

## 2024-09-03 NOTE — Plan of Care (Signed)
" °  Problem: Elimination: Goal: Will not experience complications related to bowel motility Outcome: Progressing Goal: Will not experience complications related to urinary retention Outcome: Progressing   Problem: Pain Managment: Goal: General experience of comfort will improve and/or be controlled Outcome: Progressing   Problem: Skin Integrity: Goal: Risk for impaired skin integrity will decrease Outcome: Progressing   Problem: Clinical Measurements: Goal: Ability to maintain clinical measurements within normal limits Outcome: Progressing Goal: Postoperative complications will be avoided or minimized Outcome: Progressing   "

## 2024-09-03 NOTE — Progress Notes (Signed)
 " Progress Note   Patient: Nicolas Solis FMW:[id number] DOB: 1950-08-08 DOA: 08/24/2024     10 DOS: the patient was seen and examined on 09/03/2024   Brief hospital course: From HPI Nicolas Solis is a 74 y.o. male with medical history significant of coronary artery disease, Parkinson's disease presenting to the emergency department for evaluation of worsening pain, swelling, and discoloration of his left foot. He saw Podiatry about two weeks ago and was referred to Vascular Surgery but has not yet seen them. He was in for follow up with PCP today and was referred to the ED given concern for acute limb ischemia.  Patient was placed on heparin ,s/p LEFT SFA/Pop Stenting;TP trunk stenting 1/30.  Patient is also followed by podiatry. Patient had worsening hyponatremia on 2/3, started on 3% sodium chloride .    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  This is thought to be secondary to SIADH.  Appreciate nephrology consult.  Continue monitor sodium closely.  Patient also placed on fluid restriction. 2/5. Sodium level is better, but still low, added salt tablets in addition to 3% sodium chloride .  Sodium level continue to improve to 127.  Can discontinued 3% sodium chloride  when sodium levels above 130, will discuss with nephrology.    Atherosclerosis obliterans with ulceration  Gangrene involving left 4th and 5th toes On heparin  drip for the last 3 days, changed to Eliquis  as heparin  drip has made sodium level much lower with D5 as carrier fluids.  Vascular surgery took patient for angiography on  08/27/2024 Discussed with Dr. Marea, patient large blood vessel has been opened, not anticipating additional workup or treatment. Also discussed with Dr. Tanda, considering possibility of hyperbaric oxygen to improve tissue ischemia.    SIRS Left foot ulceration with surrounding cellulitis  - leukocytosis and tachycardia, otherwise hemodynamically stable Continue current antibiotics Cultures showed no growth to date 2/5. Patient so far has completed 10 days of antibiotics, will change to Augmentin  and doxycycline  for additional 4 days. Infection much better controlled.   New onset A-fib with RVR Patient on heparin  drip Amiodarone  drip have been switched to oral since patient is seen sinus rhythm Echo showing EF 65% with no shunt On Eliquis .   Acute on chronic macrocytic anemia  Appears to be anemia of chronic disease, iron  level is low, ferritin level elevated.  But I added this oral iron .  B12 level elevated. Hemoglobin dropped down to 6.7, no black stool or rectal bleeding noted, received a unit of blood transfusion on 2/5.  Hemoglobin improved to 7.6 today.  Continue to follow.   Multifocal pneumonia. Patient had a CT scan checking for malignancy of the chest, came back with multifocal pneumonia.  Viral panel negative, procalcitonin level 0.24.  Currently on Rocephin  and vancomycin .  Change antibiotics to Augmentin  and doxycycline .  Possible likely this is aspiration pneumonitis. Currently has no overt respiratory symptoms.  Will complete additional 3 days of antibiotics.   Metabolic acidosis. Improving, continue sodium bicarb   Parkinson's disease  Dementia with behavioral disturbance Patient mental status improved.  No longer has agitation   CAD HLD Continue home  statin therapy           Subjective: Patient doing better, slept well last night.  Denies any short of breath.  Physical Exam: Vitals:   09/03/24 0600 09/03/24 0700 09/03/24 0800 09/03/24 0900  BP: (!)  110/55 (!) 98/53 (!) 101/54 130/63  Pulse: 75 68 65 85  Resp: 18 (!) 27 (!) 26 16  Temp:   98.3 F (36.8 C)   TempSrc:   Axillary   SpO2: 99% 100% 100% 96%  Weight:      Height:       General exam: Appears calm and comfortable  Respiratory system: Clear to auscultation. Respiratory effort normal. Cardiovascular system: S1 & S2 heard, RRR. No JVD, murmurs, rubs, gallops or clicks. No pedal edema. Gastrointestinal system: Abdomen is nondistended, soft and nontender. No organomegaly or masses felt. Normal bowel sounds heard. Central nervous system: Alert and oriented x3. No focal neurological deficits. Extremities: Symmetric 5 x 5 power. Skin: No rashes, lesions or ulcers Psychiatry: Judgement and insight appear normal. Mood & affect appropriate.    Data Reviewed:  Lab results reviewed.  Family Communication: Had a long discussion with wife, all questions answered.  Disposition: Status is: Inpatient Remains inpatient appropriate because: Severity of disease, IV treatment     Time spent: 50 minutes  Author: Murvin Mana, MD 09/03/2024 10:52 AM  For on call review [email or web address].    "

## 2024-09-03 NOTE — TOC Progression Note (Signed)
 Transition of Care [facility]) - Progression Note    Patient Details  Name: Nicolas Solis MRN: [id number] Date of Birth: 1951-03-20  [facility] CM/SW Contact  K'La JINNY Ruts, LCSW Phone Number: 09/03/2024, 12:41 PM  Clinical Narrative:    Chart reviewed. Patient wife was informed that [facility] does not have any beds. The patient wife was accepting of the patient going to [facility].   SW spoke with [facility] and she confirmed that they still have a bed for the patient and can accept him on Monday.                      Expected Discharge Plan and Services                                               Social Drivers of Health (SDOH) Interventions SDOH Screenings   Food Insecurity: No Food Insecurity (08/25/2024)  Housing: Low Risk (08/25/2024)  Transportation Needs: No Transportation Needs (08/25/2024)  Utilities: Not At Risk (08/25/2024)  Financial Resource Strain: Low Risk  (08/10/2024)   Received from [facility]  Social Connections: Moderately Isolated (08/25/2024)  Tobacco Use: Medium Risk (08/25/2024)    Readmission Risk Interventions     No data to display

## 2024-09-03 NOTE — Progress Notes (Signed)
 PHARMACY CONSULT NOTE - Hypertonic Saline  Pharmacy Consult for Hypertonic Saline Monitoring and Management  Recent Labs: Potassium (mmol/L)  Date Value  09/03/2024 3.7   Magnesium  (mg/dL)  Date Value  97/93/7973 1.7   Calcium  (mg/dL)  Date Value  97/93/7973 7.6 (L)   Albumin (g/dL)  Date Value  98/69/7973 3.0 (L)   Phosphorus (mg/dL)  Date Value  97/94/7973 2.5   Sodium (mmol/L)  Date Value  09/03/2024 129 (L)   Assessment  Patient is a 74 y/o M with a PMH of CAD and Parkinson's disease admitted with acute limb ischemia, foot cellulitis, new-onset atrial fibrillation, and hyponatremia due to SIADH. Sodium at 119 this morning and trending down to 115 at 1624. MD ordered hypertonic saline upon transfer to the ICU. Pharmacy has been consulted to monitor hypertonic saline (3%) infusion.  Baseline Labs: Serum Na 119, Serum Osm 243, Urine Na 31, Urine Osm 651  Goal of Therapy:  Increase in Na by 4-6 mEq/L in 4-6 hours Do not exceed increase in Na by 10-12 mEq/L in 24 hours  Monitoring:  Date Time Na Rate/Comment  2/3 1845 115 HTS at 30 mL/hr 2/4 0839 119 HTS at 30 mL/hr 2/4       1303    120     HTS at 30 mL/hr 2/4       1902    123     HTS at 30 mL/hr 2/5        0123   121     HTS at 30 mL/hr 2/5        0556   124     HTS at 30 mL/hr 2/5        1231   123     HTS at 30 mL/hr 2/5        1848   127     HTS at 30 mL/hr 2/6   0512  127 HTS at 30 mL/hr; increase to 40 mL/hr per nephrology 2/6  1301 126 HTS at 40 ,mL/hr 2/6        1822   129     HTS at 40 ml/hr  Plan:  Continue HTS at 40 mL/hr (Rate increased from 30 ml/hr to 40 ml/hr ~1100). Plan to continue HTS until Na around ~ 130 Na checks q6h currently ordered  Stop infusion if:  Na increases > 4 mEq/L in first 2 hours Na increases > 6 mEq/L in first 4 hours Na increases > 6 mEq/L in 6 hours  Na increases > 8 mEq/L in 8 hours (give D5W bolus) Continue to monitor for signs of clinical improvement and  recommendations per nephrology  Olam Fritter, PharmD, BCPS 09/03/2024 7:42 PM

## 2024-09-13 ENCOUNTER — Ambulatory Visit: Admitting: Cardiology
